# Patient Record
Sex: Female | Born: 1950 | Race: White | Hispanic: No | Smoking: Former smoker
Health system: Southern US, Community
[De-identification: ages and names within clinical notes are randomized; demographics above are authoritative.]

## PROBLEM LIST (undated history)

## (undated) DIAGNOSIS — R0600 Dyspnea, unspecified: Secondary | ICD-10-CM

## (undated) DIAGNOSIS — I341 Nonrheumatic mitral (valve) prolapse: Secondary | ICD-10-CM

## (undated) DIAGNOSIS — I499 Cardiac arrhythmia, unspecified: Secondary | ICD-10-CM

## (undated) DIAGNOSIS — E785 Hyperlipidemia, unspecified: Secondary | ICD-10-CM

## (undated) DIAGNOSIS — I10 Essential (primary) hypertension: Secondary | ICD-10-CM

## (undated) DIAGNOSIS — G4730 Sleep apnea, unspecified: Secondary | ICD-10-CM

## (undated) DIAGNOSIS — E079 Disorder of thyroid, unspecified: Secondary | ICD-10-CM

## (undated) DIAGNOSIS — M1990 Unspecified osteoarthritis, unspecified site: Secondary | ICD-10-CM

## (undated) DIAGNOSIS — S96919A Strain of unspecified muscle and tendon at ankle and foot level, unspecified foot, initial encounter: Secondary | ICD-10-CM

## (undated) DIAGNOSIS — K759 Inflammatory liver disease, unspecified: Secondary | ICD-10-CM

## (undated) DIAGNOSIS — F329 Major depressive disorder, single episode, unspecified: Secondary | ICD-10-CM

## (undated) DIAGNOSIS — I509 Heart failure, unspecified: Secondary | ICD-10-CM

## (undated) DIAGNOSIS — K219 Gastro-esophageal reflux disease without esophagitis: Secondary | ICD-10-CM

## (undated) DIAGNOSIS — F32A Depression, unspecified: Secondary | ICD-10-CM

## (undated) DIAGNOSIS — M797 Fibromyalgia: Secondary | ICD-10-CM

## (undated) HISTORY — DX: Cardiac arrhythmia, unspecified: I49.9

## (undated) HISTORY — DX: Hyperlipidemia, unspecified: E78.5

## (undated) HISTORY — PX: EYE SURGERY: SHX253

## (undated) HISTORY — DX: Heart failure, unspecified: I50.9

## (undated) HISTORY — PX: CHOLECYSTECTOMY: SHX55

## (undated) HISTORY — DX: Essential (primary) hypertension: I10

## (undated) HISTORY — PX: TUBAL LIGATION: SHX77

## (undated) HISTORY — PX: TONSILLECTOMY: SUR1361

## (undated) HISTORY — PX: SHOULDER ARTHROSCOPY: SHX128

## (undated) HISTORY — PX: JOINT REPLACEMENT: SHX530

---

## 1990-08-29 DIAGNOSIS — M797 Fibromyalgia: Secondary | ICD-10-CM

## 1990-08-29 HISTORY — DX: Fibromyalgia: M79.7

## 1998-08-29 HISTORY — PX: FOOT SURGERY: SHX648

## 2011-11-23 DIAGNOSIS — M1710 Unilateral primary osteoarthritis, unspecified knee: Secondary | ICD-10-CM | POA: Insufficient documentation

## 2012-09-10 DIAGNOSIS — H18519 Endothelial corneal dystrophy, unspecified eye: Secondary | ICD-10-CM | POA: Insufficient documentation

## 2013-11-18 DIAGNOSIS — F32A Depression, unspecified: Secondary | ICD-10-CM | POA: Insufficient documentation

## 2013-11-18 DIAGNOSIS — K219 Gastro-esophageal reflux disease without esophagitis: Secondary | ICD-10-CM | POA: Insufficient documentation

## 2014-05-20 ENCOUNTER — Ambulatory Visit: Payer: Self-pay | Admitting: Family Medicine

## 2014-10-04 ENCOUNTER — Ambulatory Visit: Payer: Self-pay | Admitting: Registered Nurse

## 2014-11-10 DIAGNOSIS — I2510 Atherosclerotic heart disease of native coronary artery without angina pectoris: Secondary | ICD-10-CM | POA: Insufficient documentation

## 2015-04-13 ENCOUNTER — Other Ambulatory Visit (HOSPITAL_COMMUNITY): Payer: Self-pay | Admitting: Nurse Practitioner

## 2015-04-13 DIAGNOSIS — I425 Other restrictive cardiomyopathy: Secondary | ICD-10-CM

## 2015-04-16 ENCOUNTER — Inpatient Hospital Stay (HOSPITAL_COMMUNITY): Admission: RE | Admit: 2015-04-16 | Payer: Self-pay | Source: Ambulatory Visit

## 2015-04-21 ENCOUNTER — Ambulatory Visit (HOSPITAL_COMMUNITY)
Admission: RE | Admit: 2015-04-21 | Discharge: 2015-04-21 | Disposition: A | Discharge status: Home / Self Care | Payer: Self-pay | Source: Ambulatory Visit | Attending: Nurse Practitioner | Admitting: Nurse Practitioner

## 2015-04-27 ENCOUNTER — Ambulatory Visit (HOSPITAL_COMMUNITY)
Admission: RE | Admit: 2015-04-27 | Discharge: 2015-04-27 | Disposition: A | Discharge status: Home / Self Care | Payer: PPO | Source: Ambulatory Visit | Attending: Nurse Practitioner | Admitting: Nurse Practitioner

## 2015-04-27 DIAGNOSIS — R002 Palpitations: Secondary | ICD-10-CM | POA: Insufficient documentation

## 2015-04-27 DIAGNOSIS — R0602 Shortness of breath: Secondary | ICD-10-CM | POA: Insufficient documentation

## 2015-04-27 DIAGNOSIS — I425 Other restrictive cardiomyopathy: Secondary | ICD-10-CM | POA: Diagnosis not present

## 2015-04-27 DIAGNOSIS — I081 Rheumatic disorders of both mitral and tricuspid valves: Secondary | ICD-10-CM | POA: Diagnosis not present

## 2015-04-27 LAB — CREATININE, SERUM
CREATININE: 0.55 mg/dL (ref 0.44–1.00)
GFR calc non Af Amer: >60 mL/min (ref >60)

## 2015-04-27 MED ORDER — GADOBENATE DIMEGLUMINE 529 MG/ML IV SOLN
35.0000 mL | Freq: Once | INTRAVENOUS | Status: AC | PRN
  Administered 2015-04-27: 35 mL via INTRAVENOUS

## 2015-06-09 ENCOUNTER — Ambulatory Visit: Payer: PPO | Attending: Specialist

## 2015-06-09 DIAGNOSIS — G4733 Obstructive sleep apnea (adult) (pediatric): Secondary | ICD-10-CM | POA: Diagnosis present

## 2015-06-09 DIAGNOSIS — G4761 Periodic limb movement disorder: Secondary | ICD-10-CM | POA: Insufficient documentation

## 2015-11-04 DIAGNOSIS — I2510 Atherosclerotic heart disease of native coronary artery without angina pectoris: Secondary | ICD-10-CM | POA: Diagnosis not present

## 2015-11-04 DIAGNOSIS — I6523 Occlusion and stenosis of bilateral carotid arteries: Secondary | ICD-10-CM | POA: Diagnosis not present

## 2015-11-04 DIAGNOSIS — G4733 Obstructive sleep apnea (adult) (pediatric): Secondary | ICD-10-CM | POA: Diagnosis not present

## 2015-11-04 DIAGNOSIS — E782 Mixed hyperlipidemia: Secondary | ICD-10-CM | POA: Diagnosis not present

## 2015-11-16 DIAGNOSIS — I6523 Occlusion and stenosis of bilateral carotid arteries: Secondary | ICD-10-CM | POA: Diagnosis not present

## 2015-12-02 DIAGNOSIS — I341 Nonrheumatic mitral (valve) prolapse: Secondary | ICD-10-CM | POA: Diagnosis not present

## 2015-12-02 DIAGNOSIS — G4733 Obstructive sleep apnea (adult) (pediatric): Secondary | ICD-10-CM | POA: Diagnosis not present

## 2015-12-02 DIAGNOSIS — I6523 Occlusion and stenosis of bilateral carotid arteries: Secondary | ICD-10-CM | POA: Diagnosis not present

## 2015-12-02 DIAGNOSIS — R0602 Shortness of breath: Secondary | ICD-10-CM | POA: Diagnosis not present

## 2015-12-02 DIAGNOSIS — R002 Palpitations: Secondary | ICD-10-CM | POA: Diagnosis not present

## 2015-12-02 DIAGNOSIS — I10 Essential (primary) hypertension: Secondary | ICD-10-CM | POA: Diagnosis not present

## 2015-12-02 DIAGNOSIS — I2510 Atherosclerotic heart disease of native coronary artery without angina pectoris: Secondary | ICD-10-CM | POA: Diagnosis not present

## 2015-12-02 DIAGNOSIS — E782 Mixed hyperlipidemia: Secondary | ICD-10-CM | POA: Diagnosis not present

## 2015-12-02 DIAGNOSIS — K219 Gastro-esophageal reflux disease without esophagitis: Secondary | ICD-10-CM | POA: Diagnosis not present

## 2015-12-08 DIAGNOSIS — I272 Other secondary pulmonary hypertension: Secondary | ICD-10-CM | POA: Diagnosis not present

## 2015-12-08 DIAGNOSIS — J449 Chronic obstructive pulmonary disease, unspecified: Secondary | ICD-10-CM | POA: Diagnosis not present

## 2015-12-08 DIAGNOSIS — R0609 Other forms of dyspnea: Secondary | ICD-10-CM | POA: Diagnosis not present

## 2015-12-08 DIAGNOSIS — G4733 Obstructive sleep apnea (adult) (pediatric): Secondary | ICD-10-CM | POA: Diagnosis not present

## 2016-02-16 DIAGNOSIS — E785 Hyperlipidemia, unspecified: Secondary | ICD-10-CM | POA: Diagnosis not present

## 2016-02-16 DIAGNOSIS — I10 Essential (primary) hypertension: Secondary | ICD-10-CM | POA: Diagnosis not present

## 2016-02-16 DIAGNOSIS — E039 Hypothyroidism, unspecified: Secondary | ICD-10-CM | POA: Diagnosis not present

## 2016-02-16 DIAGNOSIS — E559 Vitamin D deficiency, unspecified: Secondary | ICD-10-CM | POA: Diagnosis not present

## 2016-02-17 DIAGNOSIS — E559 Vitamin D deficiency, unspecified: Secondary | ICD-10-CM | POA: Diagnosis not present

## 2016-02-17 DIAGNOSIS — E039 Hypothyroidism, unspecified: Secondary | ICD-10-CM | POA: Diagnosis not present

## 2016-02-17 DIAGNOSIS — E785 Hyperlipidemia, unspecified: Secondary | ICD-10-CM | POA: Diagnosis not present

## 2016-02-17 DIAGNOSIS — I10 Essential (primary) hypertension: Secondary | ICD-10-CM | POA: Diagnosis not present

## 2016-03-24 DIAGNOSIS — M1711 Unilateral primary osteoarthritis, right knee: Secondary | ICD-10-CM | POA: Diagnosis not present

## 2016-03-24 DIAGNOSIS — Z09 Encounter for follow-up examination after completed treatment for conditions other than malignant neoplasm: Secondary | ICD-10-CM | POA: Diagnosis not present

## 2016-03-24 DIAGNOSIS — Z96652 Presence of left artificial knee joint: Secondary | ICD-10-CM | POA: Diagnosis not present

## 2016-03-24 DIAGNOSIS — Z96659 Presence of unspecified artificial knee joint: Secondary | ICD-10-CM | POA: Diagnosis not present

## 2016-05-04 DIAGNOSIS — G4733 Obstructive sleep apnea (adult) (pediatric): Secondary | ICD-10-CM | POA: Diagnosis not present

## 2016-05-04 DIAGNOSIS — R001 Bradycardia, unspecified: Secondary | ICD-10-CM | POA: Diagnosis not present

## 2016-05-04 DIAGNOSIS — I6523 Occlusion and stenosis of bilateral carotid arteries: Secondary | ICD-10-CM | POA: Diagnosis not present

## 2016-05-04 DIAGNOSIS — R079 Chest pain, unspecified: Secondary | ICD-10-CM | POA: Diagnosis not present

## 2016-05-04 DIAGNOSIS — I341 Nonrheumatic mitral (valve) prolapse: Secondary | ICD-10-CM | POA: Diagnosis not present

## 2016-05-04 DIAGNOSIS — I10 Essential (primary) hypertension: Secondary | ICD-10-CM | POA: Diagnosis not present

## 2016-05-04 DIAGNOSIS — K219 Gastro-esophageal reflux disease without esophagitis: Secondary | ICD-10-CM | POA: Diagnosis not present

## 2016-05-04 DIAGNOSIS — I2510 Atherosclerotic heart disease of native coronary artery without angina pectoris: Secondary | ICD-10-CM | POA: Diagnosis not present

## 2016-05-04 DIAGNOSIS — E782 Mixed hyperlipidemia: Secondary | ICD-10-CM | POA: Diagnosis not present

## 2016-06-01 DIAGNOSIS — I6523 Occlusion and stenosis of bilateral carotid arteries: Secondary | ICD-10-CM | POA: Diagnosis not present

## 2016-06-01 DIAGNOSIS — E782 Mixed hyperlipidemia: Secondary | ICD-10-CM | POA: Diagnosis not present

## 2016-06-01 DIAGNOSIS — G4733 Obstructive sleep apnea (adult) (pediatric): Secondary | ICD-10-CM | POA: Diagnosis not present

## 2016-06-01 DIAGNOSIS — R5382 Chronic fatigue, unspecified: Secondary | ICD-10-CM | POA: Diagnosis not present

## 2016-06-01 DIAGNOSIS — I10 Essential (primary) hypertension: Secondary | ICD-10-CM | POA: Diagnosis not present

## 2016-06-01 DIAGNOSIS — I2510 Atherosclerotic heart disease of native coronary artery without angina pectoris: Secondary | ICD-10-CM | POA: Diagnosis not present

## 2016-06-01 DIAGNOSIS — K219 Gastro-esophageal reflux disease without esophagitis: Secondary | ICD-10-CM | POA: Diagnosis not present

## 2016-06-01 DIAGNOSIS — R002 Palpitations: Secondary | ICD-10-CM | POA: Diagnosis not present

## 2016-06-01 DIAGNOSIS — I341 Nonrheumatic mitral (valve) prolapse: Secondary | ICD-10-CM | POA: Diagnosis not present

## 2016-08-01 DIAGNOSIS — M19071 Primary osteoarthritis, right ankle and foot: Secondary | ICD-10-CM | POA: Diagnosis not present

## 2016-08-01 DIAGNOSIS — M7661 Achilles tendinitis, right leg: Secondary | ICD-10-CM | POA: Diagnosis not present

## 2016-08-16 DIAGNOSIS — R0602 Shortness of breath: Secondary | ICD-10-CM | POA: Diagnosis not present

## 2016-08-16 DIAGNOSIS — J309 Allergic rhinitis, unspecified: Secondary | ICD-10-CM | POA: Diagnosis not present

## 2016-08-16 DIAGNOSIS — Z91018 Allergy to other foods: Secondary | ICD-10-CM | POA: Diagnosis not present

## 2016-08-16 DIAGNOSIS — L299 Pruritus, unspecified: Secondary | ICD-10-CM | POA: Diagnosis not present

## 2016-09-07 DIAGNOSIS — I10 Essential (primary) hypertension: Secondary | ICD-10-CM | POA: Diagnosis not present

## 2016-09-07 DIAGNOSIS — I341 Nonrheumatic mitral (valve) prolapse: Secondary | ICD-10-CM | POA: Diagnosis not present

## 2016-09-07 DIAGNOSIS — R0602 Shortness of breath: Secondary | ICD-10-CM | POA: Diagnosis not present

## 2016-09-07 DIAGNOSIS — K219 Gastro-esophageal reflux disease without esophagitis: Secondary | ICD-10-CM | POA: Diagnosis not present

## 2016-09-07 DIAGNOSIS — R5382 Chronic fatigue, unspecified: Secondary | ICD-10-CM | POA: Diagnosis not present

## 2016-09-07 DIAGNOSIS — I2510 Atherosclerotic heart disease of native coronary artery without angina pectoris: Secondary | ICD-10-CM | POA: Diagnosis not present

## 2016-09-07 DIAGNOSIS — E782 Mixed hyperlipidemia: Secondary | ICD-10-CM | POA: Diagnosis not present

## 2016-09-07 DIAGNOSIS — I6523 Occlusion and stenosis of bilateral carotid arteries: Secondary | ICD-10-CM | POA: Diagnosis not present

## 2016-09-07 DIAGNOSIS — R002 Palpitations: Secondary | ICD-10-CM | POA: Diagnosis not present

## 2016-10-10 DIAGNOSIS — I6523 Occlusion and stenosis of bilateral carotid arteries: Secondary | ICD-10-CM | POA: Diagnosis not present

## 2016-10-10 DIAGNOSIS — E782 Mixed hyperlipidemia: Secondary | ICD-10-CM | POA: Diagnosis not present

## 2016-10-10 DIAGNOSIS — K219 Gastro-esophageal reflux disease without esophagitis: Secondary | ICD-10-CM | POA: Diagnosis not present

## 2016-10-10 DIAGNOSIS — G4733 Obstructive sleep apnea (adult) (pediatric): Secondary | ICD-10-CM | POA: Diagnosis not present

## 2016-10-10 DIAGNOSIS — R002 Palpitations: Secondary | ICD-10-CM | POA: Diagnosis not present

## 2016-10-10 DIAGNOSIS — I2510 Atherosclerotic heart disease of native coronary artery without angina pectoris: Secondary | ICD-10-CM | POA: Diagnosis not present

## 2016-10-10 DIAGNOSIS — I341 Nonrheumatic mitral (valve) prolapse: Secondary | ICD-10-CM | POA: Diagnosis not present

## 2016-10-10 DIAGNOSIS — I10 Essential (primary) hypertension: Secondary | ICD-10-CM | POA: Diagnosis not present

## 2016-10-10 DIAGNOSIS — R0602 Shortness of breath: Secondary | ICD-10-CM | POA: Diagnosis not present

## 2016-10-13 DIAGNOSIS — G4733 Obstructive sleep apnea (adult) (pediatric): Secondary | ICD-10-CM | POA: Diagnosis not present

## 2016-10-21 DIAGNOSIS — M79671 Pain in right foot: Secondary | ICD-10-CM | POA: Diagnosis not present

## 2016-10-31 DIAGNOSIS — M79605 Pain in left leg: Secondary | ICD-10-CM | POA: Diagnosis not present

## 2016-11-08 DIAGNOSIS — M9904 Segmental and somatic dysfunction of sacral region: Secondary | ICD-10-CM | POA: Diagnosis not present

## 2016-11-08 DIAGNOSIS — M4126 Other idiopathic scoliosis, lumbar region: Secondary | ICD-10-CM | POA: Diagnosis not present

## 2016-11-08 DIAGNOSIS — M5432 Sciatica, left side: Secondary | ICD-10-CM | POA: Diagnosis not present

## 2016-11-08 DIAGNOSIS — M62830 Muscle spasm of back: Secondary | ICD-10-CM | POA: Diagnosis not present

## 2016-11-08 DIAGNOSIS — M9903 Segmental and somatic dysfunction of lumbar region: Secondary | ICD-10-CM | POA: Diagnosis not present

## 2016-11-10 DIAGNOSIS — M9903 Segmental and somatic dysfunction of lumbar region: Secondary | ICD-10-CM | POA: Diagnosis not present

## 2016-11-10 DIAGNOSIS — M5432 Sciatica, left side: Secondary | ICD-10-CM | POA: Diagnosis not present

## 2016-11-10 DIAGNOSIS — M9904 Segmental and somatic dysfunction of sacral region: Secondary | ICD-10-CM | POA: Diagnosis not present

## 2016-11-10 DIAGNOSIS — M62830 Muscle spasm of back: Secondary | ICD-10-CM | POA: Diagnosis not present

## 2016-11-10 DIAGNOSIS — M4126 Other idiopathic scoliosis, lumbar region: Secondary | ICD-10-CM | POA: Diagnosis not present

## 2016-11-14 DIAGNOSIS — M9904 Segmental and somatic dysfunction of sacral region: Secondary | ICD-10-CM | POA: Diagnosis not present

## 2016-11-14 DIAGNOSIS — M62830 Muscle spasm of back: Secondary | ICD-10-CM | POA: Diagnosis not present

## 2016-11-14 DIAGNOSIS — M5432 Sciatica, left side: Secondary | ICD-10-CM | POA: Diagnosis not present

## 2016-11-14 DIAGNOSIS — M4126 Other idiopathic scoliosis, lumbar region: Secondary | ICD-10-CM | POA: Diagnosis not present

## 2016-11-14 DIAGNOSIS — M9903 Segmental and somatic dysfunction of lumbar region: Secondary | ICD-10-CM | POA: Diagnosis not present

## 2016-11-16 DIAGNOSIS — M9904 Segmental and somatic dysfunction of sacral region: Secondary | ICD-10-CM | POA: Diagnosis not present

## 2016-11-16 DIAGNOSIS — M5432 Sciatica, left side: Secondary | ICD-10-CM | POA: Diagnosis not present

## 2016-11-16 DIAGNOSIS — M9903 Segmental and somatic dysfunction of lumbar region: Secondary | ICD-10-CM | POA: Diagnosis not present

## 2016-11-16 DIAGNOSIS — M4126 Other idiopathic scoliosis, lumbar region: Secondary | ICD-10-CM | POA: Diagnosis not present

## 2016-11-16 DIAGNOSIS — M62830 Muscle spasm of back: Secondary | ICD-10-CM | POA: Diagnosis not present

## 2016-11-17 DIAGNOSIS — M9903 Segmental and somatic dysfunction of lumbar region: Secondary | ICD-10-CM | POA: Diagnosis not present

## 2016-11-17 DIAGNOSIS — M9904 Segmental and somatic dysfunction of sacral region: Secondary | ICD-10-CM | POA: Diagnosis not present

## 2016-11-17 DIAGNOSIS — M4126 Other idiopathic scoliosis, lumbar region: Secondary | ICD-10-CM | POA: Diagnosis not present

## 2016-11-17 DIAGNOSIS — M62830 Muscle spasm of back: Secondary | ICD-10-CM | POA: Diagnosis not present

## 2016-11-17 DIAGNOSIS — M5432 Sciatica, left side: Secondary | ICD-10-CM | POA: Diagnosis not present

## 2016-11-21 DIAGNOSIS — M5432 Sciatica, left side: Secondary | ICD-10-CM | POA: Diagnosis not present

## 2016-11-21 DIAGNOSIS — M9904 Segmental and somatic dysfunction of sacral region: Secondary | ICD-10-CM | POA: Diagnosis not present

## 2016-11-21 DIAGNOSIS — M62830 Muscle spasm of back: Secondary | ICD-10-CM | POA: Diagnosis not present

## 2016-11-21 DIAGNOSIS — M4126 Other idiopathic scoliosis, lumbar region: Secondary | ICD-10-CM | POA: Diagnosis not present

## 2016-11-21 DIAGNOSIS — M9903 Segmental and somatic dysfunction of lumbar region: Secondary | ICD-10-CM | POA: Diagnosis not present

## 2016-11-23 DIAGNOSIS — M62830 Muscle spasm of back: Secondary | ICD-10-CM | POA: Diagnosis not present

## 2016-11-23 DIAGNOSIS — M4126 Other idiopathic scoliosis, lumbar region: Secondary | ICD-10-CM | POA: Diagnosis not present

## 2016-11-23 DIAGNOSIS — M9903 Segmental and somatic dysfunction of lumbar region: Secondary | ICD-10-CM | POA: Diagnosis not present

## 2016-11-23 DIAGNOSIS — M9902 Segmental and somatic dysfunction of thoracic region: Secondary | ICD-10-CM | POA: Diagnosis not present

## 2016-11-23 DIAGNOSIS — M9904 Segmental and somatic dysfunction of sacral region: Secondary | ICD-10-CM | POA: Diagnosis not present

## 2016-11-23 DIAGNOSIS — M5432 Sciatica, left side: Secondary | ICD-10-CM | POA: Diagnosis not present

## 2016-11-25 DIAGNOSIS — M4126 Other idiopathic scoliosis, lumbar region: Secondary | ICD-10-CM | POA: Diagnosis not present

## 2016-11-25 DIAGNOSIS — M9903 Segmental and somatic dysfunction of lumbar region: Secondary | ICD-10-CM | POA: Diagnosis not present

## 2016-11-25 DIAGNOSIS — M9904 Segmental and somatic dysfunction of sacral region: Secondary | ICD-10-CM | POA: Diagnosis not present

## 2016-11-25 DIAGNOSIS — M5432 Sciatica, left side: Secondary | ICD-10-CM | POA: Diagnosis not present

## 2016-11-25 DIAGNOSIS — M62830 Muscle spasm of back: Secondary | ICD-10-CM | POA: Diagnosis not present

## 2016-11-29 DIAGNOSIS — M4126 Other idiopathic scoliosis, lumbar region: Secondary | ICD-10-CM | POA: Diagnosis not present

## 2016-11-29 DIAGNOSIS — M5432 Sciatica, left side: Secondary | ICD-10-CM | POA: Diagnosis not present

## 2016-11-29 DIAGNOSIS — M62830 Muscle spasm of back: Secondary | ICD-10-CM | POA: Diagnosis not present

## 2016-11-29 DIAGNOSIS — M9904 Segmental and somatic dysfunction of sacral region: Secondary | ICD-10-CM | POA: Diagnosis not present

## 2016-11-29 DIAGNOSIS — M9903 Segmental and somatic dysfunction of lumbar region: Secondary | ICD-10-CM | POA: Diagnosis not present

## 2016-12-15 DIAGNOSIS — M9904 Segmental and somatic dysfunction of sacral region: Secondary | ICD-10-CM | POA: Diagnosis not present

## 2016-12-15 DIAGNOSIS — M9903 Segmental and somatic dysfunction of lumbar region: Secondary | ICD-10-CM | POA: Diagnosis not present

## 2016-12-15 DIAGNOSIS — M5432 Sciatica, left side: Secondary | ICD-10-CM | POA: Diagnosis not present

## 2016-12-15 DIAGNOSIS — M62830 Muscle spasm of back: Secondary | ICD-10-CM | POA: Diagnosis not present

## 2016-12-15 DIAGNOSIS — M4126 Other idiopathic scoliosis, lumbar region: Secondary | ICD-10-CM | POA: Diagnosis not present

## 2016-12-22 DIAGNOSIS — M9903 Segmental and somatic dysfunction of lumbar region: Secondary | ICD-10-CM | POA: Diagnosis not present

## 2016-12-22 DIAGNOSIS — M9904 Segmental and somatic dysfunction of sacral region: Secondary | ICD-10-CM | POA: Diagnosis not present

## 2016-12-22 DIAGNOSIS — M5432 Sciatica, left side: Secondary | ICD-10-CM | POA: Diagnosis not present

## 2016-12-22 DIAGNOSIS — M62830 Muscle spasm of back: Secondary | ICD-10-CM | POA: Diagnosis not present

## 2016-12-22 DIAGNOSIS — M4126 Other idiopathic scoliosis, lumbar region: Secondary | ICD-10-CM | POA: Diagnosis not present

## 2017-02-02 DIAGNOSIS — M5432 Sciatica, left side: Secondary | ICD-10-CM | POA: Diagnosis not present

## 2017-02-02 DIAGNOSIS — M4126 Other idiopathic scoliosis, lumbar region: Secondary | ICD-10-CM | POA: Diagnosis not present

## 2017-02-02 DIAGNOSIS — M62830 Muscle spasm of back: Secondary | ICD-10-CM | POA: Diagnosis not present

## 2017-02-02 DIAGNOSIS — M9903 Segmental and somatic dysfunction of lumbar region: Secondary | ICD-10-CM | POA: Diagnosis not present

## 2017-02-02 DIAGNOSIS — M9904 Segmental and somatic dysfunction of sacral region: Secondary | ICD-10-CM | POA: Diagnosis not present

## 2017-02-15 DIAGNOSIS — M9903 Segmental and somatic dysfunction of lumbar region: Secondary | ICD-10-CM | POA: Diagnosis not present

## 2017-02-15 DIAGNOSIS — M4126 Other idiopathic scoliosis, lumbar region: Secondary | ICD-10-CM | POA: Diagnosis not present

## 2017-02-15 DIAGNOSIS — M62830 Muscle spasm of back: Secondary | ICD-10-CM | POA: Diagnosis not present

## 2017-02-15 DIAGNOSIS — M5432 Sciatica, left side: Secondary | ICD-10-CM | POA: Diagnosis not present

## 2017-02-15 DIAGNOSIS — M9904 Segmental and somatic dysfunction of sacral region: Secondary | ICD-10-CM | POA: Diagnosis not present

## 2017-02-17 DIAGNOSIS — R197 Diarrhea, unspecified: Secondary | ICD-10-CM | POA: Diagnosis not present

## 2017-02-17 DIAGNOSIS — K602 Anal fissure, unspecified: Secondary | ICD-10-CM | POA: Diagnosis not present

## 2017-02-20 DIAGNOSIS — R197 Diarrhea, unspecified: Secondary | ICD-10-CM | POA: Diagnosis not present

## 2017-04-07 ENCOUNTER — Other Ambulatory Visit: Payer: Self-pay | Admitting: Otolaryngology

## 2017-04-07 DIAGNOSIS — E049 Nontoxic goiter, unspecified: Secondary | ICD-10-CM

## 2017-04-07 DIAGNOSIS — H6121 Impacted cerumen, right ear: Secondary | ICD-10-CM | POA: Diagnosis not present

## 2017-04-07 DIAGNOSIS — E041 Nontoxic single thyroid nodule: Secondary | ICD-10-CM | POA: Diagnosis not present

## 2017-04-13 ENCOUNTER — Ambulatory Visit
Admission: RE | Admit: 2017-04-13 | Discharge: 2017-04-13 | Disposition: A | Discharge status: Home / Self Care | Payer: PPO | Source: Ambulatory Visit | Attending: Otolaryngology | Admitting: Otolaryngology

## 2017-04-13 DIAGNOSIS — E049 Nontoxic goiter, unspecified: Secondary | ICD-10-CM | POA: Diagnosis present

## 2017-04-13 DIAGNOSIS — E042 Nontoxic multinodular goiter: Secondary | ICD-10-CM | POA: Diagnosis not present

## 2017-04-25 DIAGNOSIS — E041 Nontoxic single thyroid nodule: Secondary | ICD-10-CM | POA: Diagnosis not present

## 2017-04-26 ENCOUNTER — Other Ambulatory Visit: Payer: Self-pay | Admitting: Otolaryngology

## 2017-04-26 DIAGNOSIS — E041 Nontoxic single thyroid nodule: Secondary | ICD-10-CM

## 2017-04-27 ENCOUNTER — Ambulatory Visit: Admission: RE | Admit: 2017-04-27 | Payer: PPO | Source: Ambulatory Visit

## 2017-05-04 ENCOUNTER — Ambulatory Visit
Admission: RE | Admit: 2017-05-04 | Discharge: 2017-05-04 | Disposition: A | Discharge status: Home / Self Care | Payer: PPO | Source: Ambulatory Visit | Attending: Otolaryngology | Admitting: Otolaryngology

## 2017-05-04 ENCOUNTER — Other Ambulatory Visit: Payer: Self-pay | Admitting: Otolaryngology

## 2017-05-04 DIAGNOSIS — E041 Nontoxic single thyroid nodule: Secondary | ICD-10-CM

## 2017-05-04 HISTORY — DX: Gastro-esophageal reflux disease without esophagitis: K21.9

## 2017-05-04 HISTORY — DX: Disorder of thyroid, unspecified: E07.9

## 2017-05-04 NOTE — Procedures (Signed)
4cm LEFT MID THYROID TR3 NODULE  S/P [place] FNA  NO COMP STABLE PATH PENDING FULL REPORT IN PACS

## 2017-05-05 LAB — CYTOLOGY - NON PAP

## 2017-05-08 DIAGNOSIS — E041 Nontoxic single thyroid nodule: Secondary | ICD-10-CM | POA: Diagnosis not present

## 2017-05-08 DIAGNOSIS — K219 Gastro-esophageal reflux disease without esophagitis: Secondary | ICD-10-CM | POA: Diagnosis not present

## 2017-05-23 DIAGNOSIS — G4733 Obstructive sleep apnea (adult) (pediatric): Secondary | ICD-10-CM | POA: Diagnosis not present

## 2017-05-23 DIAGNOSIS — R0602 Shortness of breath: Secondary | ICD-10-CM | POA: Diagnosis not present

## 2017-05-23 DIAGNOSIS — I2510 Atherosclerotic heart disease of native coronary artery without angina pectoris: Secondary | ICD-10-CM | POA: Diagnosis not present

## 2017-05-23 DIAGNOSIS — I6523 Occlusion and stenosis of bilateral carotid arteries: Secondary | ICD-10-CM | POA: Diagnosis not present

## 2017-05-23 DIAGNOSIS — I10 Essential (primary) hypertension: Secondary | ICD-10-CM | POA: Diagnosis not present

## 2017-05-23 DIAGNOSIS — E782 Mixed hyperlipidemia: Secondary | ICD-10-CM | POA: Diagnosis not present

## 2017-05-31 DIAGNOSIS — H52223 Regular astigmatism, bilateral: Secondary | ICD-10-CM | POA: Diagnosis not present

## 2017-05-31 DIAGNOSIS — H524 Presbyopia: Secondary | ICD-10-CM | POA: Diagnosis not present

## 2017-05-31 DIAGNOSIS — H26493 Other secondary cataract, bilateral: Secondary | ICD-10-CM | POA: Diagnosis not present

## 2017-05-31 DIAGNOSIS — H35363 Drusen (degenerative) of macula, bilateral: Secondary | ICD-10-CM | POA: Diagnosis not present

## 2017-05-31 DIAGNOSIS — H3530 Unspecified macular degeneration: Secondary | ICD-10-CM | POA: Diagnosis not present

## 2017-05-31 DIAGNOSIS — H5213 Myopia, bilateral: Secondary | ICD-10-CM | POA: Diagnosis not present

## 2017-06-01 DIAGNOSIS — G4733 Obstructive sleep apnea (adult) (pediatric): Secondary | ICD-10-CM | POA: Diagnosis not present

## 2017-06-01 DIAGNOSIS — I272 Pulmonary hypertension, unspecified: Secondary | ICD-10-CM | POA: Diagnosis not present

## 2017-06-01 DIAGNOSIS — R0609 Other forms of dyspnea: Secondary | ICD-10-CM | POA: Diagnosis not present

## 2017-06-01 DIAGNOSIS — R0602 Shortness of breath: Secondary | ICD-10-CM | POA: Diagnosis not present

## 2017-06-01 DIAGNOSIS — Z01818 Encounter for other preprocedural examination: Secondary | ICD-10-CM | POA: Diagnosis not present

## 2017-06-05 DIAGNOSIS — I272 Pulmonary hypertension, unspecified: Secondary | ICD-10-CM | POA: Diagnosis not present

## 2017-06-05 DIAGNOSIS — R0609 Other forms of dyspnea: Secondary | ICD-10-CM | POA: Diagnosis not present

## 2017-06-05 DIAGNOSIS — Z01818 Encounter for other preprocedural examination: Secondary | ICD-10-CM | POA: Diagnosis not present

## 2017-06-08 ENCOUNTER — Other Ambulatory Visit: Payer: PPO

## 2017-06-08 ENCOUNTER — Encounter
Admission: RE | Admit: 2017-06-08 | Discharge: 2017-06-08 | Disposition: A | Discharge status: Home / Self Care | Payer: PPO | Source: Ambulatory Visit | Attending: Otolaryngology | Admitting: Otolaryngology

## 2017-06-08 DIAGNOSIS — K219 Gastro-esophageal reflux disease without esophagitis: Secondary | ICD-10-CM | POA: Insufficient documentation

## 2017-06-08 DIAGNOSIS — I341 Nonrheumatic mitral (valve) prolapse: Secondary | ICD-10-CM | POA: Diagnosis not present

## 2017-06-08 DIAGNOSIS — Z01812 Encounter for preprocedural laboratory examination: Secondary | ICD-10-CM | POA: Diagnosis not present

## 2017-06-08 DIAGNOSIS — E079 Disorder of thyroid, unspecified: Secondary | ICD-10-CM | POA: Insufficient documentation

## 2017-06-08 HISTORY — DX: Nonrheumatic mitral (valve) prolapse: I34.1

## 2017-06-08 HISTORY — DX: Unspecified osteoarthritis, unspecified site: M19.90

## 2017-06-08 HISTORY — DX: Cardiac arrhythmia, unspecified: I49.9

## 2017-06-08 HISTORY — DX: Depression, unspecified: F32.A

## 2017-06-08 HISTORY — DX: Inflammatory liver disease, unspecified: K75.9

## 2017-06-08 HISTORY — DX: Dyspnea, unspecified: R06.00

## 2017-06-08 HISTORY — DX: Sleep apnea, unspecified: G47.30

## 2017-06-08 HISTORY — DX: Major depressive disorder, single episode, unspecified: F32.9

## 2017-06-08 LAB — BASIC METABOLIC PANEL
ANION GAP: 6 (ref 5–15)
BUN: 14 mg/dL (ref 6–20)
CALCIUM: 10 mg/dL (ref 8.9–10.3)
CO2: 30 mmol/L (ref 22–32)
CREATININE: 0.57 mg/dL (ref 0.44–1.00)
Chloride: 106 mmol/L (ref 101–111)
GFR calc Af Amer: >60 mL/min (ref >60)
GFR calc non Af Amer: >60 mL/min (ref >60)
GLUCOSE: 100 mg/dL — AB (ref 65–99)
Potassium: 4.5 mmol/L (ref 3.5–5.1)
Sodium: 142 mmol/L (ref 135–145)

## 2017-06-08 NOTE — Patient Instructions (Signed)
Your procedure is scheduled on: Thurs. 06/15/17 Report to Day Surgery. To find out your arrival time please call [phone] between 1PM - 3PM on Wed. 06/14/17.  Remember: Instructions that are not followed completely may result in serious medical risk, up to and including death, or upon the discretion of your surgeon and anesthesiologist your surgery may need to be rescheduled.     _X__ 1. Do not eat food after midnight the night before your procedure.                 No gum chewing or hard candies. You may drink clear liquids up to 2 hours                 before you are scheduled to arrive for your surgery- DO not drink clear                 liquids within 2 hours of the start of your surgery.                 Clear Liquids include:  water, apple juice without pulp, clear carbohydrate                 drink such as Clearfast of Gartorade, Black Coffee or Tea (Do not add                 anything to coffee or tea).     _X__ 2.  No Alcohol for 24 hours before or after surgery.   __ 3.  Do Not Smoke or use e-cigarettes For 24 Hours Prior to Your Surgery.                 Do not use any chewable tobacco products for at least 6 hours prior to                 surgery.  ____  4.  Bring all medications with you on the day of surgery if instructed.   ____  5.  Notify your doctor if there is any change in your medical condition      (cold, fever, infections).     Do not wear jewelry, make-up, hairpins, clips or nail polish. Do not wear lotions, powders, or perfumes. You may wear deodorant. Do not shave 48 hours prior to surgery. Men may shave face and neck. Do not bring valuables to the hospital.    [facility] is not responsible for any belongings or valuables.  Contacts, dentures or bridgework may not be worn into surgery. Leave your suitcase in the car. After surgery it may be brought to your room. For patients admitted to the hospital, discharge time is determined  by your treatment team.   Patients discharged the day of surgery will not be allowed to drive home.   Please read over the following fact sheets that you were given:    __x__ Take these medicines the morning of surgery with A SIP OF WATER:    1.atorvastatin (LIPITOR) 10 MG tablet  2.buPROPion (WELLBUTRIN XL) 150 MG 24 hr tablet   3.levothyroxine (SYNTHROID, LEVOTHROID) 75 MCG tablet  4.ranitidine (ZANTAC) 150 MG tablet  5.  6.  ____ Fleet Enema (as directed)   __x__ Use CHG Soap as directed  ____ Use inhalers on the day of surgery  ____ Stop metformin 2 days prior to surgery    ____ Take 1/2 of usual insulin dose the night before surgery. No insulin the morning  of surgery.   __x__ Stop aspirin on today  __x__ Stop Anti-inflammatories on today naproxen sodium (ANAPROX) 220 MG tablet   __x__ Stop supplements until after surgery.  vitamin E (VITAMIN E) 400 UNIT capsule,KRILL OIL PO  __x__ Bring C-Pap to the hospital.

## 2017-08-01 DIAGNOSIS — E041 Nontoxic single thyroid nodule: Secondary | ICD-10-CM | POA: Diagnosis not present

## 2017-08-03 ENCOUNTER — Observation Stay
Admission: RE | Admit: 2017-08-03 | Discharge: 2017-08-04 | Disposition: A | Discharge status: Home / Self Care | Payer: PPO | Source: Ambulatory Visit | Attending: Otolaryngology | Admitting: Otolaryngology

## 2017-08-03 ENCOUNTER — Encounter
Admission: RE | Disposition: A | Discharge status: Home / Self Care | Payer: Self-pay | Source: Ambulatory Visit | Attending: Otolaryngology

## 2017-08-03 ENCOUNTER — Encounter: Payer: Self-pay | Admitting: *Deleted

## 2017-08-03 ENCOUNTER — Ambulatory Visit: Payer: PPO | Admitting: Anesthesiology

## 2017-08-03 ENCOUNTER — Other Ambulatory Visit: Payer: Self-pay

## 2017-08-03 DIAGNOSIS — E063 Autoimmune thyroiditis: Secondary | ICD-10-CM | POA: Diagnosis not present

## 2017-08-03 DIAGNOSIS — M1990 Unspecified osteoarthritis, unspecified site: Secondary | ICD-10-CM | POA: Diagnosis not present

## 2017-08-03 DIAGNOSIS — I739 Peripheral vascular disease, unspecified: Secondary | ICD-10-CM | POA: Diagnosis not present

## 2017-08-03 DIAGNOSIS — E041 Nontoxic single thyroid nodule: Secondary | ICD-10-CM | POA: Diagnosis not present

## 2017-08-03 DIAGNOSIS — E890 Postprocedural hypothyroidism: Secondary | ICD-10-CM

## 2017-08-03 DIAGNOSIS — D351 Benign neoplasm of parathyroid gland: Secondary | ICD-10-CM | POA: Diagnosis not present

## 2017-08-03 DIAGNOSIS — E048 Other specified nontoxic goiter: Secondary | ICD-10-CM | POA: Diagnosis not present

## 2017-08-03 DIAGNOSIS — Z9889 Other specified postprocedural states: Secondary | ICD-10-CM

## 2017-08-03 DIAGNOSIS — E042 Nontoxic multinodular goiter: Principal | ICD-10-CM | POA: Insufficient documentation

## 2017-08-03 DIAGNOSIS — I2510 Atherosclerotic heart disease of native coronary artery without angina pectoris: Secondary | ICD-10-CM | POA: Diagnosis not present

## 2017-08-03 DIAGNOSIS — G4730 Sleep apnea, unspecified: Secondary | ICD-10-CM | POA: Diagnosis not present

## 2017-08-03 DIAGNOSIS — F329 Major depressive disorder, single episode, unspecified: Secondary | ICD-10-CM | POA: Diagnosis not present

## 2017-08-03 DIAGNOSIS — E065 Other chronic thyroiditis: Secondary | ICD-10-CM | POA: Diagnosis not present

## 2017-08-03 HISTORY — PX: CONTINUOUS NERVE MONITORING: SHX6650

## 2017-08-03 HISTORY — PX: THYROIDECTOMY: SHX17

## 2017-08-03 LAB — CALCIUM
CALCIUM: 9.5 mg/dL (ref 8.9–10.3)
CALCIUM: 8.5 mg/dL — AB (ref 8.9–10.3)

## 2017-08-03 LAB — MAGNESIUM: MAGNESIUM: 1.8 mg/dL (ref 1.7–2.4)

## 2017-08-03 LAB — ALBUMIN: Albumin: 3.9 g/dL (ref 3.5–5.0)

## 2017-08-03 SURGERY — THYROIDECTOMY
Anesthesia: General | Wound class: Clean

## 2017-08-03 MED ORDER — FUROSEMIDE 20 MG PO TABS
20.0000 mg | ORAL_TABLET | Freq: Every day | ORAL | Status: DC
  Administered 2017-08-04: 20 mg via ORAL
  Filled 2017-08-03 (×2): qty 1

## 2017-08-03 MED ORDER — ONDANSETRON HCL 4 MG/2ML IJ SOLN
INTRAMUSCULAR | Status: DC | PRN
  Administered 2017-08-03: 4 mg via INTRAVENOUS

## 2017-08-03 MED ORDER — MIDAZOLAM HCL 2 MG/2ML IJ SOLN
INTRAMUSCULAR | Status: DC | PRN
  Administered 2017-08-03: 2 mg via INTRAVENOUS

## 2017-08-03 MED ORDER — ONDANSETRON HCL 4 MG/2ML IJ SOLN: 4.0000 mg | INTRAMUSCULAR | Status: DC | PRN

## 2017-08-03 MED ORDER — LACTATED RINGERS IV SOLN
INTRAVENOUS | Status: DC
  Administered 2017-08-03: 07:00:00 via INTRAVENOUS

## 2017-08-03 MED ORDER — CALCIUM CARBONATE ANTACID 500 MG PO CHEW
400.0000 mg | CHEWABLE_TABLET | Freq: Three times a day (TID) | ORAL | Status: DC
  Administered 2017-08-03 (×2): 400 mg via ORAL
  Filled 2017-08-03 (×3): qty 2

## 2017-08-03 MED ORDER — SUCCINYLCHOLINE CHLORIDE 20 MG/ML IJ SOLN
INTRAMUSCULAR | Status: DC | PRN
  Administered 2017-08-03: 100 mg via INTRAVENOUS

## 2017-08-03 MED ORDER — ATORVASTATIN CALCIUM 10 MG PO TABS
10.0000 mg | ORAL_TABLET | Freq: Every day | ORAL | Status: DC
  Administered 2017-08-04: 10 mg via ORAL
  Filled 2017-08-03: qty 1

## 2017-08-03 MED ORDER — DEXAMETHASONE SODIUM PHOSPHATE 10 MG/ML IJ SOLN
INTRAMUSCULAR | Status: AC
  Filled 2017-08-03: qty 1

## 2017-08-03 MED ORDER — ACETAMINOPHEN 10 MG/ML IV SOLN
INTRAVENOUS | Status: AC
  Filled 2017-08-03: qty 100

## 2017-08-03 MED ORDER — PROPOFOL 10 MG/ML IV BOLUS
INTRAVENOUS | Status: DC | PRN
  Administered 2017-08-03: 160 mg via INTRAVENOUS

## 2017-08-03 MED ORDER — ZOLPIDEM TARTRATE 5 MG PO TABS
5.0000 mg | ORAL_TABLET | Freq: Every evening | ORAL | Status: DC | PRN
Start: 2017-08-03 — End: 2017-08-04

## 2017-08-03 MED ORDER — FENTANYL CITRATE (PF) 100 MCG/2ML IJ SOLN
INTRAMUSCULAR | Status: DC | PRN
  Administered 2017-08-03 (×2): 50 ug via INTRAVENOUS
  Administered 2017-08-03: 150 ug via INTRAVENOUS

## 2017-08-03 MED ORDER — SUCCINYLCHOLINE CHLORIDE 20 MG/ML IJ SOLN
INTRAMUSCULAR | Status: AC
  Filled 2017-08-03: qty 1

## 2017-08-03 MED ORDER — ACETAMINOPHEN 650 MG RE SUPP: 650.0000 mg | RECTAL | Status: DC | PRN

## 2017-08-03 MED ORDER — BACITRACIN ZINC 500 UNIT/GM EX OINT
TOPICAL_OINTMENT | CUTANEOUS | Status: AC
  Filled 2017-08-03: qty 28.35

## 2017-08-03 MED ORDER — ACETAMINOPHEN 10 MG/ML IV SOLN
INTRAVENOUS | Status: DC | PRN
  Administered 2017-08-03: 1000 mg via INTRAVENOUS

## 2017-08-03 MED ORDER — ONDANSETRON HCL 4 MG/2ML IJ SOLN: 4.0000 mg | Freq: Once | INTRAMUSCULAR | Status: DC | PRN

## 2017-08-03 MED ORDER — FAMOTIDINE 20 MG PO TABS
20.0000 mg | ORAL_TABLET | Freq: Two times a day (BID) | ORAL | Status: DC
  Administered 2017-08-03 – 2017-08-04 (×2): 20 mg via ORAL
  Filled 2017-08-03 (×2): qty 1

## 2017-08-03 MED ORDER — MIDAZOLAM HCL 2 MG/2ML IJ SOLN
INTRAMUSCULAR | Status: AC
  Filled 2017-08-03: qty 2

## 2017-08-03 MED ORDER — PROPOFOL 10 MG/ML IV BOLUS
INTRAVENOUS | Status: AC
  Filled 2017-08-03: qty 20

## 2017-08-03 MED ORDER — CYCLOSPORINE 0.05 % OP EMUL
2.0000 [drp] | Freq: Every day | OPHTHALMIC | Status: DC | PRN
  Filled 2017-08-03: qty 1

## 2017-08-03 MED ORDER — DEXTROSE-NACL 5-0.45 % IV SOLN
INTRAVENOUS | Status: DC
  Administered 2017-08-03: 12:00:00 via INTRAVENOUS

## 2017-08-03 MED ORDER — LIDOCAINE HCL (CARDIAC) 20 MG/ML IV SOLN
INTRAVENOUS | Status: DC | PRN
  Administered 2017-08-03: 100 mg via INTRAVENOUS

## 2017-08-03 MED ORDER — HYDROMORPHONE HCL 1 MG/ML IJ SOLN
INTRAMUSCULAR | Status: AC
  Filled 2017-08-03: qty 1

## 2017-08-03 MED ORDER — ONDANSETRON HCL 4 MG PO TABS: 4.0000 mg | ORAL_TABLET | ORAL | Status: DC | PRN

## 2017-08-03 MED ORDER — BUPROPION HCL ER (XL) 150 MG PO TB24
150.0000 mg | ORAL_TABLET | Freq: Every day | ORAL | Status: DC
  Administered 2017-08-04: 150 mg via ORAL
  Filled 2017-08-03: qty 1

## 2017-08-03 MED ORDER — BUPIVACAINE-EPINEPHRINE (PF) 0.25% -1:200000 IJ SOLN
INTRAMUSCULAR | Status: DC | PRN
  Administered 2017-08-03: 7 mL via PERINEURAL

## 2017-08-03 MED ORDER — MAGNESIUM OXIDE 400 (241.3 MG) MG PO TABS
400.0000 mg | ORAL_TABLET | Freq: Two times a day (BID) | ORAL | Status: DC
  Administered 2017-08-03 – 2017-08-04 (×3): 400 mg via ORAL
  Filled 2017-08-03 (×3): qty 1

## 2017-08-03 MED ORDER — FENTANYL CITRATE (PF) 100 MCG/2ML IJ SOLN: 25.0000 ug | INTRAMUSCULAR | Status: DC | PRN

## 2017-08-03 MED ORDER — HYDROMORPHONE HCL 1 MG/ML IJ SOLN
INTRAMUSCULAR | Status: DC | PRN
  Administered 2017-08-03: .25 mg via INTRAVENOUS

## 2017-08-03 MED ORDER — LIDOCAINE HCL (PF) 2 % IJ SOLN
INTRAMUSCULAR | Status: AC
  Filled 2017-08-03: qty 10

## 2017-08-03 MED ORDER — DEXAMETHASONE SODIUM PHOSPHATE 10 MG/ML IJ SOLN
INTRAMUSCULAR | Status: DC | PRN
  Administered 2017-08-03: 10 mg via INTRAVENOUS

## 2017-08-03 MED ORDER — BUPIVACAINE-EPINEPHRINE (PF) 0.25% -1:200000 IJ SOLN
INTRAMUSCULAR | Status: AC
  Filled 2017-08-03: qty 30

## 2017-08-03 MED ORDER — ONDANSETRON HCL 4 MG/2ML IJ SOLN
INTRAMUSCULAR | Status: AC
  Filled 2017-08-03: qty 2

## 2017-08-03 MED ORDER — MORPHINE SULFATE (PF) 2 MG/ML IV SOLN: 2.0000 mg | INTRAVENOUS | Status: DC | PRN

## 2017-08-03 MED ORDER — LEVOTHYROXINE SODIUM 100 MCG PO TABS
100.0000 ug | ORAL_TABLET | Freq: Every day | ORAL | Status: DC
  Administered 2017-08-04: 100 ug via ORAL
  Filled 2017-08-03: qty 1

## 2017-08-03 MED ORDER — FENTANYL CITRATE (PF) 250 MCG/5ML IJ SOLN
INTRAMUSCULAR | Status: AC
  Filled 2017-08-03: qty 5

## 2017-08-03 MED ORDER — HYDROCODONE-ACETAMINOPHEN 7.5-325 MG/15ML PO SOLN
10.0000 mL | ORAL | Status: DC | PRN
  Administered 2017-08-03 (×2): 10 mL via ORAL
  Administered 2017-08-04: 15 mL via ORAL
  Administered 2017-08-04: 10 mL via ORAL
  Filled 2017-08-03 (×4): qty 15

## 2017-08-03 MED ORDER — ACETAMINOPHEN 160 MG/5ML PO SOLN
650.0000 mg | ORAL | Status: DC | PRN
  Filled 2017-08-03: qty 20.3

## 2017-08-03 MED ORDER — SENNOSIDES-DOCUSATE SODIUM 8.6-50 MG PO TABS: 1.0000 | ORAL_TABLET | Freq: Every evening | ORAL | Status: DC | PRN

## 2017-08-03 SURGICAL SUPPLY — 37 items
BLADE SURG 15 STRL LF DISP TIS (BLADE) ×1 IMPLANT
BLADE SURG 15 STRL SS (BLADE) ×1
CANISTER SUCT 1200ML W/VALVE (MISCELLANEOUS) ×2 IMPLANT
CORD BIP STRL DISP 12FT (MISCELLANEOUS) ×2 IMPLANT
DERMABOND ADVANCED (GAUZE/BANDAGES/DRESSINGS)
DERMABOND ADVANCED .7 DNX12 (GAUZE/BANDAGES/DRESSINGS) IMPLANT
DRAIN TLS ROUND 10FR (DRAIN) IMPLANT
DRAPE MAG INST 16X20 L/F (DRAPES) ×2 IMPLANT
DRSG TEGADERM 2-3/8X2-3/4 SM (GAUZE/BANDAGES/DRESSINGS) IMPLANT
ELECT LARYNGEAL 6/7 (MISCELLANEOUS) ×2
ELECT LARYNGEAL 8/9 (MISCELLANEOUS) ×2
ELECT REM PT RETURN 9FT ADLT (ELECTROSURGICAL) ×2
ELECTRODE LARYNGEAL 6/7 (MISCELLANEOUS) ×1 IMPLANT
ELECTRODE LARYNGEAL 8/9 (MISCELLANEOUS) ×1 IMPLANT
ELECTRODE REM PT RTRN 9FT ADLT (ELECTROSURGICAL) ×1 IMPLANT
FORCEPS JEWEL BIP 4-3/4 STR (INSTRUMENTS) ×2 IMPLANT
GLOVE BIO SURGEON STRL SZ7.5 (GLOVE) ×4 IMPLANT
GOWN STRL REUS W/ TWL LRG LVL3 (GOWN DISPOSABLE) ×3 IMPLANT
GOWN STRL REUS W/TWL LRG LVL3 (GOWN DISPOSABLE) ×3
HEMOSTAT SURGICEL 2X3 (HEMOSTASIS) ×2 IMPLANT
HOOK STAY 5M SHARP BLUNT 3316- (MISCELLANEOUS) ×2 IMPLANT
KIT RM TURNOVER STRD PROC AR (KITS) ×2 IMPLANT
LABEL OR SOLS (LABEL) ×2 IMPLANT
NS IRRIG 500ML POUR BTL (IV SOLUTION) ×2 IMPLANT
PACK HEAD/NECK (MISCELLANEOUS) ×2 IMPLANT
PROBE NEUROSIGN BIPOL (MISCELLANEOUS) ×1 IMPLANT
PROBE NEUROSIGN BIPOLAR (MISCELLANEOUS) ×1
SHEARS HARMONIC 9CM CVD (BLADE) ×2 IMPLANT
SPONGE KITTNER 5P (MISCELLANEOUS) ×6 IMPLANT
SPONGE XRAY 4X4 16PLY STRL (MISCELLANEOUS) ×2 IMPLANT
STRIP CLOSURE SKIN 1/4X4 (GAUZE/BANDAGES/DRESSINGS) IMPLANT
SUT PROLENE 6 0 P 1 18 (SUTURE) ×2 IMPLANT
SUT SILK 2 0 (SUTURE) ×1
SUT SILK 2 0 SH (SUTURE) ×2 IMPLANT
SUT SILK 2-0 18XBRD TIE 12 (SUTURE) ×1 IMPLANT
SUT VIC AB 4-0 RB1 18 (SUTURE) ×2 IMPLANT
SYSTEM CHEST DRAIN TLS 7FR (DRAIN) IMPLANT

## 2017-08-03 NOTE — Anesthesia Post-op Follow-up Note (Signed)
Anesthesia QCDR form completed.        

## 2017-08-03 NOTE — Anesthesia Preprocedure Evaluation (Signed)
Anesthesia Evaluation  Patient identified by MRN, date of birth, ID band Patient awake    Reviewed: Allergy & Precautions, H&P , NPO status , Patient's Chart, lab work & pertinent test results, reviewed documented beta blocker date and time   Airway Mallampati: III  TM Distance: >3 FB Neck ROM: full    Dental  (+) Teeth Intact, Poor Dentition   Pulmonary neg pulmonary ROS, shortness of breath and with exertion, sleep apnea and Continuous Positive Airway Pressure Ventilation , former smoker,    Pulmonary exam normal        Cardiovascular Exercise Tolerance: Poor + CAD and + Peripheral Vascular Disease  (-) Orthopnea and (-) PND negative cardio ROS Normal cardiovascular exam+ dysrhythmias  Rhythm:regular Rate:Normal     Neuro/Psych PSYCHIATRIC DISORDERS Bilateral carotid artery stenosis negative neurological ROS  negative psych ROS   GI/Hepatic negative GI ROS, Neg liver ROS, GERD  Medicated,(+) Hepatitis -, B  Endo/Other  negative endocrine ROS  Renal/GU negative Renal ROS  negative genitourinary   Musculoskeletal   Abdominal   Peds  Hematology negative hematology ROS (+)   Anesthesia Other Findings Past Medical History: No date: Arthritis No date: Depression No date: Dyspnea No date: Dysrhythmia No date: GERD (gastroesophageal reflux disease) No date: Hepatitis     Comment:  Hepatitis B 1985 No date: Mitral valve prolapse No date: Sleep apnea     Comment:  CPAP No date: Thyroid dysfunction Past Surgical History: No date: CHOLECYSTECTOMY No date: EYE SURGERY; Bilateral 2000: FOOT SURGERY; Left No date: JOINT REPLACEMENT     Comment:  right knee No date: SHOULDER ARTHROSCOPY; Bilateral No date: SHOULDER ARTHROSCOPY; Left No date: TONSILLECTOMY No date: TUBAL LIGATION BMI    Body Mass Index:  40.97 kg/m     Reproductive/Obstetrics negative OB ROS                              Anesthesia Physical Anesthesia Plan  ASA: III  Anesthesia Plan: General ETT   Post-op Pain Management:    Induction:   PONV Risk Score and Plan: 4 or greater  Airway Management Planned: Video Laryngoscope Planned  Additional Equipment:   Intra-op Plan:   Post-operative Plan:   Informed Consent: I have reviewed the patients History and Physical, chart, labs and discussed the procedure including the risks, benefits and alternatives for the proposed anesthesia with the patient or authorized representative who has indicated his/her understanding and acceptance.   Dental Advisory Given  Plan Discussed with: CRNA  Anesthesia Plan Comments:         Anesthesia Quick Evaluation

## 2017-08-03 NOTE — H&P (Signed)
..  History and Physical paper copy reviewed and updated date of procedure and will be scanned into system.  Patient seen and examined and marked.  

## 2017-08-03 NOTE — Transfer of Care (Signed)
Immediate Anesthesia Transfer of Care Note  Patient: Mandy Jones  Procedure(s) Performed: THYROIDECTOMY (N/A ) LARYNGEAL NERVE MONITORING (N/A )  Patient Location: PACU  Anesthesia Type:General  Level of Consciousness: awake and responds to stimulation  Airway & Oxygen Therapy: Patient Spontanous Breathing and Patient connected to face mask oxygen  Post-op Assessment: Report given to RN and Post -op Vital signs reviewed and stable  Post vital signs: Reviewed and stable  Last Vitals:  Vitals:   08/03/17 0615 08/03/17 0939  BP: (!) 150/87 135/79  Pulse: 92 83  Resp: 18 15  Temp: 36.6 C   SpO2: 96% 95%    Last Pain:  Vitals:   08/03/17 0615  TempSrc: Tympanic  PainSc: 7          Complications: No apparent anesthesia complications

## 2017-08-03 NOTE — Anesthesia Procedure Notes (Signed)
Procedure Name: Intubation Performed by: Lance Muss, CRNA Pre-anesthesia Checklist: Patient identified, Patient being monitored, Timeout performed, Emergency Drugs available and Suction available Patient Re-evaluated:Patient Re-evaluated prior to induction Oxygen Delivery Method: Circle system utilized Preoxygenation: Pre-oxygenation with 100% oxygen Induction Type: IV induction Ventilation: Mask ventilation without difficulty Laryngoscope Size: McGraph and 4 Grade View: Grade I Tube type: Oral Tube size: 7.0 mm Number of attempts: 1 Airway Equipment and Method: Stylet Placement Confirmation: ETT inserted through vocal cords under direct vision,  positive ETCO2 and breath sounds checked- equal and bilateral Secured at: 21 cm Tube secured with: Tape Dental Injury: Teeth and Oropharynx as per pre-operative assessment

## 2017-08-03 NOTE — Op Note (Signed)
Marland Kitchen...08/03/2017  9:27 AM    Willner, Hinton Dyer  [id number]   Pre-Op Dx: THYROID NODULES  Post-op Dx: SAME  Proc: Total Thyroidectomy with Laryngeal Nerve Monitoring  Surg: Carloyn Manner  Assistant: Beverly Gust  Anes: GOT  EBL: <10ccs  Comp: None   Indications:Multinodular goiter with Left sided dominant thyroid nodule with compressive symptoms  Findings:Bilateral recurrent laryngeal nerves identified and preserved, left superior parathyroid gland identified.  Right superior and inferior parathyroid glands identified and preserved.  Significant amount of capsular inflammation and thickened noted bilaterally left greater than right.  Nodularity exophytic in nature on left sent for frozen section and noted to be significantly inflammed thyroid tissue.  Description of Procedure: After the patient was identified in hold and the history and physical and consent was reviewed and updated. The patient was marked in an upright position on the anterior neck along a natural occuring skin crease. The patient was next taken to the operating room and placed in a supine position. General endotracheal anesthesia was induced with laryngeal monitor endotracheal tube.  Direct visualization by the surgeon of the tube electrodes in contact with the vocal cords was made.. The patient's anterior marked neck crease was neck injected with 7cc's of 0.25% marcaine with 1:200,000 Epinephrine. The patient was next prepped and draped in a sterile normal fashion.  At this time, a 15 blade scalpel was used to make a skin incision along a previously marked anterior neck crease. Dissection was carefully performed through the subcutaneous tissues with combination of Bovie electrocautery and blunt dissection.  The platysma was incised and anterior neck veins ligated with harmonic scalpel.  The median raphe of the strap muscles was divided in a linear fashion with Bovie electrocautery until the anterior  border of the thyroid gland was identified.     Attention at this time was directed to the patient's left side. The sternohyoid muscle was bluntly dissected away from the large left thyroid gland.  The lateral border of the thyroid and the carotid artery was identified.  Dissection bluntly and with Bovie electrocautery was continued superiorly and inferiorly along the lateral edge of the thyroid.  The superior vessels were identified laterally and then medially with blunt dissection in Joel's space between the larynx and the superior thyroid pole.  Further dissection was continued inferiorly as well.  Once the superior pole was pedicled, the superior thyroid vessels were ligated with Harmonic Scalpel.  The large left thyroid was delivered from the wound.  The large left thyroid nodule once delivered from the wound revealed Berry's ligament and the nodule of Zuckerkandl.  Just beneath this nodule, the recurrent laryngeal nerve was identified and stimulated robustly with movement of the arytenoid joint.  An inferior mass exophytic from the thyroid was noted.  This was either exophytic thyroid tissue, lymph nodes, or a parathyroid adenoma.  This was sent for frozen section and noted to be inflammed thyroid tissue.  Next, the remaining attachments of Berry's ligament was divided and the left thyroid gland was completed  Attention at this time was directed to the patient's right side. The sternohyoid muscle was bluntly dissected away from the right hemithyroid.  The lateral border of the thyroid and the carotid artery was identified.  Dissection bluntly and with Bovie electrocautery was continued superiorly and inferiorly along the lateral edge of the thyroid.  The superior vessels were identified laterally and then medially with blunt dissection in Joel's space between the larynx and the superior thyroid pole.  Further dissection was continued  inferiorly as well.  Once the superior pole was pedicled, the superior  thyroid vessels were ligated with Harmonic Scalpel.  The right hemithyroid was delivered from the wound after further dissection inferior and superiorly.   Once delivered from the wound revealed Berry's ligament and the nodule of Zuckerkandl.  Just beneath this nodule, the recurrent laryngeal nerve was identified and stimulated robustly with movement of the arytenoid joint.  The inferior parathyroid was identified adjacent to the nerve along with the superior parathyroid as well.  These were identified with good vascularization.  The nerve was tracked until its insertion into the larynx.  Next, the remaining attachments of Berry's ligament was divided and the right hemithyroid gland was completed.  There remaining attachments of the total thyroid were separated from the larynx using bipolar and harmonic scalpel.  The gland was marked on the superior pole of the patient's right thyroid gland with a marking stitch and passed off the table for permanent evaluation.  The wound was copiously irrigated with sterile saline. Meticulous hemostasis with bipolar was obtained.  Visualization of an intact left and right recurrent laryngeal nerves was made and these stimulated robustly.  The parathyroid glands were intact and well perfused. Surgicel was placed in the wound bed bilaterally. The wound was then closed in a multilayered fashion with vicryl for subcutaneous tissues and dermabond for skin closure and topped with a steri-strip.  At this time the patient was extubated and taken to PACU in good condition.  Plan: Admit for observation.  Follow pathology.  Limit activity for 2 weeks. Follow up next week for post-operative evaluation.  Follow calcium levels.  Begin increased synthroid.  Calcium taper.  Bonnye Halle  08/03/2017 9:27 AM

## 2017-08-04 DIAGNOSIS — E042 Nontoxic multinodular goiter: Secondary | ICD-10-CM | POA: Diagnosis not present

## 2017-08-04 LAB — CALCIUM
CALCIUM: 8.2 mg/dL — AB (ref 8.9–10.3)
CALCIUM: 8.4 mg/dL — AB (ref 8.9–10.3)

## 2017-08-04 MED ORDER — HYDROCODONE-ACETAMINOPHEN 7.5-325 MG/15ML PO SOLN: 10.0000 mL | ORAL | 0 refills | Status: DC | PRN

## 2017-08-04 MED ORDER — CALCIUM CARBONATE ANTACID 500 MG PO CHEW
1000.0000 mg | CHEWABLE_TABLET | Freq: Three times a day (TID) | ORAL | Status: DC
  Administered 2017-08-04 (×2): 1000 mg via ORAL
  Filled 2017-08-04 (×4): qty 5

## 2017-08-04 MED ORDER — CALCIUM CARBONATE ANTACID 500 MG PO CHEW: 1000.0000 mg | CHEWABLE_TABLET | Freq: Three times a day (TID) | ORAL | 0 refills | Status: DC

## 2017-08-04 MED ORDER — ONDANSETRON HCL 4 MG PO TABS: 4.0000 mg | ORAL_TABLET | ORAL | 0 refills | Status: DC | PRN

## 2017-08-04 MED ORDER — MAGNESIUM OXIDE 400 (241.3 MG) MG PO TABS: 400.0000 mg | ORAL_TABLET | Freq: Two times a day (BID) | ORAL | 0 refills | Status: AC

## 2017-08-04 MED ORDER — LEVOTHYROXINE SODIUM 100 MCG PO TABS: 100.0000 ug | ORAL_TABLET | Freq: Every day | ORAL | 11 refills | Status: DC

## 2017-08-04 NOTE — Progress Notes (Signed)
Discharge instruction given to patient, patient verbalized understanding and did not have any questions regarding discharge care. All patient belongings gathered from room and she was transported home by family.

## 2017-08-04 NOTE — Progress Notes (Signed)
..   08/04/2017 7:45 AM  Mcclaren, Hinton Dyer [id number]  Post-Op Day 0    Temp:  [97.5 F (36.4 C)-98.8 F (37.1 C)] 97.5 F (36.4 C) (12/07 0450) Pulse Rate:  [82-93] 82 (12/07 0450) Resp:  [15-21] 18 (12/07 0450) BP: (117-149)/(52-79) 149/72 (12/07 0450) SpO2:  [90 %-99 %] 99 % (12/07 0450),     Intake/Output Summary (Last 24 hours) at 08/04/2017 0745 Last data filed at 08/04/2017 0500 Gross per 24 hour  Intake 1914 ml  Output 1420 ml  Net 494 ml    Results for orders placed or performed during the hospital encounter of 08/03/17 (from the past 24 hour(s))  Magnesium     Status: None   Collection Time: 08/03/17  9:53 AM  Result Value Ref Range   Magnesium 1.8 1.7 - 2.4 mg/dL  Albumin     Status: None   Collection Time: 08/03/17  9:53 AM  Result Value Ref Range   Albumin 3.9 3.5 - 5.0 g/dL  Calcium     Status: None   Collection Time: 08/03/17  9:53 AM  Result Value Ref Range   Calcium 9.5 8.9 - 10.3 mg/dL  Calcium     Status: Abnormal   Collection Time: 08/03/17  6:41 PM  Result Value Ref Range   Calcium 8.5 (L) 8.9 - 10.3 mg/dL  Calcium     Status: Abnormal   Collection Time: 08/04/17  4:49 AM  Result Value Ref Range   Calcium 8.2 (L) 8.9 - 10.3 mg/dL    SUBJECTIVE:  No acute events.  Feels better this a.m.  Tolerating diet.  Pain controlled with medications.  OBJECTIVE:  GEN-  NAD NECK-  Incision c/d/i  IMPRESSION:  S/p total thyroidectomy POD#1  PLAN:  1)  Wean Oxygen  2)  Incentive spirometry  3)  Recheck calcium around noon  4)  If above 8 ok to discharge home  Ann Groeneveld 08/04/2017, 7:45 AM

## 2017-08-04 NOTE — Anesthesia Postprocedure Evaluation (Signed)
Anesthesia Post Note  Patient: Mandy Jones  Procedure(s) Performed: THYROIDECTOMY (N/A ) LARYNGEAL NERVE MONITORING (N/A )  Patient location during evaluation: PACU Anesthesia Type: General Level of consciousness: awake and alert Pain management: pain level controlled Vital Signs Assessment: post-procedure vital signs reviewed and stable Respiratory status: spontaneous breathing, nonlabored ventilation, respiratory function stable and patient connected to nasal cannula oxygen Cardiovascular status: blood pressure returned to baseline and stable Postop Assessment: no apparent nausea or vomiting Anesthetic complications: no     Last Vitals:  Vitals:   08/03/17 2001 08/04/17 0450  BP: 133/66 (!) 149/72  Pulse: 89 82  Resp: 20 18  Temp: 36.8 C (!) 36.4 C  SpO2: 96% 99%    Last Pain:  Vitals:   08/04/17 1211  TempSrc:   PainSc: [place] Emon Miggins

## 2017-08-07 LAB — SURGICAL PATHOLOGY

## 2017-09-29 ENCOUNTER — Other Ambulatory Visit
Admission: RE | Admit: 2017-09-29 | Discharge: 2017-09-29 | Disposition: A | Discharge status: Home / Self Care | Payer: Medicare HMO | Source: Ambulatory Visit | Attending: Otolaryngology | Admitting: Otolaryngology

## 2017-09-29 DIAGNOSIS — E890 Postprocedural hypothyroidism: Secondary | ICD-10-CM | POA: Diagnosis present

## 2017-09-29 LAB — CALCIUM: Calcium: 9.8 mg/dL (ref 8.9–10.3)

## 2017-09-29 LAB — TSH: TSH: 50 u[IU]/mL — ABNORMAL HIGH (ref 0.350–4.500)

## 2017-09-30 LAB — VITAMIN D 25 HYDROXY (VIT D DEFICIENCY, FRACTURES): Vit D, 25-Hydroxy: 41.8 ng/mL (ref 30.0–100.0)

## 2017-12-14 ENCOUNTER — Encounter: Payer: Self-pay | Admitting: Emergency Medicine

## 2017-12-14 ENCOUNTER — Observation Stay
Admission: EM | Admit: 2017-12-14 | Discharge: 2017-12-15 | Disposition: A | Discharge status: Home / Self Care | Payer: Medicare HMO | Attending: Internal Medicine | Admitting: Internal Medicine

## 2017-12-14 ENCOUNTER — Emergency Department: Payer: Medicare HMO

## 2017-12-14 ENCOUNTER — Other Ambulatory Visit: Payer: Self-pay

## 2017-12-14 DIAGNOSIS — Z79899 Other long term (current) drug therapy: Secondary | ICD-10-CM | POA: Diagnosis not present

## 2017-12-14 DIAGNOSIS — R0600 Dyspnea, unspecified: Secondary | ICD-10-CM | POA: Insufficient documentation

## 2017-12-14 DIAGNOSIS — E669 Obesity, unspecified: Secondary | ICD-10-CM | POA: Diagnosis not present

## 2017-12-14 DIAGNOSIS — I10 Essential (primary) hypertension: Secondary | ICD-10-CM | POA: Insufficient documentation

## 2017-12-14 DIAGNOSIS — I341 Nonrheumatic mitral (valve) prolapse: Secondary | ICD-10-CM | POA: Diagnosis not present

## 2017-12-14 DIAGNOSIS — G4730 Sleep apnea, unspecified: Secondary | ICD-10-CM | POA: Insufficient documentation

## 2017-12-14 DIAGNOSIS — I4891 Unspecified atrial fibrillation: Secondary | ICD-10-CM | POA: Diagnosis present

## 2017-12-14 DIAGNOSIS — F329 Major depressive disorder, single episode, unspecified: Secondary | ICD-10-CM | POA: Diagnosis not present

## 2017-12-14 DIAGNOSIS — Z7982 Long term (current) use of aspirin: Secondary | ICD-10-CM | POA: Insufficient documentation

## 2017-12-14 DIAGNOSIS — E782 Mixed hyperlipidemia: Secondary | ICD-10-CM | POA: Diagnosis not present

## 2017-12-14 DIAGNOSIS — Z6839 Body mass index (BMI) 39.0-39.9, adult: Secondary | ICD-10-CM | POA: Diagnosis not present

## 2017-12-14 DIAGNOSIS — E039 Hypothyroidism, unspecified: Secondary | ICD-10-CM

## 2017-12-14 DIAGNOSIS — I38 Endocarditis, valve unspecified: Secondary | ICD-10-CM

## 2017-12-14 DIAGNOSIS — E890 Postprocedural hypothyroidism: Secondary | ICD-10-CM | POA: Diagnosis not present

## 2017-12-14 DIAGNOSIS — R252 Cramp and spasm: Secondary | ICD-10-CM

## 2017-12-14 DIAGNOSIS — Z7901 Long term (current) use of anticoagulants: Secondary | ICD-10-CM | POA: Insufficient documentation

## 2017-12-14 DIAGNOSIS — Z87891 Personal history of nicotine dependence: Secondary | ICD-10-CM | POA: Diagnosis not present

## 2017-12-14 DIAGNOSIS — K219 Gastro-esophageal reflux disease without esophagitis: Secondary | ICD-10-CM | POA: Insufficient documentation

## 2017-12-14 LAB — CBC WITH DIFFERENTIAL/PLATELET
BASOS ABS: 0 10*3/uL (ref 0–0.1)
Basophils Relative: 0 %
Eosinophils Absolute: 0.1 10*3/uL (ref 0–0.7)
Eosinophils Relative: 1 %
HEMATOCRIT: 40.2 % (ref 35.0–47.0)
Hemoglobin: 13.3 g/dL (ref 12.0–16.0)
LYMPHS PCT: 19 %
Lymphs Abs: 1.5 10*3/uL (ref 1.0–3.6)
MCH: 28.8 pg (ref 26.0–34.0)
MCHC: 33.2 g/dL (ref 32.0–36.0)
MCV: 87 fL (ref 80.0–100.0)
Monocytes Absolute: 0.6 10*3/uL (ref 0.2–0.9)
Monocytes Relative: 8 %
NEUTROS ABS: 5.6 10*3/uL (ref 1.4–6.5)
Neutrophils Relative %: 72 %
Platelets: 114 10*3/uL — ABNORMAL LOW (ref 150–440)
RBC: 4.62 MIL/uL (ref 3.80–5.20)
RDW: 15.2 % — ABNORMAL HIGH (ref 11.5–14.5)
WBC: 7.9 10*3/uL (ref 3.6–11.0)

## 2017-12-14 LAB — COMPREHENSIVE METABOLIC PANEL
ALT: 13 U/L — ABNORMAL LOW (ref 14–54)
AST: 23 U/L (ref 15–41)
Albumin: 4.3 g/dL (ref 3.5–5.0)
Alkaline Phosphatase: 91 U/L (ref 38–126)
Anion gap: 10 (ref 5–15)
BUN: 14 mg/dL (ref 6–20)
CHLORIDE: 103 mmol/L (ref 101–111)
CO2: 26 mmol/L (ref 22–32)
Calcium: 9.6 mg/dL (ref 8.9–10.3)
Creatinine, Ser: 0.52 mg/dL (ref 0.44–1.00)
Glucose, Bld: 111 mg/dL — ABNORMAL HIGH (ref 65–99)
POTASSIUM: 4 mmol/L (ref 3.5–5.1)
Sodium: 139 mmol/L (ref 135–145)
Total Bilirubin: 0.9 mg/dL (ref 0.3–1.2)
Total Protein: 7.2 g/dL (ref 6.5–8.1)

## 2017-12-14 LAB — CBC
HCT: 38.5 % (ref 35.0–47.0)
HEMOGLOBIN: 12.7 g/dL (ref 12.0–16.0)
MCH: 28.9 pg (ref 26.0–34.0)
MCHC: 33 g/dL (ref 32.0–36.0)
MCV: 87.6 fL (ref 80.0–100.0)
PLATELETS: 166 10*3/uL (ref 150–440)
RBC: 4.39 MIL/uL (ref 3.80–5.20)
RDW: 14.9 % — ABNORMAL HIGH (ref 11.5–14.5)
WBC: 7.2 10*3/uL (ref 3.6–11.0)

## 2017-12-14 LAB — TSH: TSH: 26.222 u[IU]/mL — ABNORMAL HIGH (ref 0.350–4.500)

## 2017-12-14 LAB — BRAIN NATRIURETIC PEPTIDE: B Natriuretic Peptide: 196 pg/mL — ABNORMAL HIGH (ref 0.0–100.0)

## 2017-12-14 LAB — TROPONIN I

## 2017-12-14 LAB — FIBRIN DERIVATIVES D-DIMER (ARMC ONLY): FIBRIN DERIVATIVES D-DIMER (ARMC): 836.18 ng{FEU}/mL — AB (ref 0.00–499.00)

## 2017-12-14 MED ORDER — SODIUM CHLORIDE 0.9 % IV SOLN
INTRAVENOUS | Status: DC
  Administered 2017-12-15: via INTRAVENOUS

## 2017-12-14 MED ORDER — VITAMIN D 1000 UNITS PO TABS
1000.0000 [IU] | ORAL_TABLET | Freq: Every day | ORAL | Status: DC
  Administered 2017-12-15: 1000 [IU] via ORAL
  Filled 2017-12-14: qty 1

## 2017-12-14 MED ORDER — CYCLOSPORINE 0.05 % OP EMUL: 2.0000 [drp] | Freq: Every day | OPHTHALMIC | Status: DC | PRN

## 2017-12-14 MED ORDER — DOCUSATE SODIUM 100 MG PO CAPS
100.0000 mg | ORAL_CAPSULE | Freq: Two times a day (BID) | ORAL | Status: DC
  Administered 2017-12-14 – 2017-12-15 (×2): 100 mg via ORAL
  Filled 2017-12-14 (×2): qty 1

## 2017-12-14 MED ORDER — FAMOTIDINE IN NACL 20-0.9 MG/50ML-% IV SOLN
20.0000 mg | Freq: Two times a day (BID) | INTRAVENOUS | Status: DC
  Administered 2017-12-14 – 2017-12-15 (×2): 20 mg via INTRAVENOUS
  Filled 2017-12-14 (×2): qty 50

## 2017-12-14 MED ORDER — ONDANSETRON HCL 4 MG/2ML IJ SOLN: 4.0000 mg | Freq: Four times a day (QID) | INTRAMUSCULAR | Status: DC | PRN

## 2017-12-14 MED ORDER — MELOXICAM 7.5 MG PO TABS
15.0000 mg | ORAL_TABLET | Freq: Every day | ORAL | Status: DC
  Administered 2017-12-15: 15 mg via ORAL
  Filled 2017-12-14: qty 2

## 2017-12-14 MED ORDER — DILTIAZEM LOAD VIA INFUSION
10.0000 mg | Freq: Once | INTRAVENOUS | Status: AC
Start: 2017-12-14 — End: 2017-12-14
  Administered 2017-12-14: 10 mg via INTRAVENOUS
  Filled 2017-12-14: qty 10

## 2017-12-14 MED ORDER — ADENOSINE 12 MG/4ML IV SOLN
INTRAVENOUS | Status: AC
  Administered 2017-12-14: 12 mg
  Filled 2017-12-14: qty 4

## 2017-12-14 MED ORDER — BUPROPION HCL ER (XL) 150 MG PO TB24
300.0000 mg | ORAL_TABLET | Freq: Every day | ORAL | Status: DC
  Administered 2017-12-15: 300 mg via ORAL
  Filled 2017-12-14: qty 2

## 2017-12-14 MED ORDER — ONDANSETRON HCL 4 MG PO TABS: 4.0000 mg | ORAL_TABLET | Freq: Four times a day (QID) | ORAL | Status: DC | PRN

## 2017-12-14 MED ORDER — ASPIRIN EC 81 MG PO TBEC
81.0000 mg | DELAYED_RELEASE_TABLET | Freq: Every day | ORAL | Status: DC
  Administered 2017-12-15: 81 mg via ORAL
  Filled 2017-12-14: qty 1

## 2017-12-14 MED ORDER — BISACODYL 10 MG RE SUPP: 10.0000 mg | Freq: Every day | RECTAL | Status: DC | PRN

## 2017-12-14 MED ORDER — LEVOTHYROXINE SODIUM 50 MCG PO TABS
200.0000 ug | ORAL_TABLET | Freq: Every day | ORAL | Status: DC
  Administered 2017-12-15: 200 ug via ORAL
  Filled 2017-12-14: qty 4

## 2017-12-14 MED ORDER — FUROSEMIDE 20 MG PO TABS
20.0000 mg | ORAL_TABLET | Freq: Every day | ORAL | Status: DC
  Administered 2017-12-15: 20 mg via ORAL
  Filled 2017-12-14: qty 1

## 2017-12-14 MED ORDER — GUAIFENESIN-DM 100-10 MG/5ML PO SYRP
5.0000 mL | ORAL_SOLUTION | ORAL | Status: DC | PRN
  Administered 2017-12-14: 5 mL via ORAL
  Filled 2017-12-14: qty 5

## 2017-12-14 MED ORDER — TRAZODONE HCL 50 MG PO TABS: 25.0000 mg | ORAL_TABLET | Freq: Every evening | ORAL | Status: DC | PRN

## 2017-12-14 MED ORDER — ACETAMINOPHEN 650 MG RE SUPP: 650.0000 mg | Freq: Four times a day (QID) | RECTAL | Status: DC | PRN

## 2017-12-14 MED ORDER — LORAZEPAM 2 MG/ML IJ SOLN: 0.5000 mg | INTRAMUSCULAR | Status: DC | PRN

## 2017-12-14 MED ORDER — MELATONIN 5 MG PO TABS
1.0000 | ORAL_TABLET | Freq: Every day | ORAL | Status: DC
  Administered 2017-12-14: 5 mg via ORAL
  Filled 2017-12-14 (×2): qty 1

## 2017-12-14 MED ORDER — DILTIAZEM HCL 100 MG IV SOLR
5.0000 mg/h | INTRAVENOUS | Status: DC
  Administered 2017-12-14 – 2017-12-15 (×2): 5 mg/h via INTRAVENOUS
  Filled 2017-12-14 (×2): qty 100

## 2017-12-14 MED ORDER — ADENOSINE 12 MG/4ML IV SOLN: 12.0000 mg | Freq: Once | INTRAVENOUS | Status: DC

## 2017-12-14 MED ORDER — ATORVASTATIN CALCIUM 10 MG PO TABS
10.0000 mg | ORAL_TABLET | Freq: Every day | ORAL | Status: DC
  Administered 2017-12-15: 10 mg via ORAL
  Filled 2017-12-14: qty 1

## 2017-12-14 MED ORDER — METOPROLOL TARTRATE 25 MG PO TABS
25.0000 mg | ORAL_TABLET | Freq: Two times a day (BID) | ORAL | Status: DC
  Administered 2017-12-14 – 2017-12-15 (×2): 25 mg via ORAL
  Filled 2017-12-14 (×2): qty 1

## 2017-12-14 MED ORDER — DILTIAZEM HCL 25 MG/5ML IV SOLN
INTRAVENOUS | Status: AC
  Filled 2017-12-14: qty 5

## 2017-12-14 MED ORDER — ACETAMINOPHEN 325 MG PO TABS: 650.0000 mg | ORAL_TABLET | Freq: Four times a day (QID) | ORAL | Status: DC | PRN

## 2017-12-14 MED ORDER — OCUVITE-LUTEIN PO CAPS
1.0000 | ORAL_CAPSULE | Freq: Every day | ORAL | Status: DC
  Administered 2017-12-15: 1 via ORAL
  Filled 2017-12-14: qty 1

## 2017-12-14 MED ORDER — ENOXAPARIN SODIUM 40 MG/0.4ML ~~LOC~~ SOLN: 40.0000 mg | SUBCUTANEOUS | Status: DC

## 2017-12-14 MED ORDER — ADENOSINE 6 MG/2ML IV SOLN
6.0000 mg | Freq: Once | INTRAVENOUS | Status: AC
  Administered 2017-12-14: 6 mg via INTRAVENOUS
  Filled 2017-12-14: qty 2

## 2017-12-14 NOTE — H&P (Signed)
History and Physical    Mandy Jones LFY:[id number] DOB: 1950-10-29 DOA: 12/14/2017  Referring physician: Dr. Cinda Quest PCP: Elaina Pattee, MD  Specialists: Dr. Nehemiah Massed  Chief Complaint: cramping  HPI: Mandy Jones is a 67 y.o. female has a past medical history significant for valvular heart disease and s/p thyroidectomy on Synthroid who presents to ER with diffuse cramping. In ER, pt noted to be in A-fib with RVR. Also noted to have TSH=26. In ER, pt started on Diltiazem drip with little to no improvement of rate. She is now admitted. Denies CP or SOB. No palpitations. Afebrile. No N/V/D.   Review of Systems: The patient denies anorexia, fever, weight loss,, vision loss, decreased hearing, hoarseness, chest pain, syncope, dyspnea on exertion, peripheral edema, balance deficits, hemoptysis, abdominal pain, melena, hematochezia, severe indigestion/heartburn, hematuria, incontinence, genital sores, suspicious skin lesions, transient blindness, difficulty walking, depression, unusual weight change, abnormal bleeding, enlarged lymph nodes, angioedema, and breast masses.   Past Medical History:  Diagnosis Date  . Arthritis   . Depression   . Dyspnea   . Dysrhythmia   . GERD (gastroesophageal reflux disease)   . Hepatitis    Hepatitis B 1985  . Mitral valve prolapse   . Sleep apnea    CPAP  . Thyroid dysfunction    Past Surgical History:  Procedure Laterality Date  . CHOLECYSTECTOMY    . CONTINUOUS NERVE MONITORING N/A 08/03/2017   Procedure: LARYNGEAL NERVE MONITORING;  Surgeon: Carloyn Manner, MD;  Location: [facility] ORS;  Service: ENT;  Laterality: N/A;  . EYE SURGERY Bilateral   . FOOT SURGERY Left 2000  . JOINT REPLACEMENT     right knee  . SHOULDER ARTHROSCOPY Bilateral   . SHOULDER ARTHROSCOPY Left   . THYROIDECTOMY N/A 08/03/2017   Procedure: THYROIDECTOMY;  Surgeon: Carloyn Manner, MD;  Location: [facility] ORS;  Service: ENT;  Laterality: N/A;  .  TONSILLECTOMY    . TUBAL LIGATION     Social History:  reports that she has quit smoking. She has quit using smokeless tobacco. She reports that she drinks alcohol. She reports that she does not use drugs.  No Known Allergies  History reviewed. No pertinent family history.  Prior to Admission medications   Medication Sig Start Date End Date Taking? Authorizing Provider  aspirin EC 81 MG tablet Take 81 mg by mouth daily.   Yes [provider]  atorvastatin (LIPITOR) 10 MG tablet Take 10 mg by mouth daily. 05/05/17  Yes [provider]  b complex vitamins tablet Take 1 tablet by mouth daily.   Yes [provider]  buPROPion (WELLBUTRIN XL) 300 MG 24 hr tablet Take 300 mg by mouth daily.  05/10/17  Yes [provider]  calcium carbonate (TUMS - DOSED IN MG ELEMENTAL CALCIUM) 500 MG chewable tablet Chew 5 tablets (1,000 mg of elemental calcium total) by mouth 3 (three) times daily with meals. 08/04/17  Yes Vaught, Jeannie Fend, MD  cholecalciferol (VITAMIN D) 1000 units tablet Take 1,000 Units by mouth daily.   Yes [provider]  cycloSPORINE (RESTASIS) 0.05 % ophthalmic emulsion Place 2 drops into both eyes daily as needed (for dry eyes).   Yes [provider]  furosemide (LASIX) 20 MG tablet Take 20 mg by mouth daily. 05/19/17  Yes [provider]  KRILL OIL PO Take 1 capsule by mouth 2 (two) times daily.   Yes [provider]  levothyroxine (SYNTHROID, LEVOTHROID) 100 MCG tablet Take 1 tablet (100 mcg total)  by mouth daily before breakfast. Patient taking differently: Take 150 mcg by mouth daily before breakfast.  08/05/17  Yes Vaught, Creighton, MD  Melatonin 5 MG TABS Take 1 tablet by mouth at bedtime.   Yes [provider]  meloxicam (MOBIC) 15 MG tablet Take 15 mg by mouth daily. 11/30/17  Yes [provider]  multivitamin-lutein (OCUVITE-LUTEIN) CAPS capsule Take 1 capsule by mouth daily.   Yes [provider]  ranitidine (ZANTAC) 150 MG tablet Take 300 mg by mouth every evening. 10/05/16  Yes [provider]  traZODone (DESYREL) 50 MG tablet Take 25 mg by mouth at bedtime as needed. 12/04/17  Yes [provider]  HYDROcodone-acetaminophen (HYCET) 7.5-325 mg/15 ml solution Take 10-15 mLs by mouth every 4 (four) hours as needed for moderate pain. Patient not taking: Reported on 12/14/2017 08/04/17   Carloyn Manner, MD  ondansetron (ZOFRAN) 4 MG tablet Take 1 tablet (4 mg total) by mouth every 4 (four) hours as needed for nausea. Patient not taking: Reported on 12/14/2017 08/04/17   Carloyn Manner, MD   Physical Exam: Vitals:   12/14/17 2015 12/14/17 2016 12/14/17 2017 12/14/17 2018  BP:      Pulse: 83 (!) 58 74 (!) 38  Resp: 15 13 15 17   Temp:      TempSrc:      SpO2: 94% 94% 94% 91%  Weight:      Height:         General:  No apparent distress, WDWN, [place]/AT  Eyes: PERRL, EOMI, no scleral icterus, conjunctiva clear  ENT: moist oropharynx without exudate, TM's benign, dentition good  Neck: supple, no lymphadenopathy. No bruits or thyromegaly  Cardiovascular: irregularly irregular with rapid rate without MRG; 2+ peripheral pulses, no JVD, trace peripheral edema  Respiratory: CTA biL, good air movement without wheezing, rhonchi or crackled. Respiratory effort normal  Abdomen: soft, non tender to palpation, positive bowel sounds, no guarding, no rebound  Skin: no rashes or lesions  Musculoskeletal: normal bulk and tone, no joint swelling  Psychiatric: normal mood and affect, A&OX3  Neurologic: CN 2-12 grossly intact, Motor strength 5/5 in all 4 groups with symmetric DTR's and non-focal sensory exam  Labs on Admission:  Basic Metabolic Panel: Recent Labs  Lab 12/14/17 1722  NA 139  K 4.0  CL 103  CO2 26  GLUCOSE 111*  BUN 14  CREATININE 0.52  CALCIUM 9.6   Liver Function Tests: Recent Labs  Lab 12/14/17 1722  AST 23  ALT 13*  ALKPHOS 91   BILITOT 0.9  PROT 7.2  ALBUMIN 4.3   No results for input(s): LIPASE, AMYLASE in the last 168 hours. No results for input(s): AMMONIA in the last 168 hours. CBC: Recent Labs  Lab 12/14/17 1722  WBC 7.9  NEUTROABS 5.6  HGB 13.3  HCT 40.2  MCV 87.0  PLT 114*   Cardiac Enzymes: Recent Labs  Lab 12/14/17 1722  TROPONINI <0.03    BNP (last 3 results) Recent Labs    12/14/17 1847  BNP 196.0*    ProBNP (last 3 results) No results for input(s): PROBNP in the last 8760 hours.  CBG: No results for input(s): GLUCAP in the last 168 hours.  Radiological Exams on Admission: Dg Chest Portable 1 View  Result Date: 12/14/2017 CLINICAL DATA:  67 y/o  F; new onset atrial fibrillation. EXAM: PORTABLE CHEST 1 VIEW COMPARISON:  None. FINDINGS: Cardiomegaly. No consolidation, pneumothorax, or effusion. Left shoulder rotator cuff repair postsurgical changes. Bones are  otherwise unremarkable. IMPRESSION: Cardiomegaly.  No acute pulmonary process identified. Electronically Signed   By: Kristine Garbe M.D.   On: 12/14/2017 17:51   Dg Foot Complete Left  Result Date: 12/14/2017 CLINICAL DATA:  LEFT foot pain and swelling.  Initial encounter. EXAM: LEFT FOOT - COMPLETE 3+ VIEW COMPARISON:  None. FINDINGS: There may be mild dorsal soft tissue swelling present. No acute fracture, subluxation or dislocation identified. No radiographic evidence of osteomyelitis noted. No radiopaque foreign bodies noted. IMPRESSION: Question dorsal soft tissue swelling.  No acute bony abnormality. Electronically Signed   By: Margarette Canada M.D.   On: 12/14/2017 18:10    EKG: Independently reviewed.  Assessment/Plan Principal Problem:   Atrial fibrillation with RVR (HCC) Active Problems:   Hypothyroidism   Valvular heart disease   Muscle cramps   Will admit to telemetry with IV Diltiazem and follow enzymes. Increase Synthroid to 279mcg. Echo ordered. Consult Cardiology. Repeat labs in AM  Diet: low  salt Fluids: NS@75  DVT Prophylaxis: Lovenox  Code Status: FULL  Family Communication: none  Disposition Plan: home  Time spent: 50 min

## 2017-12-14 NOTE — ED Provider Notes (Addendum)
[facility] Emergency Department Provider Note   ____________________________________________   First MD Initiated Contact with Patient 12/14/17 1720     (approximate)  I have reviewed the triage vital signs and the nursing notes.   HISTORY  Chief Complaint Palpitations    HPI Mandy Jones is a 67 y.o. female patient was seeing physical therapy for her right foot where she has a torn tendon and notes that her heart rate was high and sent her here to be evaluated.  She says she has not noticed any rapid heartbeat she is not short of breath has no chest pain or any other complaints.  She has not known that she has any heart rhythm irregularity ever.  Patient is not taking any extra thyroid replacement.  Patient does not drink more than 2 cups of coffee a day which she had today in many days previous to this.  Patient does not drink alcohol or use drugs.   Past Medical History:  Diagnosis Date  . Arthritis   . Depression   . Dyspnea   . Dysrhythmia   . GERD (gastroesophageal reflux disease)   . Hepatitis    Hepatitis B 1985  . Mitral valve prolapse   . Sleep apnea    CPAP  . Thyroid dysfunction     Patient Active Problem List   Diagnosis Date Noted  . Atrial fibrillation with RVR ([place]) 12/14/2017  . Hypothyroidism 12/14/2017  . Valvular heart disease 12/14/2017  . Muscle cramps 12/14/2017  . S/P complete thyroidectomy 08/03/2017    Past Surgical History:  Procedure Laterality Date  . CHOLECYSTECTOMY    . CONTINUOUS NERVE MONITORING N/A 08/03/2017   Procedure: LARYNGEAL NERVE MONITORING;  Surgeon: Carloyn Manner, MD;  Location: [facility] ORS;  Service: ENT;  Laterality: N/A;  . EYE SURGERY Bilateral   . FOOT SURGERY Left 2000  . JOINT REPLACEMENT     right knee  . SHOULDER ARTHROSCOPY Bilateral   . SHOULDER ARTHROSCOPY Left   . THYROIDECTOMY N/A 08/03/2017   Procedure: THYROIDECTOMY;  Surgeon: Carloyn Manner, MD;  Location: [facility]  ORS;  Service: ENT;  Laterality: N/A;  . TONSILLECTOMY    . TUBAL LIGATION      Prior to Admission medications   Medication Sig Start Date End Date Taking? Authorizing Provider  aspirin EC 81 MG tablet Take 81 mg by mouth daily.   Yes [provider]  atorvastatin (LIPITOR) 10 MG tablet Take 10 mg by mouth daily. 05/05/17  Yes [provider]  b complex vitamins tablet Take 1 tablet by mouth daily.   Yes [provider]  buPROPion (WELLBUTRIN XL) 300 MG 24 hr tablet Take 300 mg by mouth daily.  05/10/17  Yes [provider]  calcium carbonate (TUMS - DOSED IN MG ELEMENTAL CALCIUM) 500 MG chewable tablet Chew 5 tablets (1,000 mg of elemental calcium total) by mouth 3 (three) times daily with meals. 08/04/17  Yes Vaught, Jeannie Fend, MD  cholecalciferol (VITAMIN D) 1000 units tablet Take 1,000 Units by mouth daily.   Yes [provider]  cycloSPORINE (RESTASIS) 0.05 % ophthalmic emulsion Place 2 drops into both eyes daily as needed (for dry eyes).   Yes [provider]  furosemide (LASIX) 20 MG tablet Take 20 mg by mouth daily. 05/19/17  Yes [provider]  KRILL OIL PO Take 1 capsule by mouth 2 (two) times daily.   Yes [provider]  levothyroxine (SYNTHROID, LEVOTHROID) 100 MCG tablet Take 1 tablet (  100 mcg total) by mouth daily before breakfast. Patient taking differently: Take 150 mcg by mouth daily before breakfast.  08/05/17  Yes Vaught, Creighton, MD  Melatonin 5 MG TABS Take 1 tablet by mouth at bedtime.   Yes [provider]  meloxicam (MOBIC) 15 MG tablet Take 15 mg by mouth daily. 11/30/17  Yes [provider]  multivitamin-lutein (OCUVITE-LUTEIN) CAPS capsule Take 1 capsule by mouth daily.   Yes [provider]  ranitidine (ZANTAC) 150 MG tablet Take 300 mg by mouth every evening. 10/05/16  Yes [provider]  traZODone (DESYREL) 50 MG tablet Take 25 mg by mouth at bedtime as needed.  12/04/17  Yes [provider]  HYDROcodone-acetaminophen (HYCET) 7.5-325 mg/15 ml solution Take 10-15 mLs by mouth every 4 (four) hours as needed for moderate pain. Patient not taking: Reported on 12/14/2017 08/04/17   Carloyn Manner, MD  ondansetron (ZOFRAN) 4 MG tablet Take 1 tablet (4 mg total) by mouth every 4 (four) hours as needed for nausea. Patient not taking: Reported on 12/14/2017 08/04/17   Carloyn Manner, MD    Allergies Patient has no known allergies.  History reviewed. No pertinent family history.  Social History Social History   Tobacco Use  . Smoking status: Former Research scientist (life sciences)  . Smokeless tobacco: Former Systems developer  . Tobacco comment: quit 30 years ago  Substance Use Topics  . Alcohol use: Yes    Comment: occassional  . Drug use: No    Review of Systems  Constitutional: No fever/chills Eyes: No visual changes. ENT: No sore throat. Cardiovascular: Denies chest pain. Respiratory: Denies shortness of breath. Gastrointestinal: No abdominal pain.  No nausea, no vomiting.  No diarrhea.  No constipation. Genitourinary: Negative for dysuria. Musculoskeletal: Negative for back pain. Skin: Negative for rash. Neurological: Negative for headaches, focal weakness   ____________________________________________   PHYSICAL EXAM:  VITAL SIGNS: ED Triage Vitals  Enc Vitals Group     BP 12/14/17 1715 (!) 145/91     Pulse Rate 12/14/17 1715 (!) 139     Resp 12/14/17 1715 18     Temp 12/14/17 1715 99.1 F (37.3 C)     Temp Source 12/14/17 1715 Oral     SpO2 12/14/17 1715 94 %     Weight 12/14/17 1718 222 lb (100.7 kg)     Height 12/14/17 1718 5' 2.5" (1.588 m)     Head Circumference --      Peak Flow --      Pain Score 12/14/17 1718 0     Pain Loc --      Pain Edu? --      Excl. in [place]? --     Constitutional: Alert and oriented. Well appearing and in no acute distress. Eyes: Conjunctivae are normal.  Head: Atraumatic. Nose: No  congestion/rhinnorhea. Mouth/Throat: Mucous membranes are moist.  Oropharynx non-erythematous. Neck: No stridor. Cardiovascular: Rapid rate, irregular rhythm. Grossly normal heart sounds.  Good peripheral circulation. Respiratory: Normal respiratory effort.  No retractions. Lungs CTAB. Gastrointestinal: Soft and nontender. No distention. No abdominal bruits. No CVA tenderness. Musculoskeletal: No lower extremity tenderness  edema.  No joint effusions.  Patient does have some tenderness and swelling on top of the left foot which she says feels like her foot is broken. Neurologic:  Normal speech and language. No gross focal neurologic deficits are appreciated Skin:  Skin is warm, dry and intact. No rash noted. Psychiatric: Mood and affect are normal. Speech and behavior are normal.  ____________________________________________  LABS (all labs ordered are listed, but only abnormal results are displayed)  Labs Reviewed  COMPREHENSIVE METABOLIC PANEL - Abnormal; Notable for the following components:      Result Value   Glucose, Bld 111 (*)    ALT 13 (*)    All other components within normal limits  CBC WITH DIFFERENTIAL/PLATELET - Abnormal; Notable for the following components:   RDW 15.2 (*)    Platelets 114 (*)    All other components within normal limits  TSH - Abnormal; Notable for the following components:   TSH 26.222 (*)    All other components within normal limits  BRAIN NATRIURETIC PEPTIDE - Abnormal; Notable for the following components:   B Natriuretic Peptide 196.0 (*)    All other components within normal limits  TROPONIN I  FIBRIN DERIVATIVES D-DIMER ([facility] ONLY)   ____________________________________________  EKG  KG read interpreted by me shows what appears to be atrial fibrillation with rapid ventricular response at a rate of 133 slight left axis no acute ST-T wave changes heart rate on monitor varies from 120s to  150s ____________________________________________  RADIOLOGY  ED MD interpretation: Chest x-ray reviewed by me shows cardiomegaly possibly some increased vascular markings.  Left foot x-ray also reviewed by me does show a swelling radiology noticed I do not see anything else either.  Official radiology report(s): Dg Chest Portable 1 View  Result Date: 12/14/2017 CLINICAL DATA:  67 y/o  F; new onset atrial fibrillation. EXAM: PORTABLE CHEST 1 VIEW COMPARISON:  None. FINDINGS: Cardiomegaly. No consolidation, pneumothorax, or effusion. Left shoulder rotator cuff repair postsurgical changes. Bones are otherwise unremarkable. IMPRESSION: Cardiomegaly.  No acute pulmonary process identified. Electronically Signed   By: Kristine Garbe M.D.   On: 12/14/2017 17:51   Dg Foot Complete Left  Result Date: 12/14/2017 CLINICAL DATA:  LEFT foot pain and swelling.  Initial encounter. EXAM: LEFT FOOT - COMPLETE 3+ VIEW COMPARISON:  None. FINDINGS: There may be mild dorsal soft tissue swelling present. No acute fracture, subluxation or dislocation identified. No radiographic evidence of osteomyelitis noted. No radiopaque foreign bodies noted. IMPRESSION: Question dorsal soft tissue swelling.  No acute bony abnormality. Electronically Signed   By: Margarette Canada M.D.   On: 12/14/2017 18:10    ____________________________________________   PROCEDURES  Procedure(s) performed:   Procedures  Critical Care performed: critical care time 20 minutes this is spent with the adenosine and diltiazem trying to get her heart rate down.  ____________________________________________   INITIAL IMPRESSION / ASSESSMENT AND PLAN / ED COURSE  Patient given bolus of diltiazem and placed on a drip which is being titrated upwards.  Adenosine was tried twice caused brief episode of asystole but the heart rate picked right back up again.  The rate is irregularly irregular   Will admit her for A. fib with rapid  ventricular response.  On a diltiazem the heart rate occasionally will come down as low as 108   ____________________________________________   FINAL CLINICAL IMPRESSION(S) / ED DIAGNOSES  Final diagnoses:  Atrial fibrillation with rapid ventricular response [facility])     ED Discharge Orders    None       Note:  This document was prepared using Dragon voice recognition software and may include unintentional dictation errors.    Nena Polio, MD 12/14/17 2042    Nena Polio, MD 12/28/17 1330

## 2017-12-14 NOTE — ED Triage Notes (Signed)
Pt reports that she was in PT for her foot and they noticed that her heart rate got high and sent her up her to be evaluated. She denies Chest pain, or SOB. She reports that she has never had Afib before.

## 2017-12-15 ENCOUNTER — Observation Stay
Admit: 2017-12-15 | Discharge: 2017-12-15 | Disposition: A | Discharge status: Home / Self Care | Payer: Medicare HMO | Attending: Internal Medicine | Admitting: Internal Medicine

## 2017-12-15 LAB — CBC
HCT: 35 % (ref 35.0–47.0)
Hemoglobin: 11.9 g/dL — ABNORMAL LOW (ref 12.0–16.0)
MCH: 29.6 pg (ref 26.0–34.0)
MCHC: 34 g/dL (ref 32.0–36.0)
MCV: 87.1 fL (ref 80.0–100.0)
Platelets: 245 10*3/uL (ref 150–440)
RBC: 4.02 MIL/uL (ref 3.80–5.20)
RDW: 15.3 % — AB (ref 11.5–14.5)
WBC: 6.7 10*3/uL (ref 3.6–11.0)

## 2017-12-15 LAB — COMPREHENSIVE METABOLIC PANEL
ALBUMIN: 3.6 g/dL (ref 3.5–5.0)
ALT: 13 U/L — ABNORMAL LOW (ref 14–54)
AST: 15 U/L (ref 15–41)
Alkaline Phosphatase: 77 U/L (ref 38–126)
Anion gap: 7 (ref 5–15)
BUN: 13 mg/dL (ref 6–20)
CO2: 28 mmol/L (ref 22–32)
Calcium: 8.5 mg/dL — ABNORMAL LOW (ref 8.9–10.3)
Chloride: 103 mmol/L (ref 101–111)
Creatinine, Ser: 0.59 mg/dL (ref 0.44–1.00)
GFR calc Af Amer: >60 mL/min (ref >60)
Glucose, Bld: 119 mg/dL — ABNORMAL HIGH (ref 65–99)
Potassium: 3.5 mmol/L (ref 3.5–5.1)
SODIUM: 138 mmol/L (ref 135–145)
Total Bilirubin: 0.6 mg/dL (ref 0.3–1.2)
Total Protein: 6.2 g/dL — ABNORMAL LOW (ref 6.5–8.1)

## 2017-12-15 LAB — TROPONIN I: Troponin I: <0.03 ng/mL (ref <0.03)

## 2017-12-15 LAB — CREATININE, SERUM
CREATININE: 0.6 mg/dL (ref 0.44–1.00)
GFR calc Af Amer: >60 mL/min (ref >60)

## 2017-12-15 MED ORDER — DOCUSATE SODIUM 100 MG PO CAPS: 100.0000 mg | ORAL_CAPSULE | Freq: Every day | ORAL | 0 refills | Status: DC | PRN

## 2017-12-15 MED ORDER — APIXABAN 5 MG PO TABS: 5.0000 mg | ORAL_TABLET | Freq: Two times a day (BID) | ORAL | 0 refills | Status: DC

## 2017-12-15 MED ORDER — DILTIAZEM HCL 30 MG PO TABS
60.0000 mg | ORAL_TABLET | Freq: Three times a day (TID) | ORAL | Status: DC
  Administered 2017-12-15: 60 mg via ORAL
  Filled 2017-12-15: qty 2

## 2017-12-15 MED ORDER — METOPROLOL TARTRATE 25 MG PO TABS: 25.0000 mg | ORAL_TABLET | Freq: Two times a day (BID) | ORAL | 0 refills | Status: DC

## 2017-12-15 MED ORDER — DILTIAZEM HCL ER COATED BEADS 240 MG PO CP24: 240.0000 mg | ORAL_CAPSULE | Freq: Every day | ORAL | 0 refills | Status: DC

## 2017-12-15 MED ORDER — APIXABAN 5 MG PO TABS
5.0000 mg | ORAL_TABLET | Freq: Two times a day (BID) | ORAL | Status: DC
  Administered 2017-12-15: 5 mg via ORAL
  Filled 2017-12-15: qty 1

## 2017-12-15 MED ORDER — LEVOTHYROXINE SODIUM 200 MCG PO TABS: 200.0000 ug | ORAL_TABLET | Freq: Every day | ORAL | 0 refills | Status: DC

## 2017-12-15 MED ORDER — DILTIAZEM HCL ER COATED BEADS 240 MG PO CP24
240.0000 mg | ORAL_CAPSULE | Freq: Every day | ORAL | Status: DC
  Administered 2017-12-15: 240 mg via ORAL
  Filled 2017-12-15: qty 1

## 2017-12-15 NOTE — Consult Note (Signed)
[facility] Cardiology Consultation Note  Patient ID: Mandy Jones [facility], MRN: [id number], DOB/AGE: 05/04/1951 67 y.o. Admit date: 12/14/2017   Date of Consult: 12/15/2017 Primary Physician: Elaina Pattee, MD Primary Cardiologist: Nehemiah Massed  Chief Complaint:  Chief Complaint  Patient presents with  . Palpitations   Reason for Consult: Atrial fibrillation  HPI: 67 y.o. female with known essential hypertension mixed hyperlipidemia and previous thyroid abnormalities with significant hypothyroidism now having some significant issues with her lower extremities Achilles tendon injury and having to have rehabilitation.  During the rehabilitation the patient has had some irregular heartbeat and was seen in the emergency room.  At that time it appears that she has had atrial fibrillation with rapid ventricular rate for which she was given diltiazem drip and other multiple medications.  Patient now feels comfortable with no evidence of congestive heart failure anginal symptoms and has good heart rate control.  The patient will need no multiple medication management treatment for new onset atrial fibrillation  Past Medical History:  Diagnosis Date  . Arthritis   . Depression   . Dyspnea   . Dysrhythmia   . GERD (gastroesophageal reflux disease)   . Hepatitis    Hepatitis B 1985  . Mitral valve prolapse   . Sleep apnea    CPAP  . Thyroid dysfunction       Surgical History:  Past Surgical History:  Procedure Laterality Date  . CHOLECYSTECTOMY    . CONTINUOUS NERVE MONITORING N/A 08/03/2017   Procedure: LARYNGEAL NERVE MONITORING;  Surgeon: Carloyn Manner, MD;  Location: [facility] ORS;  Service: ENT;  Laterality: N/A;  . EYE SURGERY Bilateral   . FOOT SURGERY Left 2000  . JOINT REPLACEMENT     right knee  . SHOULDER ARTHROSCOPY Bilateral   . SHOULDER ARTHROSCOPY Left   . THYROIDECTOMY N/A 08/03/2017   Procedure: THYROIDECTOMY;  Surgeon: Carloyn Manner, MD;  Location: [facility] ORS;   Service: ENT;  Laterality: N/A;  . TONSILLECTOMY    . TUBAL LIGATION       Home Meds: Prior to Admission medications   Medication Sig Start Date End Date Taking? Authorizing Provider  aspirin EC 81 MG tablet Take 81 mg by mouth daily.   Yes [provider]  atorvastatin (LIPITOR) 10 MG tablet Take 10 mg by mouth daily. 05/05/17  Yes [provider]  b complex vitamins tablet Take 1 tablet by mouth daily.   Yes [provider]  buPROPion (WELLBUTRIN XL) 300 MG 24 hr tablet Take 300 mg by mouth daily.  05/10/17  Yes [provider]  calcium carbonate (TUMS - DOSED IN MG ELEMENTAL CALCIUM) 500 MG chewable tablet Chew 5 tablets (1,000 mg of elemental calcium total) by mouth 3 (three) times daily with meals. 08/04/17  Yes Vaught, Jeannie Fend, MD  cholecalciferol (VITAMIN D) 1000 units tablet Take 1,000 Units by mouth daily.   Yes [provider]  cycloSPORINE (RESTASIS) 0.05 % ophthalmic emulsion Place 2 drops into both eyes daily as needed (for dry eyes).   Yes [provider]  furosemide (LASIX) 20 MG tablet Take 20 mg by mouth daily. 05/19/17  Yes [provider]  KRILL OIL PO Take 1 capsule by mouth 2 (two) times daily.   Yes [provider]  levothyroxine (SYNTHROID, LEVOTHROID) 100 MCG tablet Take 1 tablet (100 mcg total) by mouth daily before breakfast. Patient taking differently: Take 150 mcg by mouth daily before breakfast.  08/05/17  Yes Vaught, Jeannie Fend, MD  Melatonin 5  MG TABS Take 1 tablet by mouth at bedtime.   Yes [provider]  meloxicam (MOBIC) 15 MG tablet Take 15 mg by mouth daily. 11/30/17  Yes [provider]  multivitamin-lutein (OCUVITE-LUTEIN) CAPS capsule Take 1 capsule by mouth daily.   Yes [provider]  ranitidine (ZANTAC) 150 MG tablet Take 300 mg by mouth every evening. 10/05/16  Yes [provider]  traZODone (DESYREL) 50 MG tablet Take 25 mg by mouth at bedtime as  needed. 12/04/17  Yes [provider]  HYDROcodone-acetaminophen (HYCET) 7.5-325 mg/15 ml solution Take 10-15 mLs by mouth every 4 (four) hours as needed for moderate pain. Patient not taking: Reported on 12/14/2017 08/04/17   Carloyn Manner, MD  ondansetron (ZOFRAN) 4 MG tablet Take 1 tablet (4 mg total) by mouth every 4 (four) hours as needed for nausea. Patient not taking: Reported on 12/14/2017 08/04/17   Carloyn Manner, MD    Inpatient Medications:  . aspirin EC  81 mg Oral Daily  . atorvastatin  10 mg Oral Daily  . buPROPion  300 mg Oral Daily  . cholecalciferol  1,000 Units Oral Daily  . diltiazem  60 mg Oral Q8H  . docusate sodium  100 mg Oral BID  . enoxaparin (LOVENOX) injection  40 mg Subcutaneous Q24H  . furosemide  20 mg Oral Daily  . levothyroxine  200 mcg Oral QAC breakfast  . Melatonin  1 tablet Oral QHS  . meloxicam  15 mg Oral Daily  . metoprolol tartrate  25 mg Oral BID  . multivitamin-lutein  1 capsule Oral Daily   . sodium chloride 75 mL/hr at 12/15/17 0010  . famotidine (PEPCID) IV 20 mg (12/15/17 9983)    Allergies: No Known Allergies  Social History   Socioeconomic History  . Marital status: Divorced    Spouse name: Not on file  . Number of children: Not on file  . Years of education: Not on file  . Highest education level: Not on file  Occupational History  . Not on file  Social Needs  . Financial resource strain: Not on file  . Food insecurity:    Worry: Not on file    Inability: Not on file  . Transportation needs:    Medical: Not on file    Non-medical: Not on file  Tobacco Use  . Smoking status: Former Research scientist (life sciences)  . Smokeless tobacco: Former Systems developer  . Tobacco comment: quit 30 years ago  Substance and Sexual Activity  . Alcohol use: Yes    Comment: occassional  . Drug use: No  . Sexual activity: Not on file  Lifestyle  . Physical activity:    Days per week: Not on file    Minutes per session: Not on file  . Stress: Not on file   Relationships  . Social connections:    Talks on phone: Not on file    Gets together: Not on file    Attends religious service: Not on file    Active member of club or organization: Not on file    Attends meetings of clubs or organizations: Not on file    Relationship status: Not on file  . Intimate partner violence:    Fear of current or ex partner: Not on file    Emotionally abused: Not on file    Physically abused: Not on file    Forced sexual activity: Not on file  Other Topics Concern  . Not on file  Social History Narrative  .  Not on file     History reviewed. No pertinent family history.   Review of Systems Positive for rotations Negative for: General:  chills, fever, night sweats or weight changes.  Cardiovascular: PND orthopnea syncope dizziness  Dermatological skin lesions rashes Respiratory: Cough congestion Urologic: Frequent urination urination at night and hematuria Abdominal: negative for nausea, vomiting, diarrhea, bright red blood per rectum, melena, or hematemesis Neurologic: negative for visual changes, and/or hearing changes  All other systems reviewed and are otherwise negative except as noted above.  Labs: Recent Labs    12/14/17 1722 12/14/17 2302 12/15/17 0453  TROPONINI <0.03 <0.03 <0.03   Lab Results  Component Value Date   WBC 6.7 12/15/2017   HGB 11.9 (L) 12/15/2017   HCT 35.0 12/15/2017   MCV 87.1 12/15/2017   PLT 245 12/15/2017    Recent Labs  Lab 12/15/17 0453  NA 138  K 3.5  CL 103  CO2 28  BUN 13  CREATININE 0.59  CALCIUM 8.5*  PROT 6.2*  BILITOT 0.6  ALKPHOS 77  ALT 13*  AST 15  GLUCOSE 119*   No results found for: CHOL, HDL, LDLCALC, TRIG No results found for: DDIMER  Radiology/Studies:  Dg Chest Portable 1 View  Result Date: 12/14/2017 CLINICAL DATA:  67 y/o  F; new onset atrial fibrillation. EXAM: PORTABLE CHEST 1 VIEW COMPARISON:  None. FINDINGS: Cardiomegaly. No consolidation, pneumothorax, or effusion.  Left shoulder rotator cuff repair postsurgical changes. Bones are otherwise unremarkable. IMPRESSION: Cardiomegaly.  No acute pulmonary process identified. Electronically Signed   By: Kristine Garbe M.D.   On: 12/14/2017 17:51   Dg Foot Complete Left  Result Date: 12/14/2017 CLINICAL DATA:  LEFT foot pain and swelling.  Initial encounter. EXAM: LEFT FOOT - COMPLETE 3+ VIEW COMPARISON:  None. FINDINGS: There may be mild dorsal soft tissue swelling present. No acute fracture, subluxation or dislocation identified. No radiographic evidence of osteomyelitis noted. No radiopaque foreign bodies noted. IMPRESSION: Question dorsal soft tissue swelling.  No acute bony abnormality. Electronically Signed   By: Margarette Canada M.D.   On: 12/14/2017 18:10    EKG: Atrial fibrillation with rapid ventricular rate  Weights: Filed Weights   12/14/17 1718 12/14/17 2207 12/15/17 0200  Weight: 222 lb (100.7 kg) 220 lb (99.8 kg) 221 lb 6.4 oz (100.4 kg)     Physical Exam: Blood pressure 118/66, pulse 71, temperature 98.3 F (36.8 C), temperature source Oral, resp. rate 18, height 5' 2.5" (1.588 m), weight 221 lb 6.4 oz (100.4 kg), SpO2 91 %. Body mass index is 39.85 kg/m. General: Well developed, well nourished, in no acute distress. Head eyes ears nose throat: Normocephalic, atraumatic, sclera non-icteric, no xanthomas, nares are without discharge. No apparent thyromegaly and/or mass  Lungs: Normal respiratory effort.  no wheezes, no rales, no rhonchi.  Heart: Irregular with normal S1 S2. no murmur gallop, no rub, PMI is normal size and placement, carotid upstroke normal without bruit, jugular venous pressure is normal Abdomen: Soft, non-tender, non-distended with normoactive bowel sounds. No hepatomegaly. No rebound/guarding. No obvious abdominal masses. Abdominal aorta is normal size without bruit Extremities: No edema. no cyanosis, no clubbing, no ulcers  Peripheral : 2+ bilateral upper extremity  pulses, 2+ bilateral femoral pulses, 2+ bilateral dorsal pedal pulse Neuro: Alert and oriented. No facial asymmetry. No focal deficit. Moves all extremities spontaneously. Musculoskeletal: Normal muscle tone without kyphosis Psych:  Responds to questions appropriately with a normal affect.    Assessment: Next a 39-year-old female with atrial  fibrillation with rapid ventricular rate and no current evidence of heart failure or myocardial infarction  Plan: 1.  Continue diltiazem long-acting at 240 mg plus metoprolol low-dose for heart rate control and possible spontaneous conversion to normal sinus rhythm 2.  Coagulation for further risk reduction and stroke with atrial fibrillation 3.  And follow for adjustments of medication management 4.  Okay for discharge home his heart rate controlled with outpatient adjustments and further evaluation and treatment options at that  Signed, Corey Skains M.D. [facility] 12/15/2017, 10:18 AM

## 2017-12-15 NOTE — Progress Notes (Signed)
Aasiyah Auerbach Strojny to be D/C'd Home per MD order.  Discussed prescriptions and follow up appointments with the patient. Prescriptions given to patient, medication list explained in detail. Pt verbalized understanding.  Allergies as of 12/15/2017   No Known Allergies     Medication List    STOP taking these medications   aspirin EC 81 MG tablet   HYDROcodone-acetaminophen 7.5-325 mg/15 ml solution Commonly known as:  HYCET   meloxicam 15 MG tablet Commonly known as:  MOBIC   ondansetron 4 MG tablet Commonly known as:  ZOFRAN     TAKE these medications   apixaban 5 MG Tabs tablet Commonly known as:  ELIQUIS Take 1 tablet (5 mg total) by mouth 2 (two) times daily.   atorvastatin 10 MG tablet Commonly known as:  LIPITOR Take 10 mg by mouth daily.   b complex vitamins tablet Take 1 tablet by mouth daily.   buPROPion 300 MG 24 hr tablet Commonly known as:  WELLBUTRIN XL Take 300 mg by mouth daily.   calcium carbonate 500 MG chewable tablet Commonly known as:  TUMS - dosed in mg elemental calcium Chew 5 tablets (1,000 mg of elemental calcium total) by mouth 3 (three) times daily with meals.   cholecalciferol 1000 units tablet Commonly known as:  VITAMIN D Take 1,000 Units by mouth daily.   cycloSPORINE 0.05 % ophthalmic emulsion Commonly known as:  RESTASIS Place 2 drops into both eyes daily as needed (for dry eyes).   diltiazem 240 MG 24 hr capsule Commonly known as:  CARDIZEM CD Take 1 capsule (240 mg total) by mouth daily. Start taking on:  12/16/2017   docusate sodium 100 MG capsule Commonly known as:  COLACE Take 1 capsule (100 mg total) by mouth daily as needed for mild constipation.   furosemide 20 MG tablet Commonly known as:  LASIX Take 20 mg by mouth daily.   KRILL OIL PO Take 1 capsule by mouth 2 (two) times daily.   levothyroxine 200 MCG tablet Commonly known as:  SYNTHROID, LEVOTHROID Take 1 tablet (200 mcg total) by mouth daily before  breakfast. Start taking on:  12/16/2017 What changed:    medication strength  how much to take   Melatonin 5 MG Tabs Take 1 tablet by mouth at bedtime.   metoprolol tartrate 25 MG tablet Commonly known as:  LOPRESSOR Take 1 tablet (25 mg total) by mouth 2 (two) times daily.   multivitamin-lutein Caps capsule Take 1 capsule by mouth daily.   ranitidine 150 MG tablet Commonly known as:  ZANTAC Take 300 mg by mouth every evening.   traZODone 50 MG tablet Commonly known as:  DESYREL Take 25 mg by mouth at bedtime as needed.       Vitals:   12/15/17 0839 12/15/17 1153  BP: 118/66 125/73  Pulse: 71 70  Resp: 18 18  Temp: 98.3 F (36.8 C)   SpO2: 91% 94%    Tele box removed and returned. Skin clean, dry and intact without evidence of skin break down, no evidence of skin tears noted. IV catheter discontinued intact. Site without signs and symptoms of complications. Dressing and pressure applied. Pt denies pain at this time. No complaints noted.  An After Visit Summary was printed and given to the patient. Patient escorted via [place], and D/C home via private auto.  Rolley Sims

## 2017-12-15 NOTE — Discharge Instructions (Signed)
Follow-up with primary care physician in 1 week Follow-up with cardiology Dr. Nehemiah Massed in 1 week

## 2017-12-15 NOTE — Plan of Care (Signed)
  Problem: Education: Goal: Knowledge of General Education information will improve Outcome: Progressing   Problem: Health Behavior/Discharge Planning: Goal: Ability to manage health-related needs will improve Outcome: Progressing   Problem: Clinical Measurements: Goal: Diagnostic test results will improve Outcome: Progressing Goal: Cardiovascular complication will be avoided Outcome: Progressing   Problem: Education: Goal: Knowledge of disease or condition will improve Outcome: Progressing Goal: Understanding of medication regimen will improve Outcome: Progressing   Problem: Cardiac: Goal: Ability to achieve and maintain adequate cardiopulmonary perfusion will improve Outcome: Progressing

## 2017-12-15 NOTE — Care Management Obs Status (Signed)
[facility] NOTIFICATION   Patient Details  Name: Mandy Jones MRN: [id number] Date of Birth: Dec 19, 1950   Medicare Observation Status Notification Given:  Yes    Shelbie Ammons, RN 12/15/2017, 1:23 PM
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
## 2017-12-15 NOTE — Progress Notes (Signed)
*  PRELIMINARY RESULTS* Echocardiogram 2D Echocardiogram has been performed.  Mandy Jones 12/15/2017, 10:08 AM

## 2017-12-15 NOTE — Care Management (Signed)
Discharge to home today per Dr Margaretmary Eddy. Eliquis coupon given. MOON letter explained and signed Shelbie Ammons RN MSN [place] Management [phone]

## 2017-12-15 NOTE — Discharge Summary (Signed)
Mandy Jones at [place] [facility] NAME: Mandy Jones    MR#:  [id number]  DATE OF BIRTH:  1951-08-05  DATE OF ADMISSION:  12/14/2017 ADMITTING PHYSICIAN: Idelle Crouch, MD  DATE OF DISCHARGE:  12/15/17  PRIMARY CARE PHYSICIAN: Elaina Pattee, MD    ADMISSION DIAGNOSIS:  Atrial fibrillation with rapid ventricular response ([place]) [I48.91]  DISCHARGE DIAGNOSIS:  Principal Problem:   Atrial fibrillation with RVR ([place]) Active Problems:   Hypothyroidism   Valvular heart disease   Muscle cramps   SECONDARY DIAGNOSIS:   Past Medical History:  Diagnosis Date  . Arthritis   . Depression   . Dyspnea   . Dysrhythmia   . GERD (gastroesophageal reflux disease)   . Hepatitis    Hepatitis B 1985  . Mitral valve prolapse   . Sleep apnea    CPAP  . Thyroid dysfunction     HOSPITAL COURSE:   HPI: Mandy Jones is a 67 y.o. female has a past medical history significant for valvular heart disease and s/p thyroidectomy on Synthroid who presents to ER with diffuse cramping. In ER, pt noted to be in A-fib with RVR. Also noted to have TSH=26. In ER, pt started on Diltiazem drip with little to no improvement of rate. She is now admitted. Denies CP or SOB. No palpitations. Afebrile. No N/V/D.   Atrial fibrillation with RVR ([place]) could be from underlying uncontrolled hypothyroidism Patient was given Cardizem drip and heart rate is well controlled Started on p.o. Cardizem will discharge with Cardizem CD 240 mg once daily Aspirin discontinued and patient is started on Eliquis, discharged home with Eliquis Discontinued baby aspirin Discussed with cardiology Dr. Nehemiah Massed okay to discharge patient from cardiology standpoint and outpatient follow-up in 1 week is recommended Echocardiogram-study done results need to be followed up by cardiology Ambulatory heart rate 75-85  Hypothyroidism TSH is elevated at 26 Synthroid dose increased from 150  mcg to 200 mcg once daily PCP to follow-up     Muscle cramps-resolved  Obesity lifestyle changes once clinically stable     DISCHARGE CONDITIONS:   Stable  CONSULTS OBTAINED:  Treatment Team:  Corey Skains, MD   PROCEDURES -none  DRUG ALLERGIES:  No Known Allergies  DISCHARGE MEDICATIONS:   Allergies as of 12/15/2017   No Known Allergies     Medication List    STOP taking these medications   aspirin EC 81 MG tablet   HYDROcodone-acetaminophen 7.5-325 mg/15 ml solution Commonly known as:  HYCET   meloxicam 15 MG tablet Commonly known as:  MOBIC   ondansetron 4 MG tablet Commonly known as:  ZOFRAN     TAKE these medications   apixaban 5 MG Tabs tablet Commonly known as:  ELIQUIS Take 1 tablet (5 mg total) by mouth 2 (two) times daily.   atorvastatin 10 MG tablet Commonly known as:  LIPITOR Take 10 mg by mouth daily.   b complex vitamins tablet Take 1 tablet by mouth daily.   buPROPion 300 MG 24 hr tablet Commonly known as:  WELLBUTRIN XL Take 300 mg by mouth daily.   calcium carbonate 500 MG chewable tablet Commonly known as:  TUMS - dosed in mg elemental calcium Chew 5 tablets (1,000 mg of elemental calcium total) by mouth 3 (three) times daily with meals.   cholecalciferol 1000 units tablet Commonly known as:  VITAMIN D Take 1,000 Units by mouth daily.   cycloSPORINE 0.05 % ophthalmic emulsion Commonly known  as:  RESTASIS Place 2 drops into both eyes daily as needed (for dry eyes).   diltiazem 240 MG 24 hr capsule Commonly known as:  CARDIZEM CD Take 1 capsule (240 mg total) by mouth daily. Start taking on:  12/16/2017   docusate sodium 100 MG capsule Commonly known as:  COLACE Take 1 capsule (100 mg total) by mouth daily as needed for mild constipation.   furosemide 20 MG tablet Commonly known as:  LASIX Take 20 mg by mouth daily.   KRILL OIL PO Take 1 capsule by mouth 2 (two) times daily.   levothyroxine 200 MCG  tablet Commonly known as:  SYNTHROID, LEVOTHROID Take 1 tablet (200 mcg total) by mouth daily before breakfast. Start taking on:  12/16/2017 What changed:    medication strength  how much to take   Melatonin 5 MG Tabs Take 1 tablet by mouth at bedtime.   metoprolol tartrate 25 MG tablet Commonly known as:  LOPRESSOR Take 1 tablet (25 mg total) by mouth 2 (two) times daily.   multivitamin-lutein Caps capsule Take 1 capsule by mouth daily.   ranitidine 150 MG tablet Commonly known as:  ZANTAC Take 300 mg by mouth every evening.   traZODone 50 MG tablet Commonly known as:  DESYREL Take 25 mg by mouth at bedtime as needed.        DISCHARGE INSTRUCTIONS:   Follow-up with primary care physician in 1 week Follow-up with cardiology Dr. Nehemiah Massed in 1 week  DIET:  Cardiac diet   DISCHARGE CONDITION:  Stable  ACTIVITY:  Activity as tolerated  OXYGEN:  Home Oxygen: No.   Oxygen Delivery: room air  DISCHARGE LOCATION:  home   If you experience worsening of your admission symptoms, develop shortness of breath, life threatening emergency, suicidal or homicidal thoughts you must seek medical attention immediately by calling 911 or calling your MD immediately  if symptoms less severe.  You Must read complete instructions/literature along with all the possible adverse reactions/side effects for all the Medicines you take and that have been prescribed to you. Take any new Medicines after you have completely understood and accpet all the possible adverse reactions/side effects.   Please note  You were cared for by a hospitalist during your hospital stay. If you have any questions about your discharge medications or the care you received while you were in the hospital after you are discharged, you can call the unit and asked to speak with the hospitalist on call if the hospitalist that took care of you is not available. Once you are discharged, your primary care physician will  handle any further medical issues. Please note that NO REFILLS for any discharge medications will be authorized once you are discharged, as it is imperative that you return to your primary care physician (or establish a relationship with a primary care physician if you do not have one) for your aftercare needs so that they can reassess your need for medications and monitor your lab values.     Today  Chief Complaint  Patient presents with  . Palpitations   Patient is resting comfortably.  Denies any chest pain shortness of breath or palpitations.  Okay to discharge patient from cardiology standpoint  ROS:  CONSTITUTIONAL: Denies fevers, chills. Denies any fatigue, weakness.  EYES: Denies blurry vision, double vision, eye pain. EARS, NOSE, THROAT: Denies tinnitus, ear pain, hearing loss. RESPIRATORY: Denies cough, wheeze, shortness of breath.  CARDIOVASCULAR: Denies chest pain, palpitations, edema.  GASTROINTESTINAL: Denies nausea, vomiting,  diarrhea, abdominal pain. Denies bright red blood per rectum. GENITOURINARY: Denies dysuria, hematuria. ENDOCRINE: Denies nocturia or thyroid problems. HEMATOLOGIC AND LYMPHATIC: Denies easy bruising or bleeding. SKIN: Denies rash or lesion. MUSCULOSKELETAL: Denies pain in neck, back, shoulder, knees, hips or arthritic symptoms.  NEUROLOGIC: Denies paralysis, paresthesias.  PSYCHIATRIC: Denies anxiety or depressive symptoms.   VITAL SIGNS:  Blood pressure 125/73, pulse 70, temperature 98.3 F (36.8 C), temperature source Oral, resp. rate 18, height 5' 2.5" (1.588 m), weight 100.4 kg (221 lb 6.4 oz), SpO2 94 %.  I/O:    Intake/Output Summary (Last 24 hours) at 12/15/2017 1330 Last data filed at 12/15/2017 0953 Gross per 24 hour  Intake 452.5 ml  Output 1201 ml  Net -748.5 ml    PHYSICAL EXAMINATION:  GENERAL:  67 y.o.-year-old patient lying in the bed with no acute distress.  EYES: Pupils equal, round, reactive to light and accommodation.  No scleral icterus. Extraocular muscles intact.  HEENT: Head atraumatic, normocephalic. Oropharynx and nasopharynx clear.  NECK:  Supple, no jugular venous distention. No thyroid enlargement, no tenderness.  LUNGS: Normal breath sounds bilaterally, no wheezing, rales,rhonchi or crepitation. No use of accessory muscles of respiration.  CARDIOVASCULAR: Irregularly regular no murmurs, rubs, or gallops.  ABDOMEN: Soft, non-tender, non-distended. Bowel sounds present. No organomegaly or mass.  EXTREMITIES: No pedal edema, cyanosis, or clubbing.  NEUROLOGIC: Cranial nerves II through XII are intact. Muscle strength 5/5 in all extremities. Sensation intact. Gait not checked.  PSYCHIATRIC: The patient is alert and oriented x 3.  SKIN: No obvious rash, lesion, or ulcer.   DATA REVIEW:   CBC Recent Labs  Lab 12/15/17 0453  WBC 6.7  HGB 11.9*  HCT 35.0  PLT 245    Chemistries  Recent Labs  Lab 12/15/17 0453  NA 138  K 3.5  CL 103  CO2 28  GLUCOSE 119*  BUN 13  CREATININE 0.59  CALCIUM 8.5*  AST 15  ALT 13*  ALKPHOS 77  BILITOT 0.6    Cardiac Enzymes Recent Labs  Lab 12/15/17 1020  TROPONINI <0.03    Microbiology Results  No results found for this or any previous visit.  RADIOLOGY:  Dg Chest Portable 1 View  Result Date: 12/14/2017 CLINICAL DATA:  67 y/o  F; new onset atrial fibrillation. EXAM: PORTABLE CHEST 1 VIEW COMPARISON:  None. FINDINGS: Cardiomegaly. No consolidation, pneumothorax, or effusion. Left shoulder rotator cuff repair postsurgical changes. Bones are otherwise unremarkable. IMPRESSION: Cardiomegaly.  No acute pulmonary process identified. Electronically Signed   By: Kristine Garbe M.D.   On: 12/14/2017 17:51   Dg Foot Complete Left  Result Date: 12/14/2017 CLINICAL DATA:  LEFT foot pain and swelling.  Initial encounter. EXAM: LEFT FOOT - COMPLETE 3+ VIEW COMPARISON:  None. FINDINGS: There may be mild dorsal soft tissue swelling present. No  acute fracture, subluxation or dislocation identified. No radiographic evidence of osteomyelitis noted. No radiopaque foreign bodies noted. IMPRESSION: Question dorsal soft tissue swelling.  No acute bony abnormality. Electronically Signed   By: Margarette Canada M.D.   On: 12/14/2017 18:10    EKG:   Orders placed or performed during the hospital encounter of 12/14/17  . EKG 12-Lead  . EKG 12-Lead      Management plans discussed with the patient, family and they are in agreement.  CODE STATUS:     Code Status Orders  (From admission, onward)        Start     Ordered   12/14/17 2234  Full code  Continuous     12/14/17 2233    Code Status History    Date Active Date Inactive Code Status Order ID Comments User Context   08/03/2017 1106 08/04/2017 1843 Full Code [id number]  Carloyn Manner, MD Inpatient      TOTAL TIME TAKING CARE OF THIS PATIENT: 43  minutes.   Note: This dictation was prepared with Dragon dictation along with smaller phrase technology. Any transcriptional errors that result from this process are unintentional.   @[facility] @  on 12/15/2017 at 1:30 PM  Between 7am to 6pm - Pager - [phone]  After 6pm go to [email or web address] - password [email or web address] [facility]  [facility]  Office  [phone]  CC: Primary care physician; Elaina Pattee, MD

## 2017-12-15 NOTE — Progress Notes (Signed)
Notified MD of pt's heartrate as low as 59, but not sustained, still a-fib. Cardizem IV d/c'd, transitioned to PO cardizem. Will continue to monitor and assess.

## 2017-12-15 NOTE — Consult Note (Signed)
ANTICOAGULATION CONSULT NOTE - Initial Consult  Pharmacy Consult for Apixaban  Indication: atrial fibrillation  No Known Allergies  Patient Measurements: Height: 5' 2.5" (158.8 cm) Weight: 221 lb 6.4 oz (100.4 kg) IBW/kg (Calculated) : 51.25  Vital Signs: Temp: 98.3 F (36.8 C) (04/19 0839) Temp Source: Oral (04/19 0839) BP: 118/66 (04/19 0839) Pulse Rate: 71 (04/19 0839)  Labs: Estimated Creatinine Clearance: 74.3 mL/min (by C-G formula based on SCr of 0.59 mg/dL).  Medical History: Past Medical History:  Diagnosis Date  . Arthritis   . Depression   . Dyspnea   . Dysrhythmia   . GERD (gastroesophageal reflux disease)   . Hepatitis    Hepatitis B 1985  . Mitral valve prolapse   . Sleep apnea    CPAP  . Thyroid dysfunction     Assessment: Pharmacy consulted for Apixaban dosing for 67 yo female for Atrial fibrillation w/RVR  Plan:  Start Apixaban 5mg  BID Pharmacy to counsel patient on apixaban prior to discharge.   Pernell Dupre, PharmD, BCPS Clinical Pharmacist 12/15/2017 10:46 AM

## 2017-12-15 NOTE — Progress Notes (Signed)
Patient ambulated around nurses station and tolerated well, HR remained in the 70-80's no complains of pain, sob. Will continue to monitor.

## 2017-12-15 NOTE — Progress Notes (Signed)
  Family Meeting Note  Advance Directive:yes  Today a meeting took place with the Patient.   The following clinical team members were present during this meeting:MD  The following were discussed:Patient's diagnosis: Atrial fibrillation with RVR, elevated TSH, hypothyroidism, treatment plan of care discussed in detail with the patient.  She verbalized understanding of the plan.  Patient's progosis: > 12 months and Goals for treatment: Full Code healthcare power of attorney friend [name]  Additional follow-up to be provided: Hospitalist and cardiology  Time spent during discussion:18 min  Nicholes Mango, MD

## 2017-12-16 LAB — ECHOCARDIOGRAM COMPLETE
Height: 62.5 in
WEIGHTICAEL: 3542.4 [oz_av]

## 2017-12-19 DIAGNOSIS — R739 Hyperglycemia, unspecified: Secondary | ICD-10-CM | POA: Insufficient documentation

## 2018-01-10 ENCOUNTER — Other Ambulatory Visit: Payer: Self-pay | Admitting: Podiatry

## 2018-01-10 DIAGNOSIS — M6788 Other specified disorders of synovium and tendon, other site: Secondary | ICD-10-CM

## 2018-01-24 ENCOUNTER — Ambulatory Visit: Payer: PPO

## 2018-01-30 ENCOUNTER — Ambulatory Visit
Admission: RE | Admit: 2018-01-30 | Discharge: 2018-01-30 | Disposition: A | Discharge status: Home / Self Care | Payer: Medicare HMO | Source: Ambulatory Visit | Attending: Podiatry | Admitting: Podiatry

## 2018-01-30 ENCOUNTER — Other Ambulatory Visit
Admission: RE | Admit: 2018-01-30 | Discharge: 2018-01-30 | Disposition: A | Discharge status: Home / Self Care | Payer: Medicare HMO | Source: Ambulatory Visit | Attending: Otolaryngology | Admitting: Otolaryngology

## 2018-01-30 DIAGNOSIS — X58XXXA Exposure to other specified factors, initial encounter: Secondary | ICD-10-CM | POA: Diagnosis not present

## 2018-01-30 DIAGNOSIS — M6788 Other specified disorders of synovium and tendon, other site: Secondary | ICD-10-CM

## 2018-01-30 DIAGNOSIS — M7661 Achilles tendinitis, right leg: Secondary | ICD-10-CM | POA: Insufficient documentation

## 2018-01-30 DIAGNOSIS — R600 Localized edema: Secondary | ICD-10-CM | POA: Insufficient documentation

## 2018-01-30 DIAGNOSIS — M19071 Primary osteoarthritis, right ankle and foot: Secondary | ICD-10-CM | POA: Diagnosis not present

## 2018-01-30 DIAGNOSIS — S86021A Laceration of right Achilles tendon, initial encounter: Secondary | ICD-10-CM | POA: Diagnosis not present

## 2018-01-30 LAB — TSH: TSH: 4.115 u[IU]/mL (ref 0.350–4.500)

## 2018-02-06 ENCOUNTER — Encounter
Admission: RE | Disposition: A | Discharge status: Home / Self Care | Payer: Self-pay | Source: Ambulatory Visit | Attending: Internal Medicine

## 2018-02-06 ENCOUNTER — Encounter: Payer: Self-pay | Admitting: *Deleted

## 2018-02-06 ENCOUNTER — Ambulatory Visit: Payer: Medicare HMO | Admitting: Certified Registered"

## 2018-02-06 ENCOUNTER — Ambulatory Visit
Admission: RE | Admit: 2018-02-06 | Discharge: 2018-02-06 | Disposition: A | Discharge status: Home / Self Care | Payer: Medicare HMO | Source: Ambulatory Visit | Attending: Internal Medicine | Admitting: Internal Medicine

## 2018-02-06 DIAGNOSIS — Z9989 Dependence on other enabling machines and devices: Secondary | ICD-10-CM | POA: Diagnosis not present

## 2018-02-06 DIAGNOSIS — Z8249 Family history of ischemic heart disease and other diseases of the circulatory system: Secondary | ICD-10-CM | POA: Diagnosis not present

## 2018-02-06 DIAGNOSIS — G4730 Sleep apnea, unspecified: Secondary | ICD-10-CM | POA: Insufficient documentation

## 2018-02-06 DIAGNOSIS — Z79890 Hormone replacement therapy (postmenopausal): Secondary | ICD-10-CM | POA: Insufficient documentation

## 2018-02-06 DIAGNOSIS — Z7901 Long term (current) use of anticoagulants: Secondary | ICD-10-CM | POA: Insufficient documentation

## 2018-02-06 DIAGNOSIS — I480 Paroxysmal atrial fibrillation: Secondary | ICD-10-CM | POA: Diagnosis not present

## 2018-02-06 DIAGNOSIS — Z79899 Other long term (current) drug therapy: Secondary | ICD-10-CM | POA: Diagnosis not present

## 2018-02-06 DIAGNOSIS — Z7951 Long term (current) use of inhaled steroids: Secondary | ICD-10-CM | POA: Insufficient documentation

## 2018-02-06 DIAGNOSIS — I4891 Unspecified atrial fibrillation: Secondary | ICD-10-CM | POA: Diagnosis present

## 2018-02-06 DIAGNOSIS — Z87891 Personal history of nicotine dependence: Secondary | ICD-10-CM | POA: Insufficient documentation

## 2018-02-06 HISTORY — PX: CARDIOVERSION: EP1203

## 2018-02-06 HISTORY — DX: Strain of unspecified muscle and tendon at ankle and foot level, unspecified foot, initial encounter: S96.919A

## 2018-02-06 SURGERY — CARDIOVERSION (CATH LAB)
Anesthesia: General

## 2018-02-06 MED ORDER — ONDANSETRON HCL 4 MG/2ML IJ SOLN: 4.0000 mg | Freq: Once | INTRAMUSCULAR | Status: DC | PRN

## 2018-02-06 MED ORDER — SODIUM CHLORIDE 0.9 % IV SOLN: INTRAVENOUS | Status: DC

## 2018-02-06 MED ORDER — FENTANYL CITRATE (PF) 100 MCG/2ML IJ SOLN: 25.0000 ug | INTRAMUSCULAR | Status: DC | PRN

## 2018-02-06 MED ORDER — PHENYLEPHRINE HCL 10 MG/ML IJ SOLN
INTRAMUSCULAR | Status: AC
  Filled 2018-02-06: qty 1

## 2018-02-06 MED ORDER — PROPOFOL 10 MG/ML IV BOLUS
INTRAVENOUS | Status: AC
  Filled 2018-02-06: qty 20

## 2018-02-06 MED ORDER — PROPOFOL 10 MG/ML IV BOLUS
INTRAVENOUS | Status: DC | PRN
  Administered 2018-02-06: 60 mg via INTRAVENOUS
  Administered 2018-02-06: 40 mg via INTRAVENOUS

## 2018-02-06 NOTE — Transfer of Care (Signed)
Immediate Anesthesia Transfer of Care Note  Patient: Okema [facility]  Procedure(s) Performed: CARDIOVERSION (N/A )  Patient Location: PACU  Anesthesia Type:General  Level of Consciousness: sedated  Airway & Oxygen Therapy: Patient Spontanous Breathing and Patient connected to nasal cannula oxygen  Post-op Assessment: Report given to RN and Post -op Vital signs reviewed and stable  Post vital signs: Reviewed and stable  Last Vitals:  Vitals Value Taken Time  BP 115/71 02/06/2018  7:40 AM  Temp    Pulse 63 02/06/2018  7:40 AM  Resp 21 02/06/2018  7:40 AM  SpO2 92 % 02/06/2018  7:40 AM  Vitals shown include unvalidated device data.  Last Pain:  Vitals:   02/06/18 0634  PainSc: 8          Complications: No apparent anesthesia complications

## 2018-02-06 NOTE — Anesthesia Post-op Follow-up Note (Signed)
Anesthesia QCDR form completed.        

## 2018-02-06 NOTE — Anesthesia Preprocedure Evaluation (Signed)
Anesthesia Evaluation  Patient identified by MRN, date of birth, ID band Patient awake    Reviewed: Allergy & Precautions, H&P , NPO status , Patient's Chart, lab work & pertinent test results, reviewed documented beta blocker date and time   Airway Mallampati: II   Neck ROM: full    Dental  (+) Poor Dentition, Teeth Intact   Pulmonary neg pulmonary ROS, shortness of breath and with exertion, sleep apnea , former smoker,    Pulmonary exam normal        Cardiovascular Exercise Tolerance: Poor negative cardio ROS Normal cardiovascular exam+ dysrhythmias Atrial Fibrillation  Rhythm:regular Rate:Normal     Neuro/Psych PSYCHIATRIC DISORDERS Depression negative neurological ROS  negative psych ROS   GI/Hepatic negative GI ROS, Neg liver ROS, GERD  ,(+) Hepatitis -  Endo/Other  negative endocrine ROSHypothyroidism   Renal/GU negative Renal ROS  negative genitourinary   Musculoskeletal   Abdominal   Peds  Hematology negative hematology ROS (+)   Anesthesia Other Findings Past Medical History: No date: Arthritis No date: Depression No date: Dyspnea No date: Dysrhythmia No date: GERD (gastroesophageal reflux disease) No date: Hepatitis     Comment:  Hepatitis B 1985 No date: Mitral valve prolapse No date: Sleep apnea     Comment:  CPAP No date: Tendon tear, ankle     Comment:  rt foot No date: Thyroid dysfunction Past Surgical History: No date: CHOLECYSTECTOMY 08/03/2017: CONTINUOUS NERVE MONITORING; N/A     Comment:  Procedure: LARYNGEAL NERVE MONITORING;  Surgeon: Carloyn Manner, MD;  Location: [facility] ORS;  Service: ENT;                Laterality: N/A; No date: EYE SURGERY; Bilateral 2000: FOOT SURGERY; Left No date: JOINT REPLACEMENT     Comment:  right knee No date: SHOULDER ARTHROSCOPY; Bilateral No date: SHOULDER ARTHROSCOPY; Left 08/03/2017: THYROIDECTOMY; N/A     Comment:  Procedure:  THYROIDECTOMY;  Surgeon: Carloyn Manner,               MD;  Location: [facility] ORS;  Service: ENT;  Laterality: N/A; No date: TONSILLECTOMY No date: TUBAL LIGATION BMI    Body Mass Index:  39.96 kg/m     Reproductive/Obstetrics negative OB ROS                             Anesthesia Physical Anesthesia Plan  ASA: IV  Anesthesia Plan: General   Post-op Pain Management:    Induction:   PONV Risk Score and Plan:   Airway Management Planned:   Additional Equipment:   Intra-op Plan:   Post-operative Plan:   Informed Consent: I have reviewed the patients History and Physical, chart, labs and discussed the procedure including the risks, benefits and alternatives for the proposed anesthesia with the patient or authorized representative who has indicated his/her understanding and acceptance.   Dental Advisory Given  Plan Discussed with: CRNA  Anesthesia Plan Comments:         Anesthesia Quick Evaluation

## 2018-02-06 NOTE — CV Procedure (Signed)
Electrical Cardioversion Procedure Note Mandy Jones [facility] [id number] [birth year]  Procedure: Electrical Cardioversion Indications:  Atrial Fibrillation  Procedure Details Consent: Risks of procedure as well as the alternatives and risks of each were explained to the (patient/caregiver).  Consent for procedure obtained. Time Out: Verified patient identification, verified procedure, site/side was marked, verified correct patient position, special equipment/implants available, medications/allergies/relevent history reviewed, required imaging and test results available.  Performed  Patient placed on cardiac monitor, pulse oximetry, supplemental oxygen as necessary.  Sedation given: Short-acting barbiturates Pacer pads placed anterior and posterior chest.  Cardioverted 1 time(s).  Cardioverted at 120J.  Evaluation Findings: Post procedure EKG shows: NSR Complications: None Patient did tolerate procedure well.   Corey Skains 02/06/2018, 8:23 AM

## 2018-02-06 NOTE — Anesthesia Procedure Notes (Signed)
Performed by: Tahnee Cifuentes, CRNA Pre-anesthesia Checklist: Patient identified, Emergency Drugs available, Suction available, Patient being monitored and Timeout performed Patient Re-evaluated:Patient Re-evaluated prior to induction Oxygen Delivery Method: Nasal cannula Induction Type: IV induction       

## 2018-02-11 NOTE — Anesthesia Postprocedure Evaluation (Signed)
Anesthesia Post Note  Patient: Tiegan Jambor [facility]  Procedure(s) Performed: CARDIOVERSION (N/A )  Patient location during evaluation: PACU Anesthesia Type: General Level of consciousness: awake and alert Pain management: pain level controlled Vital Signs Assessment: post-procedure vital signs reviewed and stable Respiratory status: spontaneous breathing, nonlabored ventilation, respiratory function stable and patient connected to nasal cannula oxygen Cardiovascular status: blood pressure returned to baseline and stable Postop Assessment: no apparent nausea or vomiting Anesthetic complications: no     Last Vitals:  Vitals:   02/06/18 0800 02/06/18 0815  BP: 105/67 (!) 104/51  Pulse: 69 63  Resp: (!) 22 20  Temp:    SpO2: 92% 93%    Last Pain:  Vitals:   02/06/18 0815  PainSc: 0-No pain                 Molli Barrows

## 2018-03-20 DIAGNOSIS — E7800 Pure hypercholesterolemia, unspecified: Secondary | ICD-10-CM | POA: Insufficient documentation

## 2018-03-20 DIAGNOSIS — G4733 Obstructive sleep apnea (adult) (pediatric): Secondary | ICD-10-CM | POA: Insufficient documentation

## 2018-03-27 ENCOUNTER — Inpatient Hospital Stay: Admission: RE | Admit: 2018-03-27 | Payer: Medicare HMO | Source: Ambulatory Visit

## 2018-03-28 ENCOUNTER — Ambulatory Visit: Payer: Medicare HMO | Admitting: Certified Registered Nurse Anesthetist

## 2018-03-28 ENCOUNTER — Ambulatory Visit
Admission: RE | Admit: 2018-03-28 | Discharge: 2018-03-28 | Disposition: A | Discharge status: Home / Self Care | Payer: Medicare HMO | Source: Ambulatory Visit | Attending: Internal Medicine | Admitting: Internal Medicine

## 2018-03-28 ENCOUNTER — Encounter
Admission: RE | Disposition: A | Discharge status: Home / Self Care | Payer: Self-pay | Source: Ambulatory Visit | Attending: Internal Medicine

## 2018-03-28 DIAGNOSIS — I341 Nonrheumatic mitral (valve) prolapse: Secondary | ICD-10-CM | POA: Diagnosis not present

## 2018-03-28 DIAGNOSIS — Z9989 Dependence on other enabling machines and devices: Secondary | ICD-10-CM | POA: Diagnosis not present

## 2018-03-28 DIAGNOSIS — G4730 Sleep apnea, unspecified: Secondary | ICD-10-CM | POA: Diagnosis not present

## 2018-03-28 DIAGNOSIS — Z7901 Long term (current) use of anticoagulants: Secondary | ICD-10-CM | POA: Insufficient documentation

## 2018-03-28 DIAGNOSIS — M797 Fibromyalgia: Secondary | ICD-10-CM | POA: Insufficient documentation

## 2018-03-28 DIAGNOSIS — K759 Inflammatory liver disease, unspecified: Secondary | ICD-10-CM | POA: Diagnosis not present

## 2018-03-28 DIAGNOSIS — F329 Major depressive disorder, single episode, unspecified: Secondary | ICD-10-CM | POA: Diagnosis not present

## 2018-03-28 DIAGNOSIS — Z87891 Personal history of nicotine dependence: Secondary | ICD-10-CM | POA: Insufficient documentation

## 2018-03-28 DIAGNOSIS — K219 Gastro-esophageal reflux disease without esophagitis: Secondary | ICD-10-CM | POA: Diagnosis not present

## 2018-03-28 DIAGNOSIS — E039 Hypothyroidism, unspecified: Secondary | ICD-10-CM | POA: Insufficient documentation

## 2018-03-28 DIAGNOSIS — E785 Hyperlipidemia, unspecified: Secondary | ICD-10-CM | POA: Diagnosis not present

## 2018-03-28 DIAGNOSIS — Z79899 Other long term (current) drug therapy: Secondary | ICD-10-CM | POA: Insufficient documentation

## 2018-03-28 DIAGNOSIS — R011 Cardiac murmur, unspecified: Secondary | ICD-10-CM | POA: Diagnosis not present

## 2018-03-28 DIAGNOSIS — I4891 Unspecified atrial fibrillation: Secondary | ICD-10-CM | POA: Diagnosis present

## 2018-03-28 DIAGNOSIS — M19071 Primary osteoarthritis, right ankle and foot: Secondary | ICD-10-CM | POA: Diagnosis not present

## 2018-03-28 HISTORY — PX: CARDIOVERSION: EP1203

## 2018-03-28 SURGERY — CARDIOVERSION (CATH LAB)
Anesthesia: Monitor Anesthesia Care

## 2018-03-28 MED ORDER — MIDAZOLAM HCL 2 MG/2ML IJ SOLN
INTRAMUSCULAR | Status: DC | PRN
  Administered 2018-03-28: 2 mg via INTRAVENOUS

## 2018-03-28 MED ORDER — PROPOFOL 10 MG/ML IV BOLUS
INTRAVENOUS | Status: DC | PRN
  Administered 2018-03-28: 60 mg via INTRAVENOUS

## 2018-03-28 MED ORDER — PROPOFOL 10 MG/ML IV BOLUS
INTRAVENOUS | Status: AC
Start: 2018-03-28 — End: ?
  Filled 2018-03-28: qty 20

## 2018-03-28 MED ORDER — FENTANYL CITRATE (PF) 100 MCG/2ML IJ SOLN
INTRAMUSCULAR | Status: DC | PRN
  Administered 2018-03-28: 50 ug via INTRAVENOUS

## 2018-03-28 MED ORDER — LIDOCAINE HCL (CARDIAC) PF 100 MG/5ML IV SOSY
PREFILLED_SYRINGE | INTRAVENOUS | Status: DC | PRN
  Administered 2018-03-28: 50 mg via INTRATRACHEAL

## 2018-03-28 MED ORDER — LIDOCAINE HCL (PF) 2 % IJ SOLN
INTRAMUSCULAR | Status: AC
  Filled 2018-03-28: qty 10

## 2018-03-28 MED ORDER — FENTANYL CITRATE (PF) 100 MCG/2ML IJ SOLN
INTRAMUSCULAR | Status: AC
  Filled 2018-03-28: qty 2

## 2018-03-28 MED ORDER — SODIUM CHLORIDE 0.9 % IV SOLN
INTRAVENOUS | Status: DC
  Administered 2018-03-28: 08:00:00 via INTRAVENOUS

## 2018-03-28 MED ORDER — MIDAZOLAM HCL 2 MG/2ML IJ SOLN
INTRAMUSCULAR | Status: AC
Start: 2018-03-28 — End: ?
  Filled 2018-03-28: qty 2

## 2018-03-28 NOTE — Transfer of Care (Signed)
Immediate Anesthesia Transfer of Care Note  Patient: Mandy Jones [facility]  Procedure(s) Performed: CARDIOVERSION (N/A )  Patient Location: PACU and Cath Lab  Anesthesia Type:MAC  Level of Consciousness: drowsy  Airway & Oxygen Therapy: Patient Spontanous Breathing and Patient connected to nasal cannula oxygen  Post-op Assessment: Report given to RN and Post -op Vital signs reviewed and stable  Post vital signs: Reviewed and stable  Last Vitals:  Vitals Value Taken Time  BP 99/58 03/28/2018  7:42 AM  Temp    Pulse 42 03/28/2018  7:45 AM  Resp 20 03/28/2018  7:45 AM  SpO2 90 % 03/28/2018  7:45 AM    Last Pain:  Vitals:   03/28/18 0651  TempSrc: Oral  PainSc: 8          Complications: No apparent anesthesia complications

## 2018-03-28 NOTE — Anesthesia Postprocedure Evaluation (Signed)
Anesthesia Post Note  Patient: Mandy Jones [facility]  Procedure(s) Performed: CARDIOVERSION (N/A )  Patient location during evaluation: Cath Lab Anesthesia Type: MAC Level of consciousness: awake and alert Pain management: pain level controlled Vital Signs Assessment: post-procedure vital signs reviewed and stable Respiratory status: spontaneous breathing, nonlabored ventilation, respiratory function stable and patient connected to nasal cannula oxygen Cardiovascular status: blood pressure returned to baseline and stable Postop Assessment: no apparent nausea or vomiting Anesthetic complications: no     Last Vitals:  Vitals:   03/28/18 0815 03/28/18 0830  BP: (!) 96/51 (!) 92/48  Pulse: (!) 43 (!) 41  Resp: 12 (!) 22  Temp:    SpO2: 94% 90%    Last Pain:  Vitals:   03/28/18 0830  TempSrc:   PainSc: 0-No pain                 Mandy Jones

## 2018-03-28 NOTE — Anesthesia Preprocedure Evaluation (Signed)
Anesthesia Evaluation  Patient identified by MRN, date of birth, ID band Patient awake    Reviewed: Allergy & Precautions, H&P , NPO status , Patient's Chart, lab work & pertinent test results, reviewed documented beta blocker date and time   History of Anesthesia Complications Negative for: history of anesthetic complications  Airway Mallampati: II  TM Distance: <3 FB Neck ROM: full    Dental  (+) Poor Dentition, Teeth Intact, Chipped   Pulmonary shortness of breath and with exertion, sleep apnea , former smoker,    Pulmonary exam normal        Cardiovascular Exercise Tolerance: Poor Normal cardiovascular exam+ dysrhythmias Atrial Fibrillation  Rhythm:regular Rate:Normal     Neuro/Psych PSYCHIATRIC DISORDERS Depression negative neurological ROS     GI/Hepatic negative GI ROS, GERD  ,(+) Hepatitis -  Endo/Other  Hypothyroidism   Renal/GU negative Renal ROS  negative genitourinary   Musculoskeletal  (+) Arthritis ,   Abdominal   Peds  Hematology negative hematology ROS (+)   Anesthesia Other Findings Past Medical History: No date: Arthritis No date: Depression No date: Dyspnea No date: Dysrhythmia No date: GERD (gastroesophageal reflux disease) No date: Hepatitis     Comment:  Hepatitis B 1985 No date: Mitral valve prolapse No date: Sleep apnea     Comment:  CPAP No date: Tendon tear, ankle     Comment:  rt foot No date: Thyroid dysfunction Past Surgical History: No date: CHOLECYSTECTOMY 08/03/2017: CONTINUOUS NERVE MONITORING; N/A     Comment:  Procedure: LARYNGEAL NERVE MONITORING;  Surgeon: Carloyn Manner, MD;  Location: [facility] ORS;  Service: ENT;                Laterality: N/A; No date: EYE SURGERY; Bilateral 2000: FOOT SURGERY; Left No date: JOINT REPLACEMENT     Comment:  right knee No date: SHOULDER ARTHROSCOPY; Bilateral No date: SHOULDER ARTHROSCOPY; Left 08/03/2017:  THYROIDECTOMY; N/A     Comment:  Procedure: THYROIDECTOMY;  Surgeon: Carloyn Manner,               MD;  Location: [facility] ORS;  Service: ENT;  Laterality: N/A; No date: TONSILLECTOMY No date: TUBAL LIGATION BMI    Body Mass Index:  39.96 kg/m     Reproductive/Obstetrics negative OB ROS                             Anesthesia Physical  Anesthesia Plan  ASA: IV  Anesthesia Plan: General   Post-op Pain Management:    Induction: Intravenous  PONV Risk Score and Plan: Propofol infusion and TIVA  Airway Management Planned: Natural Airway and Nasal Cannula  Additional Equipment:   Intra-op Plan:   Post-operative Plan:   Informed Consent: I have reviewed the patients History and Physical, chart, labs and discussed the procedure including the risks, benefits and alternatives for the proposed anesthesia with the patient or authorized representative who has indicated his/her understanding and acceptance.   Dental Advisory Given  Plan Discussed with: CRNA  Anesthesia Plan Comments: (Patient consented for risks of anesthesia including but not limited to:  - adverse reactions to medications - risk of intubation if required - damage to teeth, lips or other oral mucosa - sore throat or hoarseness - Damage to heart, brain, lungs or loss of life  Patient voiced understanding.)        Anesthesia Quick Evaluation

## 2018-03-28 NOTE — Anesthesia Post-op Follow-up Note (Signed)
Anesthesia QCDR form completed.        

## 2018-03-28 NOTE — CV Procedure (Signed)
Electrical Cardioversion Procedure Note Nely Dedmon [facility] [id number] 10-May-1951  Procedure: Electrical Cardioversion Indications:  Atrial Fibrillation  Procedure Details Consent: Risks of procedure as well as the alternatives and risks of each were explained to the (patient/caregiver).  Consent for procedure obtained. Time Out: Verified patient identification, verified procedure, site/side was marked, verified correct patient position, special equipment/implants available, medications/allergies/relevent history reviewed, required imaging and test results available.  Performed  Patient placed on cardiac monitor, pulse oximetry, supplemental oxygen as necessary.  Sedation given: Benzodiazepines and Short-acting barbiturates Pacer pads placed anterior and posterior chest.  Cardioverted 1 time(s).  Cardioverted at 120J.  Evaluation Findings: Post procedure EKG shows: NSR Complications: None Patient did tolerate procedure well.   Corey Skains 03/28/2018, 7:43 AM

## 2018-03-29 ENCOUNTER — Encounter
Admission: RE | Admit: 2018-03-29 | Discharge: 2018-03-29 | Disposition: A | Discharge status: Home / Self Care | Payer: Medicare HMO | Source: Ambulatory Visit | Attending: Podiatry | Admitting: Podiatry

## 2018-03-29 ENCOUNTER — Other Ambulatory Visit: Payer: Self-pay | Admitting: Podiatry

## 2018-03-29 ENCOUNTER — Other Ambulatory Visit: Payer: Self-pay

## 2018-03-29 DIAGNOSIS — Z01812 Encounter for preprocedural laboratory examination: Secondary | ICD-10-CM | POA: Diagnosis present

## 2018-03-29 DIAGNOSIS — I4891 Unspecified atrial fibrillation: Secondary | ICD-10-CM | POA: Insufficient documentation

## 2018-03-29 DIAGNOSIS — G4733 Obstructive sleep apnea (adult) (pediatric): Secondary | ICD-10-CM | POA: Insufficient documentation

## 2018-03-29 HISTORY — DX: Fibromyalgia: M79.7

## 2018-03-29 LAB — BASIC METABOLIC PANEL
ANION GAP: 5 (ref 5–15)
BUN: 18 mg/dL (ref 8–23)
CALCIUM: 9.4 mg/dL (ref 8.9–10.3)
CHLORIDE: 107 mmol/L (ref 98–111)
CO2: 31 mmol/L (ref 22–32)
Creatinine, Ser: 0.78 mg/dL (ref 0.44–1.00)
GFR calc non Af Amer: >60 mL/min (ref >60)
Glucose, Bld: 86 mg/dL (ref 70–99)
Potassium: 4.5 mmol/L (ref 3.5–5.1)
SODIUM: 143 mmol/L (ref 135–145)

## 2018-03-29 LAB — CBC
HCT: 39.4 % (ref 35.0–47.0)
Hemoglobin: 13.1 g/dL (ref 12.0–16.0)
MCH: 29.1 pg (ref 26.0–34.0)
MCHC: 33.3 g/dL (ref 32.0–36.0)
MCV: 87.4 fL (ref 80.0–100.0)
Platelets: 126 10*3/uL — ABNORMAL LOW (ref 150–440)
RBC: 4.51 MIL/uL (ref 3.80–5.20)
RDW: 14.8 % — ABNORMAL HIGH (ref 11.5–14.5)
WBC: 5.5 10*3/uL (ref 3.6–11.0)

## 2018-03-29 NOTE — Pre-Procedure Instructions (Signed)
Request for pre-op cardiac clearance including need for recommended stop date of apixaban and request to contact patient directly with stop date of apixaban faxed to Dr. Alveria Apley office, [place] faxed to Dr. Alvera Singh office. Pt verbalized understanding to follow up with Dr. Alveria Apley office as needed.

## 2018-03-29 NOTE — Patient Instructions (Signed)
  Your procedure is scheduled on: Friday April 06, 2018 Report to Same Day Surgery 2nd floor medical mall ([place] Entrance-take elevator on left to 2nd floor.  Check in with surgery information desk.) To find out your arrival time please call [phone] between 1PM - 3PM on Thursday April 05, 2018  Remember: Instructions that are not followed completely may result in serious medical risk, up to and including death, or upon the discretion of your surgeon and anesthesiologist your surgery may need to be rescheduled.    _x___ 1. Do not eat food (including mints, candies, chewing gum) after midnight the night before your procedure. You may drink clear liquids up to 2 hours before you are scheduled to arrive at the hospital for your procedure.  Do not drink clear liquids within 2 hours of your scheduled arrival to the hospital.  Clear liquids include  --Water or Apple juice without pulp  --Clear carbohydrate beverage such as Gatorade  --Black Coffee or Clear Tea (No milk, no creamers, do not add anything to the coffee or tea)    __x__ 2. No Alcohol for 24 hours before or after surgery.   __x__ 3. No Smoking or e-cigarettes for 24 prior to surgery.  Do not use any chewable tobacco products for at least 6 hour prior to surgery   __x__ 4. Notify your doctor if there is any change in your medical condition (cold, fever, infections).   __x__ 5. On the morning of surgery brush your teeth with toothpaste and water.  You may rinse your mouth with mouth wash if you wish.  Do not swallow any toothpaste or mouthwash.  Please read over the following fact sheets that you were given:   [facility] Preparing for Surgery and or MRSA Information    __x__ Use CHG Soap or sage wipes as directed on instruction sheet    Do not wear jewelry, make-up, hairpins, clips or nail polish.  Do not wear lotions, powders, deodorant, or perfumes.   Do not shave below the face/neck 48 hours prior to surgery.   Do  not bring valuables to the hospital.    [facility] is not responsible for any belongings or valuables.               Contacts, dentures or bridgework may not be worn into surgery.   For patients discharged on the day of surgery, you will NOT be permitted to drive yourself home.   _x___ Take anti-hypertensive listed below, cardiac, seizure, asthma, anti-reflux and psychiatric medicines. These include:  1. Amiodarone/Pacerone  2. Atorvastatin/Lipitor  3. Bupropion/Wellbutrin  4. Levothyroxine/Synthroid  5. Metoprolol/Lopressor  6. Ranitidine/Zantac  No Furosemide/Lasix or Calcium/Vitamin D on morning of surgery.  _x___ Bring C-Pap to the hospital.   _x___ Follow recommendations from Cardiologist, Pulmonologist or PCP regarding stopping Aspirin, Coumadin, Plavix ,Eliquis, Effient, or Pradaxa, and Pletal.  _x___ Stop Anti-inflammatories such as Advil, Aleve, Ibuprofen, Motrin, Naproxen, Naprosyn, Goodies powders or aspirin products. OK to take Tylenol and Celebrex.   _x___ NOW: Stop supplements (Krill Oil) until after surgery. But may continue Vitamin D, Vitamin B, and multivitamin.

## 2018-04-05 MED ORDER — CEFAZOLIN SODIUM-DEXTROSE 2-4 GM/100ML-% IV SOLN
2.0000 g | INTRAVENOUS | Status: AC
  Administered 2018-04-06: 2 g via INTRAVENOUS

## 2018-04-06 ENCOUNTER — Encounter
Admission: AD | Disposition: A | Discharge status: Skilled Nursing Facility | Payer: Self-pay | Source: Ambulatory Visit | Attending: Internal Medicine

## 2018-04-06 ENCOUNTER — Ambulatory Visit: Payer: Medicare HMO | Admitting: Certified Registered"

## 2018-04-06 ENCOUNTER — Encounter: Payer: Self-pay | Admitting: *Deleted

## 2018-04-06 ENCOUNTER — Inpatient Hospital Stay
Admission: AD | Admit: 2018-04-06 | Discharge: 2018-04-10 | DRG: 502 | Disposition: A | Discharge status: Skilled Nursing Facility | Payer: Medicare HMO | Source: Ambulatory Visit | Attending: Internal Medicine | Admitting: Internal Medicine

## 2018-04-06 ENCOUNTER — Other Ambulatory Visit: Payer: Self-pay

## 2018-04-06 DIAGNOSIS — G4733 Obstructive sleep apnea (adult) (pediatric): Secondary | ICD-10-CM

## 2018-04-06 DIAGNOSIS — M899 Disorder of bone, unspecified: Secondary | ICD-10-CM | POA: Diagnosis present

## 2018-04-06 DIAGNOSIS — M1990 Unspecified osteoarthritis, unspecified site: Secondary | ICD-10-CM | POA: Diagnosis present

## 2018-04-06 DIAGNOSIS — I480 Paroxysmal atrial fibrillation: Secondary | ICD-10-CM | POA: Diagnosis present

## 2018-04-06 DIAGNOSIS — M7731 Calcaneal spur, right foot: Secondary | ICD-10-CM | POA: Diagnosis present

## 2018-04-06 DIAGNOSIS — G4730 Sleep apnea, unspecified: Secondary | ICD-10-CM | POA: Diagnosis present

## 2018-04-06 DIAGNOSIS — I739 Peripheral vascular disease, unspecified: Secondary | ICD-10-CM | POA: Diagnosis present

## 2018-04-06 DIAGNOSIS — I341 Nonrheumatic mitral (valve) prolapse: Secondary | ICD-10-CM | POA: Diagnosis present

## 2018-04-06 DIAGNOSIS — F329 Major depressive disorder, single episode, unspecified: Secondary | ICD-10-CM | POA: Diagnosis present

## 2018-04-06 DIAGNOSIS — Z7901 Long term (current) use of anticoagulants: Secondary | ICD-10-CM

## 2018-04-06 DIAGNOSIS — I6523 Occlusion and stenosis of bilateral carotid arteries: Secondary | ICD-10-CM | POA: Diagnosis present

## 2018-04-06 DIAGNOSIS — E039 Hypothyroidism, unspecified: Secondary | ICD-10-CM | POA: Diagnosis present

## 2018-04-06 DIAGNOSIS — E785 Hyperlipidemia, unspecified: Secondary | ICD-10-CM | POA: Diagnosis present

## 2018-04-06 DIAGNOSIS — Z9989 Dependence on other enabling machines and devices: Secondary | ICD-10-CM

## 2018-04-06 DIAGNOSIS — I2510 Atherosclerotic heart disease of native coronary artery without angina pectoris: Secondary | ICD-10-CM | POA: Diagnosis present

## 2018-04-06 DIAGNOSIS — Z87891 Personal history of nicotine dependence: Secondary | ICD-10-CM

## 2018-04-06 DIAGNOSIS — M797 Fibromyalgia: Secondary | ICD-10-CM | POA: Diagnosis present

## 2018-04-06 DIAGNOSIS — Z79899 Other long term (current) drug therapy: Secondary | ICD-10-CM

## 2018-04-06 DIAGNOSIS — K219 Gastro-esophageal reflux disease without esophagitis: Secondary | ICD-10-CM | POA: Diagnosis present

## 2018-04-06 DIAGNOSIS — M7661 Achilles tendinitis, right leg: Principal | ICD-10-CM | POA: Diagnosis present

## 2018-04-06 DIAGNOSIS — Z8619 Personal history of other infectious and parasitic diseases: Secondary | ICD-10-CM

## 2018-04-06 DIAGNOSIS — M898X7 Other specified disorders of bone, ankle and foot: Secondary | ICD-10-CM | POA: Diagnosis present

## 2018-04-06 DIAGNOSIS — J449 Chronic obstructive pulmonary disease, unspecified: Secondary | ICD-10-CM | POA: Diagnosis present

## 2018-04-06 DIAGNOSIS — G8918 Other acute postprocedural pain: Secondary | ICD-10-CM | POA: Diagnosis not present

## 2018-04-06 DIAGNOSIS — Z96652 Presence of left artificial knee joint: Secondary | ICD-10-CM | POA: Diagnosis present

## 2018-04-06 HISTORY — PX: ACHILLES TENDON SURGERY: SHX542

## 2018-04-06 HISTORY — PX: OSTECTOMY: SHX6439

## 2018-04-06 LAB — URINE DRUG SCREEN, QUALITATIVE (ARMC ONLY)
Amphetamines, Ur Screen: NOT DETECTED
BARBITURATES, UR SCREEN: NOT DETECTED
CANNABINOID 50 NG, UR ~~LOC~~: NOT DETECTED
Cocaine Metabolite,Ur ~~LOC~~: NOT DETECTED
MDMA (Ecstasy)Ur Screen: NOT DETECTED
Methadone Scn, Ur: NOT DETECTED
OPIATE, UR SCREEN: NOT DETECTED
PHENCYCLIDINE (PCP) UR S: NOT DETECTED
TRICYCLIC, UR SCREEN: NOT DETECTED

## 2018-04-06 SURGERY — REPAIR, TENDON, ACHILLES
Anesthesia: General | Site: Foot | Laterality: Right | Wound class: Clean

## 2018-04-06 MED ORDER — MELATONIN 5 MG PO TABS
5.0000 mg | ORAL_TABLET | Freq: Every evening | ORAL | Status: DC | PRN
  Filled 2018-04-06: qty 1

## 2018-04-06 MED ORDER — APIXABAN 5 MG PO TABS
5.0000 mg | ORAL_TABLET | Freq: Two times a day (BID) | ORAL | Status: DC
  Administered 2018-04-06 – 2018-04-10 (×8): 5 mg via ORAL
  Filled 2018-04-06 (×8): qty 1

## 2018-04-06 MED ORDER — DEXAMETHASONE SODIUM PHOSPHATE 10 MG/ML IJ SOLN
INTRAMUSCULAR | Status: DC | PRN
  Administered 2018-04-06: 8 mg via INTRAVENOUS

## 2018-04-06 MED ORDER — LIDOCAINE HCL (CARDIAC) PF 100 MG/5ML IV SOSY
PREFILLED_SYRINGE | INTRAVENOUS | Status: DC | PRN
  Administered 2018-04-06: 100 mg via INTRAVENOUS

## 2018-04-06 MED ORDER — LIDOCAINE HCL (PF) 2 % IJ SOLN
INTRAMUSCULAR | Status: AC
  Filled 2018-04-06: qty 10

## 2018-04-06 MED ORDER — PROPOFOL 10 MG/ML IV BOLUS
INTRAVENOUS | Status: DC | PRN
  Administered 2018-04-06: 150 mg via INTRAVENOUS

## 2018-04-06 MED ORDER — ONDANSETRON HCL 4 MG/2ML IJ SOLN
INTRAMUSCULAR | Status: AC
  Filled 2018-04-06: qty 2

## 2018-04-06 MED ORDER — ATORVASTATIN CALCIUM 10 MG PO TABS
10.0000 mg | ORAL_TABLET | Freq: Every day | ORAL | Status: DC
  Administered 2018-04-07 – 2018-04-10 (×4): 10 mg via ORAL
  Filled 2018-04-06 (×4): qty 1

## 2018-04-06 MED ORDER — PHENYLEPHRINE HCL 10 MG/ML IJ SOLN
INTRAMUSCULAR | Status: DC | PRN
  Administered 2018-04-06 (×3): 50 ug via INTRAVENOUS

## 2018-04-06 MED ORDER — PROPOFOL 10 MG/ML IV BOLUS
INTRAVENOUS | Status: AC
  Filled 2018-04-06: qty 20

## 2018-04-06 MED ORDER — KRILL OIL 500 MG PO CAPS: 500.0000 mg | ORAL_CAPSULE | Freq: Every day | ORAL | Status: DC

## 2018-04-06 MED ORDER — TRAZODONE HCL 50 MG PO TABS
50.0000 mg | ORAL_TABLET | Freq: Every day | ORAL | Status: DC
  Administered 2018-04-06 – 2018-04-09 (×4): 50 mg via ORAL
  Filled 2018-04-06 (×4): qty 1

## 2018-04-06 MED ORDER — BUPIVACAINE LIPOSOME 1.3 % IJ SUSP
INTRAMUSCULAR | Status: AC
  Filled 2018-04-06: qty 20

## 2018-04-06 MED ORDER — CEFAZOLIN SODIUM-DEXTROSE 2-4 GM/100ML-% IV SOLN
INTRAVENOUS | Status: AC
  Filled 2018-04-06: qty 100

## 2018-04-06 MED ORDER — FENTANYL CITRATE (PF) 250 MCG/5ML IJ SOLN
INTRAMUSCULAR | Status: AC
  Filled 2018-04-06: qty 5

## 2018-04-06 MED ORDER — BUPROPION HCL ER (XL) 150 MG PO TB24
300.0000 mg | ORAL_TABLET | Freq: Every day | ORAL | Status: DC
  Administered 2018-04-07 – 2018-04-10 (×4): 300 mg via ORAL
  Filled 2018-04-06 (×4): qty 2

## 2018-04-06 MED ORDER — ONDANSETRON HCL 4 MG PO TABS: 4.0000 mg | ORAL_TABLET | Freq: Four times a day (QID) | ORAL | Status: DC | PRN

## 2018-04-06 MED ORDER — DEXAMETHASONE SODIUM PHOSPHATE 10 MG/ML IJ SOLN
INTRAMUSCULAR | Status: AC
  Filled 2018-04-06: qty 1

## 2018-04-06 MED ORDER — ONDANSETRON HCL 4 MG/2ML IJ SOLN
INTRAMUSCULAR | Status: DC | PRN
  Administered 2018-04-06: 4 mg via INTRAVENOUS

## 2018-04-06 MED ORDER — AZELASTINE HCL 0.1 % NA SOLN
1.0000 | Freq: Every day | NASAL | Status: DC | PRN
  Filled 2018-04-06: qty 30

## 2018-04-06 MED ORDER — ROCURONIUM BROMIDE 50 MG/5ML IV SOLN
INTRAVENOUS | Status: AC
  Filled 2018-04-06: qty 1

## 2018-04-06 MED ORDER — CYCLOSPORINE 0.05 % OP EMUL
1.0000 [drp] | Freq: Every day | OPHTHALMIC | Status: DC
  Filled 2018-04-06 (×5): qty 1

## 2018-04-06 MED ORDER — FAMOTIDINE 20 MG PO TABS
20.0000 mg | ORAL_TABLET | Freq: Every day | ORAL | Status: DC
  Administered 2018-04-06 – 2018-04-09 (×4): 20 mg via ORAL
  Filled 2018-04-06 (×4): qty 1

## 2018-04-06 MED ORDER — ONDANSETRON HCL 4 MG/2ML IJ SOLN: 4.0000 mg | Freq: Four times a day (QID) | INTRAMUSCULAR | Status: DC | PRN

## 2018-04-06 MED ORDER — FENTANYL CITRATE (PF) 100 MCG/2ML IJ SOLN
INTRAMUSCULAR | Status: DC | PRN
  Administered 2018-04-06: 150 ug via INTRAVENOUS

## 2018-04-06 MED ORDER — BUPIVACAINE HCL (PF) 0.25 % IJ SOLN
INTRAMUSCULAR | Status: AC
  Filled 2018-04-06: qty 30

## 2018-04-06 MED ORDER — POVIDONE-IODINE 7.5 % EX SOLN: Freq: Once | CUTANEOUS | Status: DC

## 2018-04-06 MED ORDER — SUCCINYLCHOLINE CHLORIDE 20 MG/ML IJ SOLN
INTRAMUSCULAR | Status: AC
  Filled 2018-04-06: qty 1

## 2018-04-06 MED ORDER — BUPIVACAINE-EPINEPHRINE (PF) 0.25% -1:200000 IJ SOLN
INTRAMUSCULAR | Status: AC
  Filled 2018-04-06: qty 30

## 2018-04-06 MED ORDER — ROCURONIUM BROMIDE 100 MG/10ML IV SOLN
INTRAVENOUS | Status: DC | PRN
  Administered 2018-04-06: 10 mg via INTRAVENOUS
  Administered 2018-04-06: 40 mg via INTRAVENOUS
  Administered 2018-04-06: 10 mg via INTRAVENOUS

## 2018-04-06 MED ORDER — BUPIVACAINE HCL (PF) 0.5 % IJ SOLN
INTRAMUSCULAR | Status: AC
  Filled 2018-04-06: qty 30

## 2018-04-06 MED ORDER — MIDAZOLAM HCL 2 MG/2ML IJ SOLN
INTRAMUSCULAR | Status: AC
  Filled 2018-04-06: qty 2

## 2018-04-06 MED ORDER — LIDOCAINE HCL (PF) 1 % IJ SOLN
INTRAMUSCULAR | Status: AC
  Filled 2018-04-06: qty 30

## 2018-04-06 MED ORDER — MORPHINE SULFATE (PF) 2 MG/ML IV SOLN
2.0000 mg | INTRAVENOUS | Status: DC | PRN
  Administered 2018-04-07 (×2): 2 mg via INTRAVENOUS
  Filled 2018-04-06 (×2): qty 1

## 2018-04-06 MED ORDER — METOPROLOL TARTRATE 25 MG PO TABS
25.0000 mg | ORAL_TABLET | Freq: Two times a day (BID) | ORAL | Status: DC
  Administered 2018-04-06 – 2018-04-09 (×7): 25 mg via ORAL
  Filled 2018-04-06 (×8): qty 1

## 2018-04-06 MED ORDER — LACTATED RINGERS IV SOLN
INTRAVENOUS | Status: DC
  Administered 2018-04-06: 07:00:00 via INTRAVENOUS

## 2018-04-06 MED ORDER — OXYCODONE-ACETAMINOPHEN 5-325 MG PO TABS
1.0000 | ORAL_TABLET | Freq: Four times a day (QID) | ORAL | Status: DC | PRN
  Administered 2018-04-07: 2 via ORAL
  Administered 2018-04-07 – 2018-04-08 (×4): 1 via ORAL
  Administered 2018-04-08: 2 via ORAL
  Administered 2018-04-09 – 2018-04-10 (×4): 1 via ORAL
  Filled 2018-04-06 (×3): qty 1
  Filled 2018-04-06 (×2): qty 2
  Filled 2018-04-06: qty 1
  Filled 2018-04-06: qty 2
  Filled 2018-04-06 (×3): qty 1
  Filled 2018-04-06: qty 2
  Filled 2018-04-06: qty 1

## 2018-04-06 MED ORDER — CALCIUM CARBONATE-VITAMIN D 500-200 MG-UNIT PO TABS
ORAL_TABLET | Freq: Every day | ORAL | Status: DC
  Filled 2018-04-06 (×2): qty 1

## 2018-04-06 MED ORDER — AMIODARONE HCL 200 MG PO TABS
400.0000 mg | ORAL_TABLET | Freq: Every day | ORAL | Status: DC
  Administered 2018-04-07 – 2018-04-09 (×3): 400 mg via ORAL
  Filled 2018-04-06 (×4): qty 2

## 2018-04-06 MED ORDER — OXYCODONE-ACETAMINOPHEN 5-325 MG PO TABS: 1.0000 | ORAL_TABLET | Freq: Four times a day (QID) | ORAL | 0 refills | Status: DC | PRN

## 2018-04-06 MED ORDER — SUGAMMADEX SODIUM 200 MG/2ML IV SOLN
INTRAVENOUS | Status: DC | PRN
  Administered 2018-04-06: 200 mg via INTRAVENOUS

## 2018-04-06 MED ORDER — MIDAZOLAM HCL 2 MG/2ML IJ SOLN
INTRAMUSCULAR | Status: DC | PRN
  Administered 2018-04-06: 2 mg via INTRAVENOUS

## 2018-04-06 MED ORDER — ONDANSETRON HCL 4 MG/2ML IJ SOLN: 4.0000 mg | Freq: Once | INTRAMUSCULAR | Status: DC | PRN

## 2018-04-06 MED ORDER — BUPIVACAINE-EPINEPHRINE 0.25% -1:200000 IJ SOLN
INTRAMUSCULAR | Status: DC | PRN
  Administered 2018-04-06: 10 mL

## 2018-04-06 MED ORDER — OCUVITE-LUTEIN PO CAPS
2.0000 | ORAL_CAPSULE | Freq: Every day | ORAL | Status: DC
  Administered 2018-04-10: 2 via ORAL
  Filled 2018-04-06 (×4): qty 2

## 2018-04-06 MED ORDER — FENTANYL CITRATE (PF) 100 MCG/2ML IJ SOLN: 25.0000 ug | INTRAMUSCULAR | Status: DC | PRN

## 2018-04-06 MED ORDER — FUROSEMIDE 20 MG PO TABS
20.0000 mg | ORAL_TABLET | Freq: Every day | ORAL | Status: DC
  Administered 2018-04-09: 20 mg via ORAL
  Filled 2018-04-06 (×4): qty 1

## 2018-04-06 MED ORDER — SUGAMMADEX SODIUM 200 MG/2ML IV SOLN
INTRAVENOUS | Status: AC
  Filled 2018-04-06: qty 2

## 2018-04-06 MED ORDER — LEVOTHYROXINE SODIUM 100 MCG PO TABS
200.0000 ug | ORAL_TABLET | Freq: Every day | ORAL | Status: DC
  Administered 2018-04-07 – 2018-04-10 (×4): 200 ug via ORAL
  Filled 2018-04-06 (×4): qty 2

## 2018-04-06 SURGICAL SUPPLY — 54 items
ANCHOR 4.5 FOOTPRINT ULTRA (Anchor) ×2 IMPLANT
BANDAGE ELASTIC 4 LF NS (GAUZE/BANDAGES/DRESSINGS) ×4 IMPLANT
BLADE SURG 15 STRL LF DISP TIS (BLADE) ×2 IMPLANT
BLADE SURG 15 STRL SS (BLADE) ×2
BLADE SURG MINI STRL (BLADE) ×2 IMPLANT
BNDG CONFORM 3 STRL LF (GAUZE/BANDAGES/DRESSINGS) ×2 IMPLANT
BNDG ESMARK 4X12 TAN STRL LF (GAUZE/BANDAGES/DRESSINGS) IMPLANT
BNDG ESMARK 6X12 TAN STRL LF (GAUZE/BANDAGES/DRESSINGS) ×2 IMPLANT
CANISTER SUCT 1200ML W/VALVE (MISCELLANEOUS) ×2 IMPLANT
DRAPE FLUOR MINI C-ARM 54X84 (DRAPES) ×2 IMPLANT
DURAPREP 26ML APPLICATOR (WOUND CARE) ×2 IMPLANT
ELECT REM PT RETURN 9FT ADLT (ELECTROSURGICAL) ×2
ELECTRODE REM PT RTRN 9FT ADLT (ELECTROSURGICAL) ×1 IMPLANT
GAUZE PETRO XEROFOAM 1X8 (MISCELLANEOUS) ×2 IMPLANT
GAUZE SPONGE 4X4 12PLY STRL (GAUZE/BANDAGES/DRESSINGS) ×2 IMPLANT
GAUZE STRETCH 2X75IN STRL (MISCELLANEOUS) ×2 IMPLANT
GLOVE BIO SURGEON STRL SZ7.5 (GLOVE) ×2 IMPLANT
GLOVE INDICATOR 8.0 STRL GRN (GLOVE) ×2 IMPLANT
GOWN STRL REUS W/ TWL LRG LVL3 (GOWN DISPOSABLE) ×2 IMPLANT
GOWN STRL REUS W/TWL LRG LVL3 (GOWN DISPOSABLE) ×2
HANDLE YANKAUER SUCT BULB TIP (MISCELLANEOUS) ×2 IMPLANT
KIT TURNOVER KIT A (KITS) ×2 IMPLANT
LABEL OR SOLS (LABEL) ×2 IMPLANT
NDL MAYO CATGUT SZ5 (NEEDLE) ×1
NDL SUT 5 .5 CRC TPR PNT MAYO (NEEDLE) ×1 IMPLANT
NEEDLE FILTER BLUNT 18X 1/2SAF (NEEDLE) ×1
NEEDLE FILTER BLUNT 18X1 1/2 (NEEDLE) ×1 IMPLANT
NEEDLE HYPO 25X1 1.5 SAFETY (NEEDLE) ×6 IMPLANT
NS IRRIG 500ML POUR BTL (IV SOLUTION) ×2 IMPLANT
PACK EXTREMITY ARMC (MISCELLANEOUS) ×2 IMPLANT
PAD CAST CTTN 4X4 STRL (SOFTGOODS) ×1 IMPLANT
PADDING CAST COTTON 4X4 STRL (SOFTGOODS) ×1
RASP SM TEAR CROSS CUT (RASP) ×2 IMPLANT
SPLINT CAST 1 STEP 5X30 WHT (MISCELLANEOUS) ×2 IMPLANT
SPLINT FAST PLASTER 5X30 (CAST SUPPLIES) ×1
SPLINT PLASTER CAST FAST 5X30 (CAST SUPPLIES) ×1 IMPLANT
SPONGE LAP 18X18 RF (DISPOSABLE) ×2 IMPLANT
STOCKINETTE M/LG 89821 (MISCELLANEOUS) ×2 IMPLANT
STRIP CLOSURE SKIN 1/2X4 (GAUZE/BANDAGES/DRESSINGS) ×2 IMPLANT
SUT MNCRL+ 5-0 VIOLET P-3 (SUTURE) ×1 IMPLANT
SUT MON AB 5-0 P3 18 (SUTURE) ×2 IMPLANT
SUT MONOCRYL 5-0 (SUTURE) ×1
SUT PDS AB 0 CT1 27 (SUTURE) IMPLANT
SUT VIC AB 0 SH 27 (SUTURE) ×2 IMPLANT
SUT VIC AB 2-0 SH 27 (SUTURE) ×3
SUT VIC AB 2-0 SH 27XBRD (SUTURE) ×3 IMPLANT
SUT VIC AB 3-0 SH 27 (SUTURE) ×2
SUT VIC AB 3-0 SH 27X BRD (SUTURE) ×2 IMPLANT
SUT VIC AB 4-0 FS2 27 (SUTURE) ×2 IMPLANT
SUT VICRYL AB 3-0 FS1 BRD 27IN (SUTURE) ×2 IMPLANT
SWABSTK COMLB BENZOIN TINCTURE (MISCELLANEOUS) ×2 IMPLANT
SYR 10ML LL (SYRINGE) ×4 IMPLANT
SYR 3ML LL SCALE MARK (SYRINGE) ×2 IMPLANT
WIRE MAGNUM (SUTURE) ×4 IMPLANT

## 2018-04-06 NOTE — H&P (Signed)
Mandy Jones is an 67 y.o. female.   Chief Complaint:  <principal problem not specified>   HPI:  Pt underwent right achilles tendon repair and calcaneal exostectomy.  PT asked to see and assist with crutch training for NWB status.  Upon evaluation found not to be able to maintain NWB status and felt pt is not safe to attempt to return home.  Will plan to admit pt for IV pain management and further PT evaluation and treatment.  May need SNF/Rehab placment if not able to safely return to home.    Past Medical History:  Diagnosis Date  . Arthritis   . Depression   . Dyspnea   . Dysrhythmia   . Fibromyalgia 1992  . GERD (gastroesophageal reflux disease)   . Hepatitis    Hepatitis B 1985  . Mitral valve prolapse   . Sleep apnea    CPAP  . Tendon tear, ankle    rt foot  . Thyroid dysfunction     Past Surgical History:  Procedure Laterality Date  . CARDIOVERSION N/A 02/06/2018   Procedure: CARDIOVERSION;  Surgeon: Corey Skains, MD;  Location: [facility] ORS;  Service: Cardiovascular;  Laterality: N/A;  . CARDIOVERSION N/A 03/28/2018   Procedure: CARDIOVERSION;  Surgeon: Corey Skains, MD;  Location: [facility] ORS;  Service: Cardiovascular;  Laterality: N/A;  . CHOLECYSTECTOMY    . CONTINUOUS NERVE MONITORING N/A 08/03/2017   Procedure: LARYNGEAL NERVE MONITORING;  Surgeon: Carloyn Manner, MD;  Location: [facility] ORS;  Service: ENT;  Laterality: N/A;  . EYE SURGERY Bilateral   . FOOT SURGERY Left 2000  . JOINT REPLACEMENT     left knee  . SHOULDER ARTHROSCOPY Bilateral   . SHOULDER ARTHROSCOPY Left   . THYROIDECTOMY N/A 08/03/2017   Procedure: THYROIDECTOMY;  Surgeon: Carloyn Manner, MD;  Location: [facility] ORS;  Service: ENT;  Laterality: N/A;  . TONSILLECTOMY    . TUBAL LIGATION      History reviewed. No pertinent family history. Social History:  reports that she has quit smoking. She has never used smokeless tobacco. She reports that she drinks alcohol. She reports that she  has current or past drug history. Drug: Marijuana.  Allergies: No Known Allergies  ROS  Medications Prior to Admission  Medication Sig Dispense Refill  . amiodarone (PACERONE) 400 MG tablet Take 400 mg by mouth daily.    Marland Kitchen apixaban (ELIQUIS) 5 MG TABS tablet Take 1 tablet (5 mg total) by mouth 2 (two) times daily. 60 tablet 0  . atorvastatin (LIPITOR) 10 MG tablet Take 10 mg by mouth daily.  0  . azelastine (ASTELIN) 0.1 % nasal spray Place 1 spray into both nostrils daily as needed for rhinitis. Use in each nostril as directed    . buPROPion (WELLBUTRIN XL) 300 MG 24 hr tablet Take 300 mg by mouth daily.   3  . Calcium Carbonate-Vitamin D (CALCIUM 600+D PO) Take 1 tablet by mouth daily.    . cycloSPORINE (RESTASIS) 0.05 % ophthalmic emulsion Place 1 drop into both eyes daily.     . furosemide (LASIX) 20 MG tablet Take 20 mg by mouth daily.  5  . Krill Oil 500 MG CAPS Take 500 mg by mouth daily.    Marland Kitchen levothyroxine (SYNTHROID, LEVOTHROID) 200 MCG tablet Take 1 tablet (200 mcg total) by mouth daily before breakfast. 30 tablet 0  . Melatonin 5 MG TABS Take 5 mg by mouth at bedtime as needed (sleep).     . metoprolol tartrate (LOPRESSOR)  25 MG tablet Take 1 tablet (25 mg total) by mouth 2 (two) times daily. 60 tablet 0  . multivitamin-lutein (OCUVITE-LUTEIN) CAPS capsule Take 2 capsules by mouth daily.     . ranitidine (ZANTAC) 150 MG tablet Take 300 mg by mouth at bedtime.     . traZODone (DESYREL) 50 MG tablet Take 50-150 mg by mouth at bedtime.   1    Physical Exam: General: Alert and oriented.  No apparent distress. Vascular:  Left foot:Dorsalis Pedis:  present Posterior Tibial:  present  Right foot: Dorsalis Pedis:  present Posterior Tibial:  present Neuro:intact sensation Derm:Skin with incision on posterior aspect of right heel. Ortho/MS: s/p right achilles tendon repair   Results for orders placed or performed during the hospital encounter of 04/06/18 (from the past 23  hour(s))  Urine Drug Screen, Qualitative ([place] only)     Status: Abnormal   Collection Time: 04/06/18  6:13 AM  Result Value Ref Range   Tricyclic, Ur Screen NONE DETECTED NONE DETECTED   Amphetamines, Ur Screen NONE DETECTED NONE DETECTED   MDMA (Ecstasy)Ur Screen NONE DETECTED NONE DETECTED   Cocaine Metabolite,Ur [place] NONE DETECTED NONE DETECTED   Opiate, Ur Screen NONE DETECTED NONE DETECTED   Phencyclidine (PCP) Ur S NONE DETECTED NONE DETECTED   Cannabinoid 50 Ng, Ur [place] NONE DETECTED NONE DETECTED   Barbiturates, Ur Screen NONE DETECTED NONE DETECTED   Benzodiazepine, Ur Scrn TEST NOT PERFORMED, REAGENT NOT AVAILABLE (A) NONE DETECTED   Methadone Scn, Ur NONE DETECTED NONE DETECTED    Comment: (NOTE) Tricyclics + metabolites, urine    Cutoff 1000 ng/mL Amphetamines + metabolites, urine  Cutoff 1000 ng/mL MDMA (Ecstasy), urine              Cutoff 500 ng/mL Cocaine Metabolite, urine          Cutoff 300 ng/mL Opiate + metabolites, urine        Cutoff 300 ng/mL Phencyclidine (PCP), urine         Cutoff 25 ng/mL Cannabinoid, urine                 Cutoff 50 ng/mL Barbiturates + metabolites, urine  Cutoff 200 ng/mL Benzodiazepine, urine              Cutoff 200 ng/mL Methadone, urine                   Cutoff 300 ng/mL The urine drug screen provides only a preliminary, unconfirmed analytical test result and should not be used for non-medical purposes. Clinical consideration and professional judgment should be applied to any positive drug screen result due to possible interfering substances. A more specific alternate chemical method must be used in order to obtain a confirmed analytical result. Gas chromatography / mass spectrometry (GC/MS) is the preferred confirmat ory method. Performed at [facility], [address] [place], [place] [address]    No results found.  Blood pressure 99/69, pulse 76, temperature (!) 96.9 F (36.1 C), temperature source Temporal, resp.  rate 16, height 5\' 2"  (1.575 m), weight 100.3 kg, SpO2 96 %.  Assessment/Plan Post op pain sp achilles tendon repair and calcaneal exostectomy.  Pt will need further PT to assist with safely able to return to home.  May need SNF placement.  Will have care management and social worker assist with needs.  Will have pt start IV morphine once local block wears off.  Have discussed with care mangement and PT already.  Will  continue with home meds including eliquis at this time.  Pt recently underwent cardioversion for afib and cleared for surgery by cardiology.  [name] A 04/06/2018, 12:47 PM

## 2018-04-06 NOTE — Anesthesia Procedure Notes (Signed)
Procedure Name: Intubation Performed by: Guinevere Stephenson, CRNA Pre-anesthesia Checklist: Patient identified, Patient being monitored, Timeout performed, Emergency Drugs available and Suction available Patient Re-evaluated:Patient Re-evaluated prior to induction Oxygen Delivery Method: Circle system utilized Preoxygenation: Pre-oxygenation with 100% oxygen Induction Type: IV induction Ventilation: Mask ventilation without difficulty Laryngoscope Size: 3 and McGraph Grade View: Grade I Tube type: Oral Tube size: 7.0 mm Number of attempts: 1 Airway Equipment and Method: Stylet Placement Confirmation: ETT inserted through vocal cords under direct vision,  positive ETCO2 and breath sounds checked- equal and bilateral Secured at: 21 cm Tube secured with: Tape Dental Injury: Teeth and Oropharynx as per pre-operative assessment        

## 2018-04-06 NOTE — Plan of Care (Signed)

## 2018-04-06 NOTE — Discharge Instructions (Addendum)
[facility] [address] [place]   1. Take your medication as prescribed.  Pain medication should be taken only as needed.  2. Keep the dressing clean, dry and intact.  3. Keep your foot elevated above the heart level for the first 48 hours.  4. We have instructed you to be non-weight bearing.  5. Always use your crutches if you are to be non-weight bearing.  6. Do not take a shower. Baths are permissible as long as the foot is kept out of the water.   7. Every hour you are awake:  - Bend your knee 15 times.  8. Call [facility] [phone]) if any of the following problems occur: - You develop a temperature or fever. - The bandage becomes saturated with blood. - Medication does not stop your pain. - Injury of the foot occurs. - Any symptoms of infection including redness, odor, or red streaks running from wound.    AMBULATORY SURGERY  DISCHARGE INSTRUCTIONS   1) The drugs that you were given will stay in your system until tomorrow so for the next 24 hours you should not:  A) Drive an automobile B) Make any legal decisions C) Drink any alcoholic beverage   2) You may resume regular meals tomorrow.  Today it is better to start with liquids and gradually work up to solid foods.  You may eat anything you prefer, but it is better to start with liquids, then soup and crackers, and gradually work up to solid foods.   3) Please notify your doctor immediately if you have any unusual bleeding, trouble breathing, redness and pain at the surgery site, drainage, fever, or pain not relieved by medication.    4) Additional Instructions:        Please contact your physician with any problems or Same Day Surgery at [phone], Monday through Friday 6 am to 4 pm, or [place] at [facility] number at [phone].

## 2018-04-06 NOTE — Progress Notes (Signed)
Pt placed on Auto CPAP machine. Machine plugged into red outlet pt tolerated nasal mask well. Pt oriented to our machine, understands how to remove mask if necessary. Will continue to monitor

## 2018-04-06 NOTE — Anesthesia Preprocedure Evaluation (Signed)
Anesthesia Evaluation  Patient identified by MRN, date of birth, ID band Patient awake    Reviewed: Allergy & Precautions, H&P , NPO status , Patient's Chart, lab work & pertinent test results, reviewed documented beta blocker date and time   Airway Mallampati: III  TM Distance: >3 FB Neck ROM: full    Dental  (+) Teeth Intact, Poor Dentition   Pulmonary neg pulmonary ROS, shortness of breath and with exertion, sleep apnea and Continuous Positive Airway Pressure Ventilation , former smoker,    Pulmonary exam normal        Cardiovascular Exercise Tolerance: Poor + CAD and + Peripheral Vascular Disease  (-) Orthopnea and (-) PND negative cardio ROS Normal cardiovascular exam+ dysrhythmias  Rhythm:regular Rate:Normal     Neuro/Psych PSYCHIATRIC DISORDERS Bilateral carotid artery stenosis negative neurological ROS  negative psych ROS   GI/Hepatic negative GI ROS, Neg liver ROS, GERD  Medicated,(+) Hepatitis -, B  Endo/Other  negative endocrine ROS  Renal/GU negative Renal ROS  negative genitourinary   Musculoskeletal   Abdominal   Peds  Hematology negative hematology ROS (+)   Anesthesia Other Findings Past Medical History: No date: Arthritis No date: Depression No date: Dyspnea No date: Dysrhythmia No date: GERD (gastroesophageal reflux disease) No date: Hepatitis     Comment:  Hepatitis B 1985 No date: Mitral valve prolapse No date: Sleep apnea     Comment:  CPAP No date: Thyroid dysfunction Past Surgical History: No date: CHOLECYSTECTOMY No date: EYE SURGERY; Bilateral 2000: FOOT SURGERY; Left No date: JOINT REPLACEMENT     Comment:  right knee No date: SHOULDER ARTHROSCOPY; Bilateral No date: SHOULDER ARTHROSCOPY; Left No date: TONSILLECTOMY No date: TUBAL LIGATION BMI    Body Mass Index:  40.97 kg/m     Reproductive/Obstetrics negative OB ROS                              Anesthesia Physical  Anesthesia Plan  ASA: III  Anesthesia Plan: General ETT   Post-op Pain Management:    Induction: Intravenous  PONV Risk Score and Plan: 4 or greater  Airway Management Planned: Video Laryngoscope Planned  Additional Equipment:   Intra-op Plan:   Post-operative Plan:   Informed Consent: I have reviewed the patients History and Physical, chart, labs and discussed the procedure including the risks, benefits and alternatives for the proposed anesthesia with the patient or authorized representative who has indicated his/her understanding and acceptance.   Dental Advisory Given  Plan Discussed with: CRNA  Anesthesia Plan Comments:         Anesthesia Quick Evaluation

## 2018-04-06 NOTE — Clinical Social Work Note (Signed)
Clinical Social Work Assessment  Patient Details  Name: Mandy Jones MRN: [id number] Date of Birth: [birth year]  Date of referral:  04/06/18               Reason for consult:  Facility Placement                Permission sought to share information with:  Chartered certified accountant granted to share information::  Yes, Verbal Permission Granted  Name::      [place]::   [place]   Relationship::     Contact Information:     Housing/Transportation Living arrangements for the past 2 months:  [place] of Information:  Patient, Friend/Neighbor Patient Interpreter Needed:  None Criminal Activity/Legal Involvement Pertinent to Current Situation/Hospitalization:  No - Comment as needed Significant Relationships:  Neighbor Lives with:  Self Do you feel safe going back to the place where you live?  Yes Need for family participation in patient care:  Yes (Comment)  Care giving concerns:  Patient lives alone in [place].    Social Worker assessment / plan:  Holiday representative (CSW) received a call from [name] stating that he is going to admit the patient under observation to the hospital for post op pain control. Per [name] PT is recommending SNF. CSW explained that [facility] requires authorization and can take several days to make a decision. CSW also explained that patient can D/C home with home health and home health social worker to work on rehab placement from home. Per lead RN case manager she met with patient and patient prefers to go to [facility] for short term rehab. FL2 complete and faxed out. Per Tammy admissions coordinator at [facility] she can make a bed offer for patient and will start [facility] authorization today.   CSW met with patient and her neighbor [name] was at bedside. Patient was alert and oriented X4 and was sitting up in the chair at bedside. Per patient she lives alone in [place] and her neighbor  provides support and transportation. Per patient she has pets at home that her neighbor is caring for. CSW explained that [facility] authorization takes several days. CSW also made her aware that [facility] has started [facility] authorization. CSW also explained home health option. Per patient she prefers to go to [facility] but is okay with going home if needed. Patient reported that her biggest concern is getting from her driveway to her garage because it is gravel. CSW explained that if needed EMS can provide transportation home however she would have a co-pay. Patient verbalized her understanding and thanked CSW for assistance. CSW will continue to follow and assist as needed.   Employment status:  Disabled (Comment on whether or not currently receiving Disability) Insurance information:  Managed Medicare PT Recommendations:  [place] / Referral to community resources:  [place]  Patient/Family's Response to care:  Patient accepted bed offer from [facility].   Patient/Family's Understanding of and Emotional Response to Diagnosis, Current Treatment, and Prognosis:  Patient was very pleasant and thanked CSW for assistance.   Emotional Assessment Appearance:  Appears stated age Attitude/Demeanor/Rapport:    Affect (typically observed):  Accepting, Adaptable, Pleasant Orientation:  Oriented to Self, Oriented to Place, Oriented to  Time, Oriented to Situation Alcohol / Substance use:  Not Applicable Psych involvement (Current and /or in the community):  No (Comment)  Discharge Needs  Concerns to be  addressed:  Discharge Planning Concerns Readmission within the last 30 days:  No Current discharge risk:  Dependent with Mobility Barriers to Discharge:  Continued Medical Work up   [employer], Veronia Beets, LCSW 04/06/2018, 2:51 PM

## 2018-04-06 NOTE — Care Management Note (Signed)
Case Management Note  Patient Details  Name: Mandy Jones MRN: [id number] Date of Birth: Jul 07, 1951  Subjective/Objective:                  RNCM called by Dr. Vickki Jones with concerns for patient's safety post op today for Achilles Tendinitis-Right. She is to be non-weight bearing however she was not able to maintain that with PT evaluation today and a standard walker. She has a bedside commode at home from previous orthopedic procedure with Dr. Claiborne Jones of [facility]. She lives alone and has 3-4 stairs to get into home. She shares concern with gravel driveway and walk way.  Her friend Mandy Jones brought her to the hospital and patient shares that she feels Mandy Jones can provide transportation to appointments.  Mandy Jones will take care of patient's dogs tonight and then take dogs to be boarded. Her PCP Dr. Andree Jones of [facility].  She lives in [place] and if she has to go to SNF she prefers [facility]. List of SNF agencies shared with patient ([facility], [facility], [facility], [facility], [place] [facility]). Patient prefers [facility] due to [employer]. She uses [facility] for medications without concerns.  Action/Plan: CSW notified of [facility] preference. Patient may need front-wheeled walker for home (standard walker is borrowed). Home health list provided to patient for review.MOON explained and answered any questions.   Expected Discharge Date:  03/28/18               Expected Discharge Plan:     In-House Referral:  Clinical Social Work  Discharge planning Services  CM Consult  Post Acute Care Choice:  Home Health, Durable Medical Equipment Choice offered to:  Patient  DME Arranged:  Walker rolling DME Agency:     HH Arranged:  PT, OT, Nurse's Aide, Social Work [employer] Agency:     Status of Service:  In process, will continue to follow  If discussed at Long Length of Stay Meetings, dates discussed:    Additional Comments:  Mandy Garfinkel, RN 04/06/2018, 12:48 PM

## 2018-04-06 NOTE — Anesthesia Post-op Follow-up Note (Signed)
Anesthesia QCDR form completed.        

## 2018-04-06 NOTE — NC FL2 (Signed)
  [place] LEVEL OF CARE SCREENING TOOL     IDENTIFICATION  Patient Name: Mandy Jones [facility] Birthdate: 1950-12-17 Sex: female Admission Date (Current Location): 04/06/2018  [place] and [place] Number:  Engineering geologist and Address:  [facility], [address] [place], [place] [address]      Provider Number: [id number]  Attending Physician Name and Address:  [name], DPM  Relative Name and Phone Number:       Current Level of Care: Hospital Recommended Level of Care: [place] Prior Approval Number:    Date Approved/Denied:   PASRR Number: [id number])  Discharge Plan: SNF    Current Diagnoses: Patient Active Problem List   Diagnosis Date Noted  . Post-operative pain 04/06/2018  . Atrial fibrillation with RVR ([place]) 12/14/2017  . Hypothyroidism 12/14/2017  . Valvular heart disease 12/14/2017  . Muscle cramps 12/14/2017  . S/P complete thyroidectomy 08/03/2017    Orientation RESPIRATION BLADDER Height & Weight     Self, Time, Situation, Place  Normal Continent Weight: 100.3 kg Height:  5\' 2"  (157.5 cm)  BEHAVIORAL SYMPTOMS/MOOD NEUROLOGICAL BOWEL NUTRITION STATUS      Continent Diet(Diet: Regular )  AMBULATORY STATUS COMMUNICATION OF NEEDS Skin   Extensive Assist Verbally Surgical wounds                       Personal Care Assistance Level of Assistance  Bathing, Feeding, Dressing Bathing Assistance: Limited assistance Feeding assistance: Independent Dressing Assistance: Limited assistance     Functional Limitations Info  Sight, Hearing, Speech Sight Info: Adequate Hearing Info: Adequate Speech Info: Adequate    SPECIAL CARE FACTORS FREQUENCY  PT (By licensed PT), OT (By licensed OT)     PT Frequency: (5) OT Frequency: (5)            Contractures      Additional Factors Info  Code Status, Allergies Code Status Info: (Full Code. ) Allergies Info: (No Known Allergies.  )           Current Medications (04/06/2018):  This is the current hospital active medication list Current Facility-Administered Medications  Medication Dose Route Frequency Provider Last Rate Last Dose  . fentaNYL (SUBLIMAZE) injection 25 mcg  25 mcg Intravenous Q5 min PRN Alvin Critchley, MD      . lactated ringers infusion   Intravenous Continuous Alvin Critchley, MD 75 mL/hr at 04/06/18 [phone]    . ondansetron (ZOFRAN) injection 4 mg  4 mg Intravenous Once PRN Alvin Critchley, MD      . ondansetron [facility]) tablet 4 mg  4 mg Oral Q6H PRN [name], DPM       Or  . ondansetron [facility]) injection 4 mg  4 mg Intravenous Q6H PRN [name], DPM      . povidone-iodine (BETADINE) 7.5 % scrub   Topical Once [name], DPM         Discharge Medications: Please see discharge summary for a list of discharge medications.  Relevant Imaging Results:  Relevant Lab Results:   Additional Information (SSN: [id number])  Guerin Lashomb, Veronia Beets, LCSW

## 2018-04-06 NOTE — Transfer of Care (Signed)
Immediate Anesthesia Transfer of Care Note  Patient: Jordynne Mccown [facility]  Procedure(s) Performed: ACHILLES TENDON REPAIR (Right Foot) OSTECTOMY-HAGLUNDS/RECTROCALCANEAL (Right Foot)  Patient Location: PACU  Anesthesia Type:General  Level of Consciousness: drowsy and responds to stimulation  Airway & Oxygen Therapy: Patient Spontanous Breathing and Patient connected to face mask oxygen  Post-op Assessment: Report given to RN and Post -op Vital signs reviewed and stable  Post vital signs: Reviewed and stable  Last Vitals:  Vitals Value Taken Time  BP 126/74 04/06/2018  9:28 AM  Temp    Pulse 93 04/06/2018  9:28 AM  Resp 17 04/06/2018  9:28 AM  SpO2 98 % 04/06/2018  9:28 AM    Last Pain:  Vitals:   04/06/18 8325  TempSrc: Tympanic  PainSc: 6          Complications: No apparent anesthesia complications

## 2018-04-06 NOTE — H&P (Signed)
HISTORY AND PHYSICAL INTERVAL NOTE:  04/06/2018  7:15 AM  Mandy Jones  has presented today for surgery, with the diagnosis of Achilles Tendinitis-Right Exostosis-Right.  The various methods of treatment have been discussed with the patient.  No guarantees were given.  After consideration of risks, benefits and other options for treatment, the patient has consented to surgery.  I have reviewed the patients' chart and labs.     A history and physical examination was performed in my office.  The patient was reexamined.  There have been no changes to this history and physical examination.  Mandy Jones A

## 2018-04-06 NOTE — Progress Notes (Signed)
PHARMACIST - PHYSICIAN ORDER COMMUNICATION  CONCERNING: P&T Medication Policy on Herbal Medications  DESCRIPTION:  This patient's order for:  Mandy Jones  has been noted.  This product(s) is classified as an "herbal" or natural product. Due to a lack of definitive safety studies or FDA approval, nonstandard manufacturing practices, plus the potential risk of unknown drug-drug interactions while on inpatient medications, the Pharmacy and Therapeutics Committee does not permit the use of "herbal" or natural products of this type within [facility].   ACTION TAKEN: The pharmacy department is unable to verify this order at this time and your patient has been informed of this safety policy. Please reevaluate patient's clinical condition at discharge and address if the herbal or natural product(s) should be resumed at that time.

## 2018-04-06 NOTE — OR Nursing (Signed)
Physical Therapy in earlier for assessment and discussed findings with MD.  Orders in by [name] for observation.  Levada Dy from case management in to see pt @ 1245.

## 2018-04-06 NOTE — OR Nursing (Signed)
Room 142 assigned, 15 min call given to floor at 1310

## 2018-04-06 NOTE — Clinical Social Work Placement (Signed)
   CLINICAL SOCIAL WORK PLACEMENT  NOTE  Date:  04/06/2018  Patient Details  Name: Mandy Jones MRN: [id number] Date of Birth: 1950-11-10  Clinical Social Work is seeking post-discharge placement for this patient at the [place] level of care (*CSW will initial, date and re-position this form in  chart as items are completed):  Yes   Patient/family provided with [facility] Work Department's list of facilities offering this level of care within the geographic area requested by the patient (or if unable, by the patient's family).  Yes   Patient/family informed of their freedom to choose among providers that offer the needed level of care, that participate in Medicare, Medicaid or managed care program needed by the patient, have an available bed and are willing to accept the patient.  Yes   Patient/family informed of [facility]'s ownership interest in [facility] and [facility], as well as of the fact that they are under no obligation to receive care at these facilities.  PASRR submitted to EDS on       PASRR number received on       Existing PASRR number confirmed on 04/06/18     FL2 transmitted to all facilities in geographic area requested by pt/family on 04/06/18     FL2 transmitted to all facilities within larger geographic area on       Patient informed that his/her managed care company has contracts with or will negotiate with certain facilities, including the following:        Yes   Patient/family informed of bed offers received.  [facility]     Physician recommends and patient chooses bed at      Patient to be transferred to   on  .  Patient to be transferred to facility by       Patient family notified on   of transfer.  Name of family member notified:        PHYSICIAN       Additional Comment:    _______________________________________________ Gunter Conde, Veronia Beets, LCSW 04/06/2018, 2:49 PM

## 2018-04-06 NOTE — Care Management Obs Status (Signed)
[place] NOTIFICATION   Patient Details  Name: Mandy Jones MRN: [id number] Date of Birth: 10/13/1950   Medicare Observation Status Notification Given:  Yes    [name], RN 04/06/2018, 12:48 PM

## 2018-04-06 NOTE — Progress Notes (Signed)
Physical Therapy Treatment Patient Details Name: Mandy Jones MRN: [id number] DOB: Jan 14, 1951 Today's Date: 04/06/2018    History of Present Illness Pt presents to hospital on 04/06/18 for an elective R achilles reconstruction after conservitive treatment of achillies tendinopathy has failed. Pt has a past medical history that includes GERD, PVD, hep B and sleep apnea    PT Comments    Pt seen this afternoon and states that she is doing well and is agreeable to work with PT. Pt states that her pain is 0/10 at onset of session and remains controlled throughout. Pt demonstrates better ability to maintain NWB on R side this session however periodically breaks them mostly during transfers. Pt CGA sit to stand pivot to transfer into WC. After much demonstration and verbal cuing pt able to self propel WC 60' with L LE and B UEs. Pt able to manage WC parts and maneuver it safely. Much pt education provided about when to use WC, positioning of WC, and safety during WC use, pt verbalized understanding. Pt could benefit from continued skilled therapy at this time to improve deficits toward PLOF. PT will continue to work with pt BID while admitted. D/c recommendations continue to be SNF.     Follow Up Recommendations  SNF     Equipment Recommendations  Wheelchair (measurements PT)    Recommendations for Other Services       Precautions / Restrictions Precautions Precautions: Fall Restrictions Weight Bearing Restrictions: Yes RLE Weight Bearing: Non weight bearing    Mobility  Bed Mobility               General bed mobility comments: Not assessed this visit, pt up in chair upon arrival remains upon completion  Transfers Overall transfer level: Needs assistance Equipment used: Standard walker Transfers: Sit to/from Stand;Stand Pivot Transfers Sit to Stand: Min guard Stand pivot transfers: Min guard       General transfer comment: Pt CGA with transferring sit<>stand. Pt  demonstrates improving ability to maintain weight bearing status however pt still places foot down intermittently throughout transfer  Ambulation/Gait             General Gait Details: Not assessed this visit due to pts inability to hop on L LE while maintaining R NWB status   Theme park manager mobility: Yes Wheelchair propulsion: Both upper extremities;Left lower extremity Wheelchair parts: Supervision/cueing Distance: 34 Wheelchair Assistance Details (indicate cue type and reason): Pt demonstrates ability to propell self with L LE, and B UEs going 48' with verbal cuing on how to propel and perform turns. pt educated on safety during both propelling, transfers to and from [facility] as well as WC parts.  Modified Rankin (Stroke Patients Only)       Balance Overall balance assessment: Needs assistance   Sitting balance-Leahy Scale: Good Sitting balance - Comments: able to sit without UE support and no LOB     Standing balance-Leahy Scale: Poor Standing balance comment: Pt requires B UE and is still unable to maintain weight bearing status                            Cognition Arousal/Alertness: Awake/alert Behavior During Therapy: WFL for tasks assessed/performed Overall Cognitive Status: Within Functional Limits for tasks assessed  Exercises Other Exercises Other Exercises: Pt instructed in R LE ther-ex including LAQ and seated marching x10-15 reps each    General Comments        Pertinent Vitals/Pain Pain Assessment: 0-10 Pain Score: 0-No pain Pain Location: R ankle    Home Living Family/patient expects to be discharged to:: Private residence Living Arrangements: Alone Available Help at Discharge: Friend(s)(friend present, makes it clear she is not able to help much) Type of Home: House Home Access: Stairs to enter Entrance Stairs-Rails:  Right;Left(can not reach both)   Home Equipment: Walker - standard;Bedside commode(knee scooter) Additional Comments: Pt has long gravel pathway leading to stairs to enter    Prior Function Level of Independence: Independent          PT Goals (current goals can now be found in the care plan section) Acute Rehab PT Goals Patient Stated Goal: to go home PT Goal Formulation: With patient Time For Goal Achievement: 04/20/18 Potential to Achieve Goals: Good Progress towards PT goals: Progressing toward goals    Frequency    BID      PT Plan Current plan remains appropriate    Co-evaluation              AM-PAC PT "6 Clicks" Daily Activity  Outcome Measure  Difficulty turning over in bed (including adjusting bedclothes, sheets and blankets)?: A Little Difficulty moving from lying on back to sitting on the side of the bed? : A Little Difficulty sitting down on and standing up from a chair with arms (e.g., wheelchair, bedside commode, etc,.)?: Unable Help needed moving to and from a bed to chair (including a wheelchair)?: A Little Help needed walking in hospital room?: Total Help needed climbing 3-5 steps with a railing? : Total 6 Click Score: 12    End of Session Equipment Utilized During Treatment: Gait belt Activity Tolerance: Patient tolerated treatment well Patient left: in chair;with call bell/phone within reach;with chair alarm set Nurse Communication: Mobility status PT Visit Diagnosis: Unsteadiness on feet (R26.81);Other abnormalities of gait and mobility (R26.89);Muscle weakness (generalized) (M62.81);Difficulty in walking, not elsewhere classified (R26.2);Pain Pain - Right/Left: Right Pain - part of body: Ankle and joints of foot     Time: 1447-1520 PT Time Calculation (min) (ACUTE ONLY): 33 min  Charges:  $Therapeutic Activity: 8-22 mins $Wheel Chair Management: 8-22 mins                     [employer], SPT    Jericca Russett 04/06/2018, 3:59  PM

## 2018-04-06 NOTE — Evaluation (Signed)
Physical Therapy Evaluation Patient Details Name: Mandy Jones MRN: [id number] DOB: [birth year] Today's Date: 04/06/2018   History of Present Illness  Pt presents to hospital on 04/06/18 for an elective R achilles reconstruction after conservitive treatment of achillies tendinopathy has failed. Pt has a past medical history that includes GERD, PVD, hep B and sleep apnea    Clinical Impression  Pt is a pleasant 67 year old female seen this afternoon in the PACU status post elective R achilles tendon repair after conservative management of achilles tendonitis failed. Pt lives alone and has very minimal support at home. Pt has 3 stairs to get inside and long gravel pathway to get to the house. Pt is non weight bearing for R LE. Pt seen this morning and is CGA to min assist to transfer sit<>stand and even still is unable to maintain NWB status. Pt unable to assist with donning clothing without breaking weight bearing status. Attempted amb with pt however pt is unable to clear L foot to hop with RW making amb unsafe at this time. Pt states that she does not have any family or friends that could assist her at home or that she could live with. Due to pts current presentation pt is unsafe to return home at this time and would be a high risk for fall. Pt could benefit from skilled therapy at this time due to return toward PLOF. PT spoke with [name] and [name] is in agreement with recommendations. PT will continue to work with pt BID if she is admitted. PT recommends d/c to a SNF. This entire session was guided and directed by Collie Siad, PT DPT.    Patient with above diagnosis which impairs his/her ability to perform daily activities like toileting, dressing, grooming, and bathing in the home. A walker will not resolve the patient's issue with performing activities of daily living. A wheelchair with R elevating leg rest is required/recommended and will allow patient to safely perform daily  activities.   PT will use future sessions to provide training in self propelling WC and/or education provided to helper/caregiver.      Follow Up Recommendations SNF    Equipment Recommendations  Wheelchair (measurements PT)    Recommendations for Other Services       Precautions / Restrictions Precautions Precautions: Fall Restrictions Weight Bearing Restrictions: Yes RLE Weight Bearing: Non weight bearing      Mobility  Bed Mobility               General bed mobility comments: Not assessed this visit, pt up in chair upon arrival remains upon completion  Transfers Overall transfer level: Needs assistance Equipment used: Standard walker Transfers: Sit to/from Stand Sit to Stand: Min guard;Min assist         General transfer comment: Pt CGA to min assist with transferring sit<>stand at standard walker yet still unable to maintian weight bearing status  Ambulation/Gait             General Gait Details: Pt unable to clear foot when hopping on L with standard walker which limits pts ability to amb. Pt able to advance foot a few inches when attempting hop.  Stairs            Wheelchair Mobility    Modified Rankin (Stroke Patients Only)       Balance Overall balance assessment: Needs assistance   Sitting balance-Leahy Scale: Good Sitting balance - Comments: able to sit without UE support and no LOB  Standing balance-Leahy Scale: Fair Standing balance comment: Pt requires B UE and is still unable to maintain weight bearing status                             Pertinent Vitals/Pain Pain Assessment: 0-10 Pain Score: 0-No pain Pain Location: R ankle    Home Living Family/patient expects to be discharged to:: Private residence Living Arrangements: Alone Available Help at Discharge: Friend(s)(friend present, makes it clear she is not able to help much) Type of Home: House Home Access: Stairs to enter Entrance Stairs-Rails:  Right;Left(can not reach both) Entrance Stairs-Number of Steps: 3   Home Equipment: Walker - standard;Bedside commode(knee scooter) Additional Comments: Pt has long gravel pathway leading to stairs to enter    Prior Function Level of Independence: Independent               Hand Dominance        Extremity/Trunk Assessment   Upper Extremity Assessment Upper Extremity Assessment: Overall WFL for tasks assessed(assessed through use of RW during gait)    Lower Extremity Assessment Lower Extremity Assessment: RLE deficits/detail;LLE deficits/detail RLE Deficits / Details: Not fully assessed due to restrictions, able to perform 10 LAQ, and 10 SLR with walking boot indicating functional strength RLE Sensation: decreased light touch(Still lacking sensation from anesthesia) LLE Deficits / Details: WFL strength, pt reports previous history of knee replacement with decreased ROM on L        Communication   Communication: No difficulties  Cognition Arousal/Alertness: Awake/alert Behavior During Therapy: WFL for tasks assessed/performed Overall Cognitive Status: Within Functional Limits for tasks assessed                                        General Comments      Exercises Other Exercises Other Exercises: Pt instructed in R LE ther-ex including SLR, LAQ, and hip abd x10 reps each.   Assessment/Plan    PT Assessment Patient needs continued PT services  PT Problem List Decreased strength;Decreased range of motion;Decreased activity tolerance;Decreased balance;Decreased mobility;Decreased coordination;Decreased knowledge of use of DME       PT Treatment Interventions DME instruction;Gait training;Stair training;Functional mobility training;Therapeutic activities;Therapeutic exercise;Balance training;Neuromuscular re-education;Patient/family education    PT Goals (Current goals can be found in the Care Plan section)  Acute Rehab PT Goals Patient Stated Goal:  to go home PT Goal Formulation: With patient Time For Goal Achievement: 04/20/18 Potential to Achieve Goals: Good    Frequency BID(If pt admitted pt will be BID)   Barriers to discharge        Co-evaluation               AM-PAC PT "6 Clicks" Daily Activity  Outcome Measure Difficulty turning over in bed (including adjusting bedclothes, sheets and blankets)?: A Little Difficulty moving from lying on back to sitting on the side of the bed? : A Little Difficulty sitting down on and standing up from a chair with arms (e.g., wheelchair, bedside commode, etc,.)?: Unable Help needed moving to and from a bed to chair (including a wheelchair)?: A Lot Help needed walking in hospital room?: Total Help needed climbing 3-5 steps with a railing? : Total 6 Click Score: 11    End of Session Equipment Utilized During Treatment: Gait belt Activity Tolerance: Patient tolerated treatment well Patient left: in chair;with family/visitor present Nurse  Communication: Mobility status PT Visit Diagnosis: Unsteadiness on feet (R26.81);Other abnormalities of gait and mobility (R26.89);Muscle weakness (generalized) (M62.81);Difficulty in walking, not elsewhere classified (R26.2);Pain Pain - Right/Left: Right Pain - part of body: Ankle and joints of foot    Time: 8366-2947 PT Time Calculation (min) (ACUTE ONLY): 30 min   Charges:         Logan Chaffin SPT    This entire session was performed under direct supervision and direction of a licensed Chiropractor . I have personally read, edited and approve of the note as written.  Collie Siad PT, DPT 04/06/2018, 1:52 PM

## 2018-04-06 NOTE — H&P (Signed)
Patient Demographics  Mandy Jones, is a 67 y.o. female   MRN: [id number]   DOB - Jan 02, 1951  Admit Date - 04/06/2018    Outpatient Primary MD for the patient is [name], Melba Coon, MD  Consult requested in the [facility] by [name], [place], On 04/06/2018    Reason for consult ; medical management and follow-up of atrial fibrillation, hypothyroidism. HPI  Mandy Jones  is a 67 y.o. female history of atrial fibrillation status post cardioversion 2 times, on anticoagulation with apixiban, beta-blockers admitted to podiatry service for elective right Achilles tendon reconstruction after failed conservative therapy for 1 year.  She will follows up with Dr. Nehemiah Massed for her atrial fibrillation, had cardioversion 2 times, started on amiodarone a month ago, according to her cardiology is planning for her for ablation of atrial fibrillation.  And had history of thyroid surgery in December after  that patient is having atrial fibrillation with issues with heart rate control.  CPAP at night for sleep apnea.  Patient had surgery this morning for right Achilles tendon reconstruction    Past Medical History:  Diagnosis Date  . Arthritis   . Depression   . Dyspnea   . Dysrhythmia   . Fibromyalgia 1992  . GERD (gastroesophageal reflux disease)   . Hepatitis    Hepatitis B 1985  . Mitral valve prolapse   . Sleep apnea    CPAP  . Tendon tear, ankle    rt foot  . Thyroid dysfunction       Past Surgical History:  Procedure Laterality Date  . CARDIOVERSION N/A 02/06/2018   Procedure: CARDIOVERSION;  Surgeon: Corey Skains, MD;  Location: [facility] ORS;  Service: Cardiovascular;  Laterality: N/A;  . CARDIOVERSION N/A 03/28/2018   Procedure: CARDIOVERSION;  Surgeon: Corey Skains, MD;  Location: [facility] OR[facility];  Service:  Cardiovascular;  Laterality: N/A;  . CHOLECYSTECTOMY    . CONTINUOUS NERVE MONITORING N/A 08/03/2017   Procedure: LARYNGEAL NERVE MONITORING;  Surgeon: Carloyn Manner, MD;  Location: [facility] ORS;  Service: ENT;  Laterality: N/A;  . EYE SURGERY Bilateral   . FOOT SURGERY Left 2000  . JOINT REPLACEMENT     left knee  . SHOULDER ARTHROSCOPY Bilateral   . SHOULDER ARTHROSCOPY Left   . THYROIDECTOMY N/A 08/03/2017   Procedure: THYROIDECTOMY;  Surgeon: Carloyn Manner, MD;  Location: [facility] ORS;  Service: ENT;  Laterality: N/A;  . TONSILLECTOMY    . TUBAL LIGATION             Review of System In addition to the HPI above,  No Fever-chills, No Headache, No changes with Vision or hearing, No problems swallowing food or Liquids, No Chest pain, Cough or Shortness of Breath, No Abdominal pain, No Nausea or Vommitting, Bowel movements are regular, No Blood in stool or Urine, No dysuria, No new skin rashes or bruises, Patient has right ankle dressing present, No new weakness, tingling, numbness in any extremity, No recent weight gain or loss, No polyuria, polydypsia or polyphagia,  No significant Mental Stressors.  A full 10 point Review of Systems was done, except as stated above, all other Review of Systems were negative.   Social History Social History   Tobacco Use  . Smoking status: Former Research scientist (life sciences)  . Smokeless tobacco: Never Used  . Tobacco comment: quit 30 years ago  Substance Use Topics  . Alcohol use: Yes    Comment: occassional     Family History History reviewed. No pertinent family history.   Prior to Admission medications   Medication Sig Start Date End Date Taking? Authorizing Provider  amiodarone (PACERONE) 400 MG tablet Take 400 mg by mouth daily.   Yes [provider]  apixaban (ELIQUIS) 5 MG TABS tablet Take 1 tablet (5 mg total) by mouth 2 (two) times daily. 12/15/17  Yes Gouru, Illene Silver, MD  atorvastatin (LIPITOR) 10 MG tablet Take 10 mg by mouth  daily. 05/05/17  Yes [provider]  azelastine (ASTELIN) 0.1 % nasal spray Place 1 spray into both nostrils daily as needed for rhinitis. Use in each nostril as directed   Yes [provider]  buPROPion (WELLBUTRIN XL) 300 MG 24 hr tablet Take 300 mg by mouth daily.  05/10/17  Yes [provider]  Calcium Carbonate-Vitamin D (CALCIUM 600+D PO) Take 1 tablet by mouth daily.   Yes [provider]  cycloSPORINE (RESTASIS) 0.05 % ophthalmic emulsion Place 1 drop into both eyes daily.    Yes [provider]  furosemide (LASIX) 20 MG tablet Take 20 mg by mouth daily. 05/19/17  Yes [provider]  Javier Docker Oil 500 MG CAPS Take 500 mg by mouth daily.   Yes [provider]  levothyroxine (SYNTHROID, LEVOTHROID) 200 MCG tablet Take 1 tablet (200 mcg total) by mouth daily before breakfast. 12/16/17  Yes Gouru, Aruna, MD  Melatonin 5 MG TABS Take 5 mg by mouth at bedtime as needed (sleep).    Yes [provider]  metoprolol tartrate (LOPRESSOR) 25 MG tablet Take 1 tablet (25 mg total) by mouth 2 (two) times daily. 12/15/17  Yes Gouru, Illene Silver, MD  multivitamin-lutein (OCUVITE-LUTEIN) CAPS capsule Take 2 capsules by mouth daily.    Yes [provider]  ranitidine (ZANTAC) 150 MG tablet Take 300 mg by mouth at bedtime.  10/05/16  Yes [provider]  traZODone (DESYREL) 50 MG tablet Take 50-150 mg by mouth at bedtime.  12/04/17  Yes [provider]  oxyCODONE-acetaminophen (PERCOCET) 5-325 MG tablet Take 1-2 tablets by mouth every 6 (six) hours as needed for severe pain. 04/06/18 04/06/19  [name], DPM    Anti-infectives (From admission, onward)   Start     Dose/Rate Route Frequency Ordered Stop   04/06/18 0605  ceFAZolin (ANCEF) 2-4 GM/100ML-% IVPB    Note to Pharmacy:  Norton Blizzard  : cabinet override      04/06/18 0605 04/06/18 0742   04/06/18 0600  ceFAZolin (ANCEF) IVPB 2g/100 mL premix     2 g 200 mL/hr  over 30 Minutes Intravenous On call to O.R. 04/05/18 2139 04/06/18 4128      Scheduled Meds: . amiodarone  400 mg Oral Daily  . apixaban  5 mg Oral BID  . [START ON 04/07/2018] atorvastatin  10 mg Oral Daily  . [START ON 04/07/2018] buPROPion  300 mg Oral Daily  . calcium-vitamin D   Oral Daily  . cycloSPORINE  1 drop Both Eyes Daily  . famotidine  20 mg Oral QHS  . furosemide  20  mg Oral Daily  . [START ON 04/07/2018] levothyroxine  200 mcg Oral QAC breakfast  . metoprolol tartrate  25 mg Oral BID  . [START ON 04/07/2018] multivitamin-lutein  2 capsule Oral Daily  . traZODone  50-150 mg Oral QHS   Continuous Infusions: PRN Meds:.azelastine, Melatonin, morphine injection, oxyCODONE-acetaminophen  No Known Allergies  Physical Exam  Vitals  Blood pressure 126/70, pulse 63, temperature (!) 97.2 F (36.2 C), temperature source Temporal, resp. rate 16, height 5\' 2"  (1.575 m), weight 100.3 kg, SpO2 96 %.   1. General alert, awake, oriented, sitting in the chair. 2. Normal affect and insight, Not Suicidal or Homicidal, Awake Alert, Oriented X 3.  3. No F.N deficits, ALL C.Nerves Intact, Strength 5/5 all 4 extremities, Sensation intact all 4 extremities, Plantars down going.  4. Ears and Eyes appear Normal, Conjunctivae clear, PERRLA. Moist Oral Mucosa.  5. Supple Neck, No JVD, No cervical lymphadenopathy appriciated, No Carotid Bruits.  6. Symmetrical Chest wall movement, Good air movement bilaterally, CTAB.  7. RRR, No Gallops, Rubs or Murmurs, No Parasternal Heave.  8. Positive Bowel Sounds, Abdomen Soft, No tenderness, No organomegaly appriciated,No rebound -guarding or rigidity.  9.  No Cyanosis, Normal Skin Turgor, No Skin Rash or Bruise.  10. Good muscle tone,  joints appear normal , no effusions, Normal ROM.  11. No Palpable Lymph Nodes in Neck or Axillae Patient has a history of present to right foot.  Surgery Data Review  CBC No results for input(s): WBC, HGB,  HCT, PLT, MCV, MCH, MCHC, RDW, LYMPHSABS, MONOABS, EOSABS, BASOSABS, BANDABS in the last 168 hours.  Invalid input(s): NEUTRABS, BANDSABD ------------------------------------------------------------------------------------------------------------------  Chemistries  No results for input(s): NA, K, CL, CO2, GLUCOSE, BUN, CREATININE, CALCIUM, MG, AST, ALT, ALKPHOS, BILITOT in the last 168 hours.  Invalid input(s): GFRCGP ------------------------------------------------------------------------------------------------------------------ estimated creatinine clearance is 73.6 mL/min (by C-G formula based on SCr of 0.78 mg/dL). ------------------------------------------------------------------------------------------------------------------ No results for input(s): TSH, T4TOTAL, T3FREE, THYROIDAB in the last 72 hours.  Invalid input(s): FREET3   Coagulation profile No results for input(s): INR, PROTIME in the last 168 hours. ------------------------------------------------------------------------------------------------------------------- No results for input(s): DDIMER in the last 72 hours. -------------------------------------------------------------------------------------------------------------------  Cardiac Enzymes No results for input(s): CKMB, TROPONINI, MYOGLOBIN in the last 168 hours.  Invalid input(s): CK ------------------------------------------------------------------------------------------------------------------ Invalid input(s): POCBNP   ---------------------------------------------------------------------------------------------------------------  Urinalysis No results found for: COLORURINE, APPEARANCEUR, LABSPEC, PHURINE, GLUCOSEU, HGBUR, BILIRUBINUR, KETONESUR, PROTEINUR, UROBILINOGEN, NITRITE, LEUKOCYTESUR   Imaging results:   No results found.     Assessment & Plan  Active Problems:   Post-operative pain    1.  #1 proximal atrial fibrillation:  Status post cardioversion 2 times, patient is back on apixaban, beta-blockers, so far hemodynamically stable with stable heart rate, stable blood pressure, continue monitor on telemetry.  If patient needs further titration of medication please consult cardiology with Dr. Nehemiah Massed.  Because of her history of A. fib with RVR and issues with that recommend Dr. Nehemiah Massed evaluation and follow-up. #2 status post postop right Achilles tendon repair consulted physical therapy, further management as per podiatry including pain management. 3.  Hypothyroidism status post thyroid surgery recently, on high-dose thyroid supplements.  Continue Synthyroid 200 mcg p.o. daily. 4.  Depression: Continue Wellbutrin 300 mg p.o. daily 5.  Hyperlipidemia: Continue simvastatin 10 mg p.o. daily.  DVT Prophylaxis with apixaban. AM Labs Ordered, also please review Full Orders  Family Communication: Plan discussed with patient .   Thank you for the consult, we will follow the patient with you  in the Hospital. Total time spent on this patient 55 minutes.  Epifanio Lesches M.D on 04/06/2018 at 3:43 PM  Note: This dictation was prepared with Dragon dictation along with smaller phrase technology. Any transcriptional errors that result from this process are unintentional.

## 2018-04-06 NOTE — Care Management Obs Status (Signed)
[place] NOTIFICATION   Patient Details  Name: Mandy Jones MRN: [id number] Date of Birth: 08-24-1951   Medicare Observation Status Notification Given:       [name], RN 04/06/2018, 12:47 PM

## 2018-04-06 NOTE — OR Nursing (Signed)
Physical Therapy in for evaluation 1130 am

## 2018-04-06 NOTE — Op Note (Signed)
Operative note   Surgeon:Willodean Leven Lawyer: None    Preop diagnosis: 1.  Calcaneal exostosis 2.  Achilles insertional tendinitis, all right lower extremity    Postop diagnosis: Same    Procedure: 1.  Right posterior calcaneal exostectomy 2.  Achilles tendon repair right heel    EBL: Minimal    Anesthesia:local and general.  Local consisted of 10 cc of 0.25% bupivacaine preoperatively.  A one-to-one mixture of 0.25% bupivacaine with epinephrine and Exparel long-acting anesthetic was infiltrated postoperatively.  A total of 30 cc was used    Hemostasis: Epinephrine infiltrated along the incision site    Specimen: Achilles tendinitis and calcaneal exostosis    Complications: None    Operative indications:Mandy Jones is an 67 y.o. that presents today for surgical intervention.  The risks/benefits/alternatives/complications have been discussed and consent has been given.    Procedure:  Patient was brought into the OR and placed on the operating table in theprone position. After anesthesia was obtained theright lower extremity was prepped and draped in usual sterile fashion.  A longitudinal incision made along the Achilles at its insertion to the calcaneus.  Sharp and blunt dissection carried down to the peritenon.  Peritenon was then opened.  A longitudinal splitting incision was made just superior to its insertion taken down on the posterior aspect of the calcaneus.  The Achilles tendon was then freed medial and lateral.  This exposed a large bone spur on the back of the calcaneus.  This was then excised with a combination of rongeur, osteotome, and power rasp.  The bone was smoothed down to healthy bleeding bone.  The wound was flushed with copious amounts of irrigation.  The Achilles tendon was then noted and had a large amount of scar tissue at its insertional site and was bulky.  The Achilles tendon was debulked and all of the abnormal tendon was removed.  This was taken  down to good healthy normal tendon.  The longitudinal split was then repaired with a 2-0 Vicryl.  Next a 4.5 mm bone anchor was placed into the posterior calcaneus.  A #2 Magnum wire was placed in the distal Achilles with a Brunelle stitch.  This was then stabilized and good stability was noted into the bone anchor as it was drilled into the heel.  It was hand tensioned and the bone ankle was released from the implantation device.  At this time good excursion of the foot with flexion of the calf was noted.  The wound was flushed with copious amounts of irrigation.  Peritenon and deeper layers were closed with a 3-0 Vicryl.  Subcutaneous tissue was closed with a 3-0 Vicryl and the skin closed with a 5-0 Monocryl undyed.  Patient was placed in an equalizer walker boot.    Patient tolerated the procedure and anesthesia well.  Was transported from the OR to the PACU with all vital signs stable and vascular status intact. To be discharged per routine protocol.  Will follow up in approximately 1 week in the outpatient clinic.

## 2018-04-07 MED ORDER — CALCIUM CARBONATE-VITAMIN D 500-200 MG-UNIT PO TABS
1.0000 | ORAL_TABLET | Freq: Every day | ORAL | Status: DC
  Administered 2018-04-10: 1 via ORAL
  Filled 2018-04-07 (×3): qty 1

## 2018-04-07 NOTE — Care Management Obs Status (Signed)
[place] NOTIFICATION   Patient Details  Name: Mandy Jones MRN: [id number] Date of Birth: 02-02-1951   Medicare Observation Status Notification Given:  Yes    Yaretzy Olazabal A Arion Shankles, RN 04/07/2018, 8:49 AM

## 2018-04-07 NOTE — Progress Notes (Signed)
Given on 8/9 but will not flow over for documentation.

## 2018-04-07 NOTE — Progress Notes (Signed)
Physical Therapy Treatment Patient Details Name: Mandy Jones MRN: [id number] DOB: [birth year] Today's Date: 04/07/2018    History of Present Illness Pt presents to hospital on 04/06/18 for an elective R achilles reconstruction after conservitive treatment of achillies tendinopathy has failed. Pt has a past medical history that includes GERD, PVD, hep B and sleep apnea    PT Comments    Pt in chair,  Reports throbbing R Le that is not easing up and requesting pain medication.  Primary nurse  Called and meds given.  Participated in exercises as described below.  Reviewed HEP and encouraged pt do participate on her own 2-4 times per day as tolerated.  Reviewed NWB status.  Voiced understanding.   Follow Up Recommendations  SNF     Equipment Recommendations  Wheelchair (measurements PT)    Recommendations for Other Services       Precautions / Restrictions Precautions Precautions: Fall Restrictions Weight Bearing Restrictions: Yes RLE Weight Bearing: Non weight bearing    Mobility  Bed Mobility Overal bed mobility: Needs Assistance Bed Mobility: Supine to Sit     Supine to sit: Supervision     General bed mobility comments: with use of rail and HOB up.  Transfers Overall transfer level: Needs assistance Equipment used: Standard walker Transfers: Sit to/from [employer] Transfers Sit to Stand: Min guard Stand pivot transfers: Min guard;Mod assist       General transfer comment: Mod assist needed due to LOB when transferring.  Ambulation/Gait             General Gait Details: Not assessed this visit due to pts inability to hop on L LE while maintaining R NWB status   Stairs             Wheelchair Mobility    Modified Rankin (Stroke Patients Only)       Balance Overall balance assessment: Needs assistance   Sitting balance-Leahy Scale: Good Sitting balance - Comments: able to sit without UE support and no LOB     Standing  balance-Leahy Scale: Poor Standing balance comment: Pt requires B UE and is still unable to maintain weight bearing status                            Cognition Arousal/Alertness: Awake/alert Behavior During Therapy: WFL for tasks assessed/performed Overall Cognitive Status: Within Functional Limits for tasks assessed                                        Exercises Other Exercises Other Exercises: HEP education and review. Ankle pumps L , toe wiggles R, SLR, Ab/add, heel slides 2 x 10.      General Comments        Pertinent Vitals/Pain Pain Assessment: 0-10 Pain Score: 7  Faces Pain Scale: Hurts even more Pain Location: R ankle Pain Descriptors / Indicators: Throbbing;Constant Pain Intervention(s): Limited activity within patient's tolerance;RN gave pain meds during session    Home Living                      Prior Function            PT Goals (current goals can now be found in the care plan section) Progress towards PT goals: Progressing toward goals    Frequency    BID  PT Plan Current plan remains appropriate    Co-evaluation              AM-PAC PT "6 Clicks" Daily Activity  Outcome Measure  Difficulty turning over in bed (including adjusting bedclothes, sheets and blankets)?: A Little Difficulty moving from lying on back to sitting on the side of the bed? : A Little Difficulty sitting down on and standing up from a chair with arms (e.g., wheelchair, bedside commode, etc,.)?: Unable Help needed moving to and from a bed to chair (including a wheelchair)?: A Lot Help needed walking in hospital room?: Total Help needed climbing 3-5 steps with a railing? : Total 6 Click Score: 11    End of Session Equipment Utilized During Treatment: Gait belt Activity Tolerance: Patient tolerated treatment well Patient left: in chair;with chair alarm set;with call bell/phone within reach;with nursing/sitter in room Nurse  Communication: Patient requests pain meds Pain - Right/Left: Right Pain - part of body: Ankle and joints of foot     Time: 1610-9604 PT Time Calculation (min) (ACUTE ONLY): 11 min  Charges:  $Therapeutic Exercise: 8-22 mins $Therapeutic Activity: 23-37 mins                     Chesley Noon, PTA 04/07/18, 12:04 PM

## 2018-04-07 NOTE — Plan of Care (Signed)
  Problem: Education: Goal: Knowledge of General Education information will improve Description: Including pain rating scale, medication(s)/side effects and non-pharmacologic comfort measures Outcome: Progressing   Problem: Health Behavior/Discharge Planning: Goal: Ability to manage health-related needs will improve Outcome: Progressing   Problem: Clinical Measurements: Goal: Ability to maintain clinical measurements within normal limits will improve Outcome: Progressing Goal: Will remain free from infection Outcome: Progressing Goal: Respiratory complications will improve Outcome: Progressing Goal: Cardiovascular complication will be avoided Outcome: Progressing   Problem: Activity: Goal: Risk for activity intolerance will decrease Outcome: Progressing   Problem: Nutrition: Goal: Adequate nutrition will be maintained Outcome: Progressing   Problem: Coping: Goal: Level of anxiety will decrease Outcome: Progressing   Problem: Elimination: Goal: Will not experience complications related to bowel motility Outcome: Progressing   Problem: Pain Managment: Goal: General experience of comfort will improve Outcome: Progressing   Problem: Safety: Goal: Ability to remain free from injury will improve Outcome: Progressing   Problem: Skin Integrity: Goal: Risk for impaired skin integrity will decrease Outcome: Progressing   

## 2018-04-07 NOTE — Progress Notes (Signed)
[facility]                                                      Patient Demographics  Mandy Jones, is a 67 y.o. female   MRN: [id number]   DOB - 1951/01/24  Admit Date - 04/06/2018    Outpatient Primary MD for the patient is [name], Melba Coon, MD  Consult requested in the Hospital by Saundra Shelling, MD, On 04/07/2018   With History of -  Past Medical History:  Diagnosis Date  . Arthritis   . Depression   . Dyspnea   . Dysrhythmia   . Fibromyalgia 1992  . GERD (gastroesophageal reflux disease)   . Hepatitis    Hepatitis B 1985  . Mitral valve prolapse   . Sleep apnea    CPAP  . Tendon tear, ankle    rt foot  . Thyroid dysfunction       Past Surgical History:  Procedure Laterality Date  . CARDIOVERSION N/A 02/06/2018   Procedure: CARDIOVERSION;  Surgeon: Corey Skains, MD;  Location: [facility];  Service: Cardiovascular;  Laterality: N/A;  . CARDIOVERSION N/A 03/28/2018   Procedure: CARDIOVERSION;  Surgeon: Corey Skains, MD;  Location: [facility] OR[facility];  Service: Cardiovascular;  Laterality: N/A;  . CHOLECYSTECTOMY    . CONTINUOUS NERVE MONITORING N/A 08/03/2017   Procedure: LARYNGEAL NERVE MONITORING;  Surgeon: Carloyn Manner, MD;  Location: [facility] OR[facility];  Service: ENT;  Laterality: N/A;  . EYE SURGERY Bilateral   . FOOT SURGERY Left 2000  . JOINT REPLACEMENT     left knee  . SHOULDER ARTHROSCOPY Bilateral   . SHOULDER ARTHROSCOPY Left   . THYROIDECTOMY N/A 08/03/2017   Procedure: THYROIDECTOMY;  Surgeon: Carloyn Manner, MD;  Location: [facility] OR[facility];  Service: ENT;  Laterality: N/A;  . TONSILLECTOMY    . TUBAL LIGATION      in for   No chief complaint on file.    HPI  Mandy Jones  is a 67 y.o. 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  She is resting  comfortably in her chair at this point.  She was scheduled originally to go home but physical therapy felt she was not capable of daily personal care due to her inability to stay nonweightbearing on this right foot which she has to do at this stage postop.  Subsequently we have kept her for observation and will see if we can place her in rehab for duration of time protection be able to bear weight.   Review of Systems:  She is alert and well-oriented and pleasant this morning.  In addition to the HPI above,  No Fever-chills, No Headache, No changes with Vision or hearing, No problems swallowing food or Liquids, No Chest pain, Cough or Shortness of Breath, No Abdominal pain, No Nausea or Vommitting, Bowel movements are regular, No Blood in stool or Urine, No dysuria, No new skin rashes or bruises, No new joints pains-aches,  No new weakness, tingling, numbness in any extremity, No recent weight gain or loss, No polyuria, polydypsia or polyphagia, No significant Mental Stressors.  A full 10 point Review of Systems was done, except as stated above, all other Review of Systems were negative.   Social History Social History   Tobacco  Use  . Smoking status: Former Smoker  . Smokeless tobacco: Never Used  . Tobacco comment: quit 30 years ago  Substance Use Topics  . Alcohol use: Yes    Comment: occassional    Family History History reviewed. No pertinent family history.  Prior to Admission medications   Medication Sig Start Date End Date Taking? Authorizing Provider  amiodarone (PACERONE) 400 MG tablet Take 400 mg by mouth daily.   Yes [provider]  apixaban (ELIQUIS) 5 MG TABS tablet Take 1 tablet (5 mg total) by mouth 2 (two) times daily. 12/15/17  Yes Gouru, Illene Silver, MD  atorvastatin (LIPITOR) 10 MG tablet Take 10 mg by mouth daily. 05/05/17  Yes [provider]  azelastine (ASTELIN) 0.1 % nasal spray Place 1 spray into both nostrils daily as needed for  rhinitis. Use in each nostril as directed   Yes [provider]  buPROPion (WELLBUTRIN XL) 300 MG 24 hr tablet Take 300 mg by mouth daily.  05/10/17  Yes [provider]  Calcium Carbonate-Vitamin D (CALCIUM 600+D PO) Take 1 tablet by mouth daily.   Yes [provider]  cycloSPORINE (RESTASIS) 0.05 % ophthalmic emulsion Place 1 drop into both eyes daily.    Yes [provider]  furosemide (LASIX) 20 MG tablet Take 20 mg by mouth daily. 05/19/17  Yes [provider]  Javier Docker Oil 500 MG CAPS Take 500 mg by mouth daily.   Yes [provider]  levothyroxine (SYNTHROID, LEVOTHROID) 200 MCG tablet Take 1 tablet (200 mcg total) by mouth daily before breakfast. 12/16/17  Yes Gouru, Aruna, MD  Melatonin 5 MG TABS Take 5 mg by mouth at bedtime as needed (sleep).    Yes [provider]  metoprolol tartrate (LOPRESSOR) 25 MG tablet Take 1 tablet (25 mg total) by mouth 2 (two) times daily. 12/15/17  Yes Gouru, Illene Silver, MD  multivitamin-lutein (OCUVITE-LUTEIN) CAPS capsule Take 2 capsules by mouth daily.    Yes [provider]  ranitidine (ZANTAC) 150 MG tablet Take 300 mg by mouth at bedtime.  10/05/16  Yes [provider]  traZODone (DESYREL) 50 MG tablet Take 50-150 mg by mouth at bedtime.  12/04/17  Yes [provider]  oxyCODONE-acetaminophen (PERCOCET) 5-325 MG tablet Take 1-2 tablets by mouth every 6 (six) hours as needed for severe pain. 04/06/18 04/06/19  [name], DPM    Anti-infectives (From admission, onward)   Start     Dose/Rate Route Frequency Ordered Stop   04/06/18 0605  ceFAZolin (ANCEF) 2-4 GM/100ML-% IVPB    Note to Pharmacy:  Norton Blizzard  : cabinet override      04/06/18 0605 04/06/18 0742   04/06/18 0600  ceFAZolin (ANCEF) IVPB 2g/100 mL premix     2 g 200 mL/hr over 30 Minutes Intravenous On call to O.R. 04/05/18 2139 04/06/18 4098      Scheduled Meds: . amiodarone  400 mg Oral Daily  .  apixaban  5 mg Oral BID  . atorvastatin  10 mg Oral Daily  . buPROPion  300 mg Oral Daily  . calcium-vitamin D  1 tablet Oral Daily  . cycloSPORINE  1 drop Both Eyes Daily  . famotidine  20 mg Oral QHS  . furosemide  20 mg Oral Daily  . levothyroxine  200 mcg Oral QAC breakfast  . metoprolol tartrate  25 mg Oral BID  . multivitamin-lutein  2 capsule Oral Daily  . traZODone  50-150 mg Oral QHS   Continuous Infusions:  PRN Meds:.azelastine, Melatonin, morphine injection, oxyCODONE-acetaminophen  No Known Allergies  Physical Exam  Vitals  Blood pressure 99/61, pulse 61, temperature 97.8 F (36.6 C), temperature source Oral, resp. rate 19, height 5\' 2"  (1.575 m), weight 100.3 kg, SpO2 94 %.  Lower Extremity exam: Dressing is dry clean and intact.  Equalizer walker is intact. Currently she complains of only minimal pain.  Did start taking pain medicine about an hour ago.  Popliteal blocks likely starting to wear off. Assessment & Plan: Leave dressing is clean dry and intact with the boot intact.  I spoke with her about the remaining nonweightbearing whenever transferring.  Emphasized the strong need to put no weight on the right foot.  Spoke with care management and she should be able to be transferred to a skilled facility for rehab either Monday or Tuesday.  Also explained to her that she does have severe pain there is morphine ordered for intravenously if she needs it.  I will follow her again tomorrow.  Active Problems:   Post-operative pain   Family Communication: Plan discussed with patient   Albertine Patricia M.D on 04/07/2018 at 10:40 AM  Thank you for the consult, we will follow the patient with you in the Hospital.

## 2018-04-07 NOTE — Progress Notes (Signed)
Physical Therapy Treatment Patient Details Name: Mandy Jones MRN: [id number] DOB: [birth year] Today's Date: 04/07/2018    History of Present Illness Pt presents to hospital on 04/06/18 for an elective R achilles reconstruction after conservitive treatment of achillies tendinopathy has failed. Pt has a past medical history that includes GERD, PVD, hep B and sleep apnea    PT Comments    Pt in bed, ready for session.  To edge of bed with use of rail and HOB up.  Transferred to commode with min assist and standard walker.  She needed assist to manage clothing.  Stood for care where she needed to lean on walker handles unsafely to perform self-care.  Educated for safety and assist provided.  During transfer to wheelchair, she had a LOB requiring mod assist to guide her to the chair and prevent injury.  She did put weight on her foot during LOB and reported increased pain initially.  Discussed home access.  Plan was to practice up/down stairs on her bottom.  Discussed technique and she expressed concern due to bilateral rotator cuff surgeries leaving her with decreased strength along with a TKR on LLE.  Pt decided seated method on stairs would not be doable since she would be unable to find 2 strong people to help her down onto steps and up from the floor once she was in her home.  She elected to go with ambulance transfer home if necessary.  Attempted squat pivot training if she was to return home as transfer with walker is not safe at this time and concern for an untrained friend or neighbor helping her would not be ideal given her significant LOB today requiring assist to prevent fall.  She was fearful with squat pivot transfer so sliding board was obtained.  While she was more comfortable with sliding board she still required min assist for safety.    At home pt has a high bed where her toes only reach the floor when seated on it.  If she was to return home she would need a hospital bed, drop arm  or bench commode to allow for sliding board transfers, wheelchair with drop arm and elevating leg rests, sliding board and gait belt for safety.  Further training with family/friends would also be recommended but she is doubtful that she could find enough assistance. Ambulance transfer home would also be necessary to enter home.  HHPT , HHOT and assist for mobility would be recommended.  Pt labile during session and emotional support given.  She is aware of her physical deficits and voiced concern for her safety and ability to manage at home.  I share her concerns.  SNF remains appropriate for a safe and successful discharge.   Follow Up Recommendations  SNF     Equipment Recommendations  Wheelchair (measurements PT)  - see above   Recommendations for Other Services       Precautions / Restrictions Precautions Precautions: Fall Restrictions Weight Bearing Restrictions: Yes RLE Weight Bearing: Non weight bearing    Mobility  Bed Mobility Overal bed mobility: Needs Assistance Bed Mobility: Supine to Sit     Supine to sit: Supervision     General bed mobility comments: with use of rail and HOB up.  Transfers Overall transfer level: Needs assistance Equipment used: Standard walker Transfers: Sit to/from [employer] Transfers Sit to Stand: Min guard Stand pivot transfers: Min guard;Mod assist       General transfer comment: Mod assist needed due to LOB when  transferring.  Ambulation/Gait             General Gait Details: Not assessed this visit due to pts inability to hop on L LE while maintaining R NWB status   Stairs             Wheelchair Mobility    Modified Rankin (Stroke Patients Only)       Balance Overall balance assessment: Needs assistance   Sitting balance-Leahy Scale: Good Sitting balance - Comments: able to sit without UE support and no LOB     Standing balance-Leahy Scale: Poor Standing balance comment: Pt requires B UE and is  still unable to maintain weight bearing status                            Cognition Arousal/Alertness: Awake/alert Behavior During Therapy: WFL for tasks assessed/performed Overall Cognitive Status: Within Functional Limits for tasks assessed                                        Exercises Other Exercises Other Exercises: sliding board transfers.    General Comments        Pertinent Vitals/Pain Pain Assessment: Faces Faces Pain Scale: Hurts even more Pain Location: R ankle - when she put increased weight on her foot during LOB with transfer. Pain Intervention(s): Limited activity within patient's tolerance;Monitored during session    Home Living                      Prior Function            PT Goals (current goals can now be found in the care plan section) Progress towards PT goals: Progressing toward goals    Frequency    BID      PT Plan Current plan remains appropriate    Co-evaluation              AM-PAC PT "6 Clicks" Daily Activity  Outcome Measure  Difficulty turning over in bed (including adjusting bedclothes, sheets and blankets)?: A Little Difficulty moving from lying on back to sitting on the side of the bed? : A Little Difficulty sitting down on and standing up from a chair with arms (e.g., wheelchair, bedside commode, etc,.)?: Unable Help needed moving to and from a bed to chair (including a wheelchair)?: A Lot Help needed walking in hospital room?: Total Help needed climbing 3-5 steps with a railing? : Total 6 Click Score: 11    End of Session Equipment Utilized During Treatment: Gait belt Activity Tolerance: Patient tolerated treatment well;Patient limited by fatigue;Other (comment) Patient left: in chair;with chair alarm set;with call bell/phone within reach Nurse Communication: Mobility status Pain - Right/Left: Right Pain - part of body: Ankle and joints of foot     Time: 0931-1003 PT Time  Calculation (min) (ACUTE ONLY): 32 min  Charges:  $Therapeutic Activity: 23-37 mins                     Chesley Noon, PTA 04/07/18, 10:43 AM

## 2018-04-07 NOTE — Progress Notes (Signed)
[place] at [place] NAME: Mandy Jones    MR#:  [id number]  DATE OF BIRTH:  [birth year]  SUBJECTIVE:  Patient seen and evaluated today Has decreased pain in the right heel Has right lower extremity brace No chest pain, shortness of breath  REVIEW OF SYSTEMS:    ROS  CONSTITUTIONAL: No documented fever. No fatigue, weakness. No weight gain, no weight loss.  EYES: No blurry or double vision.  ENT: No tinnitus. No postnasal drip. No redness of the oropharynx.  RESPIRATORY: No cough, no wheeze, no hemoptysis. No dyspnea.  CARDIOVASCULAR: No chest pain. No orthopnea. No palpitations. No syncope.  GASTROINTESTINAL: No nausea, no vomiting or diarrhea. No abdominal pain. No melena or hematochezia.  GENITOURINARY: No dysuria or hematuria.  ENDOCRINE: No polyuria or nocturia. No heat or cold intolerance.  HEMATOLOGY: No anemia. No bruising. No bleeding.  INTEGUMENTARY: No rashes. No lesions.  MUSCULOSKELETAL: No arthritis. No swelling. No gout.  Decreased right heel pain NEUROLOGIC: No numbness, tingling, or ataxia. No seizure-type activity.  PSYCHIATRIC: No anxiety. No insomnia. No ADD.   DRUG ALLERGIES:  No Known Allergies  VITALS:  Blood pressure 99/61, pulse 61, temperature 97.8 F (36.6 C), temperature source Oral, resp. rate 19, height 5\' 2"  (1.575 m), weight 100.3 kg, SpO2 94 %.  PHYSICAL EXAMINATION:   Physical Exam  GENERAL:  67 y.o.-year-old patient lying in the bed with no acute distress.  EYES: Pupils equal, round, reactive to light and accommodation. No scleral icterus. Extraocular muscles intact.  HEENT: Head atraumatic, normocephalic. Oropharynx and nasopharynx clear.  NECK:  Supple, no jugular venous distention. No thyroid enlargement, no tenderness.  LUNGS: Normal breath sounds bilaterally, no wheezing, rales, rhonchi. No use of accessory muscles of respiration.  CARDIOVASCULAR: S1, S2 normal. No murmurs, rubs, or  gallops.  ABDOMEN: Soft, nontender, nondistended. Bowel sounds present. No organomegaly or mass.  EXTREMITIES: No cyanosis, clubbing or edema b/l.    Right lower extremity brace NEUROLOGIC: Cranial nerves II through XII are intact. No focal Motor or sensory deficits b/l.   PSYCHIATRIC: The patient is alert and oriented x 3.  SKIN: No obvious rash, lesion, or ulcer.   LABORATORY PANEL:   CBC No results for input(s): WBC, HGB, HCT, PLT in the last 168 hours. ------------------------------------------------------------------------------------------------------------------ Chemistries  No results for input(s): NA, K, CL, CO2, GLUCOSE, BUN, CREATININE, CALCIUM, MG, AST, ALT, ALKPHOS, BILITOT in the last 168 hours.  Invalid input(s): GFRCGP ------------------------------------------------------------------------------------------------------------------  Cardiac Enzymes No results for input(s): TROPONINI in the last 168 hours. ------------------------------------------------------------------------------------------------------------------  RADIOLOGY:  No results found.   ASSESSMENT AND PLAN:   67 year old female patient under podiatric service with history of sleep apnea, fibromyalgia, GERD, arthritis status post Achilles tendon repair  -Paroxysmal atrial fibrillation Status post cardioversion x2 in the past Currently on anticoagulation with apixaban Continue beta-blocker for rate control  -Status post right Achilles tendon repair Right lower extremity nonweightbearing Appreciate physical therapy evaluation and podiatry evaluation SNF placement Case management and social worker follow-up  -Hypothyroidism Continue Synthroid  -Hyperlipidemia Continue simvastatin  -DVT prophylaxis On anticoagulation with apixaban  All the records are reviewed and case discussed with Care Management/Social Worker. Management plans discussed with the patient, family and they are in  agreement.  CODE STATUS: Full code  DVT Prophylaxis: SCDs  TOTAL TIME TAKING CARE OF THIS PATIENT: 35 minutes.   POSSIBLE D/C IN 1 to 2 DAYS, DEPENDING ON CLINICAL CONDITION.  Saundra Shelling M.D on 04/07/2018 at 11:48  AM  Between 7am to 6pm - Pager - [phone]  After 6pm go to [email or web address] - password [email or web address] [facility]  Office  [phone]  CC: Primary care physician; Elaina Pattee, MD  Note: This dictation was prepared with Dragon dictation along with smaller phrase technology. Any transcriptional errors that result from this process are unintentional.

## 2018-04-08 DIAGNOSIS — I739 Peripheral vascular disease, unspecified: Secondary | ICD-10-CM | POA: Diagnosis present

## 2018-04-08 DIAGNOSIS — Z79899 Other long term (current) drug therapy: Secondary | ICD-10-CM | POA: Diagnosis not present

## 2018-04-08 DIAGNOSIS — I480 Paroxysmal atrial fibrillation: Secondary | ICD-10-CM | POA: Diagnosis present

## 2018-04-08 DIAGNOSIS — I341 Nonrheumatic mitral (valve) prolapse: Secondary | ICD-10-CM | POA: Diagnosis present

## 2018-04-08 DIAGNOSIS — I6523 Occlusion and stenosis of bilateral carotid arteries: Secondary | ICD-10-CM | POA: Diagnosis present

## 2018-04-08 DIAGNOSIS — M7661 Achilles tendinitis, right leg: Secondary | ICD-10-CM | POA: Diagnosis present

## 2018-04-08 DIAGNOSIS — J449 Chronic obstructive pulmonary disease, unspecified: Secondary | ICD-10-CM | POA: Diagnosis present

## 2018-04-08 DIAGNOSIS — F329 Major depressive disorder, single episode, unspecified: Secondary | ICD-10-CM | POA: Diagnosis present

## 2018-04-08 DIAGNOSIS — M797 Fibromyalgia: Secondary | ICD-10-CM | POA: Diagnosis present

## 2018-04-08 DIAGNOSIS — E039 Hypothyroidism, unspecified: Secondary | ICD-10-CM | POA: Diagnosis present

## 2018-04-08 DIAGNOSIS — M898X7 Other specified disorders of bone, ankle and foot: Secondary | ICD-10-CM | POA: Diagnosis present

## 2018-04-08 DIAGNOSIS — M1990 Unspecified osteoarthritis, unspecified site: Secondary | ICD-10-CM | POA: Diagnosis present

## 2018-04-08 DIAGNOSIS — G8918 Other acute postprocedural pain: Secondary | ICD-10-CM | POA: Diagnosis present

## 2018-04-08 DIAGNOSIS — M899 Disorder of bone, unspecified: Secondary | ICD-10-CM | POA: Diagnosis present

## 2018-04-08 DIAGNOSIS — Z96652 Presence of left artificial knee joint: Secondary | ICD-10-CM | POA: Diagnosis present

## 2018-04-08 DIAGNOSIS — E785 Hyperlipidemia, unspecified: Secondary | ICD-10-CM | POA: Diagnosis present

## 2018-04-08 DIAGNOSIS — I2510 Atherosclerotic heart disease of native coronary artery without angina pectoris: Secondary | ICD-10-CM | POA: Diagnosis present

## 2018-04-08 DIAGNOSIS — Z9989 Dependence on other enabling machines and devices: Secondary | ICD-10-CM | POA: Diagnosis not present

## 2018-04-08 DIAGNOSIS — K219 Gastro-esophageal reflux disease without esophagitis: Secondary | ICD-10-CM | POA: Diagnosis present

## 2018-04-08 DIAGNOSIS — M7731 Calcaneal spur, right foot: Secondary | ICD-10-CM | POA: Diagnosis present

## 2018-04-08 DIAGNOSIS — Z8619 Personal history of other infectious and parasitic diseases: Secondary | ICD-10-CM | POA: Diagnosis not present

## 2018-04-08 DIAGNOSIS — Z87891 Personal history of nicotine dependence: Secondary | ICD-10-CM | POA: Diagnosis not present

## 2018-04-08 DIAGNOSIS — Z7901 Long term (current) use of anticoagulants: Secondary | ICD-10-CM | POA: Diagnosis not present

## 2018-04-08 DIAGNOSIS — G4730 Sleep apnea, unspecified: Secondary | ICD-10-CM | POA: Diagnosis present

## 2018-04-08 LAB — HIV ANTIBODY (ROUTINE TESTING W REFLEX): HIV Screen 4th Generation wRfx: NONREACTIVE

## 2018-04-08 LAB — CREATININE, SERUM
Creatinine, Ser: 0.63 mg/dL (ref 0.44–1.00)
GFR calc Af Amer: >60 mL/min (ref >60)
GFR calc non Af Amer: >60 mL/min (ref >60)

## 2018-04-08 LAB — CBC
HEMATOCRIT: 37.7 % (ref 35.0–47.0)
HEMOGLOBIN: 12.8 g/dL (ref 12.0–16.0)
MCH: 29.7 pg (ref 26.0–34.0)
MCHC: 33.9 g/dL (ref 32.0–36.0)
MCV: 87.5 fL (ref 80.0–100.0)
Platelets: 182 10*3/uL (ref 150–440)
RBC: 4.31 MIL/uL (ref 3.80–5.20)
RDW: 15.2 % — ABNORMAL HIGH (ref 11.5–14.5)
WBC: 6.2 10*3/uL (ref 3.6–11.0)

## 2018-04-08 NOTE — Progress Notes (Signed)
[place] at [place] NAME: Mandy Jones    MR#:  [id number]  DATE OF BIRTH:  06-Feb-1951  SUBJECTIVE:  Patient seen and evaluated today Has right lower extremity brace No chest pain, shortness of breath  REVIEW OF SYSTEMS:    ROS  CONSTITUTIONAL: No documented fever. No fatigue, weakness. No weight gain, no weight loss.  EYES: No blurry or double vision.  ENT: No tinnitus. No postnasal drip. No redness of the oropharynx.  RESPIRATORY: No cough, no wheeze, no hemoptysis. No dyspnea.  CARDIOVASCULAR: No chest pain. No orthopnea. No palpitations. No syncope.  GASTROINTESTINAL: No nausea, no vomiting or diarrhea. No abdominal pain. No melena or hematochezia.  GENITOURINARY: No dysuria or hematuria.  ENDOCRINE: No polyuria or nocturia. No heat or cold intolerance.  HEMATOLOGY: No anemia. No bruising. No bleeding.  INTEGUMENTARY: No rashes. No lesions.  MUSCULOSKELETAL: No arthritis. No swelling. No gout.  Decreased right heel pain NEUROLOGIC: No numbness, tingling, or ataxia. No seizure-type activity.  PSYCHIATRIC: No anxiety. No insomnia. No ADD.   DRUG ALLERGIES:  No Known Allergies  VITALS:  Blood pressure 119/78, pulse 65, temperature 97.7 F (36.5 C), temperature source Oral, resp. rate 17, height 5\' 2"  (1.575 m), weight 100.3 kg, SpO2 97 %.  PHYSICAL EXAMINATION:   Physical Exam  GENERAL:  67 y.o.-year-old patient lying in the bed with no acute distress.  EYES: Pupils equal, round, reactive to light and accommodation. No scleral icterus. Extraocular muscles intact.  HEENT: Head atraumatic, normocephalic. Oropharynx and nasopharynx clear.  NECK:  Supple, no jugular venous distention. No thyroid enlargement, no tenderness.  LUNGS: Normal breath sounds bilaterally, no wheezing, rales, rhonchi. No use of accessory muscles of respiration.  CARDIOVASCULAR: S1, S2 normal. No murmurs, rubs, or gallops.  ABDOMEN: Soft, nontender,  nondistended. Bowel sounds present. No organomegaly or mass.  EXTREMITIES: No cyanosis, clubbing or edema b/l.    Right lower extremity brace NEUROLOGIC: Cranial nerves II through XII are intact. No focal Motor or sensory deficits b/l.   PSYCHIATRIC: The patient is alert and oriented x 3.  SKIN: No obvious rash, lesion, or ulcer.   LABORATORY PANEL:   CBC Recent Labs  Lab 04/08/18 0658  WBC 6.2  HGB 12.8  HCT 37.7  PLT 182   ------------------------------------------------------------------------------------------------------------------ Chemistries  Recent Labs  Lab 04/08/18 0658  CREATININE 0.63   ------------------------------------------------------------------------------------------------------------------  Cardiac Enzymes No results for input(s): TROPONINI in the last 168 hours. ------------------------------------------------------------------------------------------------------------------  RADIOLOGY:  No results found.   ASSESSMENT AND PLAN:   67 year old female patient under podiatric service with history of sleep apnea, fibromyalgia, GERD, arthritis status post Achilles tendon repair  -Paroxysmal atrial fibrillation Status post cardioversion x2 in the past Currently on anticoagulation with apixaban Continue beta-blocker for rate control  -Status post right Achilles tendon repair Right lower extremity nonweightbearing Appreciate physical therapy evaluation and podiatry evaluation SNF placement Case management and social worker follow-up  -Hypothyroidism Continue Synthroid  -Hyperlipidemia Continue simvastatin  -DVT prophylaxis On anticoagulation with apixaban  -s/p PT evaluation SNF placement in AM  All the records are reviewed and case discussed with Care Management/Social Worker. Management plans discussed with the patient, family and they are in agreement.  CODE STATUS: Full code  DVT Prophylaxis: SCDs  TOTAL TIME TAKING CARE OF THIS  PATIENT: 23 minutes.   POSSIBLE D/C IN 1 to 2 DAYS, DEPENDING ON CLINICAL CONDITION.  Saundra Shelling M.D on 04/08/2018 at 10:35 AM  Between 7am to 6pm -  Pager - [phone]  After 6pm go to [email or web address] - password [email or web address] [facility]  Office  [phone]  CC: Primary care physician; Elaina Pattee, MD  Note: This dictation was prepared with Dragon dictation along with smaller phrase technology. Any transcriptional errors that result from this process are unintentional.

## 2018-04-08 NOTE — Plan of Care (Signed)
  Problem: Health Behavior/Discharge Planning: Goal: Ability to manage health-related needs will improve Outcome: Progressing   Problem: Clinical Measurements: Goal: Ability to maintain clinical measurements within normal limits will improve Outcome: Progressing Goal: Will remain free from infection Outcome: Progressing Goal: Cardiovascular complication will be avoided Outcome: Progressing   Problem: Activity: Goal: Risk for activity intolerance will decrease Outcome: Progressing   Problem: Elimination: Goal: Will not experience complications related to bowel motility Outcome: Progressing   Problem: Pain Managment: Goal: General experience of comfort will improve Outcome: Progressing   Problem: Safety: Goal: Ability to remain free from injury will improve Outcome: Progressing   Problem: Skin Integrity: Goal: Risk for impaired skin integrity will decrease Outcome: Progressing

## 2018-04-08 NOTE — Progress Notes (Signed)
[facility] Podiatry                                                      Patient Demographics  Mandy Jones, is a 67 y.o. female   MRN: [id number]   DOB - March 24, 1951  Admit Date - 04/06/2018    Outpatient Primary MD for the patient is [name], Melba Coon, MD  Consult requested in the Hospital by Saundra Shelling, MD, On 04/08/2018    Past Medical History:  Diagnosis Date  . Arthritis   . Depression   . Dyspnea   . Dysrhythmia   . Fibromyalgia 1992  . GERD (gastroesophageal reflux disease)   . Hepatitis    Hepatitis B 1985  . Mitral valve prolapse   . Sleep apnea    CPAP  . Tendon tear, ankle    rt foot  . Thyroid dysfunction       Past Surgical History:  Procedure Laterality Date  . CARDIOVERSION N/A 02/06/2018   Procedure: CARDIOVERSION;  Surgeon: Corey Skains, MD;  Location: [facility] ORS;  Service: Cardiovascular;  Laterality: N/A;  . CARDIOVERSION N/A 03/28/2018   Procedure: CARDIOVERSION;  Surgeon: Corey Skains, MD;  Location: [facility] ORS;  Service: Cardiovascular;  Laterality: N/A;  . CHOLECYSTECTOMY    . CONTINUOUS NERVE MONITORING N/A 08/03/2017   Procedure: LARYNGEAL NERVE MONITORING;  Surgeon: Carloyn Manner, MD;  Location: [facility] ORS;  Service: ENT;  Laterality: N/A;  . EYE SURGERY Bilateral   . FOOT SURGERY Left 2000  . JOINT REPLACEMENT     left knee  . SHOULDER ARTHROSCOPY Bilateral   . SHOULDER ARTHROSCOPY Left   . THYROIDECTOMY N/A 08/03/2017   Procedure: THYROIDECTOMY;  Surgeon: Carloyn Manner, MD;  Location: [facility] ORS;  Service: ENT;  Laterality: N/A;  . TONSILLECTOMY    . TUBAL LIGATION      in for   No chief complaint on file.    HPI  Mandy Jones  is a 67 y.o. female, 2 days status post secondary Achilles repair with removal of tendon calcinosis and spurring to the back of the right heel    Review of Systems:  Patient is alert and well-oriented  In addition to the HPI above,  No Fever-chills, No Headache, No changes with Vision or hearing, No problems swallowing food or Liquids, No Chest pain, Cough or Shortness of Breath, No Abdominal pain, No Nausea or Vommitting, Bowel movements are regular, No Blood in stool or Urine, No dysuria, No new skin rashes or bruises, No new joints pains-aches,  No new weakness, tingling, numbness in any extremity, No recent weight gain or loss, No polyuria, polydypsia or polyphagia, No significant Mental Stressors.  A full 10 point Review of Systems was done, except as stated above, all other Review of Systems were negative.   Social History Social History   Tobacco Use  . Smoking status: Former Research scientist (life sciences)  . Smokeless tobacco: Never Used  . Tobacco comment: quit 30 years ago  Substance Use Topics  . Alcohol use: Yes    Comment: occassional    Family History History reviewed. No pertinent family history.  Prior to Admission medications   Medication Sig Start Date End Date Taking? Authorizing Provider  amiodarone (PACERONE) 400 MG tablet Take 400 mg by mouth daily.   Yes [provider]  apixaban Arne Cleveland)  5 MG TABS tablet Take 1 tablet (5 mg total) by mouth 2 (two) times daily. 12/15/17  Yes Gouru, Illene Silver, MD  atorvastatin (LIPITOR) 10 MG tablet Take 10 mg by mouth daily. 05/05/17  Yes [provider]  azelastine (ASTELIN) 0.1 % nasal spray Place 1 spray into both nostrils daily as needed for rhinitis. Use in each nostril as directed   Yes [provider]  buPROPion (WELLBUTRIN XL) 300 MG 24 hr tablet Take 300 mg by mouth daily.  05/10/17  Yes [provider]  Calcium Carbonate-Vitamin D (CALCIUM 600+D PO) Take 1 tablet by mouth daily.   Yes [provider]  cycloSPORINE (RESTASIS) 0.05 % ophthalmic emulsion Place 1 drop into both eyes daily.    Yes [provider]  furosemide (LASIX) 20 MG tablet Take  20 mg by mouth daily. 05/19/17  Yes [provider]  Javier Docker Oil 500 MG CAPS Take 500 mg by mouth daily.   Yes [provider]  levothyroxine (SYNTHROID, LEVOTHROID) 200 MCG tablet Take 1 tablet (200 mcg total) by mouth daily before breakfast. 12/16/17  Yes Gouru, Aruna, MD  Melatonin 5 MG TABS Take 5 mg by mouth at bedtime as needed (sleep).    Yes [provider]  metoprolol tartrate (LOPRESSOR) 25 MG tablet Take 1 tablet (25 mg total) by mouth 2 (two) times daily. 12/15/17  Yes Gouru, Illene Silver, MD  multivitamin-lutein (OCUVITE-LUTEIN) CAPS capsule Take 2 capsules by mouth daily.    Yes [provider]  ranitidine (ZANTAC) 150 MG tablet Take 300 mg by mouth at bedtime.  10/05/16  Yes [provider]  traZODone (DESYREL) 50 MG tablet Take 50-150 mg by mouth at bedtime.  12/04/17  Yes [provider]  oxyCODONE-acetaminophen (PERCOCET) 5-325 MG tablet Take 1-2 tablets by mouth every 6 (six) hours as needed for severe pain. 04/06/18 04/06/19  [name], DPM    Anti-infectives (From admission, onward)   Start     Dose/Rate Route Frequency Ordered Stop   04/06/18 0605  ceFAZolin (ANCEF) 2-4 GM/100ML-% IVPB    Note to Pharmacy:  Norton Blizzard  : cabinet override      04/06/18 0605 04/06/18 0742   04/06/18 0600  ceFAZolin (ANCEF) IVPB 2g/100 mL premix     2 g 200 mL/hr over 30 Minutes Intravenous On call to O.R. 04/05/18 2139 04/06/18 2774      Scheduled Meds: . amiodarone  400 mg Oral Daily  . apixaban  5 mg Oral BID  . atorvastatin  10 mg Oral Daily  . buPROPion  300 mg Oral Daily  . calcium-vitamin D  1 tablet Oral Daily  . cycloSPORINE  1 drop Both Eyes Daily  . famotidine  20 mg Oral QHS  . furosemide  20 mg Oral Daily  . levothyroxine  200 mcg Oral QAC breakfast  . metoprolol tartrate  25 mg Oral BID  . multivitamin-lutein  2 capsule Oral Daily  . traZODone  50-150 mg Oral QHS   Continuous Infusions: PRN Meds:.azelastine,  Melatonin, morphine injection, oxyCODONE-acetaminophen  No Known Allergies  Physical Exam: Patient states her pain is pretty well controlled but she did have take some IV morphine yesterday and is currently just taking an oral pain medicine but she may need the morphine later today.  Vitals  Blood pressure 119/78, pulse 65, temperature 97.7 F (36.5 C), temperature source Oral, resp. rate 17, height 5\' 2"  (1.575 m), weight 100.3 kg, SpO2 97 %.  Lower Extremity exam: Dressings intact  boots intact.  Pain level is stable Data Review  CBC Recent Labs  Lab 04/08/18 0658  WBC 6.2  HGB 12.8  HCT 37.7  PLT 182  MCV 87.5  MCH 29.7  MCHC 33.9  RDW 15.2*   ------------------------------------------------------------------------------------------------------------------  Chemistries  Recent Labs  Lab 04/08/18 0658  CREATININE 0.63   ------------------------------------------------------------------------------------------------------------------ estimated creatinine clearance is 73.6 mL/min (by C-G formula based on SCr of 0.63 mg/dL). ------------------------------------------------------------------------------------------------------------------ No results for input(s): TSH, T4TOTAL, T3FREE, THYROIDAB in the last 72 hours.  Invalid input(s): FREET3 Urinalysis No results found for: COLORURINE, APPEARANCEUR, LABSPEC, PHURINE, GLUCOSEU, HGBUR, BILIRUBINUR, KETONESUR, PROTEINUR, UROBILINOGEN, NITRITE, LEUKOCYTESUR   Imaging results:   No results found.  Assessment & Plan: Continue current pain regimen wait for rehab placement tomorrow.  Patient is remain nonweightbearing on the right side.  Active Problems:   Post-operative pain   Family Communication: Plan discussed with patient   Albertine Patricia M.D on 04/08/2018 at 10:25 AM  Thank you for the consult, we will follow the patient with you in the Hospital.

## 2018-04-08 NOTE — Progress Notes (Signed)
Physical Therapy Treatment Patient Details Name: Mandy Jones MRN: [id number] DOB: July 26, 1951 Today's Date: 04/08/2018    History of Present Illness Pt presents to hospital on 04/06/18 for an elective R achilles reconstruction after conservitive treatment of achillies tendinopathy has failed. Pt has a past medical history that includes GERD, PVD, hep B and sleep apnea    PT Comments    Patient demonstrated progress today with STS transfer and ambulation with RW while maintaining NWB status.  She was able to perform bed mobility with use of bed rail and sit at EOB without assistance for balance.  Pt attempted STS with RW and was able to stand but appeared unstable.  PT has pt to sit back down and PT adjusted RW to a lower level.  After practicing shoulder depression on EOB, pt was able to stand and better use RW to support herself.  Pt required very close CGA but was able to manage 4-5 very small hops forward before turning to back to chair.  PT provided frequent VC's and encouragement.  Pt able to slowly lower to chair with R LE clearing floor, appearing very fatigued following.  PT reviewed seated there ex with pt and issued an HEP, providing education regarding importance of upper body and core strength in use of RW.  Pt was able to complete all exercises with manual cues and VC's for body mechanics and modifications to maintain NWB of R heel.  Pt will continue to benefit from skilled PT with focus on transfers, pain management, safe management of AD and strength.  Discharge recommendation remains to be SNF due to pt's very limited mobility and difficulty in maintaining WB status.  Follow Up Recommendations  SNF     Equipment Recommendations  None recommended by PT    Recommendations for Other Services       Precautions / Restrictions Precautions Precautions: Fall Restrictions Weight Bearing Restrictions: Yes RLE Weight Bearing: Non weight bearing    Mobility  Bed  Mobility Overal bed mobility: Modified Independent Bed Mobility: Supine to Sit     Supine to sit: Modified independent (Device/Increase time)     General bed mobility comments: Able to get to EOB slowly with use of bed rail.  Transfers Overall transfer level: Needs assistance Equipment used: Rolling walker (2 wheeled) Transfers: Sit to/from Stand Sit to Stand: Min assist Stand pivot transfers: Min assist       General transfer comment: PT lowered RW one notch after first STS attempt and directed pt in practicing shoulder depression before performing transfer.  Pt was able to demonstrate ability while sitting at EOB and standing with RW, R foot clearing floor.    Ambulation/Gait Ambulation/Gait assistance: Min guard Gait Distance (Feet): 3 Feet Assistive device: Rolling walker (2 wheeled)     Gait velocity interpretation: <1.31 ft/sec, indicative of household ambulator General Gait Details: PT provided very close CGA as pt was able to advance forward 4-5 steps with L LE and proper use of RW before turning to back to chair.  Pt able to maintain NWB status and clear florr minimally with L LE.   Stairs             Wheelchair Mobility    Modified Rankin (Stroke Patients Only)       Balance Overall balance assessment: Needs assistance   Sitting balance-Leahy Scale: Good Sitting balance - Comments: able to sit without UE support and no LOB     Standing balance-Leahy Scale: Poor Standing balance comment:  Requires RW for balance on L LE.                            Cognition                                              Exercises General Exercises - Lower Extremity Quad Sets: 10 reps;Both;Seated(Instructed pt to include bilateral LE's in ther ex for improved mobility with RW.) Hip ABduction/ADduction: Strengthening;Right;10 reps;Seated(Pillow squeeze for adduction, abduction also performed.) Hip Flexion/Marching: Seated;Both;20  reps;Strengthening(VC's to sit upright and to add leanback for core engagement.) Other Exercises Other Exercises: Seated shoulder depression B UE at edge of chair x10 Other Exercises: Seated leanbacks with trunk rotation x10 Other Exercises: Issued HEp with seated exercises and shoulder depression.  Education provided concerning importance of UE and core strength with RW use.    General Comments        Pertinent Vitals/Pain      Home Living                      Prior Function            PT Goals (current goals can now be found in the care plan section) Acute Rehab PT Goals Patient Stated Goal: to go home PT Goal Formulation: With patient Time For Goal Achievement: 04/20/18 Potential to Achieve Goals: Good Progress towards PT goals: Progressing toward goals    Frequency    BID      PT Plan Current plan remains appropriate    Co-evaluation              AM-PAC PT "6 Clicks" Daily Activity  Outcome Measure  Difficulty turning over in bed (including adjusting bedclothes, sheets and blankets)?: A Little Difficulty moving from lying on back to sitting on the side of the bed? : A Little Difficulty sitting down on and standing up from a chair with arms (e.g., wheelchair, bedside commode, etc,.)?: A Lot Help needed moving to and from a bed to chair (including a wheelchair)?: A Lot Help needed walking in hospital room?: A Lot Help needed climbing 3-5 steps with a railing? : Total 6 Click Score: 13    End of Session Equipment Utilized During Treatment: Gait belt Activity Tolerance: Patient tolerated treatment well Patient left: in chair;with chair alarm set;with call bell/phone within reach   PT Visit Diagnosis: Unsteadiness on feet (R26.81);Muscle weakness (generalized) (M62.81);Pain Pain - Right/Left: Right Pain - part of body: Ankle and joints of foot     Time: 0277-4128 PT Time Calculation (min) (ACUTE ONLY): 26 min  Charges:  $Therapeutic  Exercise: 8-22 mins $Therapeutic Activity: 8-22 mins                    Roxanne Gates, PT, DPT    Roxanne Gates 04/08/2018, 3:14 PM

## 2018-04-09 ENCOUNTER — Inpatient Hospital Stay: Payer: Medicare HMO

## 2018-04-09 LAB — SURGICAL PATHOLOGY

## 2018-04-09 NOTE — Plan of Care (Signed)
  Problem: Health Behavior/Discharge Planning: Goal: Ability to manage health-related needs will improve Outcome: Progressing   Problem: Clinical Measurements: Goal: Ability to maintain clinical measurements within normal limits will improve Outcome: Progressing Goal: Will remain free from infection Outcome: Progressing Goal: Cardiovascular complication will be avoided Outcome: Progressing   Problem: Activity: Goal: Risk for activity intolerance will decrease Outcome: Progressing   Problem: Elimination: Goal: Will not experience complications related to bowel motility Outcome: Progressing   Problem: Pain Managment: Goal: General experience of comfort will improve Outcome: Progressing   Problem: Safety: Goal: Ability to remain free from injury will improve Outcome: Progressing   Problem: Skin Integrity: Goal: Risk for impaired skin integrity will decrease Outcome: Progressing

## 2018-04-09 NOTE — Progress Notes (Signed)
[facility] Podiatry                                                      Patient Demographics  Mandy Jones, is a 67 y.o. female   MRN: [id number]   DOB - [birth year]  Admit Date - 04/06/2018    Outpatient Primary MD for the patient is [name], Melba Coon, MD  Consult requested in the Hospital by Saundra Shelling, MD, On 04/09/2018  With History of -  Past Medical History:  Diagnosis Date  . Arthritis   . Depression   . Dyspnea   . Dysrhythmia   . Fibromyalgia 1992  . GERD (gastroesophageal reflux disease)   . Hepatitis    Hepatitis B 1985  . Mitral valve prolapse   . Sleep apnea    CPAP  . Tendon tear, ankle    rt foot  . Thyroid dysfunction       Past Surgical History:  Procedure Laterality Date  . CARDIOVERSION N/A 02/06/2018   Procedure: CARDIOVERSION;  Surgeon: Corey Skains, MD;  Location: [facility] ORS;  Service: Cardiovascular;  Laterality: N/A;  . CARDIOVERSION N/A 03/28/2018   Procedure: CARDIOVERSION;  Surgeon: Corey Skains, MD;  Location: [facility] ORS;  Service: Cardiovascular;  Laterality: N/A;  . CHOLECYSTECTOMY    . CONTINUOUS NERVE MONITORING N/A 08/03/2017   Procedure: LARYNGEAL NERVE MONITORING;  Surgeon: Carloyn Manner, MD;  Location: [facility] ORS;  Service: ENT;  Laterality: N/A;  . EYE SURGERY Bilateral   . FOOT SURGERY Left 2000  . JOINT REPLACEMENT     left knee  . SHOULDER ARTHROSCOPY Bilateral   . SHOULDER ARTHROSCOPY Left   . THYROIDECTOMY N/A 08/03/2017   Procedure: THYROIDECTOMY;  Surgeon: Carloyn Manner, MD;  Location: [facility] ORS;  Service: ENT;  Laterality: N/A;  . TONSILLECTOMY    . TUBAL LIGATION      in for   No chief complaint on file.    HPI  Mandy Jones  is a 67 y.o. female, 3 days status post Achilles repair right with stable progression.  Well controlled with pain meds.    Review of Systems    In  addition to the HPI above,  No Fever-chills, No Headache, No changes with Vision or hearing, No problems swallowing food or Liquids, No Chest pain, Cough or Shortness of Breath, No Abdominal pain, No Nausea or Vommitting, Bowel movements are regular, No Blood in stool or Urine, No dysuria, No new skin rashes or bruises, Pain in both calves and squeezing today.,  No new weakness, tingling, numbness in any extremity, No recent weight gain or loss, No polyuria, polydypsia or polyphagia, No significant Mental Stressors.  A full 10 point Review of Systems was done, except as stated above, all other Review of Systems were negative.   Social History Social History   Tobacco Use  . Smoking status: Former Research scientist (life sciences)  . Smokeless tobacco: Never Used  . Tobacco comment: quit 30 years ago  Substance Use Topics  . Alcohol use: Yes    Comment: occassional    Family History History reviewed. No pertinent family history.  Prior to Admission medications   Medication Sig Start Date End Date Taking? Authorizing Provider  amiodarone (PACERONE) 400 MG tablet Take 400 mg by mouth daily.   Yes [provider]  apixaban (ELIQUIS) 5 MG  TABS tablet Take 1 tablet (5 mg total) by mouth 2 (two) times daily. 12/15/17  Yes Gouru, Illene Silver, MD  atorvastatin (LIPITOR) 10 MG tablet Take 10 mg by mouth daily. 05/05/17  Yes [provider]  azelastine (ASTELIN) 0.1 % nasal spray Place 1 spray into both nostrils daily as needed for rhinitis. Use in each nostril as directed   Yes [provider]  buPROPion (WELLBUTRIN XL) 300 MG 24 hr tablet Take 300 mg by mouth daily.  05/10/17  Yes [provider]  Calcium Carbonate-Vitamin D (CALCIUM 600+D PO) Take 1 tablet by mouth daily.   Yes [provider]  cycloSPORINE (RESTASIS) 0.05 % ophthalmic emulsion Place 1 drop into both eyes daily.    Yes [provider]  furosemide (LASIX) 20 MG tablet Take 20 mg by mouth daily.  05/19/17  Yes [provider]  Javier Docker Oil 500 MG CAPS Take 500 mg by mouth daily.   Yes [provider]  levothyroxine (SYNTHROID, LEVOTHROID) 200 MCG tablet Take 1 tablet (200 mcg total) by mouth daily before breakfast. 12/16/17  Yes Gouru, Aruna, MD  Melatonin 5 MG TABS Take 5 mg by mouth at bedtime as needed (sleep).    Yes [provider]  metoprolol tartrate (LOPRESSOR) 25 MG tablet Take 1 tablet (25 mg total) by mouth 2 (two) times daily. 12/15/17  Yes Gouru, Illene Silver, MD  multivitamin-lutein (OCUVITE-LUTEIN) CAPS capsule Take 2 capsules by mouth daily.    Yes [provider]  ranitidine (ZANTAC) 150 MG tablet Take 300 mg by mouth at bedtime.  10/05/16  Yes [provider]  traZODone (DESYREL) 50 MG tablet Take 50-150 mg by mouth at bedtime.  12/04/17  Yes [provider]  oxyCODONE-acetaminophen (PERCOCET) 5-325 MG tablet Take 1-2 tablets by mouth every 6 (six) hours as needed for severe pain. 04/06/18 04/06/19  [name], DPM    Anti-infectives (From admission, onward)   Start     Dose/Rate Route Frequency Ordered Stop   04/06/18 0605  ceFAZolin (ANCEF) 2-4 GM/100ML-% IVPB    Note to Pharmacy:  Norton Blizzard  : cabinet override      04/06/18 0605 04/06/18 0742   04/06/18 0600  ceFAZolin (ANCEF) IVPB 2g/100 mL premix     2 g 200 mL/hr over 30 Minutes Intravenous On call to O.R. 04/05/18 2139 04/06/18 6226      Scheduled Meds: . amiodarone  400 mg Oral Daily  . apixaban  5 mg Oral BID  . atorvastatin  10 mg Oral Daily  . buPROPion  300 mg Oral Daily  . calcium-vitamin D  1 tablet Oral Daily  . cycloSPORINE  1 drop Both Eyes Daily  . famotidine  20 mg Oral QHS  . furosemide  20 mg Oral Daily  . levothyroxine  200 mcg Oral QAC breakfast  . metoprolol tartrate  25 mg Oral BID  . multivitamin-lutein  2 capsule Oral Daily  . traZODone  50-150 mg Oral QHS   Continuous Infusions: PRN Meds:.azelastine, Melatonin, morphine injection,  oxyCODONE-acetaminophen  No Known Allergies  Physical Exam  Vitals  Blood pressure (!) 137/93, pulse 65, temperature 98.2 F (36.8 C), temperature source Oral, resp. rate 18, height 5\' 2"  (1.575 m), weight 100.3 kg, SpO2 97 %.  Lower Extremity exam: Bandages removed today the incision margins and stable.  I redressed the area today we will keep her in a boot for the time being.  Does have pain with compression of both Going to Order a  Venous Ultrasound to Make Sure She Has Everything Going on.  Data Review  CBC Recent Labs  Lab 04/08/18 0658  WBC 6.2  HGB 12.8  HCT 37.7  PLT 182  MCV 87.5  MCH 29.7  MCHC 33.9  RDW 15.2*   ------------------------------------------------------------------------------------------------------------------  Chemistries  Recent Labs  Lab 04/08/18 0658  CREATININE 0.63   ------------------------------------------------------------------------------------------------------------------ estimated creatinine clearance is 73.6 mL/min (by C-G formula based on SCr of 0.63 mg/dL). ------------------------------------------------------------------------------------------------------------------ No results for input(s): TSH, T4TOTAL, T3FREE, THYROIDAB in the last 72 hours.  Invalid input(s): FREET3 Urinalysis No results found for: COLORURINE, APPEARANCEUR, LABSPEC, PHURINE, GLUCOSEU, HGBUR, BILIRUBINUR, KETONESUR, PROTEINUR, UROBILINOGEN, NITRITE, LEUKOCYTESUR   Imaging results:   No results found.  Assessment & Plan: Go to get ultrasounds today but I think that will probably be negative.  If they are negative we can go ahead and send her to rehab.  Should be scheduled see Dr. Sharlotte Alamo later this week but since I have seen her and change her dressing I would recommend she change that to see [name] next week.  Still needs to remain completely nonweightbearing on the foot.  Is also on Eliquis which I think will prevent any type of peripheral  clotting formation but need to rule that out.  Active Problems:   Post-operative pain   Post-op pain   Family Communication: Plan discussed with patient   Albertine Patricia M.D on 04/09/2018 at 8:13 AM  Thank you for the consult, we will follow the patient with you in the Hospital.

## 2018-04-09 NOTE — Progress Notes (Signed)
PT Cancellation Note  Patient Details Name: Mandy Jones MRN: [id number] DOB: 1951-05-25   Cancelled Treatment:    Reason Eval/Treat Not Completed: Patient at procedure or test/unavailable;Other (comment)(Patient out of room for b/l venous US. PT will follow up as able. )  Lieutenant Diego PT, DPT [phone] AM,04/09/18  [phone]

## 2018-04-09 NOTE — Anesthesia Postprocedure Evaluation (Signed)
Anesthesia Post Note  Patient: Mandy Jones  Procedure(s) Performed: ACHILLES TENDON REPAIR (Right Foot) OSTECTOMY-HAGLUNDS/RECTROCALCANEAL (Right Foot)  Patient location during evaluation: PACU Anesthesia Type: General Level of consciousness: awake and alert and oriented Pain management: pain level controlled Vital Signs Assessment: post-procedure vital signs reviewed and stable Respiratory status: spontaneous breathing Cardiovascular status: blood pressure returned to baseline Anesthetic complications: no     Last Vitals:  Vitals:   04/08/18 2253 04/09/18 0748  BP: 129/65 (!) 137/93  Pulse: 73 65  Resp:  18  Temp: 36.9 C 36.8 C  SpO2: 96% 97%    Last Pain:  Vitals:   04/09/18 0934  TempSrc:   PainSc: 2                  Makenli Derstine

## 2018-04-09 NOTE — Progress Notes (Signed)
[facility] [place] NAME: Mandy Jones    MR#:  [id number]  DATE OF BIRTH:  02-13-1951  SUBJECTIVE:  Patient seen and evaluated today Has right lower extremity brace No chest pain, shortness of breath Patient has pain in the calves in both legs  REVIEW OF SYSTEMS:    ROS  CONSTITUTIONAL: No documented fever. No fatigue, weakness. No weight gain, no weight loss.  EYES: No blurry or double vision.  ENT: No tinnitus. No postnasal drip. No redness of the oropharynx.  RESPIRATORY: No cough, no wheeze, no hemoptysis. No dyspnea.  CARDIOVASCULAR: No chest pain. No orthopnea. No palpitations. No syncope.  GASTROINTESTINAL: No nausea, no vomiting or diarrhea. No abdominal pain. No melena or hematochezia.  GENITOURINARY: No dysuria or hematuria.  ENDOCRINE: No polyuria or nocturia. No heat or cold intolerance.  HEMATOLOGY: No anemia. No bruising. No bleeding.  INTEGUMENTARY: No rashes. No lesions.  MUSCULOSKELETAL: No arthritis. No swelling. No gout.  Decreased right heel pain Pain in the calves in both lower extremities NEUROLOGIC: No numbness, tingling, or ataxia. No seizure-type activity.  PSYCHIATRIC: No anxiety. No insomnia. No ADD.   DRUG ALLERGIES:  No Known Allergies  VITALS:  Blood pressure (!) 137/93, pulse 65, temperature 98.2 F (36.8 C), temperature source Oral, resp. rate 18, height 5\' 2"  (1.575 m), weight 100.3 kg, SpO2 97 %.  PHYSICAL EXAMINATION:   Physical Exam  GENERAL:  67 y.o.-year-old patient lying in the bed with no acute distress.  EYES: Pupils equal, round, reactive to light and accommodation. No scleral icterus. Extraocular muscles intact.  HEENT: Head atraumatic, normocephalic. Oropharynx and nasopharynx clear.  NECK:  Supple, no jugular venous distention. No thyroid enlargement, no tenderness.  LUNGS: Normal breath sounds bilaterally, no wheezing, rales, rhonchi. No use of accessory muscles of respiration.   CARDIOVASCULAR: S1, S2 normal. No murmurs, rubs, or gallops.  ABDOMEN: Soft, nontender, nondistended. Bowel sounds present. No organomegaly or mass.  EXTREMITIES: No cyanosis, clubbing or edema b/l.    Right lower extremity brace No swelling or redness in the calves NEUROLOGIC: Cranial nerves II through XII are intact. No focal Motor or sensory deficits b/l.   PSYCHIATRIC: The patient is alert and oriented x 3.  SKIN: No obvious rash, lesion, or ulcer.   LABORATORY PANEL:   CBC Recent Labs  Lab 04/08/18 0658  WBC 6.2  HGB 12.8  HCT 37.7  PLT 182   ------------------------------------------------------------------------------------------------------------------ Chemistries  Recent Labs  Lab 04/08/18 0658  CREATININE 0.63   ------------------------------------------------------------------------------------------------------------------  Cardiac Enzymes No results for input(s): TROPONINI in the last 168 hours. ------------------------------------------------------------------------------------------------------------------  RADIOLOGY:  No results found.   ASSESSMENT AND PLAN:   67 year old female patient under podiatric service with history of sleep apnea, fibromyalgia, GERD, arthritis status post Achilles tendon repair  -Paroxysmal atrial fibrillation Status post cardioversion x2 in the past Currently on anticoagulation with apixaban Continue beta-blocker for rate control  -Status post right Achilles tendon repair Right lower extremity nonweightbearing Appreciate physical therapy evaluation and podiatry evaluation SNF placement once insurance authorization has been approved  social worker follow-up for placement  -Lower extremity calf tenderness Venous Doppler ultrasound today to rule out DVT  -Hypothyroidism Continue Synthroid  -Hyperlipidemia Continue simvastatin  -DVT prophylaxis On anticoagulation with apixaban  -s/p PT evaluation SNF placement  once insurance authorization is approved  All the records are reviewed and case discussed with Care Management/Social Worker. Management plans discussed with the patient, family and they are in agreement.  CODE STATUS: Full code  DVT Prophylaxis: SCDs  TOTAL TIME TAKING CARE OF THIS PATIENT: 23 minutes.   POSSIBLE D/C IN 1 to 2 DAYS, DEPENDING ON CLINICAL CONDITION.  Saundra Shelling M.D on 04/09/2018 at 10:52 AM  Between 7am to 6pm - Pager - [phone]  After 6pm go to [email or web address] - password [email or web address] [facility]  Office  [phone]  CC: Primary care physician; Elaina Pattee, MD  Note: This dictation was prepared with Dragon dictation along with smaller phrase technology. Any transcriptional errors that result from this process are unintentional.

## 2018-04-09 NOTE — Progress Notes (Signed)
Physical Therapy Treatment Patient Details Name: Mandy Jones MRN: [id number] DOB: 08/22/1951 Today's Date: 04/09/2018    History of Present Illness Pt presents to hospital on 04/06/18 for an elective R achilles reconstruction after conservitive treatment of achillies tendinopathy has failed. Pt has a past medical history that includes GERD, PVD, hep B and sleep apnea. [place] negative for DVT.     PT Comments    Patient alert and agreeable to PT at start of session. Patient demonstrated good knowledge of exercise program, most difficulty noted with chair pushups to increase UE strength to aid in NWB on RLE. Patient demonstrated bed mobility mod I, sit<>stand transfers with CGA from elevated surface. Patient able to ambulate ~40ft to chair within NWB precautions utilizing hop/pivot on LLE. Minimal to no full foot clearance during hop. Patient fatigued and mildly SOB at end of mobility. The patient would benefit from further skilled PT to continue to progress towards goals.     Follow Up Recommendations  SNF     Equipment Recommendations  None recommended by PT    Recommendations for Other Services       Precautions / Restrictions Precautions Precautions: Fall Required Braces or Orthoses: Other Brace/Splint Other Brace/Splint: boot on R ankle/foot Restrictions Weight Bearing Restrictions: Yes RLE Weight Bearing: Non weight bearing    Mobility  Bed Mobility Overal bed mobility: Modified Independent Bed Mobility: Supine to Sit           General bed mobility comments: use of bed rail  Transfers Overall transfer level: Needs assistance Equipment used: Standard walker Transfers: Sit to/from Stand Sit to Stand: Min guard            Ambulation/Gait Ambulation/Gait assistance: Min guard Gait Distance (Feet): 5 Feet Assistive device: Standard walker       General Gait Details: Minimal clearance of L foot for hop, pivot/shuffle of foot utilized as well. NWB on RLE  properly   Stairs             Wheelchair Mobility    Modified Rankin (Stroke Patients Only)       Balance   Sitting-balance support: Feet supported Sitting balance-Leahy Scale: Good Sitting balance - Comments: NWB on RLE adherence     Standing balance-Leahy Scale: Poor                              Cognition Arousal/Alertness: Awake/alert Behavior During Therapy: WFL for tasks assessed/performed Overall Cognitive Status: Within Functional Limits for tasks assessed                                        Exercises General Exercises - Lower Extremity Quad Sets: AROM;Both;20 reps;Supine Long Arc Quad: AROM;Strengthening;Both;20 reps Hip ABduction/ADduction: AROM;Seated;Strengthening;Right;20 reps Straight Leg Raises: AROM;Strengthening;20 reps;Both Hip Flexion/Marching: AROM;Strengthening;Right;20 reps Other Exercises Other Exercises: seated chair pushups x15, rest break at 10 Other Exercises: mini crunches in chair slightly reclined x 20    General Comments        Pertinent Vitals/Pain Pain Assessment: 0-10 Pain Score: 6  Pain Location: R ankle Pain Descriptors / Indicators: Aching Pain Intervention(s): Limited activity within patient's tolerance;Monitored during session;Repositioned    Home Living                      Prior Function  PT Goals (current goals can now be found in the care plan section) Progress towards PT goals: Progressing toward goals    Frequency    BID      PT Plan Current plan remains appropriate    Co-evaluation              AM-PAC PT "6 Clicks" Daily Activity  Outcome Measure  Difficulty turning over in bed (including adjusting bedclothes, sheets and blankets)?: A Little Difficulty moving from lying on back to sitting on the side of the bed? : A Little Difficulty sitting down on and standing up from a chair with arms (e.g., wheelchair, bedside commode, etc,.)?: A  Lot Help needed moving to and from a bed to chair (including a wheelchair)?: A Lot Help needed walking in hospital room?: A Lot Help needed climbing 3-5 steps with a railing? : Total 6 Click Score: 13    End of Session Equipment Utilized During Treatment: Gait belt Activity Tolerance: Patient tolerated treatment well Patient left: in chair;with chair alarm set;with call bell/phone within reach(RLE elevated) Nurse Communication: Mobility status PT Visit Diagnosis: Unsteadiness on feet (R26.81);Muscle weakness (generalized) (M62.81);Pain Pain - Right/Left: Right Pain - part of body: Ankle and joints of foot     Time: 1400-1419 PT Time Calculation (min) (ACUTE ONLY): 19 min  Charges:  $Therapeutic Exercise: 8-22 mins                    Lieutenant Diego PT, DPT 2:33 PM,04/09/18 [phone]

## 2018-04-09 NOTE — Progress Notes (Signed)
Per Tammy admissions coordinator at [facility] authorization is pending. CSW sent PT notes from over the weekend to [facility].   [employer], LCSW [phone]

## 2018-04-10 ENCOUNTER — Encounter: Payer: Self-pay | Admitting: Podiatry

## 2018-04-10 MED ORDER — ACETAMINOPHEN 325 MG PO TABS
650.0000 mg | ORAL_TABLET | Freq: Four times a day (QID) | ORAL | Status: DC | PRN
  Filled 2018-04-10: qty 2

## 2018-04-10 MED ORDER — OXYCODONE-ACETAMINOPHEN 5-325 MG PO TABS: 1.0000 | ORAL_TABLET | Freq: Four times a day (QID) | ORAL | 0 refills | Status: AC | PRN

## 2018-04-10 NOTE — Progress Notes (Signed)
Patient is medically stable for D/C to [facility] today. Per Otila Kluver [facility] liaison [facility] authorization has been received and patient can come today to room 710. RN will call report and arrange EMS for transport. Clinical Education officer, museum (CSW) sent D/C orders to [facility] via [facility]. Patient is aware of above. Per patient her friend will bring her cpap from home to [facility] tonight. CSW contacted patient's friend/neighbor [name] and made her aware of above. Please reconsult if future social work needs arise. CSW signing off.   [employer], LCSW [phone]

## 2018-04-10 NOTE — Progress Notes (Addendum)
Physical Therapy Treatment Patient Details Name: Mandy Jones MRN: [id number] DOB: 07/07/1951 Today's Date: 04/10/2018    History of Present Illness Pt presents to hospital on 04/06/18 for an elective R achilles reconstruction after conservitive treatment of achillies tendinopathy has failed. Pt has a past medical history that includes GERD, PVD, hep B and sleep apnea. [place] negative for DVT.     PT Comments    Pt agreeable to PT; reports 4/10 pain R foot/ankle. Pt demonstrating improved mobility ambulating up to 15 ft. Ambulation slow requiring adjustment of walker for improved use of UEs to maintain NWB on R. Instruction/cues given to adhere to NWB; pt does well with R knee flexed and lightly resting toes on ground between steps. Pt demonstrating improved transfers bed to/from [facility] as well. Min guard throughout functional mobility this session. Pt to discharge to skilled nursing facility to continue rehab efforts prior to returning home.   Follow Up Recommendations  SNF     Equipment Recommendations  None recommended by PT    Recommendations for Other Services       Precautions / Restrictions Precautions Precautions: Fall Restrictions Weight Bearing Restrictions: Yes RLE Weight Bearing: Non weight bearing    Mobility  Bed Mobility Overal bed mobility: Modified Independent Bed Mobility: Supine to Sit     Supine to sit: Modified independent (Device/Increase time)     General bed mobility comments: use of rail  Transfers Overall transfer level: Needs assistance Equipment used: Standard walker Transfers: Sit to/from Stand Sit to Stand: Min guard         General transfer comment: from bed, BSC and chair  Ambulation/Gait Ambulation/Gait assistance: Min guard Gait Distance (Feet): 15 Feet Assistive device: Standard walker Gait Pattern/deviations: Step-to pattern     General Gait Details: Rw lowered for improved use of UEs. Cues/instruction for maintaining NWB.  Pt able to hold in knee flexed position with need to rest toes down between steps. Effortful with rest breaks required. Small hops with minimal clearance, but improved distance.    Stairs             Wheelchair Mobility    Modified Rankin (Stroke Patients Only)       Balance Overall balance assessment: Needs assistance Sitting-balance support: Feet supported Sitting balance-Leahy Scale: Good     Standing balance support: Bilateral upper extremity supported Standing balance-Leahy Scale: Fair                              Cognition Arousal/Alertness: Awake/alert Behavior During Therapy: WFL for tasks assessed/performed Overall Cognitive Status: Within Functional Limits for tasks assessed                                        Exercises General Exercises - Lower Extremity Quad Sets: Strengthening;Both;15 reps Long Arc Quad: AROM;Both;15 reps;Supine Hip Flexion/Marching: AROM;Both;Strengthening;15 reps Other Exercises Other Exercises: BSC transfers     General Comments        Pertinent Vitals/Pain Pain Assessment: 0-10 Pain Score: 4  Pain Location: R ankle Pain Intervention(s): Premedicated before session;Monitored during session;Repositioned    Home Living                      Prior Function            PT Goals (current goals can now be found  in the care plan section) Progress towards PT goals: Progressing toward goals    Frequency    BID      PT Plan Current plan remains appropriate    Co-evaluation              AM-PAC PT "6 Clicks" Daily Activity  Outcome Measure  Difficulty turning over in bed (including adjusting bedclothes, sheets and blankets)?: A Little Difficulty moving from lying on back to sitting on the side of the bed? : A Little Difficulty sitting down on and standing up from a chair with arms (e.g., wheelchair, bedside commode, etc,.)?: Unable Help needed moving to and from a bed to  chair (including a wheelchair)?: A Little Help needed walking in hospital room?: A Little Help needed climbing 3-5 steps with a railing? : Total 6 Click Score: 14    End of Session Equipment Utilized During Treatment: Gait belt Activity Tolerance: Patient tolerated treatment well Patient left: in chair;with call bell/phone within reach;with chair alarm set   PT Visit Diagnosis: Unsteadiness on feet (R26.81);Muscle weakness (generalized) (M62.81);Pain Pain - Right/Left: Right Pain - part of body: Ankle and joints of foot     Time: 1007-1045 PT Time Calculation (min) (ACUTE ONLY): 38 min  Charges:  $Gait Training: 8-22 mins $Therapeutic Exercise: 8-22 mins $Therapeutic Activity: 8-22 mins                      Larae Grooms, PTA 04/10/2018, 12:09 PM

## 2018-04-10 NOTE — Progress Notes (Addendum)
Patient is being discharged to [facility] via EMS. No IV site. Scripts and discharge papers sent with patient. Patient's own walker sent with EMS. Report called.

## 2018-04-10 NOTE — Clinical Social Work Placement (Signed)
   CLINICAL SOCIAL WORK PLACEMENT  NOTE  Date:  04/10/2018  Patient Details  Name: Mandy Jones MRN: [id number] Date of Birth: 28-Aug-1951  Clinical Social Work is seeking post-discharge placement for this patient at the [place] level of care (*CSW will initial, date and re-position this form in  chart as items are completed):  Yes   Patient/family provided with [facility] Work Department's list of facilities offering this level of care within the geographic area requested by the patient (or if unable, by the patient's family).  Yes   Patient/family informed of their freedom to choose among providers that offer the needed level of care, that participate in Medicare, Medicaid or managed care program needed by the patient, have an available bed and are willing to accept the patient.  Yes   Patient/family informed of [facility]'s ownership interest in [facility] and [facility], as well as of the fact that they are under no obligation to receive care at these facilities.  PASRR submitted to EDS on       PASRR number received on       Existing PASRR number confirmed on 04/06/18     FL2 transmitted to all facilities in geographic area requested by pt/family on 04/06/18     FL2 transmitted to all facilities within larger geographic area on       Patient informed that his/her managed care company has contracts with or will negotiate with certain facilities, including the following:        Yes   Patient/family informed of bed offers received.  Patient chooses bed at ([facility] )     Physician recommends and patient chooses bed at      Patient to be transferred to ([facility] ) on 04/10/18.  Patient [facility] be transferred to facility by [facility] )     Patient family notified on 04/10/18 of transfer.  Name of family member notified:  (Patient's friend/neighbor Mandy Jones is aware of D/C today.  )     PHYSICIAN       Additional Comment:      _______________________________________________ Shrey Boike, Veronia Beets, LCSW 04/10/2018, 1:42 PM

## 2018-04-10 NOTE — Discharge Summary (Signed)
[facility] [place] NAME: Mandy Jones    MR#:  [id number]  DATE OF BIRTH:  1951/03/28  DATE OF ADMISSION:  04/06/2018 ADMITTING PHYSICIAN: [name], DPM  DATE OF DISCHARGE: 04/10/2018  PRIMARY CARE PHYSICIAN: Elaina Pattee, MD   ADMISSION DIAGNOSIS:  Achilles Tendinitis-Right Exostosis-Right Paroxysmal atrial fibrillation Hypothyroidism Hyperlipidemia DISCHARGE DIAGNOSIS:  Status post Achilles tendon repair Hypothyroidism Hyperlipidemia Paroxysmal atrial fibrillation Postoperative pain  SECONDARY DIAGNOSIS:   Past Medical History:  Diagnosis Date  . Arthritis   . Depression   . Dyspnea   . Dysrhythmia   . Fibromyalgia 1992  . GERD (gastroesophageal reflux disease)   . Hepatitis    Hepatitis B 1985  . Mitral valve prolapse   . Sleep apnea    CPAP  . Tendon tear, ankle    rt foot  . Thyroid dysfunction      ADMITTING HISTORY  Pt underwent right achilles tendon repair and calcaneal exostectomy.  PT asked to see and assist with crutch training for NWB status.  Upon evaluation found not to be able to maintain NWB status and felt pt is not safe to attempt to return home.  Will plan to admit pt for IV pain management and further PT evaluation and treatment.  May need SNF/Rehab placment if not able to safely return to home.     HOSPITAL COURSE:  Patient was admitted to medical floor.  Patient has a brace in the splint to right lower extremity.  She was nonweightbearing for the right lower extremity.  She was followed up with physical therapy evaluation and podiatry also followed the patient in the hospital.  Patient had some pain in both the calves she was worked up with venous Doppler ultrasound of lower extremity which showed no DVT.  Social worker consultation was done for rehab placement upon physical therapy recommendations.  Patient lives alone and unable to take care of herself at home and she is also  nonweightbearing on right lower extremity.  Hemoglobin has been stable during hospitalization.  [facility] and will be discharged today.  DVT prophylaxis with apixaban will continue.  CONSULTS OBTAINED:  Treatment Team:  Gorden Harms, MD  DRUG ALLERGIES:  No Known Allergies  DISCHARGE MEDICATIONS:   Allergies as of 04/10/2018   No Known Allergies     Medication List    TAKE these medications   amiodarone 400 MG tablet Commonly known as:  PACERONE Take 400 mg by mouth daily.   apixaban 5 MG Tabs tablet Commonly known as:  ELIQUIS Take 1 tablet (5 mg total) by mouth 2 (two) times daily.   atorvastatin 10 MG tablet Commonly known as:  LIPITOR Take 10 mg by mouth daily.   azelastine 0.1 % nasal spray Commonly known as:  ASTELIN Place 1 spray into both nostrils daily as needed for rhinitis. Use in each nostril as directed   buPROPion 300 MG 24 hr tablet Commonly known as:  WELLBUTRIN XL Take 300 mg by mouth daily.   CALCIUM 600+D PO Take 1 tablet by mouth daily.   cycloSPORINE 0.05 % ophthalmic emulsion Commonly known as:  RESTASIS Place 1 drop into both eyes daily.   furosemide 20 MG tablet Commonly known as:  LASIX Take 20 mg by mouth daily.   Krill Oil 500 MG Caps Take 500 mg by mouth daily.   levothyroxine 200 MCG tablet Commonly known as:  SYNTHROID, LEVOTHROID Take 1  tablet (200 mcg total) by mouth daily before breakfast.   Melatonin 5 MG Tabs Take 5 mg by mouth at bedtime as needed (sleep).   metoprolol tartrate 25 MG tablet Commonly known as:  LOPRESSOR Take 1 tablet (25 mg total) by mouth 2 (two) times daily.   multivitamin-lutein Caps capsule Take 2 capsules by mouth daily.   oxyCODONE-acetaminophen 5-325 MG tablet Commonly known as:  PERCOCET/ROXICET Take 1-2 tablets by mouth every 6 (six) hours as needed for severe pain.   ranitidine 150 MG tablet Commonly known as:  ZANTAC Take 300 mg by mouth at  bedtime.   traZODone 50 MG tablet Commonly known as:  DESYREL Take 50-150 mg by mouth at bedtime.            Discharge Care Instructions  (From admission, onward)         Start     Ordered   04/09/18 0000  Non weight bearing    Question:  Laterality  Answer:  right   04/09/18 1011          Today  Patient seen and evaluated today Nonweightbearing right lower extremity No pain in the calves Tolerating diet well VITAL SIGNS:  Blood pressure 105/68, pulse (!) 49, temperature 98 F (36.7 C), temperature source Oral, resp. rate 20, height 5\' 2"  (1.575 m), weight 100.3 kg, SpO2 91 %.  I/O:    Intake/Output Summary (Last 24 hours) at 04/10/2018 1034 Last data filed at 04/10/2018 0900 Gross per 24 hour  Intake 840 ml  Output -  Net 840 ml    PHYSICAL EXAMINATION:  Physical Exam  GENERAL:  67 y.o.-year-old patient lying in the bed with no acute distress.  LUNGS: Normal breath sounds bilaterally, no wheezing, rales,rhonchi or crepitation. No use of accessory muscles of respiration.  CARDIOVASCULAR: S1, S2 normal. No murmurs, rubs, or gallops.  ABDOMEN: Soft, non-tender, non-distended. Bowel sounds present. No organomegaly or mass.  NEUROLOGIC: Moves all 4 extremities. PSYCHIATRIC: The patient is alert and oriented x 3.  SKIN: No obvious rash, lesion, or ulcer.   DATA REVIEW:   CBC Recent Labs  Lab 04/08/18 0658  WBC 6.2  HGB 12.8  HCT 37.7  PLT 182    Chemistries  Recent Labs  Lab 04/08/18 0658  CREATININE 0.63    Cardiac Enzymes No results for input(s): TROPONINI in the last 168 hours.  Microbiology Results  No results found for this or any previous visit.  RADIOLOGY:  US Venous Img Lower Bilateral  Result Date: 04/09/2018 CLINICAL DATA:  Bilateral lower extremity pain for the past 3 days. Evaluate for DVT. EXAM: BILATERAL LOWER EXTREMITY VENOUS DOPPLER ULTRASOUND TECHNIQUE: Gray-scale sonography with graded compression, as well as color Doppler  and duplex ultrasound were performed to evaluate the lower extremity deep venous systems from the level of the common femoral vein and including the common femoral, femoral, profunda femoral, popliteal and calf veins including the posterior tibial, peroneal and gastrocnemius veins when visible. The superficial great saphenous vein was also interrogated. Spectral Doppler was utilized to evaluate flow at rest and with distal augmentation maneuvers in the common femoral, femoral and popliteal veins. COMPARISON:  None. FINDINGS: RIGHT LOWER EXTREMITY Common Femoral Vein: No evidence of thrombus. Normal compressibility, respiratory phasicity and response to augmentation. Saphenofemoral Junction: No evidence of thrombus. Normal compressibility and flow on color Doppler imaging. Profunda Femoral Vein: No evidence of thrombus. Normal compressibility and flow on color Doppler imaging. Femoral Vein: No evidence of thrombus. Normal compressibility, respiratory  phasicity and response to augmentation. Popliteal Vein: No evidence of thrombus. Normal compressibility, respiratory phasicity and response to augmentation. Calf Veins: No evidence of thrombus. Normal compressibility and flow on color Doppler imaging. Superficial Great Saphenous Vein: No evidence of thrombus. Normal compressibility. Venous Reflux:  None. Other Findings:  None. LEFT LOWER EXTREMITY Common Femoral Vein: No evidence of thrombus. Normal compressibility, respiratory phasicity and response to augmentation. Saphenofemoral Junction: No evidence of thrombus. Normal compressibility and flow on color Doppler imaging. Profunda Femoral Vein: No evidence of thrombus. Normal compressibility and flow on color Doppler imaging. Femoral Vein: No evidence of thrombus. Normal compressibility, respiratory phasicity and response to augmentation. Popliteal Vein: No evidence of thrombus. Normal compressibility, respiratory phasicity and response to augmentation. Calf Veins: No  evidence of thrombus. Normal compressibility and flow on color Doppler imaging. Superficial Great Saphenous Vein: No evidence of thrombus. Normal compressibility. Venous Reflux:  None. Other Findings:  None. IMPRESSION: No evidence of DVT within either lower extremity. Electronically Signed   By: Sandi Mariscal M.D.   On: 04/09/2018 11:07    Follow up with PCP in 1 week.  Management plans discussed with the patient, family and they are in agreement.  CODE STATUS: Full code    Code Status Orders  (From admission, onward)         Start     Ordered   04/06/18 1344  Full code  Continuous     04/06/18 1343        Code Status History    Date Active Date Inactive Code Status Order ID Comments User Context   04/06/2018 1001 04/06/2018 1343 Full Code [id number]  [name], [facility] Inpatient   12/14/2017 2233 12/15/2017 1752 Full Code [id number]  Idelle Crouch, MD Inpatient   08/03/2017 1106 08/04/2017 1843 Full Code [id number]  Carloyn Manner, MD Inpatient      TOTAL TIME TAKING CARE OF THIS PATIENT ON DAY OF DISCHARGE: more than 35 minutes.   Saundra Shelling M.D on 04/10/2018 at 10:34 AM  Between 7am to 6pm - Pager - [phone]  After 6pm go to [email or web address] - password [email or web address] [facility]  Office  [phone]  CC: Primary care physician; Elaina Pattee, MD  Note: This dictation was prepared with Dragon dictation along with smaller phrase technology. Any transcriptional errors that result from this process are unintentional.

## 2018-05-09 ENCOUNTER — Encounter: Payer: Self-pay | Admitting: *Deleted

## 2018-05-09 ENCOUNTER — Ambulatory Visit: Payer: Medicare HMO | Admitting: Anesthesiology

## 2018-05-09 ENCOUNTER — Encounter
Admission: RE | Disposition: A | Discharge status: Home / Self Care | Payer: Self-pay | Source: Ambulatory Visit | Attending: Internal Medicine

## 2018-05-09 ENCOUNTER — Ambulatory Visit
Admission: RE | Admit: 2018-05-09 | Discharge: 2018-05-09 | Disposition: A | Discharge status: Home / Self Care | Payer: Medicare HMO | Source: Ambulatory Visit | Attending: Internal Medicine | Admitting: Internal Medicine

## 2018-05-09 DIAGNOSIS — Z6838 Body mass index (BMI) 38.0-38.9, adult: Secondary | ICD-10-CM | POA: Insufficient documentation

## 2018-05-09 DIAGNOSIS — G4730 Sleep apnea, unspecified: Secondary | ICD-10-CM | POA: Diagnosis not present

## 2018-05-09 DIAGNOSIS — Z96652 Presence of left artificial knee joint: Secondary | ICD-10-CM | POA: Diagnosis not present

## 2018-05-09 DIAGNOSIS — Z79890 Hormone replacement therapy (postmenopausal): Secondary | ICD-10-CM | POA: Diagnosis not present

## 2018-05-09 DIAGNOSIS — F329 Major depressive disorder, single episode, unspecified: Secondary | ICD-10-CM | POA: Diagnosis not present

## 2018-05-09 DIAGNOSIS — E039 Hypothyroidism, unspecified: Secondary | ICD-10-CM | POA: Diagnosis not present

## 2018-05-09 DIAGNOSIS — J449 Chronic obstructive pulmonary disease, unspecified: Secondary | ICD-10-CM | POA: Insufficient documentation

## 2018-05-09 DIAGNOSIS — Z79891 Long term (current) use of opiate analgesic: Secondary | ICD-10-CM | POA: Diagnosis not present

## 2018-05-09 DIAGNOSIS — K219 Gastro-esophageal reflux disease without esophagitis: Secondary | ICD-10-CM | POA: Insufficient documentation

## 2018-05-09 DIAGNOSIS — Z87891 Personal history of nicotine dependence: Secondary | ICD-10-CM | POA: Insufficient documentation

## 2018-05-09 DIAGNOSIS — Z7901 Long term (current) use of anticoagulants: Secondary | ICD-10-CM | POA: Diagnosis not present

## 2018-05-09 DIAGNOSIS — E785 Hyperlipidemia, unspecified: Secondary | ICD-10-CM | POA: Diagnosis not present

## 2018-05-09 DIAGNOSIS — Z9989 Dependence on other enabling machines and devices: Secondary | ICD-10-CM | POA: Diagnosis not present

## 2018-05-09 DIAGNOSIS — Z7951 Long term (current) use of inhaled steroids: Secondary | ICD-10-CM | POA: Diagnosis not present

## 2018-05-09 DIAGNOSIS — I2510 Atherosclerotic heart disease of native coronary artery without angina pectoris: Secondary | ICD-10-CM | POA: Diagnosis not present

## 2018-05-09 DIAGNOSIS — I480 Paroxysmal atrial fibrillation: Secondary | ICD-10-CM | POA: Insufficient documentation

## 2018-05-09 DIAGNOSIS — Z79899 Other long term (current) drug therapy: Secondary | ICD-10-CM | POA: Diagnosis not present

## 2018-05-09 HISTORY — PX: CARDIOVERSION: EP1203

## 2018-05-09 SURGERY — CARDIOVERSION (CATH LAB)
Anesthesia: General

## 2018-05-09 MED ORDER — PROPOFOL 10 MG/ML IV BOLUS
INTRAVENOUS | Status: DC | PRN
  Administered 2018-05-09: 80 mg via INTRAVENOUS

## 2018-05-09 MED ORDER — SODIUM CHLORIDE 0.9 % IV SOLN
INTRAVENOUS | Status: DC
  Administered 2018-05-09: 07:00:00 via INTRAVENOUS

## 2018-05-09 MED ORDER — PROPOFOL 10 MG/ML IV BOLUS
INTRAVENOUS | Status: AC
  Filled 2018-05-09: qty 20

## 2018-05-09 NOTE — Transfer of Care (Signed)
Immediate Anesthesia Transfer of Care Note  Patient: Mandy Jones  Procedure(s) Performed: CARDIOVERSION (N/A )  Patient Location: PACU  Anesthesia Type:General  Level of Consciousness: drowsy and patient cooperative  Airway & Oxygen Therapy: Patient Spontanous Breathing and Patient connected to nasal cannula oxygen  Post-op Assessment: Report given to RN and Post -op Vital signs reviewed and stable  Post vital signs: Reviewed and stable  Last Vitals:  Vitals Value Taken Time  BP 83/49 05/09/2018  7:52 AM  Temp    Pulse 43 05/09/2018  7:52 AM  Resp 20 05/09/2018  7:52 AM  SpO2 96 % 05/09/2018  7:52 AM  Vitals shown include unvalidated device data.  Last Pain:  Vitals:   05/09/18 0653  TempSrc: Oral  PainSc: 0-No pain         Complications: No apparent anesthesia complications

## 2018-05-09 NOTE — Anesthesia Postprocedure Evaluation (Signed)
Anesthesia Post Note  Patient: Jackqulyn Mendel [facility]  Procedure(s) Performed: CARDIOVERSION (N/A )  Patient location during evaluation: Other Anesthesia Type: General Level of consciousness: awake and alert Pain management: pain level controlled Vital Signs Assessment: post-procedure vital signs reviewed and stable Respiratory status: spontaneous breathing, nonlabored ventilation, respiratory function stable and patient connected to nasal cannula oxygen Cardiovascular status: blood pressure returned to baseline and stable Postop Assessment: no apparent nausea or vomiting Anesthetic complications: no     Last Vitals:  Vitals:   05/09/18 0815 05/09/18 0830  BP: (!) 92/51 (!) 91/52  Pulse: (!) 49 (!) 52  Resp: 17 15  Temp:    SpO2: 93% 94%    Last Pain:  Vitals:   05/09/18 0815  TempSrc:   PainSc: 0-No pain                 Alphonsus Sias

## 2018-05-09 NOTE — CV Procedure (Signed)
Electrical Cardioversion Procedure Note Nocole Zammit [facility] [id number] 07/26/1951  Procedure: Electrical Cardioversion Indications:  Atrial Fibrillation  Procedure Details Consent: Risks of procedure as well as the alternatives and risks of each were explained to the (patient/caregiver).  Consent for procedure obtained. Time Out: Verified patient identification, verified procedure, site/side was marked, verified correct patient position, special equipment/implants available, medications/allergies/relevent history reviewed, required imaging and test results available.  Performed  Patient placed on cardiac monitor, pulse oximetry, supplemental oxygen as necessary.  Sedation given: Benzodiazepines and Short-acting barbiturates Pacer pads placed anterior and posterior chest.  Cardioverted 1 time(s).  Cardioverted at 120J.  Evaluation Findings: Post procedure EKG shows: NSR Complications: None Patient did tolerate procedure well.   Corey Skains 05/09/2018, 7:58 AM

## 2018-05-09 NOTE — Anesthesia Preprocedure Evaluation (Addendum)
Anesthesia Evaluation  Patient identified by MRN, date of birth, ID band Patient awake    Reviewed: Allergy & Precautions, H&P , NPO status , reviewed documented beta blocker date and time   Airway Mallampati: III  TM Distance: >3 FB Neck ROM: full    Dental  (+) Chipped   Pulmonary shortness of breath, sleep apnea and Continuous Positive Airway Pressure Ventilation , former smoker,    Pulmonary exam normal        Cardiovascular + dysrhythmias Atrial Fibrillation  Rhythm:irregular     Neuro/Psych PSYCHIATRIC DISORDERS Depression  Neuromuscular disease    GI/Hepatic GERD  Medicated and Controlled,(+) Hepatitis -  Endo/Other  Hypothyroidism Morbid obesity  Renal/GU      Musculoskeletal  (+) Arthritis , Fibromyalgia -  Abdominal   Peds  Hematology   Anesthesia Other Findings Past Medical History: No date: Arthritis No date: Depression No date: Dyspnea No date: Dysrhythmia 1992: Fibromyalgia No date: GERD (gastroesophageal reflux disease) No date: Hepatitis     Comment:  Hepatitis B 1985 No date: Mitral valve prolapse No date: Sleep apnea     Comment:  CPAP No date: Tendon tear, ankle     Comment:  rt foot No date: Thyroid dysfunction  Past Surgical History: 04/06/2018: ACHILLES TENDON SURGERY; Right     Comment:  Procedure: ACHILLES TENDON REPAIR;  Surgeon: [name], DPM;  Location: [facility];  Service: Podiatry;                Laterality: Right; 02/06/2018: CARDIOVERSION; N/A     Comment:  Procedure: CARDIOVERSION;  Surgeon: Corey Skains,               MD;  Location: [facility];  Service: Cardiovascular;                Laterality: N/A; 03/28/2018: CARDIOVERSION; N/A     Comment:  Procedure: CARDIOVERSION;  Surgeon: Corey Skains,               MD;  Location: [facility];  Service: Cardiovascular;                Laterality: N/A; No date: CHOLECYSTECTOMY 08/03/2017: CONTINUOUS NERVE  MONITORING; N/A     Comment:  Procedure: LARYNGEAL NERVE MONITORING;  Surgeon: Carloyn Manner, MD;  Location: [facility];  Service: ENT;                Laterality: N/A; No date: EYE SURGERY; Bilateral 2000: FOOT SURGERY; Left No date: JOINT REPLACEMENT     Comment:  left knee 04/06/2018: OSTECTOMY; Right     Comment:  Procedure: OSTECTOMY-HAGLUNDS/RECTROCALCANEAL;  Surgeon:              [name], DPM;  Location: [facility];  Service:               Podiatry;  Laterality: Right; No date: SHOULDER ARTHROSCOPY; Bilateral No date: SHOULDER ARTHROSCOPY; Left 08/03/2017: THYROIDECTOMY; N/A     Comment:  Procedure: THYROIDECTOMY;  Surgeon: Carloyn Manner,               MD;  Location: [facility];  Service: ENT;  Laterality: N/A; No date: TONSILLECTOMY No date: TUBAL LIGATION     Reproductive/Obstetrics  Anesthesia Physical Anesthesia Plan  ASA: III  Anesthesia Plan: General   Post-op Pain Management:    Induction: Intravenous  PONV Risk Score and Plan: Treatment may vary due to age or medical condition and TIVA  Airway Management Planned: Nasal Cannula and Natural Airway  Additional Equipment:   Intra-op Plan:   Post-operative Plan:   Informed Consent: I have reviewed the patients History and Physical, chart, labs and discussed the procedure including the risks, benefits and alternatives for the proposed anesthesia with the patient or authorized representative who has indicated his/her understanding and acceptance.   Dental Advisory Given  Plan Discussed with: CRNA  Anesthesia Plan Comments:         Anesthesia Quick Evaluation

## 2018-05-09 NOTE — Anesthesia Post-op Follow-up Note (Signed)
Anesthesia QCDR form completed.        

## 2018-06-21 ENCOUNTER — Other Ambulatory Visit: Payer: Self-pay | Admitting: Internal Medicine

## 2018-07-04 ENCOUNTER — Other Ambulatory Visit: Payer: Self-pay | Admitting: Family Medicine

## 2018-07-04 DIAGNOSIS — Z13820 Encounter for screening for osteoporosis: Secondary | ICD-10-CM

## 2018-07-24 ENCOUNTER — Ambulatory Visit
Admission: RE | Admit: 2018-07-24 | Discharge: 2018-07-24 | Disposition: A | Discharge status: Home / Self Care | Payer: Medicare HMO | Source: Ambulatory Visit | Attending: Family Medicine | Admitting: Family Medicine

## 2018-07-24 DIAGNOSIS — Z13820 Encounter for screening for osteoporosis: Secondary | ICD-10-CM | POA: Diagnosis present

## 2018-07-24 DIAGNOSIS — M85852 Other specified disorders of bone density and structure, left thigh: Secondary | ICD-10-CM | POA: Diagnosis not present

## 2018-07-24 DIAGNOSIS — M85832 Other specified disorders of bone density and structure, left forearm: Secondary | ICD-10-CM | POA: Diagnosis not present

## 2018-08-14 DIAGNOSIS — I10 Essential (primary) hypertension: Secondary | ICD-10-CM | POA: Insufficient documentation

## 2019-06-22 DIAGNOSIS — Z9889 Other specified postprocedural states: Secondary | ICD-10-CM | POA: Insufficient documentation

## 2019-06-22 DIAGNOSIS — G4700 Insomnia, unspecified: Secondary | ICD-10-CM | POA: Insufficient documentation

## 2019-06-22 DIAGNOSIS — Z8679 Personal history of other diseases of the circulatory system: Secondary | ICD-10-CM | POA: Insufficient documentation

## 2019-10-17 ENCOUNTER — Other Ambulatory Visit: Payer: Self-pay | Admitting: Family Medicine

## 2019-10-18 ENCOUNTER — Other Ambulatory Visit: Payer: Self-pay

## 2019-10-18 ENCOUNTER — Other Ambulatory Visit
Admission: RE | Admit: 2019-10-18 | Discharge: 2019-10-18 | Disposition: A | Discharge status: Home / Self Care | Payer: Medicare HMO | Source: Ambulatory Visit | Attending: Internal Medicine | Admitting: Internal Medicine

## 2019-10-18 DIAGNOSIS — Z01812 Encounter for preprocedural laboratory examination: Secondary | ICD-10-CM | POA: Diagnosis present

## 2019-10-18 DIAGNOSIS — Z20822 Contact with and (suspected) exposure to covid-19: Secondary | ICD-10-CM | POA: Insufficient documentation

## 2019-10-19 LAB — SARS CORONAVIRUS 2 (TAT 6-24 HRS): SARS Coronavirus 2: NEGATIVE

## 2019-10-23 ENCOUNTER — Other Ambulatory Visit: Payer: Self-pay

## 2019-10-23 ENCOUNTER — Encounter: Payer: Self-pay | Admitting: Internal Medicine

## 2019-10-23 ENCOUNTER — Ambulatory Visit
Admission: RE | Admit: 2019-10-23 | Discharge: 2019-10-23 | Disposition: A | Discharge status: Home / Self Care | Payer: Medicare HMO | Attending: Internal Medicine | Admitting: Internal Medicine

## 2019-10-23 ENCOUNTER — Ambulatory Visit: Payer: Medicare HMO | Admitting: Registered Nurse

## 2019-10-23 ENCOUNTER — Encounter
Admission: RE | Disposition: A | Discharge status: Home / Self Care | Payer: Self-pay | Source: Home / Self Care | Attending: Internal Medicine

## 2019-10-23 DIAGNOSIS — I480 Paroxysmal atrial fibrillation: Secondary | ICD-10-CM | POA: Insufficient documentation

## 2019-10-23 DIAGNOSIS — Z87891 Personal history of nicotine dependence: Secondary | ICD-10-CM | POA: Diagnosis not present

## 2019-10-23 DIAGNOSIS — K219 Gastro-esophageal reflux disease without esophagitis: Secondary | ICD-10-CM | POA: Insufficient documentation

## 2019-10-23 DIAGNOSIS — Z79899 Other long term (current) drug therapy: Secondary | ICD-10-CM | POA: Insufficient documentation

## 2019-10-23 DIAGNOSIS — Z7901 Long term (current) use of anticoagulants: Secondary | ICD-10-CM | POA: Insufficient documentation

## 2019-10-23 DIAGNOSIS — Z6839 Body mass index (BMI) 39.0-39.9, adult: Secondary | ICD-10-CM | POA: Insufficient documentation

## 2019-10-23 DIAGNOSIS — F329 Major depressive disorder, single episode, unspecified: Secondary | ICD-10-CM | POA: Insufficient documentation

## 2019-10-23 DIAGNOSIS — E785 Hyperlipidemia, unspecified: Secondary | ICD-10-CM | POA: Insufficient documentation

## 2019-10-23 DIAGNOSIS — G4733 Obstructive sleep apnea (adult) (pediatric): Secondary | ICD-10-CM | POA: Diagnosis not present

## 2019-10-23 DIAGNOSIS — I4892 Unspecified atrial flutter: Secondary | ICD-10-CM | POA: Insufficient documentation

## 2019-10-23 DIAGNOSIS — I2510 Atherosclerotic heart disease of native coronary artery without angina pectoris: Secondary | ICD-10-CM | POA: Insufficient documentation

## 2019-10-23 DIAGNOSIS — E890 Postprocedural hypothyroidism: Secondary | ICD-10-CM | POA: Diagnosis not present

## 2019-10-23 DIAGNOSIS — I10 Essential (primary) hypertension: Secondary | ICD-10-CM | POA: Insufficient documentation

## 2019-10-23 HISTORY — PX: CARDIOVERSION: SHX1299

## 2019-10-23 SURGERY — CARDIOVERSION
Anesthesia: General

## 2019-10-23 MED ORDER — PROPOFOL 10 MG/ML IV BOLUS
INTRAVENOUS | Status: DC | PRN
  Administered 2019-10-23: 90 mg via INTRAVENOUS

## 2019-10-23 MED ORDER — SODIUM CHLORIDE 0.9 % IV SOLN: INTRAVENOUS | Status: DC | PRN

## 2019-10-23 MED ORDER — PROPOFOL 10 MG/ML IV BOLUS
INTRAVENOUS | Status: AC
  Filled 2019-10-23: qty 20

## 2019-10-23 NOTE — Anesthesia Procedure Notes (Signed)
Date/Time: 10/23/2019 7:33 AM Performed by: Doreen Salvage, CRNA Pre-anesthesia Checklist: Patient identified, Emergency Drugs available, Suction available and Patient being monitored Patient Re-evaluated:Patient Re-evaluated prior to induction Oxygen Delivery Method: Nasal cannula Induction Type: IV induction Dental Injury: Teeth and Oropharynx as per pre-operative assessment  Comments: Nasal cannula with etCO2 monitoring

## 2019-10-23 NOTE — CV Procedure (Signed)
Electrical Cardioversion Procedure Note Mandy Jones [facility] [id number] 1951/02/17  Procedure: Electrical Cardioversion Indications:  paroxysmal non valvular atrial flutter  Procedure Details Consent: Risks of procedure as well as the alternatives and risks of each were explained to the (patient/caregiver).  Consent for procedure obtained. Time Out: Verified patient identification, verified procedure, site/side was marked, verified correct patient position, special equipment/implants available, medications/allergies/relevent history reviewed, required imaging and test results available.  Performed  Patient placed on cardiac monitor, pulse oximetry, supplemental oxygen as necessary.  Sedation given: Propofol and versed as per anesthesia  Pacer pads placed anterior and posterior chest.  Cardioverted 1 time(s).  Cardioverted at 120J.  Evaluation Findings: Post procedure EKG shows: NSR Complications: None Patient did tolerate procedure well.   Mandy Jones M.D. [facility] 10/23/2019, 7:55 AM

## 2019-10-23 NOTE — Transfer of Care (Signed)
Immediate Anesthesia Transfer of Care Note  Patient: Mandy Jones [facility]  Procedure(s) Performed: Procedure(s): CARDIOVERSION (N/A)  Patient Location: PACU and Short Stay  Anesthesia Type:General  Level of Consciousness: awake, alert  and oriented  Airway & Oxygen Therapy: Patient Spontanous Breathing and Patient connected to nasal cannula oxygen  Post-op Assessment: Report given to RN and Post -op Vital signs reviewed and stable  Post vital signs: Reviewed and stable  Last Vitals:  Vitals:   10/23/19 0744 10/23/19 0745  BP:  102/68  Pulse: (!) 47   Resp: (!) 23 (!) 22  Temp:    SpO2: 0000000 [id number]    Complications: No apparent anesthesia complications

## 2019-10-23 NOTE — Anesthesia Postprocedure Evaluation (Signed)
Anesthesia Post Note  Patient: Mandy Jones [facility]  Procedure(s) Performed: CARDIOVERSION (N/A )  Patient location during evaluation: Specials Recovery Anesthesia Type: General Level of consciousness: awake and alert Pain management: pain level controlled Vital Signs Assessment: post-procedure vital signs reviewed and stable Respiratory status: spontaneous breathing, nonlabored ventilation, respiratory function stable and patient connected to nasal cannula oxygen Cardiovascular status: blood pressure returned to baseline and stable Postop Assessment: no apparent nausea or vomiting Anesthetic complications: no     Last Vitals:  Vitals:   10/23/19 0830 10/23/19 0840  BP: (!) 97/57   Pulse: (!) 48 (!) 50  Resp: 18 (!) 21  Temp:    SpO2: 95% 96%    Last Pain:  Vitals:   10/23/19 0707  TempSrc: Oral                 Martha Clan

## 2019-10-23 NOTE — Progress Notes (Signed)
Dr. Nehemiah Massed at bedside, speaking with pt. Re: slow HR, rhythm. Pt. Okay'd for DC home now from MD.

## 2019-10-23 NOTE — Anesthesia Preprocedure Evaluation (Signed)
Anesthesia Evaluation  Patient identified by MRN, date of birth, ID band Patient awake    Reviewed: Allergy & Precautions, H&P , NPO status , reviewed documented beta blocker date and time   Airway Mallampati: III  TM Distance: >3 FB Neck ROM: full    Dental  (+) Chipped   Pulmonary shortness of breath, sleep apnea and Continuous Positive Airway Pressure Ventilation , former smoker,    Pulmonary exam normal        Cardiovascular + dysrhythmias Atrial Fibrillation  Rhythm:irregular     Neuro/Psych PSYCHIATRIC DISORDERS Depression  Neuromuscular disease    GI/Hepatic GERD  Medicated and Controlled,(+) Hepatitis -  Endo/Other  Hypothyroidism Morbid obesity  Renal/GU      Musculoskeletal  (+) Arthritis , Fibromyalgia -  Abdominal   Peds  Hematology   Anesthesia Other Findings Past Medical History: No date: Arthritis No date: Depression No date: Dyspnea No date: Dysrhythmia 1992: Fibromyalgia No date: GERD (gastroesophageal reflux disease) No date: Hepatitis     Comment:  Hepatitis B 1985 No date: Mitral valve prolapse No date: Sleep apnea     Comment:  CPAP No date: Tendon tear, ankle     Comment:  rt foot No date: Thyroid dysfunction  Past Surgical History: 04/06/2018: ACHILLES TENDON SURGERY; Right     Comment:  Procedure: ACHILLES TENDON REPAIR;  Surgeon: [name], DPM;  Location: [facility];  Service: Podiatry;                Laterality: Right; 02/06/2018: CARDIOVERSION; N/A     Comment:  Procedure: CARDIOVERSION;  Surgeon: Corey Skains,               MD;  Location: [facility];  Service: Cardiovascular;                Laterality: N/A; 03/28/2018: CARDIOVERSION; N/A     Comment:  Procedure: CARDIOVERSION;  Surgeon: Corey Skains,               MD;  Location: [facility];  Service: Cardiovascular;                Laterality: N/A; No date: CHOLECYSTECTOMY 08/03/2017: CONTINUOUS NERVE  MONITORING; N/A     Comment:  Procedure: LARYNGEAL NERVE MONITORING;  Surgeon: Carloyn Manner, MD;  Location: [facility];  Service: ENT;                Laterality: N/A; No date: EYE SURGERY; Bilateral 2000: FOOT SURGERY; Left No date: JOINT REPLACEMENT     Comment:  left knee 04/06/2018: OSTECTOMY; Right     Comment:  Procedure: OSTECTOMY-HAGLUNDS/RECTROCALCANEAL;  Surgeon:              [name], DPM;  Location: [facility];  Service:               Podiatry;  Laterality: Right; No date: SHOULDER ARTHROSCOPY; Bilateral No date: SHOULDER ARTHROSCOPY; Left 08/03/2017: THYROIDECTOMY; N/A     Comment:  Procedure: THYROIDECTOMY;  Surgeon: Carloyn Manner,               MD;  Location: [facility];  Service: ENT;  Laterality: N/A; No date: TONSILLECTOMY No date: TUBAL LIGATION     Reproductive/Obstetrics  Anesthesia Physical  Anesthesia Plan  ASA: III  Anesthesia Plan: General   Post-op Pain Management:    Induction: Intravenous  PONV Risk Score and Plan: 3 and Treatment may vary due to age or medical condition, TIVA and Propofol infusion  Airway Management Planned: Nasal Cannula and Natural Airway  Additional Equipment:   Intra-op Plan:   Post-operative Plan:   Informed Consent: I have reviewed the patients History and Physical, chart, labs and discussed the procedure including the risks, benefits and alternatives for the proposed anesthesia with the patient or authorized representative who has indicated his/her understanding and acceptance.     Dental Advisory Given  Plan Discussed with: CRNA  Anesthesia Plan Comments:         Anesthesia Quick Evaluation

## 2019-11-04 DIAGNOSIS — R0609 Other forms of dyspnea: Secondary | ICD-10-CM | POA: Insufficient documentation

## 2020-01-22 DIAGNOSIS — Z96651 Presence of right artificial knee joint: Secondary | ICD-10-CM | POA: Insufficient documentation

## 2020-02-09 ENCOUNTER — Encounter: Payer: Self-pay | Admitting: Emergency Medicine

## 2020-02-09 ENCOUNTER — Emergency Department: Payer: Medicare HMO

## 2020-02-09 ENCOUNTER — Observation Stay
Admission: EM | Admit: 2020-02-09 | Discharge: 2020-02-11 | Disposition: A | Discharge status: Home / Self Care | Payer: Medicare HMO | Attending: Student | Admitting: Student

## 2020-02-09 DIAGNOSIS — K5730 Diverticulosis of large intestine without perforation or abscess without bleeding: Secondary | ICD-10-CM | POA: Diagnosis not present

## 2020-02-09 DIAGNOSIS — M5136 Other intervertebral disc degeneration, lumbar region: Secondary | ICD-10-CM | POA: Diagnosis not present

## 2020-02-09 DIAGNOSIS — Z96652 Presence of left artificial knee joint: Secondary | ICD-10-CM | POA: Insufficient documentation

## 2020-02-09 DIAGNOSIS — F329 Major depressive disorder, single episode, unspecified: Secondary | ICD-10-CM | POA: Diagnosis not present

## 2020-02-09 DIAGNOSIS — K219 Gastro-esophageal reflux disease without esophagitis: Secondary | ICD-10-CM | POA: Insufficient documentation

## 2020-02-09 DIAGNOSIS — I5033 Acute on chronic diastolic (congestive) heart failure: Secondary | ICD-10-CM | POA: Diagnosis not present

## 2020-02-09 DIAGNOSIS — M1990 Unspecified osteoarthritis, unspecified site: Secondary | ICD-10-CM | POA: Insufficient documentation

## 2020-02-09 DIAGNOSIS — Z20822 Contact with and (suspected) exposure to covid-19: Secondary | ICD-10-CM | POA: Insufficient documentation

## 2020-02-09 DIAGNOSIS — R5383 Other fatigue: Secondary | ICD-10-CM | POA: Insufficient documentation

## 2020-02-09 DIAGNOSIS — J9601 Acute respiratory failure with hypoxia: Secondary | ICD-10-CM | POA: Diagnosis present

## 2020-02-09 DIAGNOSIS — E890 Postprocedural hypothyroidism: Secondary | ICD-10-CM | POA: Insufficient documentation

## 2020-02-09 DIAGNOSIS — N136 Pyonephrosis: Secondary | ICD-10-CM | POA: Diagnosis not present

## 2020-02-09 DIAGNOSIS — I110 Hypertensive heart disease with heart failure: Secondary | ICD-10-CM | POA: Diagnosis not present

## 2020-02-09 DIAGNOSIS — E785 Hyperlipidemia, unspecified: Secondary | ICD-10-CM | POA: Diagnosis not present

## 2020-02-09 DIAGNOSIS — Z7901 Long term (current) use of anticoagulants: Secondary | ICD-10-CM | POA: Insufficient documentation

## 2020-02-09 DIAGNOSIS — I341 Nonrheumatic mitral (valve) prolapse: Secondary | ICD-10-CM | POA: Diagnosis not present

## 2020-02-09 DIAGNOSIS — N10 Acute tubulo-interstitial nephritis: Secondary | ICD-10-CM

## 2020-02-09 DIAGNOSIS — I700 Atherosclerosis of aorta: Secondary | ICD-10-CM | POA: Insufficient documentation

## 2020-02-09 DIAGNOSIS — I480 Paroxysmal atrial fibrillation: Secondary | ICD-10-CM | POA: Diagnosis not present

## 2020-02-09 DIAGNOSIS — I4510 Unspecified right bundle-branch block: Secondary | ICD-10-CM | POA: Insufficient documentation

## 2020-02-09 DIAGNOSIS — K429 Umbilical hernia without obstruction or gangrene: Secondary | ICD-10-CM | POA: Diagnosis not present

## 2020-02-09 DIAGNOSIS — D259 Leiomyoma of uterus, unspecified: Secondary | ICD-10-CM | POA: Diagnosis not present

## 2020-02-09 DIAGNOSIS — R310 Gross hematuria: Secondary | ICD-10-CM | POA: Insufficient documentation

## 2020-02-09 DIAGNOSIS — D1771 Benign lipomatous neoplasm of kidney: Secondary | ICD-10-CM | POA: Insufficient documentation

## 2020-02-09 DIAGNOSIS — N200 Calculus of kidney: Secondary | ICD-10-CM | POA: Diagnosis present

## 2020-02-09 DIAGNOSIS — M797 Fibromyalgia: Secondary | ICD-10-CM | POA: Insufficient documentation

## 2020-02-09 DIAGNOSIS — G4733 Obstructive sleep apnea (adult) (pediatric): Secondary | ICD-10-CM | POA: Diagnosis not present

## 2020-02-09 DIAGNOSIS — Z888 Allergy status to other drugs, medicaments and biological substances status: Secondary | ICD-10-CM | POA: Insufficient documentation

## 2020-02-09 DIAGNOSIS — Z8619 Personal history of other infectious and parasitic diseases: Secondary | ICD-10-CM | POA: Diagnosis not present

## 2020-02-09 DIAGNOSIS — N132 Hydronephrosis with renal and ureteral calculous obstruction: Secondary | ICD-10-CM

## 2020-02-09 DIAGNOSIS — Z9049 Acquired absence of other specified parts of digestive tract: Secondary | ICD-10-CM | POA: Insufficient documentation

## 2020-02-09 DIAGNOSIS — Z79899 Other long term (current) drug therapy: Secondary | ICD-10-CM | POA: Insufficient documentation

## 2020-02-09 DIAGNOSIS — Z87891 Personal history of nicotine dependence: Secondary | ICD-10-CM | POA: Insufficient documentation

## 2020-02-09 DIAGNOSIS — K449 Diaphragmatic hernia without obstruction or gangrene: Secondary | ICD-10-CM | POA: Insufficient documentation

## 2020-02-09 DIAGNOSIS — I5031 Acute diastolic (congestive) heart failure: Secondary | ICD-10-CM | POA: Diagnosis present

## 2020-02-09 DIAGNOSIS — N83202 Unspecified ovarian cyst, left side: Secondary | ICD-10-CM | POA: Insufficient documentation

## 2020-02-09 LAB — URINALYSIS, ROUTINE W REFLEX MICROSCOPIC
Bilirubin Urine: NEGATIVE
Glucose, UA: NEGATIVE mg/dL
Ketones, ur: 80 mg/dL — AB
Leukocytes,Ua: NEGATIVE
Nitrite: NEGATIVE
Protein, ur: NEGATIVE mg/dL
RBC / HPF: >50 RBC/hpf — ABNORMAL HIGH (ref 0–5)
Specific Gravity, Urine: 1.02 (ref 1.005–1.030)
Squamous Epithelial / HPF: NONE SEEN (ref 0–5)
pH: 7 (ref 5.0–8.0)

## 2020-02-09 LAB — CBC
HCT: 41 % (ref 36.0–46.0)
Hemoglobin: 13.7 g/dL (ref 12.0–15.0)
MCH: 28.7 pg (ref 26.0–34.0)
MCHC: 33.4 g/dL (ref 30.0–36.0)
MCV: 86 fL (ref 80.0–100.0)
Platelets: 256 10*3/uL (ref 150–400)
RBC: 4.77 MIL/uL (ref 3.87–5.11)
RDW: 14.7 % (ref 11.5–15.5)
WBC: 8.4 10*3/uL (ref 4.0–10.5)
nRBC: 0 % (ref 0.0–0.2)

## 2020-02-09 LAB — BASIC METABOLIC PANEL
Anion gap: 12 (ref 5–15)
BUN: 14 mg/dL (ref 8–23)
CO2: 23 mmol/L (ref 22–32)
Calcium: 9.3 mg/dL (ref 8.9–10.3)
Chloride: 106 mmol/L (ref 98–111)
Creatinine, Ser: 0.62 mg/dL (ref 0.44–1.00)
GFR calc Af Amer: >60 mL/min (ref >60)
GFR calc non Af Amer: >60 mL/min (ref >60)
Glucose, Bld: 125 mg/dL — ABNORMAL HIGH (ref 70–99)
Potassium: 3.7 mmol/L (ref 3.5–5.1)
Sodium: 141 mmol/L (ref 135–145)

## 2020-02-09 LAB — BRAIN NATRIURETIC PEPTIDE: B Natriuretic Peptide: 232.6 pg/mL — ABNORMAL HIGH (ref 0.0–100.0)

## 2020-02-09 MED ORDER — ONDANSETRON 4 MG PO TBDP
4.0000 mg | ORAL_TABLET | Freq: Once | ORAL | Status: DC
  Filled 2020-02-09: qty 1

## 2020-02-09 MED ORDER — ONDANSETRON HCL 4 MG/2ML IJ SOLN
4.0000 mg | Freq: Once | INTRAMUSCULAR | Status: AC
  Administered 2020-02-09: 4 mg via INTRAVENOUS
  Filled 2020-02-09: qty 2

## 2020-02-09 MED ORDER — FUROSEMIDE 10 MG/ML IJ SOLN
40.0000 mg | Freq: Once | INTRAMUSCULAR | Status: AC
  Administered 2020-02-10: 40 mg via INTRAVENOUS
  Filled 2020-02-09: qty 4

## 2020-02-09 MED ORDER — FENTANYL CITRATE (PF) 100 MCG/2ML IJ SOLN
50.0000 ug | Freq: Once | INTRAMUSCULAR | Status: AC
  Administered 2020-02-09: 50 ug via INTRAVENOUS
  Filled 2020-02-09: qty 2

## 2020-02-09 MED ORDER — IOHEXOL 300 MG/ML  SOLN
100.0000 mL | Freq: Once | INTRAMUSCULAR | Status: AC | PRN
  Administered 2020-02-09: 100 mL via INTRAVENOUS

## 2020-02-09 NOTE — ED Notes (Signed)
Pt transported to CT ?

## 2020-02-09 NOTE — ED Provider Notes (Addendum)
[facility] Emergency Department Provider Note  ____________________________________________   First MD Initiated Contact with Patient 02/09/20 2015     (approximate)  I have reviewed the triage vital signs and the nursing notes.   HISTORY  Chief Complaint Emesis and Back Pain    HPI Mandy Jones is a 69 y.o. female with arthritis, fibromyalgia, A. fib on blood thinner who comes in for back pain.  Patient reports going to an urgent care a few days ago for hematuria been diagnosed with UTI and started on ciprofloxacin.  She states that then she developed right flank pain today associate with some chills and vomiting that have been intermittent, nothing makes it better, worse with certain movements.  Currently the pain is moderate in nature.  She has been compliant with her blood thinner.          Past Medical History:  Diagnosis Date  . Arthritis   . Depression   . Dyspnea   . Dysrhythmia   . Fibromyalgia 1992  . GERD (gastroesophageal reflux disease)   . Hepatitis    Hepatitis B 1985  . Mitral valve prolapse   . Sleep apnea    CPAP  . Tendon tear, ankle    rt foot  . Thyroid dysfunction     Patient Active Problem List   Diagnosis Date Noted  . Post-op pain 04/08/2018  . Post-operative pain 04/06/2018  . Atrial fibrillation with RVR ([place]) 12/14/2017  . Hypothyroidism 12/14/2017  . Valvular heart disease 12/14/2017  . Muscle cramps 12/14/2017  . S/P complete thyroidectomy 08/03/2017    Past Surgical History:  Procedure Laterality Date  . ACHILLES TENDON SURGERY Right 04/06/2018   Procedure: ACHILLES TENDON REPAIR;  Surgeon: [name], DPM;  Location: [facility] ORS;  Service: Podiatry;  Laterality: Right;  . CARDIOVERSION N/A 02/06/2018   Procedure: CARDIOVERSION;  Surgeon: Corey Skains, MD;  Location: [facility] ORS;  Service: Cardiovascular;  Laterality: N/A;  . CARDIOVERSION N/A 03/28/2018   Procedure: CARDIOVERSION;  Surgeon:  Corey Skains, MD;  Location: [facility] ORS;  Service: Cardiovascular;  Laterality: N/A;  . CARDIOVERSION N/A 05/09/2018   Procedure: CARDIOVERSION;  Surgeon: Corey Skains, MD;  Location: [facility] OR[facility];  Service: Cardiovascular;  Laterality: N/A;  . CARDIOVERSION N/A 10/23/2019   Procedure: CARDIOVERSION;  Surgeon: Corey Skains, MD;  Location: [facility] ORS;  Service: Cardiovascular;  Laterality: N/A;  . CHOLECYSTECTOMY    . CONTINUOUS NERVE MONITORING N/A 08/03/2017   Procedure: LARYNGEAL NERVE MONITORING;  Surgeon: Carloyn Manner, MD;  Location: [facility] ORS;  Service: ENT;  Laterality: N/A;  . EYE SURGERY Bilateral   . FOOT SURGERY Left 2000  . JOINT REPLACEMENT     left knee  . OSTECTOMY Right 04/06/2018   Procedure: OSTECTOMY-HAGLUNDS/RECTROCALCANEAL;  Surgeon: [name], DPM;  Location: [facility] OR[facility];  Service: Podiatry;  Laterality: Right;  . SHOULDER ARTHROSCOPY Bilateral   . SHOULDER ARTHROSCOPY Left   . THYROIDECTOMY N/A 08/03/2017   Procedure: THYROIDECTOMY;  Surgeon: Carloyn Manner, MD;  Location: [facility] OR[facility];  Service: ENT;  Laterality: N/A;  . TONSILLECTOMY    . TUBAL LIGATION      Prior to Admission medications   Medication Sig Start Date End Date Taking? Authorizing Provider  albuterol (VENTOLIN HFA) 108 (90 Base) MCG/ACT inhaler Inhale 1-2 puffs into the lungs every 6 (six) hours as needed for wheezing or shortness of breath.    [provider]  apixaban (ELIQUIS) 5 MG TABS tablet Take 1 tablet (5  mg total) by mouth 2 (two) times daily. 12/15/17   Nicholes Mango, MD  Ascorbic Acid (VITAMIN C) 1000 MG tablet Take 1,000 mg by mouth every evening.    [provider]  atorvastatin (LIPITOR) 10 MG tablet Take 10 mg by mouth daily. 05/05/17   [provider]  azelastine (ASTELIN) 0.1 % nasal spray Place 1 spray into both nostrils daily as needed for rhinitis or allergies.     [provider]  B Complex-C (B-COMPLEX WITH VITAMIN C) tablet Take 1 tablet by  mouth daily.    [provider]  buPROPion (WELLBUTRIN XL) 300 MG 24 hr tablet Take 300 mg by mouth daily.  05/10/17   [provider]  cholecalciferol (VITAMIN D) 25 MCG (1000 UNIT) tablet Take 1,000 Units by mouth at bedtime.    [provider]  cycloSPORINE (RESTASIS) 0.05 % ophthalmic emulsion Place 1 drop into both eyes 2 (two) times daily.     [provider]  diltiazem (CARDIZEM CD) 120 MG 24 hr capsule Take 120 mg by mouth daily. 10/04/19   [provider]  famotidine (PEPCID) 20 MG tablet Take 40 mg by mouth at bedtime. 10/09/19   [provider]  flecainide (TAMBOCOR) 100 MG tablet Take 100 mg by mouth 2 (two) times daily. 10/04/19   [provider]  fluticasone (FLONASE) 50 MCG/ACT nasal spray Place 1-2 sprays into both nostrils daily as needed for allergies or rhinitis.    [provider]  furosemide (LASIX) 40 MG tablet Take 40 mg by mouth daily. 10/04/19   [provider]  hydrOXYzine (ATARAX/VISTARIL) 25 MG tablet Take 25-50 mg by mouth daily as needed for anxiety. 06/21/19   [provider]  levothyroxine (SYNTHROID) 150 MCG tablet Take 150 mcg by mouth every Sunday. On an empty stomach 10/15/19   [provider]  levothyroxine (SYNTHROID) 175 MCG tablet Take 175 mcg by mouth See admin instructions. Take 1 tablet (175 mcg) by mouth on Mondays, Tuesdays, Wednesdays, Thursdays, Fridays, & Saturdays in the morning on a empty stomach. 10/09/19   [provider]  Melatonin 5 MG TABS Take 5 mg by mouth at bedtime as needed (sleep).     [provider]  mirtazapine (REMERON) 15 MG tablet Take 15 mg by mouth at bedtime as needed for sleep or pain. 10/03/19   [provider]  Multiple Vitamins-Minerals (PRESERVISION AREDS 2+MULTI VIT) CAPS Take 1 capsule by mouth in the morning and at bedtime.    [provider]  traZODone (DESYREL) 50 MG tablet Take 100 mg by mouth at  bedtime as needed for sleep.  12/04/17   [provider]    Allergies Latex  No family history on file.  Social History Social History   Tobacco Use  . Smoking status: Former Research scientist (life sciences)  . Smokeless tobacco: Never Used  . Tobacco comment: quit 30 years ago  Vaping Use  . Vaping Use: Never used  Substance Use Topics  . Alcohol use: Yes    Comment: occassional  . Drug use: Not Currently    Types: Marijuana      Review of Systems Constitutional: No fever/chills Eyes: No visual changes. ENT: No sore throat. Cardiovascular: Denies chest pain. Respiratory: Denies shortness of breath. Gastrointestinal: No abdominal pain.  Positive nausea vomiting no diarrhea.  No constipation.  Positive flank pain Genitourinary: Negative for dysuria.  Positive hematuria now resolving Musculoskeletal: Negative for back pain. Skin: Negative for rash. Neurological: Negative for headaches, focal  weakness or numbness. All other ROS negative ____________________________________________   PHYSICAL EXAM:  VITAL SIGNS: ED Triage Vitals  Enc Vitals Group     BP 02/09/20 1814 (!) 164/88     Pulse Rate 02/09/20 1814 82     Resp 02/09/20 1814 18     Temp 02/09/20 1814 97.8 F (36.6 C)     Temp Source 02/09/20 1814 Oral     SpO2 02/09/20 1814 95 %     Weight 02/09/20 1815 218 lb (98.9 kg)     Height 02/09/20 1815 5\' 2"  (1.575 m)     Head Circumference --      Peak Flow --      Pain Score 02/09/20 1814 5     Pain Loc --      Pain Edu? --      Excl. in [place]? --     Constitutional: Alert and oriented. Well appearing and in no acute distress. Eyes: Conjunctivae are normal. EOMI. Head: Atraumatic. Nose: No congestion/rhinnorhea. Mouth/Throat: Mucous membranes are moist.   Neck: No stridor. Trachea Midline. FROM Cardiovascular: Normal rate, regular rhythm. Grossly normal heart sounds.  Good peripheral circulation. Respiratory: Normal respiratory effort.  No retractions. Lungs  CTAB. Gastrointestinal: Soft and nontender. No distention. No abdominal bruits.  Right flank tenderness Musculoskeletal: No lower extremity tenderness nor edema.  No joint effusions. Neurologic:  Normal speech and language. No gross focal neurologic deficits are appreciated.  Skin:  Skin is warm, dry and intact. No rash noted. Psychiatric: Mood and affect are normal. Speech and behavior are normal. GU: Deferred   ____________________________________________   LABS (all labs ordered are listed, but only abnormal results are displayed)  Labs Reviewed  BASIC METABOLIC PANEL - Abnormal; Notable for the following components:      Result Value   Glucose, Bld 125 (*)    All other components within normal limits  URINALYSIS, ROUTINE W REFLEX MICROSCOPIC - Abnormal; Notable for the following components:   Color, Urine YELLOW (*)    APPearance HAZY (*)    Hgb urine dipstick LARGE (*)    Ketones, ur 80 (*)    RBC / HPF >50 (*)    Bacteria, UA RARE (*)    All other components within normal limits  BRAIN NATRIURETIC PEPTIDE - Abnormal; Notable for the following components:   B Natriuretic Peptide 232.6 (*)    All other components within normal limits  SARS CORONAVIRUS 2 BY RT PCR (HOSPITAL ORDER, [place] [facility])  CBC  BASIC METABOLIC PANEL  TROPONIN I (HIGH SENSITIVITY)   ____________________________________________   ED ECG REPORT I, Vanessa [place], the attending physician, personally viewed and interpreted this ECG.  EKG shows a sinus rhythm 84, no ST elevation, T wave inversion in V2 and V3, right bundle branch block that does appear new from prior, occasional sinus pause.  Reviewed prior EKGs and she is had some borderline QRS enlargement but no right bundle branch block previously determined.  Looks that she is had some of the sinus pauses before  ____________________________________________  RADIOLOGY Robert Bellow, personally viewed and evaluated these  images (plain radiographs) as part of my medical decision making, as well as reviewing the written report by the radiologist.  ED MD interpretation: Mild pulmonary edema  Official radiology report(s): CT ABDOMEN PELVIS W CONTRAST  Result Date: 02/09/2020 CLINICAL DATA:  Abdominal distension. Right flank pain. Nausea and vomiting. EXAM: CT ABDOMEN AND PELVIS WITH CONTRAST TECHNIQUE: Multidetector CT imaging  of the abdomen and pelvis was performed using the standard protocol following bolus administration of intravenous contrast. CONTRAST:  114mL OMNIPAQUE IOHEXOL 300 MG/ML  SOLN COMPARISON:  None. FINDINGS: Lower chest: Multi chamber cardiomegaly. Subsegmental atelectasis in both lower lobes, left greater than right. No pleural fluid. Hepatobiliary: No focal hepatic abnormality. Post cholecystectomy with mild intra and extrahepatic biliary ductal prominence. Pancreas: No ductal dilatation or inflammation. Spleen: Normal in size without focal abnormality. Adrenals/Urinary Tract: Nodular thickening of the left adrenal gland without dominant nodule. Normal right adrenal gland. Obstructing 6 x 7 mm stone in the right proximal ureter with moderate hydronephrosis and perinephric edema. There is delayed excretion from the right kidney on delayed phase imaging. Possible punctate nonobstructing stone in the lower right kidney. The more distal ureter is decompressed. No left hydronephrosis. No evidence of left renal calculi. Fat density lesion in the upper left kidney measuring 19 mm consistent with angiomyolipoma. There is an adjacent 8 mm possible enhancing lesion in the left upper kidney, series 2, image 24, too small to characterize. Additional small low-density lesions in the left kidney are too small to accurately characterize. Partially distended urinary bladder. No bladder wall thickening. No bladder stone. Stomach/Bowel: Small hiatal hernia. Small duodenal diverticulum. No small bowel obstruction or  inflammation. Normal appendix. Slightly high-riding cecum in the right mid abdomen. Moderate stool burden in the colon. Distal diverticulosis without diverticulitis. Vascular/Lymphatic: Mild aortic atherosclerosis and tortuosity. No aortic aneurysm. Patent portal vein. Scattered retroperitoneal and ileocolic lymph nodes are likely reactive. Reproductive: Lobulated uterine contours with multiple fibroids, some of which are calcified. 15 mm cyst in the left ovary. Right ovary not confidently visualized. No suspicious adnexal mass. Other: No ascites or free air. Tiny fat containing umbilical hernia. Musculoskeletal: Degenerative disc disease at L3-L4 with mild anterolisthesis. IMPRESSION: 1. Obstructing 6 x 7 mm stone in the right proximal ureter with moderate hydronephrosis and perinephric edema. 2. Possible additional punctate nonobstructing stone in the lower right kidney. 3. 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 There is an adjacent 8 mm possible enhancing lesion in the left upper kidney, too small to characterize. Consider nonemergent characterization with renal protocol MRI on an elective basis after resolution of acute event. 4. Uterine fibroids. 5. Small hiatal hernia. Distal colonic diverticulosis without diverticulitis. Aortic Atherosclerosis (ICD10-I70.0). Electronically Signed   By: Keith Rake M.D.   On: 02/09/2020 22:04   DG Chest Portable 1 View  Result Date: 02/09/2020 CLINICAL DATA:  Shortness of breath EXAM: PORTABLE CHEST 1 VIEW COMPARISON:  12/14/2017 FINDINGS: Cardiac shadow is enlarged but stable. Diffuse increased vascular congestion is noted with mild interstitial edema consistent with congestive failure. No focal infiltrate or sizable effusion is noted. No bony abnormality is seen. IMPRESSION: Changes of mild CHF. Electronically Signed   By: Inez Catalina M.D.   On: 02/09/2020 23:06    ____________________________________________   PROCEDURES  Procedure(s) performed  (including Critical Care):  .Critical Care Performed by: Vanessa Gholson, MD Authorized by: Vanessa [place], MD   Critical care provider statement:    Critical care time (minutes):  35   Critical care was necessary to treat or prevent imminent or life-threatening deterioration of the following conditions:  Respiratory failure   Critical care was time spent personally by me on the following activities:  Discussions with consultants, evaluation of patient's response to treatment, examination of patient, ordering and performing treatments and interventions, ordering and review of laboratory studies, ordering and review of radiographic studies, pulse oximetry, re-evaluation  of patient's condition, obtaining history from patient or surrogate and review of old charts     ____________________________________________   INITIAL IMPRESSION / ASSESSMENT AND PLAN / ED COURSE  Mandy Jones was evaluated in Emergency Department on 02/09/2020 for the symptoms described in the history of present illness. She was evaluated in the context of the global COVID-19 pandemic, which necessitated consideration that the patient might be at risk for infection with the SARS-CoV-2 virus that causes COVID-19. Institutional protocols and algorithms that pertain to the evaluation of patients at risk for COVID-19 are in a state of rapid change based on information released by regulatory bodies including the CDC and federal and state organizations. These policies and algorithms were followed during the patient's care in the ED.    Patient is a 69 year old who comes in with right flank pain in the setting of being recently on antibiotics for possible UTI.  CT imaging was ordered and confirmed a kidney stone on the right that was obstructed.  No signs of UTI at this time given urine is clean and patient is not febrile low to note patient is on antibiotics so may need consult to urology not emergently.  However patient was  hypoxic with ambulating to the bathroom which she did not really feeling short of breath.  She stated that she sometimes gets hypoxic and not really sure why.  However she desatted pretty low.  No wheezing but did sound crackly on exam.  Her chest x-ray concerning for pulmonary edema and her BNP was slightly elevated.  We will give a dose of Lasix.  Patient is already on a blood thinner so I doubt this is a PE.  Given this new hypoxic respiratory failure will discuss with the hospital team for admission       ____________________________________________   FINAL CLINICAL IMPRESSION(S) / ED DIAGNOSES   Final diagnoses:  Acute respiratory failure with hypoxia ([place])  Kidney stone      MEDICATIONS GIVEN DURING THIS VISIT:  Medications  furosemide (LASIX) injection 40 mg (has no administration in time range)  sodium chloride flush (NS) 0.9 % injection 3 mL (has no administration in time range)  sodium chloride flush (NS) 0.9 % injection 3 mL (has no administration in time range)  0.9 %  sodium chloride infusion (has no administration in time range)  acetaminophen (TYLENOL) tablet 650 mg (has no administration in time range)  ondansetron (ZOFRAN) injection 4 mg (has no administration in time range)  apixaban (ELIQUIS) tablet 5 mg (has no administration in time range)  furosemide (LASIX) injection 40 mg (has no administration in time range)  ramipril (ALTACE) capsule 2.5 mg ( Oral Canceled Entry 02/10/20 0006)  ALPRAZolam (XANAX) tablet 0.25 mg (has no administration in time range)  cefTRIAXone (ROCEPHIN) 1 g in sodium chloride 0.9 % 100 mL IVPB (has no administration in time range)  fentaNYL (SUBLIMAZE) injection 50 mcg (50 mcg Intravenous Given 02/09/20 2111)  ondansetron (ZOFRAN) injection 4 mg (4 mg Intravenous Given 02/09/20 2156)  iohexol (OMNIPAQUE) 300 MG/ML solution 100 mL (100 mLs Intravenous Contrast Given 02/09/20 2127)     ED Discharge Orders    None       Note:  This  document was prepared using Dragon voice recognition software and may include unintentional dictation errors.   Vanessa [place], MD 02/10/20 [id number]    Vanessa [place], MD 02/10/20 [phone]

## 2020-02-09 NOTE — ED Notes (Signed)
Pt recd prescription for Cipro from Urgent Care on Friday for UTI.

## 2020-02-09 NOTE — ED Notes (Signed)
Patient's O2 sat dropped to 78% when ambulating to bathroom.  Expiratory wheezes noted, increased work of breathing.  Returned to low 90s when placed back on 2L [place].

## 2020-02-09 NOTE — ED Notes (Signed)
Pt placed on 2L, desatting to mid 80's when sleeping.  Pt states she has a CPAP at home that she is supposed to use nightly.  Current O2 sat is 94% on 2L [place].

## 2020-02-09 NOTE — ED Triage Notes (Signed)
Pt to ED with c/o of [place] and back pain that started today. Pt states was seen Friday at Urgent Care for possible UTI/Kidney infections.

## 2020-02-10 ENCOUNTER — Observation Stay
Admit: 2020-02-10 | Discharge: 2020-02-10 | Disposition: A | Discharge status: Home / Self Care | Payer: Medicare HMO | Attending: Internal Medicine | Admitting: Internal Medicine

## 2020-02-10 ENCOUNTER — Observation Stay: Admit: 2020-02-10 | Payer: Medicare HMO

## 2020-02-10 ENCOUNTER — Other Ambulatory Visit: Payer: Self-pay

## 2020-02-10 ENCOUNTER — Observation Stay: Payer: Medicare HMO

## 2020-02-10 ENCOUNTER — Encounter: Payer: Self-pay | Admitting: Internal Medicine

## 2020-02-10 DIAGNOSIS — I5031 Acute diastolic (congestive) heart failure: Secondary | ICD-10-CM | POA: Diagnosis not present

## 2020-02-10 LAB — TROPONIN I (HIGH SENSITIVITY)
Troponin I (High Sensitivity): 12 ng/L (ref <18)
Troponin I (High Sensitivity): 208 ng/L (ref <18)
Troponin I (High Sensitivity): 183 ng/L (ref <18)
Troponin I (High Sensitivity): 112 ng/L (ref <18)

## 2020-02-10 LAB — SARS CORONAVIRUS 2 BY RT PCR (HOSPITAL ORDER, PERFORMED IN ~~LOC~~ HOSPITAL LAB): SARS Coronavirus 2: NEGATIVE

## 2020-02-10 LAB — BASIC METABOLIC PANEL
Anion gap: 10 (ref 5–15)
BUN: 12 mg/dL (ref 8–23)
CO2: 28 mmol/L (ref 22–32)
Calcium: 9.2 mg/dL (ref 8.9–10.3)
Chloride: 104 mmol/L (ref 98–111)
Creatinine, Ser: 0.63 mg/dL (ref 0.44–1.00)
GFR calc Af Amer: >60 mL/min (ref >60)
GFR calc non Af Amer: >60 mL/min (ref >60)
Glucose, Bld: 129 mg/dL — ABNORMAL HIGH (ref 70–99)
Potassium: 3.6 mmol/L (ref 3.5–5.1)
Sodium: 142 mmol/L (ref 135–145)

## 2020-02-10 LAB — PHOSPHORUS: Phosphorus: 3.9 mg/dL (ref 2.5–4.6)

## 2020-02-10 LAB — ECHOCARDIOGRAM COMPLETE
Height: 62 in
Weight: 3488 oz

## 2020-02-10 LAB — MAGNESIUM: Magnesium: 2.2 mg/dL (ref 1.7–2.4)

## 2020-02-10 LAB — APTT
aPTT: 36 seconds (ref 24–36)
aPTT: 35 seconds (ref 24–36)

## 2020-02-10 LAB — PROTIME-INR
INR: 1.1 (ref 0.8–1.2)
Prothrombin Time: 13.4 seconds (ref 11.4–15.2)

## 2020-02-10 LAB — TSH: TSH: 9.326 u[IU]/mL — ABNORMAL HIGH (ref 0.350–4.500)

## 2020-02-10 LAB — HEPARIN LEVEL (UNFRACTIONATED): Heparin Unfractionated: 2.3 IU/mL — ABNORMAL HIGH (ref 0.30–0.70)

## 2020-02-10 MED ORDER — MELATONIN 5 MG PO TABS
5.0000 mg | ORAL_TABLET | Freq: Every evening | ORAL | Status: DC | PRN
  Filled 2020-02-10: qty 1

## 2020-02-10 MED ORDER — AZELASTINE HCL 0.1 % NA SOLN
1.0000 | Freq: Every day | NASAL | Status: DC | PRN
  Filled 2020-02-10: qty 30

## 2020-02-10 MED ORDER — B COMPLEX-C PO TABS
1.0000 | ORAL_TABLET | Freq: Every day | ORAL | Status: DC
  Administered 2020-02-11: 1 via ORAL
  Filled 2020-02-10 (×2): qty 1

## 2020-02-10 MED ORDER — ACETAMINOPHEN 325 MG PO TABS: 650.0000 mg | ORAL_TABLET | ORAL | Status: DC | PRN

## 2020-02-10 MED ORDER — ALBUTEROL SULFATE (2.5 MG/3ML) 0.083% IN NEBU
2.5000 mg | INHALATION_SOLUTION | Freq: Four times a day (QID) | RESPIRATORY_TRACT | Status: DC | PRN
  Filled 2020-02-10: qty 3

## 2020-02-10 MED ORDER — HYDROXYZINE HCL 25 MG PO TABS: 25.0000 mg | ORAL_TABLET | Freq: Every day | ORAL | Status: DC | PRN

## 2020-02-10 MED ORDER — FUROSEMIDE 10 MG/ML IJ SOLN
40.0000 mg | Freq: Two times a day (BID) | INTRAMUSCULAR | Status: DC
  Administered 2020-02-10 – 2020-02-11 (×3): 40 mg via INTRAVENOUS
  Filled 2020-02-10 (×4): qty 4

## 2020-02-10 MED ORDER — APIXABAN 5 MG PO TABS
5.0000 mg | ORAL_TABLET | Freq: Two times a day (BID) | ORAL | Status: DC
  Administered 2020-02-10: 5 mg via ORAL
  Filled 2020-02-10: qty 1

## 2020-02-10 MED ORDER — ALBUTEROL SULFATE HFA 108 (90 BASE) MCG/ACT IN AERS: 1.0000 | INHALATION_SPRAY | Freq: Four times a day (QID) | RESPIRATORY_TRACT | Status: DC | PRN

## 2020-02-10 MED ORDER — RAMIPRIL 2.5 MG PO CAPS
2.5000 mg | ORAL_CAPSULE | Freq: Two times a day (BID) | ORAL | Status: DC
  Administered 2020-02-10 – 2020-02-11 (×2): 2.5 mg via ORAL
  Filled 2020-02-10 (×5): qty 1

## 2020-02-10 MED ORDER — TRAZODONE HCL 100 MG PO TABS: 100.0000 mg | ORAL_TABLET | Freq: Every evening | ORAL | Status: DC | PRN

## 2020-02-10 MED ORDER — LEVOTHYROXINE SODIUM 50 MCG PO TABS
150.0000 ug | ORAL_TABLET | ORAL | Status: DC
  Administered 2020-02-10: 150 ug via ORAL

## 2020-02-10 MED ORDER — HEPARIN (PORCINE) 25000 UT/250ML-% IV SOLN
1000.0000 [IU]/h | INTRAVENOUS | Status: DC
  Administered 2020-02-10: 1000 [IU]/h via INTRAVENOUS
  Filled 2020-02-10: qty 250

## 2020-02-10 MED ORDER — ENOXAPARIN SODIUM 100 MG/ML ~~LOC~~ SOLN
1.0000 mg/kg | Freq: Two times a day (BID) | SUBCUTANEOUS | Status: DC
  Filled 2020-02-10 (×3): qty 1

## 2020-02-10 MED ORDER — ALPRAZOLAM 0.25 MG PO TABS: 0.2500 mg | ORAL_TABLET | Freq: Two times a day (BID) | ORAL | Status: DC | PRN

## 2020-02-10 MED ORDER — SODIUM CHLORIDE 0.9 % IV SOLN: 250.0000 mL | INTRAVENOUS | Status: DC | PRN

## 2020-02-10 MED ORDER — SODIUM CHLORIDE 0.9 % IV SOLN
1.0000 g | INTRAVENOUS | Status: DC
  Administered 2020-02-10 – 2020-02-11 (×2): 1 g via INTRAVENOUS
  Filled 2020-02-10: qty 10
  Filled 2020-02-10: qty 1
  Filled 2020-02-10: qty 10

## 2020-02-10 MED ORDER — ONDANSETRON HCL 4 MG/2ML IJ SOLN
4.0000 mg | Freq: Four times a day (QID) | INTRAMUSCULAR | Status: DC | PRN
  Administered 2020-02-11: 4 mg via INTRAVENOUS
  Filled 2020-02-10: qty 2

## 2020-02-10 MED ORDER — SODIUM CHLORIDE 0.9% FLUSH
3.0000 mL | Freq: Two times a day (BID) | INTRAVENOUS | Status: DC
  Administered 2020-02-10 – 2020-02-11 (×3): 3 mL via INTRAVENOUS

## 2020-02-10 MED ORDER — SODIUM CHLORIDE 0.9% FLUSH
3.0000 mL | INTRAVENOUS | Status: DC | PRN
  Administered 2020-02-10: 3 mL via INTRAVENOUS

## 2020-02-10 MED ORDER — ASCORBIC ACID 500 MG PO TABS
1000.0000 mg | ORAL_TABLET | Freq: Every evening | ORAL | Status: DC
  Administered 2020-02-10: 1000 mg via ORAL
  Filled 2020-02-10: qty 2

## 2020-02-10 MED ORDER — FAMOTIDINE 20 MG PO TABS
20.0000 mg | ORAL_TABLET | Freq: Every day | ORAL | Status: DC
  Administered 2020-02-10: 20 mg via ORAL
  Filled 2020-02-10: qty 1

## 2020-02-10 MED ORDER — OXYCODONE HCL 5 MG PO TABS
5.0000 mg | ORAL_TABLET | ORAL | Status: DC | PRN
  Administered 2020-02-10: 5 mg via ORAL
  Filled 2020-02-10: qty 1

## 2020-02-10 MED ORDER — TAMSULOSIN HCL 0.4 MG PO CAPS
0.4000 mg | ORAL_CAPSULE | Freq: Every day | ORAL | Status: DC
  Administered 2020-02-10 – 2020-02-11 (×2): 0.4 mg via ORAL
  Filled 2020-02-10 (×2): qty 1

## 2020-02-10 MED ORDER — LEVOTHYROXINE SODIUM 50 MCG PO TABS
175.0000 ug | ORAL_TABLET | ORAL | Status: DC
  Administered 2020-02-10 – 2020-02-11 (×2): 175 ug via ORAL
  Filled 2020-02-10 (×2): qty 1
  Filled 2020-02-10: qty 4

## 2020-02-10 MED ORDER — CYCLOSPORINE 0.05 % OP EMUL
1.0000 [drp] | Freq: Two times a day (BID) | OPHTHALMIC | Status: DC
  Administered 2020-02-10 – 2020-02-11 (×2): 1 [drp] via OPHTHALMIC
  Filled 2020-02-10 (×6): qty 1

## 2020-02-10 NOTE — ED Notes (Signed)
Assumed care of patient, report feeling much better than she did yesterday. VSS, awaiting meds to be verified. Patient to be started on heparin due to positive second trop. Patient awaiting ed status.

## 2020-02-10 NOTE — Consult Note (Signed)
ANTICOAGULATION CONSULT NOTE - Initial Consult  Pharmacy Consult for Heparin drip Indication: chest pain/ACS  Allergies  Allergen Reactions  . Tape Rash    Pads used for ablation left a rash for a couple of days.    Patient Measurements: Height: 5\' 2"  (157.5 cm) Weight: 98.9 kg (218 lb) IBW/kg (Calculated) : 50.1 Heparin Dosing Weight: 73.5kg  Vital Signs: BP: 119/61 (06/14 0712) Pulse Rate: 79 (06/14 0712)  Labs: Recent Labs    02/09/20 1817 02/10/20 0359  HGB 13.7  --   HCT 41.0  --   PLT 256  --   CREATININE 0.62 0.63  TROPONINIHS 12 208*    Estimated Creatinine Clearance: 70.9 mL/min (by C-G formula based on SCr of 0.63 mg/dL).   Medical History: Past Medical History:  Diagnosis Date  . Arthritis   . Depression   . Dyspnea   . Dysrhythmia   . Fibromyalgia 1992  . GERD (gastroesophageal reflux disease)   . Hepatitis    Hepatitis B 1985  . Mitral valve prolapse   . Sleep apnea    CPAP  . Tendon tear, ankle    rt foot  . Thyroid dysfunction     Medications:  Pt on apixiban 5mg  bid prior to admission and last dose as inpatient given 6/14 @ 0356  Assessment: 69 yo female with increased troponin's - pharmacy has been consulted to initiate heparin drip for ACS/STEMI.    Goal of Therapy:  aPTT 66-102 seconds Monitor platelets by anticoagulation protocol: Yes   Plan:  Will start heparin drip without bolus per proximity to last Eliquis dose @ 1000 units/hr.  Will obtain baseline labs and recheck APTT in 8 hours after starting heparin drip per protocol.  Lu Duffel, PharmD, BCPS Clinical Pharmacist 02/10/2020 8:07 AM

## 2020-02-10 NOTE — ED Notes (Signed)
Patient awaiting bed status. Vss. Denies pain or discomforts. Bed assigned will call report to receiving nurse. Heparin infusing. Will monitor.

## 2020-02-10 NOTE — Clinical Social Work Note (Signed)
Pt states she has a scale at home. Pt was informed to weight herself daily. Pt is now aware two pounds over night or 5 in one week for her to call her Dr, as this could indicate fluid overload. Pt sees a Film/video editor at [facility]. No additional needs at this time.   [place], [place]

## 2020-02-10 NOTE — H&P (Deleted)
  The note originally documented on this encounter has been moved the the encounter in which it belongs.  

## 2020-02-10 NOTE — Consult Note (Signed)
Urology Consult  I have been asked to see the patient by Dr. Dwyane Dee, for evaluation and management of a 7 mm right proximal ureteral stone with right hydronephrosis.  Chief Complaint: Gross hematuria, right flank pain, nausea, vomiting  History of Present Illness: Mandy Jones is a 69 y.o. year old female with PMH A. fib on Eliquis who presented to the ED overnight reporting a 4-day history urinary symptoms that began with gross hematuria, followed by right flank pain, followed by nausea and vomiting.  She was seen at Urgent Care 3 days ago for reports of gross hematuria.  UA at that time notable for microscopic hematuria alone.  She was started on Cipro 250 mg twice daily x5 days for management of presumed UTI.  Urine culture ultimately resulted negative.  ED CTAP with contrast revealed a 7 mm proximal right ureteral stone with moderate upstream hydronephrosis and perinephric edema.  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  Renal protocol MRI recommended for further evaluation following resolution of acute event.  Notably, patient was found to be hypoxemic to 78% in the ED with CXR findings of pulmonary vascular congestion.  She is being treated with Lasix and admitted for management of CHF exacerbation with elevated troponin deemed likely demand ischemia.  She denies a history of nephrolithiasis.  Last Eliquis this morning around 3 AM, now holding per cardiology recommendations.  Patient reports resolution of flank pain and nausea in the ED.  No acute concerns.  She is curious about ESWL as treatment for her stone.  Past Medical History:  Diagnosis Date  . Arthritis   . Depression   . Dyspnea   . Dysrhythmia   . Fibromyalgia 1992  . GERD (gastroesophageal reflux disease)   . Hepatitis    Hepatitis B 1985  . Mitral valve  prolapse   . Sleep apnea    CPAP  . Tendon tear, ankle    rt foot  . Thyroid dysfunction     Past Surgical History:  Procedure Laterality Date  . ACHILLES TENDON SURGERY Right 04/06/2018   Procedure: ACHILLES TENDON REPAIR;  Surgeon: [name], DPM;  Location: [facility];  Service: Podiatry;  Laterality: Right;  . CARDIOVERSION N/A 02/06/2018   Procedure: CARDIOVERSION;  Surgeon: Corey Skains, MD;  Location: [facility];  Service: Cardiovascular;  Laterality: N/A;  . CARDIOVERSION N/A 03/28/2018   Procedure: CARDIOVERSION;  Surgeon: Corey Skains, MD;  Location: [facility];  Service: Cardiovascular;  Laterality: N/A;  . CARDIOVERSION N/A 05/09/2018   Procedure: CARDIOVERSION;  Surgeon: Corey Skains, MD;  Location: [facility];  Service: Cardiovascular;  Laterality: N/A;  . CARDIOVERSION N/A 10/23/2019   Procedure: CARDIOVERSION;  Surgeon: Corey Skains, MD;  Location: [facility];  Service: Cardiovascular;  Laterality: N/A;  . CHOLECYSTECTOMY    . CONTINUOUS NERVE MONITORING N/A 08/03/2017   Procedure: LARYNGEAL NERVE MONITORING;  Surgeon: Carloyn Manner, MD;  Location: [facility];  Service: ENT;  Laterality: N/A;  . EYE SURGERY Bilateral   . FOOT SURGERY Left 2000  . JOINT REPLACEMENT     left knee  . OSTECTOMY Right 04/06/2018   Procedure: OSTECTOMY-HAGLUNDS/RECTROCALCANEAL;  Surgeon: [name], DPM;  Location: [facility];  Service: Podiatry;  Laterality: Right;  . SHOULDER ARTHROSCOPY Bilateral   . SHOULDER ARTHROSCOPY Left   . THYROIDECTOMY N/A 08/03/2017   Procedure: THYROIDECTOMY;  Surgeon: Carloyn Manner, MD;  Location: [facility];  Service: ENT;  Laterality: N/A;  . TONSILLECTOMY    . TUBAL LIGATION      Home Medications:  Current Meds  Medication Sig  . acetaminophen (TYLENOL) 325 MG tablet Take 325 mg by mouth every 6 (six) hours as needed.  Marland Kitchen albuterol (VENTOLIN HFA) 108 (90 Base) MCG/ACT inhaler Inhale 1-2 puffs into the lungs every 6 (six) hours as needed  for wheezing or shortness of breath.  Marland Kitchen apixaban (ELIQUIS) 5 MG TABS tablet Take 1 tablet (5 mg total) by mouth 2 (two) times daily.  . Ascorbic Acid (VITAMIN C) 1000 MG tablet Take 1,000 mg by mouth every evening.  Marland Kitchen azelastine (ASTELIN) 0.1 % nasal spray Place 1 spray into both nostrils daily as needed for rhinitis or allergies.   . B Complex-C (B-COMPLEX WITH VITAMIN C) tablet Take 1 tablet by mouth daily.  . cholecalciferol (VITAMIN D) 25 MCG (1000 UNIT) tablet Take 1,000 Units by mouth at bedtime.  Marland Kitchen co-enzyme Q-10 30 MG capsule Take 30 mg by mouth at bedtime.  . cycloSPORINE (RESTASIS) 0.05 % ophthalmic emulsion Place 1 drop into both eyes 2 (two) times daily.   . famotidine (PEPCID) 20 MG tablet Take 40 mg by mouth at bedtime.  . furosemide (LASIX) 40 MG tablet Take 40 mg by mouth daily.  . hydrOXYzine (ATARAX/VISTARIL) 25 MG tablet Take 25-50 mg by mouth daily as needed for anxiety.  Marland Kitchen levothyroxine (SYNTHROID) 150 MCG tablet Take 150 mcg by mouth every Sunday. On an empty stomach  . levothyroxine (SYNTHROID) 175 MCG tablet Take 175 mcg by mouth See admin instructions. Take 1 tablet (175 mcg) by mouth on Mondays, Tuesdays, Wednesdays, Thursdays, Fridays, & Saturdays in the morning on a empty stomach.  . Melatonin 5 MG TABS Take 5 mg by mouth at bedtime as needed (sleep).   . mirtazapine (REMERON) 15 MG tablet Take 15 mg by mouth at bedtime as needed for sleep or pain.  . Multiple Vitamins-Minerals (PRESERVISION AREDS 2+MULTI VIT) CAPS Take 1 capsule by mouth in the morning and at bedtime.  Marland Kitchen oxyCODONE (OXY IR/ROXICODONE) 5 MG immediate release tablet Take 1-2 tablets by mouth every 4 (four) hours as needed for pain.  . traZODone (DESYREL) 50 MG tablet Take 100 mg by mouth at bedtime as needed for sleep.   . valACYclovir (VALTREX) 1000 MG tablet Take 2,000 mg by mouth 2 (two) times daily.    Allergies:  Allergies  Allergen Reactions  . Tape Rash    Pads used for ablation left a rash  for a couple of days.    No family history on file.  Social History:  reports that she has quit smoking. She has never used smokeless tobacco. She reports current alcohol use. She reports previous drug use. Drug: Marijuana.  ROS: A complete review of systems was performed.  All systems are negative except for pertinent findings as noted.  Physical Exam:  Vital signs in last 24 hours: Temp:  [97.8 F (36.6 C)] 97.8 F (36.6 C) (06/13 1814) Pulse Rate:  [59-99] 79 (06/14 0818) Resp:  [12-36] 18 (06/14 0818) BP: (108-178)/(61-97) 115/65 (06/14 0730) SpO2:  [83 %-100 %] 97 % (06/14 0820) Weight:  [98.9 kg] 98.9 kg (06/13 1815) Constitutional:  Alert and oriented, no acute distress HEENT: [place] AT, moist mucus membranes Cardiovascular: No clubbing, cyanosis, or edema Respiratory: Normal respiratory effort, on nasal cannula Skin: No rashes, bruises or suspicious lesions Neurologic: Grossly intact, no  focal deficits, moving all 4 extremities Psychiatric: Normal mood and affect  Laboratory Data:  Recent Labs    02/09/20 1817  WBC 8.4  HGB 13.7  HCT 41.0   Recent Labs    02/09/20 1817 02/10/20 0359  NA 141 142  K 3.7 3.6  CL 106 104  CO2 23 28  GLUCOSE 125* 129*  BUN 14 12  CREATININE 0.62 0.63  CALCIUM 9.3 9.2   Urinalysis    Component Value Date/Time   COLORURINE YELLOW (A) 02/09/2020 2040   APPEARANCEUR HAZY (A) 02/09/2020 2040   LABSPEC 1.020 02/09/2020 2040   PHURINE 7.0 02/09/2020 2040   GLUCOSEU NEGATIVE 02/09/2020 2040   HGBUR LARGE (A) 02/09/2020 2040   BILIRUBINUR NEGATIVE 02/09/2020 2040   KETONESUR 80 (A) 02/09/2020 2040   PROTEINUR NEGATIVE 02/09/2020 2040   NITRITE NEGATIVE 02/09/2020 2040   LEUKOCYTESUR NEGATIVE 02/09/2020 2040   Results for orders placed or performed during the hospital encounter of 02/09/20  SARS Coronavirus 2 by RT PCR (hospital order, performed in [facility] hospital lab) Nasopharyngeal Nasopharyngeal Swab     Status: None    Collection Time: 02/09/20 10:50 PM   Specimen: Nasopharyngeal Swab  Result Value Ref Range Status   SARS Coronavirus 2 NEGATIVE NEGATIVE Final    Comment: (NOTE) SARS-CoV-2 target nucleic acids are NOT DETECTED.  The SARS-CoV-2 RNA is generally detectable in upper and lower respiratory specimens during the acute phase of infection. The lowest concentration of SARS-CoV-2 viral copies this assay can detect is 250 copies / mL. A negative result does not preclude SARS-CoV-2 infection and should not be used as the sole basis for treatment or other patient management decisions.  A negative result may occur with improper specimen collection / handling, submission of specimen other than nasopharyngeal swab, presence of viral mutation(s) within the areas targeted by this assay, and inadequate number of viral copies (<250 copies / mL). A negative result must be combined with clinical observations, patient history, and epidemiological information.  Fact Sheet for Patients:   StrictlyIdeas.no  Fact Sheet for Healthcare Providers: BankingDealers.co.za  This test is not yet approved or  cleared by the [place] FDA and has been authorized for detection and/or diagnosis of SARS-CoV-2 by FDA under an Emergency Use Authorization (EUA).  This EUA will remain in effect (meaning this test can be used) for the duration of the COVID-19 declaration under Section 564(b)(1) of the Act, 21 U.S.C. section 360bbb-3(b)(1), unless the authorization is terminated or revoked sooner.  Performed at [facility], [address] [place], [place] [address]     Radiologic Imaging: CT ABDOMEN PELVIS W CONTRAST  Result Date: 02/09/2020 CLINICAL DATA:  Abdominal distension. Right flank pain. Nausea and vomiting. EXAM: CT ABDOMEN AND PELVIS WITH CONTRAST TECHNIQUE: Multidetector CT imaging of the abdomen and pelvis was performed using the standard protocol  following bolus administration of intravenous contrast. CONTRAST:  152mL OMNIPAQUE IOHEXOL 300 MG/ML  SOLN COMPARISON:  None. FINDINGS: Lower chest: Multi chamber cardiomegaly. Subsegmental atelectasis in both lower lobes, left greater than right. No pleural fluid. Hepatobiliary: No focal hepatic abnormality. Post cholecystectomy with mild intra and extrahepatic biliary ductal prominence. Pancreas: No ductal dilatation or inflammation. Spleen: Normal in size without focal abnormality. Adrenals/Urinary Tract: Nodular thickening of the left adrenal gland without dominant nodule. Normal right adrenal gland. Obstructing 6 x 7 mm stone in the right proximal ureter with moderate hydronephrosis and perinephric edema. There is delayed excretion from the right kidney on delayed phase  imaging. Possible punctate nonobstructing stone in the lower right kidney. The more distal ureter is decompressed. No left hydronephrosis. No evidence of left renal calculi. Fat density lesion in the upper left kidney measuring 19 mm consistent with angiomyolipoma. There is an adjacent 8 mm possible enhancing lesion in the left upper kidney, series 2, image 24, too small to characterize. Additional small low-density lesions in the left kidney are too small to accurately characterize. Partially distended urinary bladder. No bladder wall thickening. No bladder stone. Stomach/Bowel: Small hiatal hernia. Small duodenal diverticulum. No small bowel obstruction or inflammation. Normal appendix. Slightly high-riding cecum in the right mid abdomen. Moderate stool burden in the colon. Distal diverticulosis without diverticulitis. Vascular/Lymphatic: Mild aortic atherosclerosis and tortuosity. No aortic aneurysm. Patent portal vein. Scattered retroperitoneal and ileocolic lymph nodes are likely reactive. Reproductive: Lobulated uterine contours with multiple fibroids, some of which are calcified. 15 mm cyst in the left ovary. Right ovary not confidently  visualized. No suspicious adnexal mass. Other: No ascites or free air. Tiny fat containing umbilical hernia. Musculoskeletal: Degenerative disc disease at L3-L4 with mild anterolisthesis. IMPRESSION: 1. Obstructing 6 x 7 mm stone in the right proximal ureter with moderate hydronephrosis and perinephric edema. 2. Possible additional punctate nonobstructing stone in the lower right kidney. 3. 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 There is an adjacent 8 mm possible enhancing lesion in the left upper kidney, too small to characterize. Consider nonemergent characterization with renal protocol MRI on an elective basis after resolution of acute event. 4. Uterine fibroids. 5. Small hiatal hernia. Distal colonic diverticulosis without diverticulitis. Aortic Atherosclerosis (ICD10-I70.0). Electronically Signed   By: Keith Rake M.D.   On: 02/09/2020 22:04   Assessment & Plan:  70 year old female with PMH A. fib on Eliquis most recently this a.m. admitted for right renal colic in the setting of a 7 mm proximal right ureteral stone and subsequently found to have an acute CHF exacerbation.  UA and recent culture reassuring for infection.  Pain and nausea responsive to IV medications in the ED.  Creatinine WNL.  Recommend conservative management with pain control and antiemetics for now.  Recommend KUB today to evaluate stone for radiopacity; if visualizable on KUB, she may be a candidate for ESWL later this week.  That said, given stone location, she will need to remain off Eliquis in the interim.  Would recommend cardiac clearance in advance.  Recommendations: -KUB today -Continue to hold Eliquis -She will require outpatient follow-up to arrange repeat imaging of possibly enhancing left renal lesion  Thank you for involving me in this patient's care, I will continue to follow along.  Debroah Loop, PA-C 02/10/2020 9:28 AM

## 2020-02-10 NOTE — ED Notes (Signed)
SECOND ATTEMPT TO CALL REPORT NURSE UNABLE TO TAKE REPORT AT PRESENT TIME WILL CALL NURSE BACK FOR REPORT.

## 2020-02-10 NOTE — H&P (Signed)
History and Physical    Ziah Turvey [facility] OAC:[id number] DOB: Jul 31, 1951 DOA: 02/09/2020  PCP: Elaina Pattee, MD   Patient coming from: Home  I have personally briefly reviewed patient's old medical records in [place]  Chief Complaint: Right flank pain HPI: Mandy Jones is a 69 y.o. female with medical history significant for paroxysmal A. fib status post recent ablation, on apixaban and followed by Dr. Nehemiah Massed, history of surgical hypothyroidism, recently diagnosed with UTI treated with ciprofloxacin who presents to the emergency room with 1 day onset of right colicky flank pain of moderate to severe intensity radiating to right groin, associated with intermittent vomiting.  She has chills but no fever.  Worse with movements.  No alleviating factors.  She denies chest pain or shortness of breath  ED Course: On arrival she was afebrile with vitals within normal limits.  Blood work initially was unremarkable.  Urinalysis showed no infection but did show red blood cells.  She had a CT abdomen that showed 6-7 in the medial right obstructing stone with moderate hydronephrosis.  Patient was treated with antiemetics and pain medication and while being prepared for discharge, was noted to be wheezing and short winded as she walked back from the commode. O2 sat with ambulation was 78%.  She subsequently had a chest x-ray that showed pulmonary vascular congestion.  She was administered a dose of Lasix.  Hospitalist consulted for admission.  BNP returned at 232.  Troponin and EKG pending at time of decision to admit  Review of Systems: As per HPI otherwise 10 point review of systems negative.    Past Medical History:  Diagnosis Date  . Arthritis   . Depression   . Dyspnea   . Dysrhythmia   . Fibromyalgia 1992  . GERD (gastroesophageal reflux disease)   . Hepatitis    Hepatitis B 1985  . Mitral valve prolapse   . Sleep apnea    CPAP  . Tendon tear, ankle    rt foot    . Thyroid dysfunction     Past Surgical History:  Procedure Laterality Date  . ACHILLES TENDON SURGERY Right 04/06/2018   Procedure: ACHILLES TENDON REPAIR;  Surgeon: [name], DPM;  Location: [facility] OR[facility];  Service: Podiatry;  Laterality: Right;  . CARDIOVERSION N/A 02/06/2018   Procedure: CARDIOVERSION;  Surgeon: Corey Skains, MD;  Location: [facility];  Service: Cardiovascular;  Laterality: N/A;  . CARDIOVERSION N/A 03/28/2018   Procedure: CARDIOVERSION;  Surgeon: Corey Skains, MD;  Location: [facility];  Service: Cardiovascular;  Laterality: N/A;  . CARDIOVERSION N/A 05/09/2018   Procedure: CARDIOVERSION;  Surgeon: Corey Skains, MD;  Location: [facility];  Service: Cardiovascular;  Laterality: N/A;  . CARDIOVERSION N/A 10/23/2019   Procedure: CARDIOVERSION;  Surgeon: Corey Skains, MD;  Location: [facility];  Service: Cardiovascular;  Laterality: N/A;  . CHOLECYSTECTOMY    . CONTINUOUS NERVE MONITORING N/A 08/03/2017   Procedure: LARYNGEAL NERVE MONITORING;  Surgeon: Carloyn Manner, MD;  Location: [facility];  Service: ENT;  Laterality: N/A;  . EYE SURGERY Bilateral   . FOOT SURGERY Left 2000  . JOINT REPLACEMENT     left knee  . OSTECTOMY Right 04/06/2018   Procedure: OSTECTOMY-HAGLUNDS/RECTROCALCANEAL;  Surgeon: [name], DPM;  Location: [facility];  Service: Podiatry;  Laterality: Right;  . SHOULDER ARTHROSCOPY Bilateral   . SHOULDER ARTHROSCOPY Left   . THYROIDECTOMY N/A 08/03/2017   Procedure: THYROIDECTOMY;  Surgeon: Carloyn Manner, MD;  Location: [facility];  Service: ENT;  Laterality: N/A;  . TONSILLECTOMY    . TUBAL LIGATION       reports that she has quit smoking. She has never used smokeless tobacco. She reports current alcohol use. She reports previous drug use. Drug: Marijuana.  Allergies  Allergen Reactions  . Latex     No family history on file.   Prior to Admission medications   Medication Sig Start Date End Date Taking? Authorizing Provider   albuterol (VENTOLIN HFA) 108 (90 Base) MCG/ACT inhaler Inhale 1-2 puffs into the lungs every 6 (six) hours as needed for wheezing or shortness of breath.    [provider]  apixaban (ELIQUIS) 5 MG TABS tablet Take 1 tablet (5 mg total) by mouth 2 (two) times daily. 12/15/17   Nicholes Mango, MD  Ascorbic Acid (VITAMIN C) 1000 MG tablet Take 1,000 mg by mouth every evening.    [provider]  atorvastatin (LIPITOR) 10 MG tablet Take 10 mg by mouth daily. 05/05/17   [provider]  azelastine (ASTELIN) 0.1 % nasal spray Place 1 spray into both nostrils daily as needed for rhinitis or allergies.     [provider]  B Complex-C (B-COMPLEX WITH VITAMIN C) tablet Take 1 tablet by mouth daily.    [provider]  buPROPion (WELLBUTRIN XL) 300 MG 24 hr tablet Take 300 mg by mouth daily.  05/10/17   [provider]  cholecalciferol (VITAMIN D) 25 MCG (1000 UNIT) tablet Take 1,000 Units by mouth at bedtime.    [provider]  cycloSPORINE (RESTASIS) 0.05 % ophthalmic emulsion Place 1 drop into both eyes 2 (two) times daily.     [provider]  diltiazem (CARDIZEM CD) 120 MG 24 hr capsule Take 120 mg by mouth daily. 10/04/19   [provider]  famotidine (PEPCID) 20 MG tablet Take 40 mg by mouth at bedtime. 10/09/19   [provider]  flecainide (TAMBOCOR) 100 MG tablet Take 100 mg by mouth 2 (two) times daily. 10/04/19   [provider]  fluticasone (FLONASE) 50 MCG/ACT nasal spray Place 1-2 sprays into both nostrils daily as needed for allergies or rhinitis.    [provider]  furosemide (LASIX) 40 MG tablet Take 40 mg by mouth daily. 10/04/19   [provider]  hydrOXYzine (ATARAX/VISTARIL) 25 MG tablet Take 25-50 mg by mouth daily as needed for anxiety. 06/21/19   [provider]  levothyroxine (SYNTHROID) 150 MCG tablet Take 150 mcg by mouth every Sunday. On an empty stomach 10/15/19    [provider]  levothyroxine (SYNTHROID) 175 MCG tablet Take 175 mcg by mouth See admin instructions. Take 1 tablet (175 mcg) by mouth on Mondays, Tuesdays, Wednesdays, Thursdays, Fridays, & Saturdays in the morning on a empty stomach. 10/09/19   [provider]  Melatonin 5 MG TABS Take 5 mg by mouth at bedtime as needed (sleep).     [provider]  mirtazapine (REMERON) 15 MG tablet Take 15 mg by mouth at bedtime as needed for sleep or pain. 10/03/19   [provider]  Multiple Vitamins-Minerals (PRESERVISION AREDS 2+MULTI VIT) CAPS Take 1 capsule by mouth in the morning and at bedtime.    [provider]  traZODone (DESYREL) 50 MG tablet Take 100 mg by mouth at bedtime as needed for sleep.  12/04/17   [provider]    Physical Exam: Vitals:   02/09/20 1815 02/09/20 2143 02/09/20 2230 02/09/20 2300  BP:  Marland Kitchen)  160/97 (!) 178/95 (!) 144/80  Pulse:  87 91 80  Resp:  18 (!) 22 (!) 24  Temp:      TempSrc:      SpO2:  93% (!) 89% 95%  Weight: 98.9 kg     Height: 5\' 2"  (1.575 m)        Vitals:   02/09/20 1815 02/09/20 2143 02/09/20 2230 02/09/20 2300  BP:  (!) 160/97 (!) 178/95 (!) 144/80  Pulse:  87 91 80  Resp:  18 (!) 22 (!) 24  Temp:      TempSrc:      SpO2:  93% (!) 89% 95%  Weight: 98.9 kg     Height: 5\' 2"  (1.575 m)         Constitutional: Alert and oriented x 3 . Not in any apparent distress HEENT:      Head: Normocephalic and atraumatic.         Eyes: PERLA, EOMI, Conjunctivae are normal. Sclera is non-icteric.       Mouth/Throat: Mucous membranes are moist.       Neck: Supple with no signs of meningismus. Cardiovascular: Regular rate and rhythm. No murmurs, gallops, or rubs. 2+ symmetrical distal pulses are present . No JVD. No LE edema Respiratory: Respiratory effort normal .Lungs sounds with few bibasilar rales gastrointestinal: Soft, non tender, and non distended with positive bowel sounds. No rebound or  guarding. Genitourinary: right CVA tenderness. Musculoskeletal: Nontender with normal range of motion in all extremities. No edema, cyanosis, or erythema of extremities. Neurologic: Normal speech and language. Face is symmetric. Moving all extremities. No gross focal neurologic deficits . Skin: Skin is warm, dry.  No rash or ulcers Psychiatric: Mood and affect are normal Speech and behavior are normal   Labs on Admission: I have personally reviewed following labs and imaging studies  CBC: Recent Labs  Lab 02/09/20 1817  WBC 8.4  HGB 13.7  HCT 41.0  MCV 86.0  PLT 453   Basic Metabolic Panel: Recent Labs  Lab 02/09/20 1817  NA 141  K 3.7  CL 106  CO2 23  GLUCOSE 125*  BUN 14  CREATININE 0.62  CALCIUM 9.3   GFR: Estimated Creatinine Clearance: 70.9 mL/min (by C-G formula based on SCr of 0.62 mg/dL). Liver Function Tests: No results for input(s): AST, ALT, ALKPHOS, BILITOT, PROT, ALBUMIN in the last 168 hours. No results for input(s): LIPASE, AMYLASE in the last 168 hours. No results for input(s): AMMONIA in the last 168 hours. Coagulation Profile: No results for input(s): INR, PROTIME in the last 168 hours. Cardiac Enzymes: No results for input(s): CKTOTAL, CKMB, CKMBINDEX, TROPONINI in the last 168 hours. BNP (last 3 results) No results for input(s): PROBNP in the last 8760 hours. HbA1C: No results for input(s): HGBA1C in the last 72 hours. CBG: No results for input(s): GLUCAP in the last 168 hours. Lipid Profile: No results for input(s): CHOL, HDL, LDLCALC, TRIG, CHOLHDL, LDLDIRECT in the last 72 hours. Thyroid Function Tests: No results for input(s): TSH, T4TOTAL, FREET4, T3FREE, THYROIDAB in the last 72 hours. Anemia Panel: No results for input(s): VITAMINB12, FOLATE, FERRITIN, TIBC, IRON, RETICCTPCT in the last 72 hours. Urine analysis:    Component Value Date/Time   COLORURINE YELLOW (A) 02/09/2020 2040   APPEARANCEUR HAZY (A) 02/09/2020 2040   LABSPEC  1.020 02/09/2020 2040   PHURINE 7.0 02/09/2020 2040   GLUCOSEU NEGATIVE 02/09/2020 2040   HGBUR LARGE (A) 02/09/2020 2040   BILIRUBINUR NEGATIVE 02/09/2020 2040  KETONESUR 80 (A) 02/09/2020 2040   PROTEINUR NEGATIVE 02/09/2020 2040   NITRITE NEGATIVE 02/09/2020 2040   LEUKOCYTESUR NEGATIVE 02/09/2020 2040    Radiological Exams on Admission: CT ABDOMEN PELVIS W CONTRAST  Result Date: 02/09/2020 CLINICAL DATA:  Abdominal distension. Right flank pain. Nausea and vomiting. EXAM: CT ABDOMEN AND PELVIS WITH CONTRAST TECHNIQUE: Multidetector CT imaging of the abdomen and pelvis was performed using the standard protocol following bolus administration of intravenous contrast. CONTRAST:  128mL OMNIPAQUE IOHEXOL 300 MG/ML  SOLN COMPARISON:  None. FINDINGS: Lower chest: Multi chamber cardiomegaly. Subsegmental atelectasis in both lower lobes, left greater than right. No pleural fluid. Hepatobiliary: No focal hepatic abnormality. Post cholecystectomy with mild intra and extrahepatic biliary ductal prominence. Pancreas: No ductal dilatation or inflammation. Spleen: Normal in size without focal abnormality. Adrenals/Urinary Tract: Nodular thickening of the left adrenal gland without dominant nodule. Normal right adrenal gland. Obstructing 6 x 7 mm stone in the right proximal ureter with moderate hydronephrosis and perinephric edema. There is delayed excretion from the right kidney on delayed phase imaging. Possible punctate nonobstructing stone in the lower right kidney. The more distal ureter is decompressed. No left hydronephrosis. No evidence of left renal calculi. Fat density lesion in the upper left kidney measuring 19 mm consistent with angiomyolipoma. There is an adjacent 8 mm possible enhancing lesion in the left upper kidney, series 2, image 24, too small to characterize. Additional small low-density lesions in the left kidney are too small to accurately characterize. Partially distended urinary bladder. No  bladder wall thickening. No bladder stone. Stomach/Bowel: Small hiatal hernia. Small duodenal diverticulum. No small bowel obstruction or inflammation. Normal appendix. Slightly high-riding cecum in the right mid abdomen. Moderate stool burden in the colon. Distal diverticulosis without diverticulitis. Vascular/Lymphatic: Mild aortic atherosclerosis and tortuosity. No aortic aneurysm. Patent portal vein. Scattered retroperitoneal and ileocolic lymph nodes are likely reactive. Reproductive: Lobulated uterine contours with multiple fibroids, some of which are calcified. 15 mm cyst in the left ovary. Right ovary not confidently visualized. No suspicious adnexal mass. Other: No ascites or free air. Tiny fat containing umbilical hernia. Musculoskeletal: Degenerative disc disease at L3-L4 with mild anterolisthesis. IMPRESSION: 1. Obstructing 6 x 7 mm stone in the right proximal ureter with moderate hydronephrosis and perinephric edema. 2. Possible additional punctate nonobstructing stone in the lower right kidney. 3. 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 There is an adjacent 8 mm possible enhancing lesion in the left upper kidney, too small to characterize. Consider nonemergent characterization with renal protocol MRI on an elective basis after resolution of acute event. 4. Uterine fibroids. 5. Small hiatal hernia. Distal colonic diverticulosis without diverticulitis. Aortic Atherosclerosis (ICD10-I70.0). Electronically Signed   By: Keith Rake M.D.   On: 02/09/2020 22:04   DG Chest Portable 1 View  Result Date: 02/09/2020 CLINICAL DATA:  Shortness of breath EXAM: PORTABLE CHEST 1 VIEW COMPARISON:  12/14/2017 FINDINGS: Cardiac shadow is enlarged but stable. Diffuse increased vascular congestion is noted with mild interstitial edema consistent with congestive failure. No focal infiltrate or sizable effusion is noted. No bony abnormality is seen. IMPRESSION: Changes of mild CHF. Electronically Signed   By:  Inez Catalina M.D.   On: 02/09/2020 23:06    EKG: Independently reviewed.   Assessment/Plan Principal Problem:   Acute diastolic CHF (congestive heart failure) (HCC)   Acute respiratory failure with hypoxia ([place]) -Patient presents with shortness of breath that started while being evaluated for right flank pain in the emergency room, with associated hypoxia, O2  sats 78% on room air with ambulation -Work-up consistent with heart failure with BNP 232 and chest x-ray showing pulmonary vascular congestion -Patient follows with cardiologist, Dr. Nehemiah Massed and had recent ablation for her paroxysmal A. fib and has been doing well -Last echocardiogram on file from 2019 showed EF 55% -IV Lasix 40 mg twice daily -Daily weights with salt and fluid restriction, intake and output monitoring -Echocardiogram in the a.m. -Trend troponins to evaluate for ACS -Cardiology consult  Acute pyelonephritis -Patient recently treated with Cipro for UTI.  Diagnosed at urgent care -Perinephric stranding seen on CT in conjunction with hydronephrosis -Empiric Rocephin -Urine culture    Hydronephrosis with renal and ureteral calculus obstruction -CT abdomen and pelvis showing 6 to 7 mm obstructing stone with moderate hydronephrosis -Flomax -Urology consult in the a.m.  Rocephin -Continue home levothyroxine pending med rec    AF (paroxysmal atrial fibrillation) (HCC) s/p ablation -Follow-up EKG.  Patient not tachycardic in the ER -Status post ablation recently   DVT prophylaxis: Apixaban Code Status: full code  Family Communication:  none  Disposition Plan: Back to previous home environment Consults called: Cardiology, urology Status:obs    Athena Masse MD [facility]     02/10/2020, 12:14 AM

## 2020-02-10 NOTE — ED Notes (Signed)
Patient comfortablyawaiting bed status. Vbss. Denies sob/cp or any other concerns. Vss. Will continue to monitor.

## 2020-02-10 NOTE — ED Notes (Signed)
Echo completed at bedside. Patient denies pain or discomforts. 948ml urine output documented. Vss. Scheduled meds given, heparin infusing. Safety maintained. Awaiting bed status.

## 2020-02-10 NOTE — Consult Note (Signed)
CARDIOLOGY CONSULT NOTE               Patient ID: Mandy Jones MRN: [id number] DOB/AGE: February 22, 1951 69 y.o.  Admit date: 02/09/2020 Referring Physician Dr. Judd Gaudier  Primary Physician Dr. Elaina Pattee Primary Cardiologist Dr. Nehemiah Massed  Reason for Consultation Acute CHF, elevated troponin  HPI: Mandy Jones is a 69 year old female with a past medical history significant for longstanding persistent atrial fibrillation s/p ablation in 2019 and most recent cardioversion on 10/23/19, anticoagulated with Eliquis, hypothyroidism, obstructive sleep apnea, compliant with CPAP, hyperlipidemia, and hypertension who presented to the ED on 02/09/20 for right flank pain with associated chills and vomiting.  Workup in the ED has been significant for CT abdomen revealing a 6-7 mm obstructive stone in the right proximal ureter with moderate hydronephrosis, chest xray revealing mild interstitial edema, BNP of 232, TSH of 9.3, and high sensitivity troponin 12, 208 respectively.  She is currently asymptomatic and denies chest pain, shortness of breath, palpitations, lower extremity swelling, orthopnea, PND, or syncopal/presyncopal episodes.   She is followed in outpatient cardiology by Dr. Nehemiah Massed.  Stress test on 10/10/19 revealed moderately reduced LV systolic function, an EF of 41%, with global hypokinesis - in atrial fibrillation the duration of the study.  Echocardiogram on 12/03/18 revealed low normal LV systolic function, an EF of 50%, with normal RV systolic function and mild MR, TR, and PR.   Review of systems complete and found to be negative unless listed above     Past Medical History:  Diagnosis Date  . Arthritis   . Depression   . Dyspnea   . Dysrhythmia   . Fibromyalgia 1992  . GERD (gastroesophageal reflux disease)   . Hepatitis    Hepatitis B 1985  . Mitral valve prolapse   . Sleep apnea    CPAP  . Tendon tear, ankle    rt foot  . Thyroid dysfunction     Past  Surgical History:  Procedure Laterality Date  . ACHILLES TENDON SURGERY Right 04/06/2018   Procedure: ACHILLES TENDON REPAIR;  Surgeon: [name], DPM;  Location: [facility];  Service: Podiatry;  Laterality: Right;  . CARDIOVERSION N/A 02/06/2018   Procedure: CARDIOVERSION;  Surgeon: Corey Skains, MD;  Location: [facility];  Service: Cardiovascular;  Laterality: N/A;  . CARDIOVERSION N/A 03/28/2018   Procedure: CARDIOVERSION;  Surgeon: Corey Skains, MD;  Location: [facility];  Service: Cardiovascular;  Laterality: N/A;  . CARDIOVERSION N/A 05/09/2018   Procedure: CARDIOVERSION;  Surgeon: Corey Skains, MD;  Location: [facility];  Service: Cardiovascular;  Laterality: N/A;  . CARDIOVERSION N/A 10/23/2019   Procedure: CARDIOVERSION;  Surgeon: Corey Skains, MD;  Location: [facility];  Service: Cardiovascular;  Laterality: N/A;  . CHOLECYSTECTOMY    . CONTINUOUS NERVE MONITORING N/A 08/03/2017   Procedure: LARYNGEAL NERVE MONITORING;  Surgeon: Carloyn Manner, MD;  Location: [facility];  Service: ENT;  Laterality: N/A;  . EYE SURGERY Bilateral   . FOOT SURGERY Left 2000  . JOINT REPLACEMENT     left knee  . OSTECTOMY Right 04/06/2018   Procedure: OSTECTOMY-HAGLUNDS/RECTROCALCANEAL;  Surgeon: [name], DPM;  Location: [facility];  Service: Podiatry;  Laterality: Right;  . SHOULDER ARTHROSCOPY Bilateral   . SHOULDER ARTHROSCOPY Left   . THYROIDECTOMY N/A 08/03/2017   Procedure: THYROIDECTOMY;  Surgeon: Carloyn Manner, MD;  Location: [facility];  Service: ENT;  Laterality: N/A;  . TONSILLECTOMY    . TUBAL LIGATION      (  Not in a hospital admission)  Social History   Socioeconomic History  . Marital status: Divorced    Spouse name: Not on file  . Number of children: 0  . Years of education: Not on file  . Highest education level: Not on file  Occupational History  . Occupation: retired     Comment: Astronomer   Tobacco Use  . Smoking status: Former Research scientist (life sciences)  .  Smokeless tobacco: Never Used  . Tobacco comment: quit 30 years ago  Vaping Use  . Vaping Use: Never used  Substance and Sexual Activity  . Alcohol use: Yes    Comment: occassional  . Drug use: Not Currently    Types: Marijuana  . Sexual activity: Not on file  Other Topics Concern  . Not on file  Social History Narrative   Lives alone: 3 dogs and a Neurosurgeon    Social Determinants of Health   Financial Resource Strain:   . Difficulty of Paying Living Expenses:   Food Insecurity:   . Worried About Charity fundraiser in the Last Year:   . Arboriculturist in the Last Year:   Transportation Needs:   . Film/video editor (Medical):   Marland Kitchen Lack of Transportation (Non-Medical):   Physical Activity:   . Days of Exercise per Week:   . Minutes of Exercise per Session:   Stress:   . Feeling of Stress :   Social Connections:   . Frequency of Communication with Friends and Family:   . Frequency of Social Gatherings with Friends and Family:   . Attends Religious Services:   . Active Member of Clubs or Organizations:   . Attends Archivist Meetings:   Marland Kitchen Marital Status:   Intimate Partner Violence:   . Fear of Current or Ex-Partner:   . Emotionally Abused:   Marland Kitchen Physically Abused:   . Sexually Abused:     No family history on file.    Review of systems complete and found to be negative unless listed above      PHYSICAL EXAM  General: Well developed, well nourished, in no acute distress HEENT:  Normocephalic and atramatic Neck:  No JVD.  Lungs: On 2L [place]. Expiratory crackles noted in lower lung fields bilaterally. Heart: HRRR . Normal S1 and S2 without gallops or murmurs.  Abdomen: Bowel sounds are positive, abdomen soft and non-tender  Msk:  Back normal.  Normal strength and tone for age. Extremities: No clubbing, cyanosis or edema.   Neuro: Alert and oriented X 3. Psych:  Good affect, responds appropriately  Labs:   Lab Results  Component Value Date   WBC 8.4  02/09/2020   HGB 13.7 02/09/2020   HCT 41.0 02/09/2020   MCV 86.0 02/09/2020   PLT 256 02/09/2020    Recent Labs  Lab 02/10/20 0359  NA 142  K 3.6  CL 104  CO2 28  BUN 12  CREATININE 0.63  CALCIUM 9.2  GLUCOSE 129*   Lab Results  Component Value Date   TROPONINI <0.03 12/15/2017   No results found for: CHOL No results found for: HDL No results found for: LDLCALC No results found for: TRIG No results found for: CHOLHDL No results found for: LDLDIRECT    Radiology: CT ABDOMEN PELVIS W CONTRAST  Result Date: 02/09/2020 CLINICAL DATA:  Abdominal distension. Right flank pain. Nausea and vomiting. EXAM: CT ABDOMEN AND PELVIS WITH CONTRAST TECHNIQUE: Multidetector CT imaging of the abdomen and pelvis was performed using  the standard protocol following bolus administration of intravenous contrast. CONTRAST:  11mL OMNIPAQUE IOHEXOL 300 MG/ML  SOLN COMPARISON:  None. FINDINGS: Lower chest: Multi chamber cardiomegaly. Subsegmental atelectasis in both lower lobes, left greater than right. No pleural fluid. Hepatobiliary: No focal hepatic abnormality. Post cholecystectomy with mild intra and extrahepatic biliary ductal prominence. Pancreas: No ductal dilatation or inflammation. Spleen: Normal in size without focal abnormality. Adrenals/Urinary Tract: Nodular thickening of the left adrenal gland without dominant nodule. Normal right adrenal gland. Obstructing 6 x 7 mm stone in the right proximal ureter with moderate hydronephrosis and perinephric edema. There is delayed excretion from the right kidney on delayed phase imaging. Possible punctate nonobstructing stone in the lower right kidney. The more distal ureter is decompressed. No left hydronephrosis. No evidence of left renal calculi. Fat density lesion in the upper left kidney measuring 19 mm consistent with angiomyolipoma. There is an adjacent 8 mm possible enhancing lesion in the left upper kidney, series 2, image 24, too small to  characterize. Additional small low-density lesions in the left kidney are too small to accurately characterize. Partially distended urinary bladder. No bladder wall thickening. No bladder stone. Stomach/Bowel: Small hiatal hernia. Small duodenal diverticulum. No small bowel obstruction or inflammation. Normal appendix. Slightly high-riding cecum in the right mid abdomen. Moderate stool burden in the colon. Distal diverticulosis without diverticulitis. Vascular/Lymphatic: Mild aortic atherosclerosis and tortuosity. No aortic aneurysm. Patent portal vein. Scattered retroperitoneal and ileocolic lymph nodes are likely reactive. Reproductive: Lobulated uterine contours with multiple fibroids, some of which are calcified. 15 mm cyst in the left ovary. Right ovary not confidently visualized. No suspicious adnexal mass. Other: No ascites or free air. Tiny fat containing umbilical hernia. Musculoskeletal: Degenerative disc disease at L3-L4 with mild anterolisthesis. IMPRESSION: 1. Obstructing 6 x 7 mm stone in the right proximal ureter with moderate hydronephrosis and perinephric edema. 2. Possible additional punctate nonobstructing stone in the lower right kidney. 3. 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 There is an adjacent 8 mm possible enhancing lesion in the left upper kidney, too small to characterize. Consider nonemergent characterization with renal protocol MRI on an elective basis after resolution of acute event. 4. Uterine fibroids. 5. Small hiatal hernia. Distal colonic diverticulosis without diverticulitis. Aortic Atherosclerosis (ICD10-I70.0). Electronically Signed   By: Keith Rake M.D.   On: 02/09/2020 22:04   DG Chest Portable 1 View  Result Date: 02/09/2020 CLINICAL DATA:  Shortness of breath EXAM: PORTABLE CHEST 1 VIEW COMPARISON:  12/14/2017 FINDINGS: Cardiac shadow is enlarged but stable. Diffuse increased vascular congestion is noted with mild interstitial edema consistent with congestive  failure. No focal infiltrate or sizable effusion is noted. No bony abnormality is seen. IMPRESSION: Changes of mild CHF. Electronically Signed   By: Inez Catalina M.D.   On: 02/09/2020 23:06    ASSESSMENT AND PLAN:  1.  Acute CHF   -Continue Lasix 40mg  BID; has diuresed 789mL since admission   -Echocardiogram pending; further medication management pending results   -Continue daily weights, I's and O's, low sodium diet    2.  Atrial fibrillation   -Agree to hold Eliquis - urology workup pending; okay to remain on heparin     -Rate well controlled; previously bradycardic on metoprolol and amiodarone   3.  Elevated troponin   -12, 208; in the absence of chest pain and ischemic ECG changes, likely demand ischemia in the setting of acute CHF and hydronephrosis with stone obstruction   -Echocardiogram pending; no plan for further invasive  ischemic workup at this time     The history, physical exam findings, and plan of care were all discussed with Dr. Bartholome Bill, and all decision making was made in collaboration.    Signed: Avie Arenas PA-C 02/10/2020, 7:44 AM

## 2020-02-10 NOTE — Progress Notes (Signed)
Possible NSTEMI Troponin bumped to 208.  No chest pain.  No EKG changes  Start heparin infusion.  No bolus as patient was on apixaban Likely secondary to demand ischemia from increased work of breathing

## 2020-02-10 NOTE — Progress Notes (Signed)
*  PRELIMINARY RESULTS* Echocardiogram 2D Echocardiogram has been performed.  Mandy Jones 02/10/2020, 10:59 AM

## 2020-02-10 NOTE — Progress Notes (Addendum)
[facility] Progress Note  Patient: Mandy Jones    TKZ:[id number]  DOA: 02/09/2020     Date of Service: the patient was seen and examined on 02/10/2020  Chief Complaint  Patient presents with  . Emesis  . Back Pain   Brief hospital course:  Mandy Jones is a 69 y.o. female with medical history significant for paroxysmal A. fib status post recent ablation, on apixaban and followed by Dr. Nehemiah Massed, history of surgical hypothyroidism, recently diagnosed with UTI treated with ciprofloxacin who presents to the emergency room with 1 day onset of right colicky flank pain of moderate to severe intensity radiating to right groin, associated with intermittent vomiting.  She has chills but no fever.  Worse with movements.  No alleviating factors.  She denies chest pain or shortness of breath  ED Course: On arrival she was afebrile with vitals within normal limits.  Blood work initially was unremarkable.  Urinalysis showed no infection but did show red blood cells.  She had a CT abdomen that showed 6-7 in the medial right obstructing stone with moderate hydronephrosis.  Patient was treated with antiemetics and pain medication and while being prepared for discharge, was noted to be wheezing and short winded as she walked back from the commode. O2 sat with ambulation was 78%.  She subsequently had a chest x-ray that showed pulmonary vascular congestion.  She was administered a dose of Lasix.  Hospitalist consulted for admission.  BNP returned at 232.  Troponin and EKG pending at time of decision to admit    Assessment and Plan: Assessment/Plan Principal Problem:   Acute diastolic CHF (congestive heart failure)    Acute respiratory failure with hypoxia ([place]) -Patient presents with shortness of breath that started while being evaluated for right flank pain in the emergency room, with associated hypoxia, O2 sats 78% on room air with ambulation -Work-up consistent with heart failure  with BNP 232 and chest x-ray showing pulmonary vascular congestion -Patient follows with cardiologist, Dr. Nehemiah Massed and had recent ablation for her paroxysmal A. fib and has been doing well -Last echocardiogram on file from 2019 showed EF 55% -IV Lasix 40 mg twice daily -Continue ramipril 2.5 mg -Daily weights with salt and fluid restriction, intake and output monitoring --Elevated troponin most likely demand ischemia due to CHF -Ttroponins 208, 183 trended down,  -Echocardiogram report pending -Cardiology consulted,  Echocardiogram pending    Acute pyelonephritis -Patient recently treated with Cipro for UTI.  Diagnosed at urgent care -Perinephric stranding seen on CT in conjunction with hydronephrosis -Empiric Rocephin -Urine culture    Hydronephrosis with renal and ureteral calculus obstruction -CT abdomen and pelvis showing 6 to 7 mm obstructing stone with moderate hydronephrosis -Flomax -Urology consulted, Rec. conservative management, KUB to evaluate stone for radiopacity, if visualized then patient may be candidate for EWS L, hold Eliquis.   Cardiac clearance in advance -Started Lovenox therapeutic dose, plan is to stop it 1 day before the procedure   Hypothyroid, TSH 9.3 elevated -Continue home levothyroxine, follow with PCP to titrate dose accordingly    AF (paroxysmal atrial fibrillation) (HCC) s/p ablation -Continue to monitor on telemetry  -Hold Eliquis for now --Started Lovenox therapeutic dose in the meantime    Body mass index is 39.87 kg/m.  Interventions:        Diet: Heart healthy DVT Prophylaxis: Subcutaneous Lovenox   Advance goals of care discussion: Full code  Family Communication: no family member was present at bedside, at the time of  interview.  The pt provided permission to discuss medical plan with the family. Opportunity was given to ask question and all questions were answered satisfactorily.   Disposition:  Pt is from Home,  admitted with right ureteral stone with hydronephrosis and congestive heart failure, still has CHF, right ureter stone, needs urology eval, which precludes a safe discharge. Discharge to home, when she will be cleared by urologist and cardiologist, possible in 1 to 2 days if no intervention otherwise patient may need to stay till Friday for cystoscopy and ESWL, depends on the location of ureteral stone.   Subjective: No overnight issues, patient was seen and examined in the ED.  Currently patient is pain-free.  Patient presented with right flank pain radiating to pelvis area and vomiting.  Currently patient is asymptomatic and lying comfortably. Patient had mild shortness of breath, denies any palpitations, no chest pain.   Physical Exam: General:  alert oriented to time, place, and person.  Appear in mild distress, affect appropriate Eyes: PERRLA ENT: Oral Mucosa Clear, moist  Neck: no JVD,  Cardiovascular: S1 and S2 Present, no Murmur,  Respiratory: good respiratory effort, Bilateral Air entry equal and Decreased, mild Crackles, no wheezes Abdomen: Bowel Sound present, Soft and no tenderness,  Skin: no rashes Extremities: no Pedal edema, no calf tenderness Neurologic: without any new focal findings Gait not checked due to patient safety concerns  Vitals:   02/10/20 1204 02/10/20 1205 02/10/20 1206 02/10/20 1340  BP:    118/64  Pulse: 81 83 84 82  Resp: 16 19 19 20   Temp:      TempSrc:      SpO2: 95% 95% 95% 95%  Weight:      Height:        Intake/Output Summary (Last 24 hours) at 02/10/2020 1447 Last data filed at 02/10/2020 1101 Gross per 24 hour  Intake 100 ml  Output 1725 ml  Net -1625 ml   Filed Weights   02/09/20 1815  Weight: 98.9 kg    Data Reviewed: I have personally reviewed and interpreted daily labs, tele strips, imagings as discussed above. I reviewed all nursing notes, pharmacy notes, vitals, pertinent old records I have discussed plan of care as  described above with RN and patient/family.  CBC: Recent Labs  Lab 02/09/20 1817  WBC 8.4  HGB 13.7  HCT 41.0  MCV 86.0  PLT 094   Basic Metabolic Panel: Recent Labs  Lab 02/09/20 1817 02/10/20 0359 02/10/20 0821  NA 141 142  --   K 3.7 3.6  --   CL 106 104  --   CO2 23 28  --   GLUCOSE 125* 129*  --   BUN 14 12  --   CREATININE 0.62 0.63  --   CALCIUM 9.3 9.2  --   MG  --   --  2.2  PHOS  --   --  3.9    Studies: CT ABDOMEN PELVIS W CONTRAST  Result Date: 02/09/2020 CLINICAL DATA:  Abdominal distension. Right flank pain. Nausea and vomiting. EXAM: CT ABDOMEN AND PELVIS WITH CONTRAST TECHNIQUE: Multidetector CT imaging of the abdomen and pelvis was performed using the standard protocol following bolus administration of intravenous contrast. CONTRAST:  138mL OMNIPAQUE IOHEXOL 300 MG/ML  SOLN COMPARISON:  None. FINDINGS: Lower chest: Multi chamber cardiomegaly. Subsegmental atelectasis in both lower lobes, left greater than right. No pleural fluid. Hepatobiliary: No focal hepatic abnormality. Post cholecystectomy with mild intra and extrahepatic biliary ductal prominence. Pancreas: No ductal  dilatation or inflammation. Spleen: Normal in size without focal abnormality. Adrenals/Urinary Tract: Nodular thickening of the left adrenal gland without dominant nodule. Normal right adrenal gland. Obstructing 6 x 7 mm stone in the right proximal ureter with moderate hydronephrosis and perinephric edema. There is delayed excretion from the right kidney on delayed phase imaging. Possible punctate nonobstructing stone in the lower right kidney. The more distal ureter is decompressed. No left hydronephrosis. No evidence of left renal calculi. Fat density lesion in the upper left kidney measuring 19 mm consistent with angiomyolipoma. There is an adjacent 8 mm possible enhancing lesion in the left upper kidney, series 2, image 24, too small to characterize. Additional small low-density lesions in the  left kidney are too small to accurately characterize. Partially distended urinary bladder. No bladder wall thickening. No bladder stone. Stomach/Bowel: Small hiatal hernia. Small duodenal diverticulum. No small bowel obstruction or inflammation. Normal appendix. Slightly high-riding cecum in the right mid abdomen. Moderate stool burden in the colon. Distal diverticulosis without diverticulitis. Vascular/Lymphatic: Mild aortic atherosclerosis and tortuosity. No aortic aneurysm. Patent portal vein. Scattered retroperitoneal and ileocolic lymph nodes are likely reactive. Reproductive: Lobulated uterine contours with multiple fibroids, some of which are calcified. 15 mm cyst in the left ovary. Right ovary not confidently visualized. No suspicious adnexal mass. Other: No ascites or free air. Tiny fat containing umbilical hernia. Musculoskeletal: Degenerative disc disease at L3-L4 with mild anterolisthesis. IMPRESSION: 1. Obstructing 6 x 7 mm stone in the right proximal ureter with moderate hydronephrosis and perinephric edema. 2. Possible additional punctate nonobstructing stone in the lower right kidney. 3. 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 There is an adjacent 8 mm possible enhancing lesion in the left upper kidney, too small to characterize. Consider nonemergent characterization with renal protocol MRI on an elective basis after resolution of acute event. 4. Uterine fibroids. 5. Small hiatal hernia. Distal colonic diverticulosis without diverticulitis. Aortic Atherosclerosis (ICD10-I70.0). Electronically Signed   By: Keith Rake M.D.   On: 02/09/2020 22:04   DG Chest Portable 1 View  Result Date: 02/09/2020 CLINICAL DATA:  Shortness of breath EXAM: PORTABLE CHEST 1 VIEW COMPARISON:  12/14/2017 FINDINGS: Cardiac shadow is enlarged but stable. Diffuse increased vascular congestion is noted with mild interstitial edema consistent with congestive failure. No focal infiltrate or sizable effusion is noted.  No bony abnormality is seen. IMPRESSION: Changes of mild CHF. Electronically Signed   By: Inez Catalina M.D.   On: 02/09/2020 23:06   ECHOCARDIOGRAM COMPLETE  Result Date: 02/10/2020    ECHOCARDIOGRAM REPORT   Patient Name:   TALISHIA BETZLER [facility] Date of Exam: 02/10/2020 Medical Rec #:  [id number]             Height:       62.0 in Accession #:    [id number]            Weight:       218.0 lb Date of Birth:  21-Mar-1951             BSA:          1.983 m Patient Age:    24 years              BP:           Not listed in chart/Not  listed in chart mmHg Patient Gender: F                     HR:           79 bpm. Exam Location:  [facility] Procedure: 2D Echo, Color Doppler and Cardiac Doppler Indications:     CHF- acute diastolic 637.85  History:         Patient has prior history of Echocardiogram examinations, most                  recent 12/15/2017. Mitral Valve Prolapse;                  Signs/Symptoms:Dyspnea.  Sonographer:     Sherrie Sport RDCS (AE) Referring Phys:  [id number] Athena Masse Diagnosing Phys: Bartholome Bill MD  Sonographer Comments: Suboptimal apical window. IMPRESSIONS  1. Left ventricular ejection fraction, by estimation, is 55 to 60%. Left ventricular ejection fraction by PLAX is 53 %. The left ventricle has normal function. Left ventricular endocardial border not optimally defined to evaluate regional wall motion. Left ventricular diastolic parameters were normal.  2. Right ventricular systolic function is normal. The right ventricular size is normal. There is normal pulmonary artery systolic pressure.  3. Left atrial size was mildly dilated.  4. Right atrial size was mildly dilated.  5. The mitral valve was not well visualized. No evidence of mitral valve regurgitation.  6. The aortic valve was not well visualized. Aortic valve regurgitation is not visualized. FINDINGS  Left Ventricle: Left ventricular ejection fraction, by estimation, is 55 to 60%. Left  ventricular ejection fraction by PLAX is 53 %. The left ventricle has normal function. Left ventricular endocardial border not optimally defined to evaluate regional wall motion. The left ventricular internal cavity size was normal in size. There is no left ventricular hypertrophy. Left ventricular diastolic parameters were normal. Right Ventricle: The right ventricular size is normal. No increase in right ventricular wall thickness. Right ventricular systolic function is normal. There is normal pulmonary artery systolic pressure. The tricuspid regurgitant velocity is 1.70 m/s, and  with an assumed right atrial pressure of 10 mmHg, the estimated right ventricular systolic pressure is 41.2 mmHg. Left Atrium: Left atrial size was mildly dilated. Right Atrium: Right atrial size was mildly dilated. Pericardium: There is no evidence of pericardial effusion. Mitral Valve: The mitral valve was not well visualized. No evidence of mitral valve regurgitation. Tricuspid Valve: The tricuspid valve is grossly normal. Tricuspid valve regurgitation is trivial. Aortic Valve: The aortic valve was not well visualized. Aortic valve regurgitation is not visualized. Aortic valve mean gradient measures 7.0 mmHg. Aortic valve peak gradient measures 10.9 mmHg. Pulmonic Valve: The pulmonic valve was not well visualized. Pulmonic valve regurgitation is not visualized. Aorta: The aortic root was not well visualized. IAS/Shunts: The interatrial septum was not assessed.  LEFT VENTRICLE PLAX 2D LV EF:         Left            Diastology                ventricular     LV e' lateral:   7.51 cm/s                ejection        LV E/e' lateral: 9.8                fraction by     LV e' medial:    5.11  cm/s                PLAX is 53      LV E/e' medial:  14.4                %. LVIDd:         4.03 cm LVIDs:         2.95 cm LV PW:         1.16 cm LV IVS:        1.18 cm  RIGHT VENTRICLE RV Basal diam:  3.07 cm RV S prime:     14.30 cm/s TAPSE (M-mode):  3.8 cm LEFT ATRIUM              Index       RIGHT ATRIUM           Index LA diam:        5.20 cm  2.62 cm/m  RA Area:     24.00 cm LA Vol (A2C):   82.2 ml  41.45 ml/m RA Volume:   78.60 ml  39.64 ml/m LA Vol (A4C):   111.0 ml 55.98 ml/m LA Biplane Vol: 97.3 ml  49.07 ml/m  AORTIC VALVE                    PULMONIC VALVE AV Vmax:           165.00 cm/s  PV Vmax:        1.10 m/s AV Vmean:          119.000 cm/s PV Peak grad:   4.8 mmHg AV VTI:            0.290 m      RVOT Peak grad: 8 mmHg AV Peak Grad:      10.9 mmHg AV Mean Grad:      7.0 mmHg LVOT Vmax:         109.00 cm/s LVOT Vmean:        72.800 cm/s LVOT VTI:          0.241 m LVOT/AV VTI ratio: 0.83  AORTA Ao Root diam: 3.60 cm MITRAL VALVE               TRICUSPID VALVE MV Area (PHT): 3.00 cm    TR Peak grad:   11.6 mmHg MV Decel Time: 253 msec    TR Vmax:        170.00 cm/s MV E velocity: 73.70 cm/s MV A velocity: 51.40 cm/s  SHUNTS MV E/A ratio:  1.43        Systemic VTI: 0.24 m Bartholome Bill MD Electronically signed by Bartholome Bill MD Signature Date/Time: 02/10/2020/1:08:22 PM    Final     Scheduled Meds: . vitamin C  1,000 mg Oral QPM  . B-complex with vitamin C  1 tablet Oral Daily  . cycloSPORINE  1 drop Both Eyes BID  . famotidine  20 mg Oral QHS  . furosemide  40 mg Intravenous Q12H  . [START ON 02/16/2020] levothyroxine  150 mcg Oral Q Sun  . levothyroxine  175 mcg Oral Once per day on Mon Tue Wed Thu Fri Sat  . ramipril  2.5 mg Oral Q12H  . sodium chloride flush  3 mL Intravenous Q12H  . tamsulosin  0.4 mg Oral Daily   Continuous Infusions: . sodium chloride    . cefTRIAXone (ROCEPHIN)  IV Stopped (02/10/20 0309)  . heparin 1,000 Units/hr (02/10/20 1052)   PRN Meds:  sodium chloride, acetaminophen, albuterol, ALPRAZolam, azelastine, hydrOXYzine, melatonin, ondansetron (ZOFRAN) IV, oxyCODONE, sodium chloride flush, traZODone  Time spent: 35 minutes  Author: Val Riles. MD [facility] 02/10/2020 2:47 PM  To reach  On-call, see care teams to locate the attending and reach out to them via [email or web address]. If 7PM-7AM, please contact night-coverage If you still have difficulty reaching the attending provider, please page the [facility] (Director on Call) for [facility] on amion for assistance.

## 2020-02-10 NOTE — TOC Initial Note (Signed)
Transition of Care [facility]) - Initial/Assessment Note    Patient Details  Name: Mandy Jones MRN: [id number] Date of Birth: 1951/01/08  [facility]) CM/SW Contact:    Anselm Pancoast, RN Phone Number: 02/10/2020, 2:07 PM  Clinical Narrative:                 Spoke to patient at bedside. Patient states she is active with [facility] for [facility] for recent TKR. Patient is independent with ADL's at baseline. Has walker and raised toilet seat. Patient has a neighbor who has been driving her since surgery and will be her transportation home at discharge. Patient has been paying for private aides via [place] since surgery but has decreased the number of hours greatly as she becomes more independent. Patient states she has spoken to [facility] and updated that she was being admitted.   Expected Discharge Plan: [place]     Patient Goals and CMS Choice Patient states their goals for this hospitalization and ongoing recovery are:: Get back home with home health      Expected Discharge Plan and Services Expected Discharge Plan: [place] arrangements for the past 2 months: Single Family Home                   DME Agency: [facility]                  Prior Living Arrangements/Services Living arrangements for the past 2 months: [place] with:: Self Patient language and need for interpreter reviewed:: Yes Do you feel safe going back to the place where you live?: Yes      Need for Family Participation in Patient Care: Yes (Comment) Care giver support system in place?: Yes (comment) Current home services: Home PT, Home RN, Other (comment) (active with [facility]-has private pay aides through [employer]) Criminal Activity/Legal Involvement Pertinent to Current Situation/Hospitalization: No - Comment as needed  Activities of Daily Living      Permission Sought/Granted Permission  sought to share information with : Facility Art therapist granted to share information with : Yes, Verbal Permission Granted  Share Information with NAME: [facility]  Permission granted to share info w AGENCY: [facility]        Emotional Assessment Appearance:: Appears stated age Attitude/Demeanor/Rapport: Engaged, Self-Confident Affect (typically observed): Accepting Orientation: : Oriented to Place, Oriented to  Time, Oriented to Situation, Oriented to Self Alcohol / Substance Use: Never Used Psych Involvement: No (comment)  Admission diagnosis:  Acute diastolic heart failure ([place]) [I50.31] Patient Active Problem List   Diagnosis Date Noted  . Acute diastolic heart failure ([place]) 02/10/2020  . Acute respiratory failure with hypoxia ([place]) 02/09/2020  . Hydronephrosis with renal and ureteral calculus obstruction 02/09/2020  . Acute pyelonephritis 02/09/2020  . Acute diastolic CHF (congestive heart failure) ([place]) 02/09/2020  . 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 AF (paroxysmal atrial fibrillation) ([place]) s/p ablation 02/09/2020  . Post-op pain 04/08/2018  . Post-operative pain 04/06/2018  . Atrial fibrillation with RVR ([place]) 12/14/2017  . Hypothyroidism 12/14/2017  . Valvular heart disease 12/14/2017  . Muscle cramps 12/14/2017  . S/P complete thyroidectomy 08/03/2017   PCP:  Elaina Pattee, MD Pharmacy:   [id number] - [place], [address] [place] [address] [place] [address] Phone: [phone] Fax: [phone]     Social Determinants of Health (  SDOH) Interventions    Readmission Risk Interventions Readmission Risk Prevention Plan 04/06/2018  Post Dischage Appt Complete  Medication Screening Complete  Transportation Screening Complete  PCP follow-up Complete  Some recent data might be hidden

## 2020-02-11 ENCOUNTER — Other Ambulatory Visit: Payer: Self-pay | Admitting: Radiology

## 2020-02-11 DIAGNOSIS — I5031 Acute diastolic (congestive) heart failure: Secondary | ICD-10-CM | POA: Diagnosis not present

## 2020-02-11 DIAGNOSIS — N201 Calculus of ureter: Secondary | ICD-10-CM

## 2020-02-11 LAB — BASIC METABOLIC PANEL
Anion gap: 11 (ref 5–15)
BUN: 23 mg/dL (ref 8–23)
CO2: 28 mmol/L (ref 22–32)
Calcium: 8.9 mg/dL (ref 8.9–10.3)
Chloride: 101 mmol/L (ref 98–111)
Creatinine, Ser: 0.7 mg/dL (ref 0.44–1.00)
GFR calc Af Amer: >60 mL/min (ref >60)
GFR calc non Af Amer: >60 mL/min (ref >60)
Glucose, Bld: 106 mg/dL — ABNORMAL HIGH (ref 70–99)
Potassium: 3.6 mmol/L (ref 3.5–5.1)
Sodium: 140 mmol/L (ref 135–145)

## 2020-02-11 LAB — CBC
HCT: 37.6 % (ref 36.0–46.0)
Hemoglobin: 12.7 g/dL (ref 12.0–15.0)
MCH: 29.3 pg (ref 26.0–34.0)
MCHC: 33.8 g/dL (ref 30.0–36.0)
MCV: 86.8 fL (ref 80.0–100.0)
Platelets: 183 10*3/uL (ref 150–400)
RBC: 4.33 MIL/uL (ref 3.87–5.11)
RDW: 15.2 % (ref 11.5–15.5)
WBC: 7.2 10*3/uL (ref 4.0–10.5)
nRBC: 0 % (ref 0.0–0.2)

## 2020-02-11 LAB — URINE CULTURE: Culture: NO GROWTH

## 2020-02-11 MED ORDER — SODIUM CHLORIDE 0.9 % IV SOLN
INTRAVENOUS | Status: DC | PRN
  Administered 2020-02-11: 250 mL via INTRAVENOUS

## 2020-02-11 MED ORDER — TAMSULOSIN HCL 0.4 MG PO CAPS: 0.4000 mg | ORAL_CAPSULE | Freq: Every day | ORAL | 0 refills | Status: DC

## 2020-02-11 MED ORDER — FUROSEMIDE 40 MG PO TABS: 40.0000 mg | ORAL_TABLET | Freq: Two times a day (BID) | ORAL | 0 refills | Status: DC

## 2020-02-11 NOTE — Discharge Summary (Signed)
[facility] Discharge Summary   Patient: [facility] EQA:[id number]  PCP: Elaina Pattee, MD  Date of admission: 02/09/2020   Date of discharge:  02/11/2020     Discharge Diagnoses:   Principal Problem:   Acute diastolic CHF (congestive heart failure) ([place]) Active Problems:   Acute respiratory failure with hypoxia ([place])   Hydronephrosis with renal and ureteral calculus obstruction   Acute pyelonephritis   Post-surgical hypothyroidism   AF (paroxysmal atrial fibrillation) ([place]) s/p ablation   Acute diastolic heart failure ([place])   Admitted From: Home Disposition:  Home   Recommendations for Outpatient Follow-up:  1. PCP: PCP in 1 week, monitor blood pressure at home 2. Follow with a urologist for ESWL on Thursday 3. Follow with cardiologist in 1 week for diuresis   Follow-up Information    [place] Follow up on 03/03/2020.   Specialty: Cardiology Why: at [place] through the [place] entrance Contact information: [place] [address] [phone]             Diet recommendation: Cardiac diet  Activity: The patient is advised to gradually reintroduce usual activities, as tolerated  Discharge Condition: stable  Code Status: Full code   History of present illness: As per the H and P dictated on admission.  Hospital Course:  [facility] a 69 y.o.femalewith medical history significant forparoxysmal A. fib status post recent ablation, on apixaban and followed by Dr. Nehemiah Massed, history of surgical hypothyroidism, recently diagnosed with UTI treated with ciprofloxacin who presents to the emergency room with 1 day onset of right colicky flank pain of moderate to severe intensity radiating to right groin, associated with intermittent vomiting. She has chills but no fever. Worse with movements. No alleviating factors. She denies chest pain or  shortness of breath  ED Course:On arrival she was afebrile with vitals within normal limits. Blood work initially was unremarkable. Urinalysis showed no infection but did show red blood cells. She had a CT abdomen that showed 6-7 in the medial right obstructing stone with moderate hydronephrosis. Patient was treated with antiemetics and pain medication and while being prepared for discharge, was noted to be wheezing and short winded as she walked back from the commode.O2 sat with ambulation was 78%. She subsequently had a chest x-ray that showed pulmonary vascular congestion. She was administered a dose of Lasix. Hospitalist consulted for admission. BNP returned at 232. Troponin and EKG pending at time of decision to admit    Assessment and Plan: Principal Problem: Acute diastolic CHF (congestive heart failure)and Acute respiratory failure with hypoxia. Patient presents with shortness of breath that started while being evaluated for right flank pain in the emergency room, with associated hypoxia, O2 sats 78% on room air with ambulation. Work-up consistent with heart failure with BNP 232 and chest x-ray showing pulmonary vascular congestion. Patient follows with cardiologist, Dr. Nehemiah Massed and had recent ablation for her paroxysmal A. fib and has been doing well. Last echocardiogram on file from 2019 showed EF 55%, s/p IV Lasix 40 mg twice daily, cardiology recommended to continue Lasix 40 mg p.o. twice daily and further titration of diuretics as an outpatient.  Continue to monitor body weight weekly and fluid restriction 1.5 L/day. Elevated troponin most likely demand ischemia due to CHF, Ttroponins 208, 183 trended down, Echocardiogram  shows LVEF 55 to 60%, no LV wall motion abnormality.  Patient was cleared by cardiologist to discharge home without any  work-up and cleared for the procedure ESWL which will be done on Thursday by urologist.  Recommended to start Eliquis after the procedure.   Hypoxia resolved, patient is currently saturating well on room air.  Acute pyelonephritis: Patient recently treated with Cipro for UTI. Diagnosed at urgent care, Perinephric stranding seen on CT in conjunction with hydronephrosis. S/p Empiric Rocephin.  UA looks hematuria only, not very impressive for UTI, urine culture negative.  Discontinued antibiotics.  Follow-up with urology as an outpatient Hydronephrosis with renal and ureteral calculus obstruction. CT abdomen and pelvis showing 6 to 7 mm obstructing stone with moderate hydronephrosis Started Flomax, Urology consulted, Rec. EWSL on Thursday which can be done as an outpatient and hold Eliquis.  Cardiologist cleared for the procedure, recommended to start Eliquis after the procedure.  Patient was given Lovenox therapeutic dose during hospital stay but patient was refusing due to needle sticks.  Patient verbalized understanding of being at risk for stroke during this duration being off of NOAC. Hypothyroid, TSH 9.3 elevated. Continue home levothyroxine, follow with PCP to titrate dose accordingly AF (paroxysmal atrial fibrillation) ([place]) s/p ablation,  currently patient is in normal sinus rhythm, heart rate is well controlled. Hold Eliquis for now and resume after the procedure. Body mass index is 38.92 kg/m.  Nutrition Interventions:  Overall patient's condition remained stable, medically optimized and cleared by cardiologist and urologist to follow-up as an outpatient.  Patient agreed with the discharge planning.     Patient was ambulatory without any assistance. On the day of the discharge the patient's vitals were stable, and no other acute medical condition were reported by patient. the patient was felt safe to be discharge at Home.  Consultants: Urologist and cardiologist Procedures: None  Discharge Exam: General: Appear in no distress, no0 Rash; Oral Mucosa Clear, moist. Cardiovascular: S1 and S2 Present, no Murmur, Respiratory:  nonormal respiratory effort, Bilateral Air entry present and no Crackles, no wheezes Abdomen: Bowel Sound present, Soft and no tenderness, no hernia Extremities: no Pedal edema, no calf tenderness Neurology: alert and oriented to time, place, and person affect appropriate.  Filed Weights   02/09/20 1815 02/10/20 1527 02/11/20 0502  Weight: 98.9 kg 96.3 kg 96.5 kg   Vitals:   02/11/20 0753 02/11/20 1152  BP: 132/76 130/67  Pulse: 75 77  Resp: 18   Temp: 98.4 F (36.9 C)   SpO2: 92% 94%    DISCHARGE MEDICATION: Allergies as of 02/11/2020      Reactions   Tape Rash   Pads used for ablation left a rash for a couple of days.      Medication List    STOP taking these medications   ciprofloxacin 250 MG tablet Commonly known as: CIPRO     TAKE these medications   acetaminophen 325 MG tablet Commonly known as: TYLENOL Take 325 mg by mouth every 6 (six) hours as needed.   albuterol 108 (90 Base) MCG/ACT inhaler Commonly known as: VENTOLIN HFA Inhale 1-2 puffs into the lungs every 6 (six) hours as needed for wheezing or shortness of breath.   apixaban 5 MG Tabs tablet Commonly known as: ELIQUIS Take 1 tablet (5 mg total) by mouth 2 (two) times daily. Start taking on: February 14, 2020 What changed: These instructions start on February 14, 2020. If you are unsure what to do until then, ask your doctor or other care provider.   azelastine 0.1 % nasal spray Commonly known as: ASTELIN Place 1 spray into both nostrils daily as  needed for rhinitis or allergies.   B-complex with vitamin C tablet Take 1 tablet by mouth daily.   cholecalciferol 25 MCG (1000 UNIT) tablet Commonly known as: VITAMIN D Take 1,000 Units by mouth at bedtime.   co-enzyme Q-10 30 MG capsule Take 30 mg by mouth at bedtime.   cycloSPORINE 0.05 % ophthalmic emulsion Commonly known as: RESTASIS Place 1 drop into both eyes 2 (two) times daily.   famotidine 20 MG tablet Commonly known as: PEPCID Take 40 mg  by mouth at bedtime.   furosemide 40 MG tablet Commonly known as: LASIX Take 1 tablet (40 mg total) by mouth 2 (two) times daily. What changed: when to take this   hydrOXYzine 25 MG tablet Commonly known as: ATARAX/VISTARIL Take 25-50 mg by mouth daily as needed for anxiety.   levothyroxine 175 MCG tablet Commonly known as: SYNTHROID Take 175 mcg by mouth See admin instructions. Take 1 tablet (175 mcg) by mouth on Mondays, Tuesdays, Wednesdays, Thursdays, Fridays, & Saturdays in the morning on a empty stomach.   levothyroxine 150 MCG tablet Commonly known as: SYNTHROID Take 150 mcg by mouth every Sunday. On an empty stomach   melatonin 5 MG Tabs Take 5 mg by mouth at bedtime as needed (sleep).   mirtazapine 15 MG tablet Commonly known as: REMERON Take 15 mg by mouth at bedtime as needed for sleep or pain.   oxyCODONE 5 MG immediate release tablet Commonly known as: Oxy IR/ROXICODONE Take 1-2 tablets by mouth every 4 (four) hours as needed for pain.   PreserVision AREDS 2+Multi Vit Caps Take 1 capsule by mouth in the morning and at bedtime.   tamsulosin 0.4 MG Caps capsule Commonly known as: FLOMAX Take 1 capsule (0.4 mg total) by mouth daily for 7 days. Start taking on: February 12, 2020   traZODone 50 MG tablet Commonly known as: DESYREL Take 100 mg by mouth at bedtime as needed for sleep.   valACYclovir 1000 MG tablet Commonly known as: VALTREX Take 2,000 mg by mouth 2 (two) times daily.   vitamin C 1000 MG tablet Take 1,000 mg by mouth every evening.      Allergies  Allergen Reactions  . Tape Rash    Pads used for ablation left a rash for a couple of days.   Discharge Instructions    Call MD for:  difficulty breathing, headache or visual disturbances   Complete by: As directed    Call MD for:  severe uncontrolled pain   Complete by: As directed    Call MD for:  temperature >100.4   Complete by: As directed    Diet - low sodium heart healthy   Complete by:  As directed    Discharge instructions   Complete by: As directed    Follow-up with PCP in 1 week, continue monitor blood pressure at home and follow with PCP to titrate medications accordingly. Follow with urologist for ESWL on Thursday, resume Eliquis after the procedure if cleared by urologist. Follow with cardiologist in 1 to 2 weeks.   Increase activity slowly   Complete by: As directed       The results of significant diagnostics from this hospitalization (including imaging, microbiology, ancillary and laboratory) are listed below for reference.    Significant Diagnostic Studies: DG Abd 1 View  Result Date: 02/10/2020 CLINICAL DATA:  Pain and hematuria EXAM: ABDOMEN - 1 VIEW COMPARISON:  None. FINDINGS: Multiple pelvic calcifications likely represent phleboliths. There is a 4 mm calcification to  the right of L4-5. There is moderate stool in the colon. There is no bowel dilatation or air-fluid level to suggest bowel obstruction. No free air. There are surgical clips in the right upper quadrant. Lower lung regions are clear. IMPRESSION: Probable phleboliths in the pelvis. 4 mm calcification to the right of L4-5, a potential ureteral calculus. CT abdomen and pelvis without intravenous contrast could be helpful for further evaluation with respect to potential ureteral calculus. No bowel obstruction or free air. Electronically Signed   By: Lowella Grip III M.D.   On: 02/10/2020 15:00   CT ABDOMEN PELVIS W CONTRAST  Result Date: 02/09/2020 CLINICAL DATA:  Abdominal distension. Right flank pain. Nausea and vomiting. EXAM: CT ABDOMEN AND PELVIS WITH CONTRAST TECHNIQUE: Multidetector CT imaging of the abdomen and pelvis was performed using the standard protocol following bolus administration of intravenous contrast. CONTRAST:  138mL OMNIPAQUE IOHEXOL 300 MG/ML  SOLN COMPARISON:  None. FINDINGS: Lower chest: Multi chamber cardiomegaly. Subsegmental atelectasis in both lower lobes, left greater  than right. No pleural fluid. Hepatobiliary: No focal hepatic abnormality. Post cholecystectomy with mild intra and extrahepatic biliary ductal prominence. Pancreas: No ductal dilatation or inflammation. Spleen: Normal in size without focal abnormality. Adrenals/Urinary Tract: Nodular thickening of the left adrenal gland without dominant nodule. Normal right adrenal gland. Obstructing 6 x 7 mm stone in the right proximal ureter with moderate hydronephrosis and perinephric edema. There is delayed excretion from the right kidney on delayed phase imaging. Possible punctate nonobstructing stone in the lower right kidney. The more distal ureter is decompressed. No left hydronephrosis. No evidence of left renal calculi. Fat density lesion in the upper left kidney measuring 19 mm consistent with angiomyolipoma. There is an adjacent 8 mm possible enhancing lesion in the left upper kidney, series 2, image 24, too small to characterize. Additional small low-density lesions in the left kidney are too small to accurately characterize. Partially distended urinary bladder. No bladder wall thickening. No bladder stone. Stomach/Bowel: Small hiatal hernia. Small duodenal diverticulum. No small bowel obstruction or inflammation. Normal appendix. Slightly high-riding cecum in the right mid abdomen. Moderate stool burden in the colon. Distal diverticulosis without diverticulitis. Vascular/Lymphatic: Mild aortic atherosclerosis and tortuosity. No aortic aneurysm. Patent portal vein. Scattered retroperitoneal and ileocolic lymph nodes are likely reactive. Reproductive: Lobulated uterine contours with multiple fibroids, some of which are calcified. 15 mm cyst in the left ovary. Right ovary not confidently visualized. No suspicious adnexal mass. Other: No ascites or free air. Tiny fat containing umbilical hernia. Musculoskeletal: Degenerative disc disease at L3-L4 with mild anterolisthesis. IMPRESSION: 1. Obstructing 6 x 7 mm stone in the  right proximal ureter with moderate hydronephrosis and perinephric edema. 2. Possible additional punctate nonobstructing stone in the lower right kidney. 3. 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 There is an adjacent 8 mm possible enhancing lesion in the left upper kidney, too small to characterize. Consider nonemergent characterization with renal protocol MRI on an elective basis after resolution of acute event. 4. Uterine fibroids. 5. Small hiatal hernia. Distal colonic diverticulosis without diverticulitis. Aortic Atherosclerosis (ICD10-I70.0). Electronically Signed   By: Keith Rake M.D.   On: 02/09/2020 22:04   DG Chest Portable 1 View  Result Date: 02/09/2020 CLINICAL DATA:  Shortness of breath EXAM: PORTABLE CHEST 1 VIEW COMPARISON:  12/14/2017 FINDINGS: Cardiac shadow is enlarged but stable. Diffuse increased vascular congestion is noted with mild interstitial edema consistent with congestive failure. No focal infiltrate or sizable effusion is noted. No bony abnormality is seen.  IMPRESSION: Changes of mild CHF. Electronically Signed   By: Inez Catalina M.D.   On: 02/09/2020 23:06   ECHOCARDIOGRAM COMPLETE  Result Date: 02/10/2020    ECHOCARDIOGRAM REPORT   Patient Name:   [facility] Date of Exam: 02/10/2020 Medical Rec #:  [id number]             Height:       62.0 in Accession #:    [id number]            Weight:       218.0 lb Date of Birth:  04-05-1951             BSA:          1.983 m Patient Age:    68 years              BP:           Not listed in chart/Not                                                     listed in chart mmHg Patient Gender: F                     HR:           79 bpm. Exam Location:  [facility] Procedure: 2D Echo, Color Doppler and Cardiac Doppler Indications:     CHF- acute diastolic 076.22  History:         Patient has prior history of Echocardiogram examinations, most                  recent 12/15/2017. Mitral Valve Prolapse;                   Signs/Symptoms:Dyspnea.  Sonographer:     Sherrie Sport RDCS (AE) Referring Phys:  [id number] Athena Masse Diagnosing Phys: Bartholome Bill MD  Sonographer Comments: Suboptimal apical window. IMPRESSIONS  1. Left ventricular ejection fraction, by estimation, is 55 to 60%. Left ventricular ejection fraction by PLAX is 53 %. The left ventricle has normal function. Left ventricular endocardial border not optimally defined to evaluate regional wall motion. Left ventricular diastolic parameters were normal.  2. Right ventricular systolic function is normal. The right ventricular size is normal. There is normal pulmonary artery systolic pressure.  3. Left atrial size was mildly dilated.  4. Right atrial size was mildly dilated.  5. The mitral valve was not well visualized. No evidence of mitral valve regurgitation.  6. The aortic valve was not well visualized. Aortic valve regurgitation is not visualized. FINDINGS  Left Ventricle: Left ventricular ejection fraction, by estimation, is 55 to 60%. Left ventricular ejection fraction by PLAX is 53 %. The left ventricle has normal function. Left ventricular endocardial border not optimally defined to evaluate regional wall motion. The left ventricular internal cavity size was normal in size. There is no left ventricular hypertrophy. Left ventricular diastolic parameters were normal. Right Ventricle: The right ventricular size is normal. No increase in right ventricular wall thickness. Right ventricular systolic function is normal. There is normal pulmonary artery systolic pressure. The tricuspid regurgitant velocity is 1.70 m/s, and  with an assumed right atrial pressure of 10 mmHg, the estimated right ventricular systolic pressure is 62.5 mmHg. Left Atrium: Left atrial size was mildly dilated.  Right Atrium: Right atrial size was mildly dilated. Pericardium: There is no evidence of pericardial effusion. Mitral Valve: The mitral valve was not well visualized. No evidence of mitral  valve regurgitation. Tricuspid Valve: The tricuspid valve is grossly normal. Tricuspid valve regurgitation is trivial. Aortic Valve: The aortic valve was not well visualized. Aortic valve regurgitation is not visualized. Aortic valve mean gradient measures 7.0 mmHg. Aortic valve peak gradient measures 10.9 mmHg. Pulmonic Valve: The pulmonic valve was not well visualized. Pulmonic valve regurgitation is not visualized. Aorta: The aortic root was not well visualized. IAS/Shunts: The interatrial septum was not assessed.  LEFT VENTRICLE PLAX 2D LV EF:         Left            Diastology                ventricular     LV e' lateral:   7.51 cm/s                ejection        LV E/e' lateral: 9.8                fraction by     LV e' medial:    5.11 cm/s                PLAX is 53      LV E/e' medial:  14.4                %. LVIDd:         4.03 cm LVIDs:         2.95 cm LV PW:         1.16 cm LV IVS:        1.18 cm  RIGHT VENTRICLE RV Basal diam:  3.07 cm RV S prime:     14.30 cm/s TAPSE (M-mode): 3.8 cm LEFT ATRIUM              Index       RIGHT ATRIUM           Index LA diam:        5.20 cm  2.62 cm/m  RA Area:     24.00 cm LA Vol (A2C):   82.2 ml  41.45 ml/m RA Volume:   78.60 ml  39.64 ml/m LA Vol (A4C):   111.0 ml 55.98 ml/m LA Biplane Vol: 97.3 ml  49.07 ml/m  AORTIC VALVE                    PULMONIC VALVE AV Vmax:           165.00 cm/s  PV Vmax:        1.10 m/s AV Vmean:          119.000 cm/s PV Peak grad:   4.8 mmHg AV VTI:            0.290 m      RVOT Peak grad: 8 mmHg AV Peak Grad:      10.9 mmHg AV Mean Grad:      7.0 mmHg LVOT Vmax:         109.00 cm/s LVOT Vmean:        72.800 cm/s LVOT VTI:          0.241 m LVOT/AV VTI ratio: 0.83  AORTA Ao Root diam: 3.60 cm MITRAL VALVE               TRICUSPID VALVE  MV Area (PHT): 3.00 cm    TR Peak grad:   11.6 mmHg MV Decel Time: 253 msec    TR Vmax:        170.00 cm/s MV E velocity: 73.70 cm/s MV A velocity: 51.40 cm/s  SHUNTS MV E/A ratio:  1.43        Systemic  VTI: 0.24 m Bartholome Bill MD Electronically signed by Bartholome Bill MD Signature Date/Time: 02/10/2020/1:08:22 PM    Final     Microbiology: Recent Results (from the past 240 hour(s))  Urine Culture     Status: None   Collection Time: 02/09/20  8:40 PM   Specimen: Urine, Random  Result Value Ref Range Status   Specimen Description   Final    URINE, RANDOM Performed at [facility], [address] [place], [place] [address]    Special Requests   Final    NONE Performed at [facility], [address], [place], [place] [address]    Culture   Final    NO GROWTH Performed at [facility], [place] [address] [place], [place] [address]    Report Status 02/11/2020 FINAL  Final  SARS Coronavirus 2 by RT PCR (hospital order, performed in [facility] hospital lab) Nasopharyngeal Nasopharyngeal Swab     Status: None   Collection Time: 02/09/20 10:50 PM   Specimen: Nasopharyngeal Swab  Result Value Ref Range Status   SARS Coronavirus 2 NEGATIVE NEGATIVE Final    Comment: (NOTE) SARS-CoV-2 target nucleic acids are NOT DETECTED.  The SARS-CoV-2 RNA is generally detectable in upper and lower respiratory specimens during the acute phase of infection. The lowest concentration of SARS-CoV-2 viral copies this assay can detect is 250 copies / mL. A negative result does not preclude SARS-CoV-2 infection and should not be used as the sole basis for treatment or other patient management decisions.  A negative result may occur with improper specimen collection / handling, submission of specimen other than nasopharyngeal swab, presence of viral mutation(s) within the areas targeted by this assay, and inadequate number of viral copies (<250 copies / mL). A negative result must be combined with clinical observations, patient history, and epidemiological information.  Fact Sheet for Patients:   StrictlyIdeas.no  Fact Sheet for Healthcare  Providers: BankingDealers.co.za  This test is not yet approved or  cleared by the [place] FDA and has been authorized for detection and/or diagnosis of SARS-CoV-2 by FDA under an Emergency Use Authorization (EUA).  This EUA will remain in effect (meaning this test can be used) for the duration of the COVID-19 declaration under Section 564(b)(1) of the Act, 21 U.S.C. section 360bbb-3(b)(1), unless the authorization is terminated or revoked sooner.  Performed at [facility], [address] [place], [place] [address]      Labs: CBC: Recent Labs  Lab 02/09/20 1817 02/11/20 0405  WBC 8.4 7.2  HGB 13.7 12.7  HCT 41.0 37.6  MCV 86.0 86.8  PLT 256 032   Basic Metabolic Panel: Recent Labs  Lab 02/09/20 1817 02/10/20 0359 02/10/20 0821 02/11/20 0405  NA 141 142  --  140  K 3.7 3.6  --  3.6  CL 106 104  --  101  CO2 23 28  --  28  GLUCOSE 125* 129*  --  106*  BUN 14 12  --  23  CREATININE 0.62 0.63  --  0.70  CALCIUM 9.3 9.2  --  8.9  MG  --   --  2.2  --   PHOS  --   --  3.9  --    Liver Function Tests: No results for input(s): AST, ALT, ALKPHOS, BILITOT, PROT, ALBUMIN in the last 168 hours. No results for input(s): LIPASE, AMYLASE in the last 168 hours. No results for input(s): AMMONIA in the last 168 hours. Cardiac Enzymes: No results for input(s): CKTOTAL, CKMB, CKMBINDEX, TROPONINI in the last 168 hours. BNP (last 3 results) Recent Labs    02/09/20 1817  BNP 232.6*   CBG: No results for input(s): GLUCAP in the last 168 hours.  Time spent: 35 minutes  Signed:  Val Riles  [facility]  02/11/2020 12:41 PM

## 2020-02-11 NOTE — Progress Notes (Signed)
[facility] Cardiology    SUBJECTIVE: Ms. Sobecki is a 69 year old female with a past medical history significant for longstanding persistent atrial fibrillation s/p ablation in 2019 and most recent cardioversion on 10/23/19, anticoagulated with Eliquis, hypothyroidism, obstructive sleep apnea, compliant with CPAP, hyperlipidemia, and hypertension who presented to the ED on 02/09/20 for right flank pain with associated chills and vomiting.  Workup in the ED was significant for CT abdomen revealing a 6-7 mm obstructive stone in the right proximal ureter with moderate hydronephrosis, chest xray revealing mild interstitial edema, BNP of 232, TSH of 9.3, and high sensitivity troponin 12, 208 respectively.  She has diuresed 1.3 Liters since admission and is no longer requiring supplemental O2.  Besides generalized fatigue, she is currently asymptomatic and denies chest pain, shortness of breath, lower extremity swelling, orthopnea, PND, or syncopal/presyncopal episodes.   She is followed in outpatient cardiology by Dr. Nehemiah Massed. Echocardiogram here in the hospital revealed normal LV systolic function with an EF estimated at 53%; no significant valvular   Stress test on 10/10/19 revealed moderately reduced LV systolic function, an EF of 41%, with global hypokinesis - in atrial fibrillation the duration of the study.    Vitals:   02/10/20 1527 02/10/20 2231 02/11/20 0502 02/11/20 0753  BP:  (!) 111/58 129/73 132/76  Pulse:  74 73 75  Resp:  18 18 18   Temp:  97.9 F (36.6 C) (!) 97.4 F (36.3 C) 98.4 F (36.9 C)  TempSrc:    Oral  SpO2:  92% 93% 92%  Weight: 96.3 kg  96.5 kg   Height: 5\' 2"  (1.575 m)        Intake/Output Summary (Last 24 hours) at 02/11/2020 0826 Last data filed at 02/11/2020 0502 Gross per 24 hour  Intake 360 ml  Output 2150 ml  Net -1790 ml      PHYSICAL EXAM  General: Well developed, well nourished, in no acute distress HEENT:  Normocephalic and atramatic Neck:  No JVD.    Lungs: Clear bilaterally to auscultation and percussion. Heart: HRRR . Normal S1 and S2 without gallops or murmurs.  Abdomen: Bowel sounds are positive, abdomen soft and non-tender  Msk:  Back normal, normal gait. Normal strength and tone for age. Extremities: No clubbing, cyanosis or edema.   Neuro: Alert and oriented X 3. Psych:  Good affect, responds appropriately   LABS: Basic Metabolic Panel: Recent Labs    02/10/20 0359 02/10/20 0821 02/11/20 0405  NA 142  --  140  K 3.6  --  3.6  CL 104  --  101  CO2 28  --  28  GLUCOSE 129*  --  106*  BUN 12  --  23  CREATININE 0.63  --  0.70  CALCIUM 9.2  --  8.9  MG  --  2.2  --   PHOS  --  3.9  --    Liver Function Tests: No results for input(s): AST, ALT, ALKPHOS, BILITOT, PROT, ALBUMIN in the last 72 hours. No results for input(s): LIPASE, AMYLASE in the last 72 hours. CBC: Recent Labs    02/09/20 1817 02/11/20 0405  WBC 8.4 7.2  HGB 13.7 12.7  HCT 41.0 37.6  MCV 86.0 86.8  PLT 256 183   Cardiac Enzymes: No results for input(s): CKTOTAL, CKMB, CKMBINDEX, TROPONINI in the last 72 hours. BNP: Invalid input(s): POCBNP D-Dimer: No results for input(s): DDIMER in the last 72 hours. Hemoglobin A1C: No results for input(s): HGBA1C in the last 72  hours. Fasting Lipid Panel: No results for input(s): CHOL, HDL, LDLCALC, TRIG, CHOLHDL, LDLDIRECT in the last 72 hours. Thyroid Function Tests: Recent Labs    02/09/20 1817  TSH 9.326*   Anemia Panel: No results for input(s): VITAMINB12, FOLATE, FERRITIN, TIBC, IRON, RETICCTPCT in the last 72 hours.  DG Abd 1 View  Result Date: 02/10/2020 CLINICAL DATA:  Pain and hematuria EXAM: ABDOMEN - 1 VIEW COMPARISON:  None. FINDINGS: Multiple pelvic calcifications likely represent phleboliths. There is a 4 mm calcification to the right of L4-5. There is moderate stool in the colon. There is no bowel dilatation or air-fluid level to suggest bowel obstruction. No free air. There are  surgical clips in the right upper quadrant. Lower lung regions are clear. IMPRESSION: Probable phleboliths in the pelvis. 4 mm calcification to the right of L4-5, a potential ureteral calculus. CT abdomen and pelvis without intravenous contrast could be helpful for further evaluation with respect to potential ureteral calculus. No bowel obstruction or free air. Electronically Signed   By: Lowella Grip III M.D.   On: 02/10/2020 15:00   CT ABDOMEN PELVIS W CONTRAST  Result Date: 02/09/2020 CLINICAL DATA:  Abdominal distension. Right flank pain. Nausea and vomiting. EXAM: CT ABDOMEN AND PELVIS WITH CONTRAST TECHNIQUE: Multidetector CT imaging of the abdomen and pelvis was performed using the standard protocol following bolus administration of intravenous contrast. CONTRAST:  149mL OMNIPAQUE IOHEXOL 300 MG/ML  SOLN COMPARISON:  None. FINDINGS: Lower chest: Multi chamber cardiomegaly. Subsegmental atelectasis in both lower lobes, left greater than right. No pleural fluid. Hepatobiliary: No focal hepatic abnormality. Post cholecystectomy with mild intra and extrahepatic biliary ductal prominence. Pancreas: No ductal dilatation or inflammation. Spleen: Normal in size without focal abnormality. Adrenals/Urinary Tract: Nodular thickening of the left adrenal gland without dominant nodule. Normal right adrenal gland. Obstructing 6 x 7 mm stone in the right proximal ureter with moderate hydronephrosis and perinephric edema. There is delayed excretion from the right kidney on delayed phase imaging. Possible punctate nonobstructing stone in the lower right kidney. The more distal ureter is decompressed. No left hydronephrosis. No evidence of left renal calculi. Fat density lesion in the upper left kidney measuring 19 mm consistent with angiomyolipoma. There is an adjacent 8 mm possible enhancing lesion in the left upper kidney, series 2, image 24, too small to characterize. Additional small low-density lesions in the  left kidney are too small to accurately characterize. Partially distended urinary bladder. No bladder wall thickening. No bladder stone. Stomach/Bowel: Small hiatal hernia. Small duodenal diverticulum. No small bowel obstruction or inflammation. Normal appendix. Slightly high-riding cecum in the right mid abdomen. Moderate stool burden in the colon. Distal diverticulosis without diverticulitis. Vascular/Lymphatic: Mild aortic atherosclerosis and tortuosity. No aortic aneurysm. Patent portal vein. Scattered retroperitoneal and ileocolic lymph nodes are likely reactive. Reproductive: Lobulated uterine contours with multiple fibroids, some of which are calcified. 15 mm cyst in the left ovary. Right ovary not confidently visualized. No suspicious adnexal mass. Other: No ascites or free air. Tiny fat containing umbilical hernia. Musculoskeletal: Degenerative disc disease at L3-L4 with mild anterolisthesis. IMPRESSION: 1. Obstructing 6 x 7 mm stone in the right proximal ureter with moderate hydronephrosis and perinephric edema. 2. Possible additional punctate nonobstructing stone in the lower right kidney. 3. 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 There is an adjacent 8 mm possible enhancing lesion in the left upper kidney, too small to characterize. Consider nonemergent characterization with renal protocol MRI on an elective basis after resolution of acute event.  4. Uterine fibroids. 5. Small hiatal hernia. Distal colonic diverticulosis without diverticulitis. Aortic Atherosclerosis (ICD10-I70.0). Electronically Signed   By: Keith Rake M.D.   On: 02/09/2020 22:04   DG Chest Portable 1 View  Result Date: 02/09/2020 CLINICAL DATA:  Shortness of breath EXAM: PORTABLE CHEST 1 VIEW COMPARISON:  12/14/2017 FINDINGS: Cardiac shadow is enlarged but stable. Diffuse increased vascular congestion is noted with mild interstitial edema consistent with congestive failure. No focal infiltrate or sizable effusion is noted.  No bony abnormality is seen. IMPRESSION: Changes of mild CHF. Electronically Signed   By: Inez Catalina M.D.   On: 02/09/2020 23:06   ECHOCARDIOGRAM COMPLETE  Result Date: 02/10/2020    ECHOCARDIOGRAM REPORT   Patient Name:   NEFERTITI MOHAMAD [facility] Date of Exam: 02/10/2020 Medical Rec #:  [id number]             Height:       62.0 in Accession #:    [id number]            Weight:       218.0 lb Date of Birth:  May 26, 1951             BSA:          1.983 m Patient Age:    42 years              BP:           Not listed in chart/Not                                                     listed in chart mmHg Patient Gender: F                     HR:           79 bpm. Exam Location:  [facility] Procedure: 2D Echo, Color Doppler and Cardiac Doppler Indications:     CHF- acute diastolic 803.21  History:         Patient has prior history of Echocardiogram examinations, most                  recent 12/15/2017. Mitral Valve Prolapse;                  Signs/Symptoms:Dyspnea.  Sonographer:     Sherrie Sport RDCS (AE) Referring Phys:  [id number] Athena Masse Diagnosing Phys: Bartholome Bill MD  Sonographer Comments: Suboptimal apical window. IMPRESSIONS  1. Left ventricular ejection fraction, by estimation, is 55 to 60%. Left ventricular ejection fraction by PLAX is 53 %. The left ventricle has normal function. Left ventricular endocardial border not optimally defined to evaluate regional wall motion. Left ventricular diastolic parameters were normal.  2. Right ventricular systolic function is normal. The right ventricular size is normal. There is normal pulmonary artery systolic pressure.  3. Left atrial size was mildly dilated.  4. Right atrial size was mildly dilated.  5. The mitral valve was not well visualized. No evidence of mitral valve regurgitation.  6. The aortic valve was not well visualized. Aortic valve regurgitation is not visualized. FINDINGS  Left Ventricle: Left ventricular ejection fraction, by estimation, is 55 to 60%. Left  ventricular ejection fraction by PLAX is 53 %. The left ventricle has normal function. Left ventricular endocardial border not optimally defined to  evaluate regional wall motion. The left ventricular internal cavity size was normal in size. There is no left ventricular hypertrophy. Left ventricular diastolic parameters were normal. Right Ventricle: The right ventricular size is normal. No increase in right ventricular wall thickness. Right ventricular systolic function is normal. There is normal pulmonary artery systolic pressure. The tricuspid regurgitant velocity is 1.70 m/s, and  with an assumed right atrial pressure of 10 mmHg, the estimated right ventricular systolic pressure is 58.0 mmHg. Left Atrium: Left atrial size was mildly dilated. Right Atrium: Right atrial size was mildly dilated. Pericardium: There is no evidence of pericardial effusion. Mitral Valve: The mitral valve was not well visualized. No evidence of mitral valve regurgitation. Tricuspid Valve: The tricuspid valve is grossly normal. Tricuspid valve regurgitation is trivial. Aortic Valve: The aortic valve was not well visualized. Aortic valve regurgitation is not visualized. Aortic valve mean gradient measures 7.0 mmHg. Aortic valve peak gradient measures 10.9 mmHg. Pulmonic Valve: The pulmonic valve was not well visualized. Pulmonic valve regurgitation is not visualized. Aorta: The aortic root was not well visualized. IAS/Shunts: The interatrial septum was not assessed.  LEFT VENTRICLE PLAX 2D LV EF:         Left            Diastology                ventricular     LV e' lateral:   7.51 cm/s                ejection        LV E/e' lateral: 9.8                fraction by     LV e' medial:    5.11 cm/s                PLAX is 53      LV E/e' medial:  14.4                %. LVIDd:         4.03 cm LVIDs:         2.95 cm LV PW:         1.16 cm LV IVS:        1.18 cm  RIGHT VENTRICLE RV Basal diam:  3.07 cm RV S prime:     14.30 cm/s TAPSE (M-mode):  3.8 cm LEFT ATRIUM              Index       RIGHT ATRIUM           Index LA diam:        5.20 cm  2.62 cm/m  RA Area:     24.00 cm LA Vol (A2C):   82.2 ml  41.45 ml/m RA Volume:   78.60 ml  39.64 ml/m LA Vol (A4C):   111.0 ml 55.98 ml/m LA Biplane Vol: 97.3 ml  49.07 ml/m  AORTIC VALVE                    PULMONIC VALVE AV Vmax:           165.00 cm/s  PV Vmax:        1.10 m/s AV Vmean:          119.000 cm/s PV Peak grad:   4.8 mmHg AV VTI:            0.290 m      RVOT  Peak grad: 8 mmHg AV Peak Grad:      10.9 mmHg AV Mean Grad:      7.0 mmHg LVOT Vmax:         109.00 cm/s LVOT Vmean:        72.800 cm/s LVOT VTI:          0.241 m LVOT/AV VTI ratio: 0.83  AORTA Ao Root diam: 3.60 cm MITRAL VALVE               TRICUSPID VALVE MV Area (PHT): 3.00 cm    TR Peak grad:   11.6 mmHg MV Decel Time: 253 msec    TR Vmax:        170.00 cm/s MV E velocity: 73.70 cm/s MV A velocity: 51.40 cm/s  SHUNTS MV E/A ratio:  1.43        Systemic VTI: 0.24 m Bartholome Bill MD Electronically signed by Bartholome Bill MD Signature Date/Time: 02/10/2020/1:08:22 PM    Final      Echo: Normal LV systolic function with an EF estimated at 53% with no evidence of significant valvular abnormalities or regional wall abnormalities   TELEMETRY: Normal sinus rhythm, rate of 83bpm; no evidence of acute ischemic changes. No evidence of atrial fibrillation   ASSESSMENT AND PLAN:  Principal Problem:   Acute diastolic CHF (congestive heart failure) (HCC) Active Problems:   Acute respiratory failure with hypoxia (HCC)   Hydronephrosis with renal and ureteral calculus obstruction   Acute pyelonephritis   Post-surgical hypothyroidism   AF (paroxysmal atrial fibrillation) (HCC) s/p ablation   Acute diastolic heart failure ([place])    1.  Acute HFpEF  -Echocardiogram during admission revealed preserved LV systolic function with an EF of 53%   -Has diuresed 1.3 liters since admission  -Would recommend to continue diuresis with Lasix 40mg  BID  upon discharge with dose adjustments in an outpatient setting   -Optimized for discharge from a cardiac standpoint with recommendation to f/u with Dr. Nehemiah Massed or Hassell Done within 7-10 days of discharge   2.  Atrial fibrillation   -S/p ablation and recent cardioversion - currently in normal sinus rhythm, rate well controlled   -History of bradycardia on BB and amiodarone; will continue to remain off of rate lowering medications   -Okay to hold Eliquis until scheduled lithotripsy on 02/13/20; resume 5mg  BID immediately following procedure   3.  Elevated troponin   -12, 208, 183, 112; in the absence of chest pain and no evidence of ischemic ECG changes, likely demand ischemia in the setting of acute CHF and hydronephrosis with stone obstruction   -Echocardiogram negative for regional wall motion abnormalities   4.  Preoperative cardiac risk assessment   -Optimized to proceed with scheduled lithotripsy from a cardiovascular standpoint - no plans for further ischemic workup   -Agree to hold Eliquis prior to procedure; recommend to resume immediately following    The history, physical exam findings, and plan of care were all discussed with Dr. Bartholome Bill, and all decision making was made in collaboration.   Avie Arenas  PA-C 02/11/2020 8:26 AM

## 2020-02-11 NOTE — Progress Notes (Signed)
Pt did not take evening dose of Altace/Ramipril stating that she does not remember taking that medication and the lovenox  Stating that she is "afraid" of needles.

## 2020-02-11 NOTE — Progress Notes (Signed)
Urology Inpatient Progress Note  Subjective: Pain and nausea well controlled today. Urine culture negative. Creatinine WNL.  Anti-infectives: Anti-infectives (From admission, onward)   Start     Dose/Rate Route Frequency Ordered Stop   02/10/20 0100  cefTRIAXone (ROCEPHIN) 1 g in sodium chloride 0.9 % 100 mL IVPB     Discontinue     1 g 200 mL/hr over 30 Minutes Intravenous Every 24 hours 02/10/20 0027        Current Facility-Administered Medications  Medication Dose Route Frequency Provider Last Rate Last Admin  . 0.9 %  sodium chloride infusion  250 mL Intravenous PRN Judd Gaudier V, MD      . 0.9 %  sodium chloride infusion   Intravenous PRN Val Riles, MD 10 mL/hr at 02/11/20 0138 250 mL at 02/11/20 0138  . acetaminophen (TYLENOL) tablet 650 mg  650 mg Oral Q4H PRN Judd Gaudier V, MD      . albuterol (PROVENTIL) (2.5 MG/3ML) 0.083% nebulizer solution 2.5 mg  2.5 mg Nebulization Q6H PRN Val Riles, MD      . ALPRAZolam Duanne Moron) tablet 0.25 mg  0.25 mg Oral BID PRN Athena Masse, MD      . ascorbic acid (VITAMIN C) tablet 1,000 mg  1,000 mg Oral QPM Val Riles, MD   1,000 mg at 02/10/20 1744  . azelastine (ASTELIN) 0.1 % nasal spray 1 spray  1 spray Each Nare Daily PRN Val Riles, MD      . B-complex with vitamin C tablet 1 tablet  1 tablet Oral Daily Val Riles, MD      . cefTRIAXone (ROCEPHIN) 1 g in sodium chloride 0.9 % 100 mL IVPB  1 g Intravenous Q24H Judd Gaudier V, MD 200 mL/hr at 02/11/20 0139 1 g at 02/11/20 0139  . cycloSPORINE (RESTASIS) 0.05 % ophthalmic emulsion 1 drop  1 drop Both Eyes BID Val Riles, MD   1 drop at 02/10/20 2348  . enoxaparin (LOVENOX) injection 100 mg  1 mg/kg Subcutaneous BID Val Riles, MD      . famotidine (PEPCID) tablet 20 mg  20 mg Oral QHS Val Riles, MD   20 mg at 02/10/20 2226  . furosemide (LASIX) injection 40 mg  40 mg Intravenous Q12H Athena Masse, MD   40 mg at 02/11/20 0759  . hydrOXYzine (ATARAX/VISTARIL)  tablet 25-50 mg  25-50 mg Oral Daily PRN Val Riles, MD      . Derrill Memo ON 02/16/2020] levothyroxine (SYNTHROID) tablet 150 mcg  150 mcg Oral Q Phillips Grout, Dileep, MD   150 mcg at 02/10/20 1056  . levothyroxine (SYNTHROID) tablet 175 mcg  175 mcg Oral Once per day on Mon Tue Wed Thu Fri Sat Val Riles, MD   175 mcg at 02/11/20 0759  . melatonin tablet 5 mg  5 mg Oral QHS PRN Val Riles, MD      . ondansetron [facility]) injection 4 mg  4 mg Intravenous Q6H PRN Athena Masse, MD      . oxyCODONE (Oxy IR/ROXICODONE) immediate release tablet 5 mg  5 mg Oral Q4H PRN Val Riles, MD   5 mg at 02/10/20 1055  . ramipril (ALTACE) capsule 2.5 mg  2.5 mg Oral Q12H Athena Masse, MD   2.5 mg at 02/10/20 0924  . sodium chloride flush (NS) 0.9 % injection 3 mL  3 mL Intravenous Q12H Athena Masse, MD   3 mL at 02/11/20 0153  . sodium chloride flush (NS) 0.9 %  injection 3 mL  3 mL Intravenous PRN Athena Masse, MD   3 mL at 02/10/20 1057  . tamsulosin (FLOMAX) capsule 0.4 mg  0.4 mg Oral Daily Judd Gaudier V, MD   0.4 mg at 02/10/20 0924  . traZODone (DESYREL) tablet 100 mg  100 mg Oral QHS PRN Val Riles, MD       Objective: Vital signs in last 24 hours: Temp:  [97.4 F (36.3 C)-98.4 F (36.9 C)] 98.4 F (36.9 C) (06/15 0753) Pulse Rate:  [72-94] 75 (06/15 0753) Resp:  [13-37] 18 (06/15 0753) BP: (98-132)/(58-76) 132/76 (06/15 0753) SpO2:  [92 %-100 %] 92 % (06/15 0753) Weight:  [96.3 kg-96.5 kg] 96.5 kg (06/15 0502)  Intake/Output from previous day: 06/14 0701 - 06/15 0700 In: 360 [P.O.:360] Out: 2150 [Urine:2150] Intake/Output this shift: No intake/output data recorded.  Physical Exam Vitals and nursing note reviewed.  Constitutional:      General: She is not in acute distress.    Appearance: She is not ill-appearing, toxic-appearing or diaphoretic.  HENT:     Head: Normocephalic and atraumatic.  Pulmonary:     Effort: Pulmonary effort is normal. No respiratory distress.   Skin:    General: Skin is warm and dry.  Neurological:     Mental Status: She is alert and oriented to person, place, and time.  Psychiatric:        Mood and Affect: Mood normal.        Behavior: Behavior normal.    Lab Results:  Recent Labs    02/09/20 1817 02/11/20 0405  WBC 8.4 7.2  HGB 13.7 12.7  HCT 41.0 37.6  PLT 256 183   BMET Recent Labs    02/10/20 0359 02/11/20 0405  NA 142 140  K 3.6 3.6  CL 104 101  CO2 28 28  GLUCOSE 129* 106*  BUN 12 23  CREATININE 0.63 0.70  CALCIUM 9.2 8.9   PT/INR Recent Labs    02/10/20 0821  LABPROT 13.4  INR 1.1   Assessment & Plan: 69 year old female with PMH A. fib on Eliquis admitted for right renal colic in the setting of a 7 mm proximal right ureteral stone and subsequently found to have acute CHF exacerbation with demand ischemia.  Eliquis held since 02/10/2020, on Lovenox but patient refusing due to fear of needles.  Pain and nausea well controlled.  Okay to discharge from urologic perspective with plans to proceed with ESWL on 02/13/2020.  Can be completed inpatient vs outpatient per primary team's preference.  Given stone location, she will need to be off anticoagulation in advance of procedure--please keep [place] informed of any alternate agents used given her refusal of Lovenox.   Debroah Loop, PA-C 02/11/2020

## 2020-02-11 NOTE — TOC Progression Note (Signed)
Transition of Care [facility]) - Progression Note    Patient Details  Name: Eleisha Branscomb MRN: [id number] Date of Birth: Jul 09, 1951  Transition of Care [facility]) CM/SW Contact  Eileen Stanford, LCSW Phone Number: 02/11/2020, 1:51 PM  Clinical Narrative:   Pt active with [facility]--PT. Md placed orders for resumption. Pt will d/c home today.    Expected Discharge Plan: [place] Barriers to Discharge: No Barriers Identified  Expected Discharge Plan and Services Expected Discharge Plan: [place] Choice: [place] arrangements for the past 2 months: Single Family Home Expected Discharge Date: 02/11/20                 DME Agency: [facility] [place] Determinants of Health (SDOH) Interventions    Readmission Risk Interventions Readmission Risk Prevention Plan 04/06/2018  Post Dischage Appt Complete  Medication Screening Complete  Transportation Screening Complete  PCP follow-up Complete  Some recent data might be hidden

## 2020-02-11 NOTE — Progress Notes (Signed)
Discharge instructions given all questions answered. No distress noted upon discharge. IV access removed and catheter tip intact. Telemetry removed.

## 2020-02-13 ENCOUNTER — Ambulatory Visit: Payer: Medicare HMO

## 2020-02-13 ENCOUNTER — Encounter: Payer: Self-pay | Admitting: Urology

## 2020-02-13 ENCOUNTER — Ambulatory Visit
Admission: RE | Admit: 2020-02-13 | Discharge: 2020-02-13 | Disposition: A | Discharge status: Home / Self Care | Payer: Medicare HMO | Attending: Urology | Admitting: Urology

## 2020-02-13 ENCOUNTER — Other Ambulatory Visit: Payer: Self-pay

## 2020-02-13 ENCOUNTER — Encounter
Admission: RE | Disposition: A | Discharge status: Home / Self Care | Payer: Self-pay | Source: Home / Self Care | Attending: Urology

## 2020-02-13 DIAGNOSIS — Z79890 Hormone replacement therapy (postmenopausal): Secondary | ICD-10-CM | POA: Diagnosis not present

## 2020-02-13 DIAGNOSIS — E890 Postprocedural hypothyroidism: Secondary | ICD-10-CM | POA: Insufficient documentation

## 2020-02-13 DIAGNOSIS — Z87891 Personal history of nicotine dependence: Secondary | ICD-10-CM | POA: Insufficient documentation

## 2020-02-13 DIAGNOSIS — I5032 Chronic diastolic (congestive) heart failure: Secondary | ICD-10-CM | POA: Diagnosis not present

## 2020-02-13 DIAGNOSIS — G4730 Sleep apnea, unspecified: Secondary | ICD-10-CM | POA: Diagnosis not present

## 2020-02-13 DIAGNOSIS — Z96652 Presence of left artificial knee joint: Secondary | ICD-10-CM | POA: Insufficient documentation

## 2020-02-13 DIAGNOSIS — M1990 Unspecified osteoarthritis, unspecified site: Secondary | ICD-10-CM | POA: Diagnosis not present

## 2020-02-13 DIAGNOSIS — N132 Hydronephrosis with renal and ureteral calculous obstruction: Secondary | ICD-10-CM | POA: Diagnosis not present

## 2020-02-13 DIAGNOSIS — Z7901 Long term (current) use of anticoagulants: Secondary | ICD-10-CM | POA: Diagnosis not present

## 2020-02-13 DIAGNOSIS — F329 Major depressive disorder, single episode, unspecified: Secondary | ICD-10-CM | POA: Insufficient documentation

## 2020-02-13 DIAGNOSIS — I480 Paroxysmal atrial fibrillation: Secondary | ICD-10-CM | POA: Insufficient documentation

## 2020-02-13 DIAGNOSIS — N201 Calculus of ureter: Secondary | ICD-10-CM | POA: Diagnosis present

## 2020-02-13 DIAGNOSIS — Z79899 Other long term (current) drug therapy: Secondary | ICD-10-CM | POA: Diagnosis not present

## 2020-02-13 HISTORY — PX: EXTRACORPOREAL SHOCK WAVE LITHOTRIPSY: SHX1557

## 2020-02-13 SURGERY — LITHOTRIPSY, ESWL
Anesthesia: Moderate Sedation | Laterality: Right

## 2020-02-13 MED ORDER — TAMSULOSIN HCL 0.4 MG PO CAPS: 0.4000 mg | ORAL_CAPSULE | Freq: Every day | ORAL | 0 refills | Status: AC

## 2020-02-13 MED ORDER — SODIUM CHLORIDE 0.9 % IV SOLN: INTRAVENOUS | Status: DC

## 2020-02-13 MED ORDER — CIPROFLOXACIN HCL 500 MG PO TABS: 500.0000 mg | ORAL_TABLET | ORAL | Status: AC

## 2020-02-13 MED ORDER — DIAZEPAM 5 MG PO TABS: 10.0000 mg | ORAL_TABLET | ORAL | Status: AC

## 2020-02-13 MED ORDER — DIPHENHYDRAMINE HCL 25 MG PO CAPS
ORAL_CAPSULE | ORAL | Status: AC
  Administered 2020-02-13: 25 mg via ORAL
  Filled 2020-02-13: qty 1

## 2020-02-13 MED ORDER — ONDANSETRON HCL 4 MG/2ML IJ SOLN: 4.0000 mg | Freq: Once | INTRAMUSCULAR | Status: AC | PRN

## 2020-02-13 MED ORDER — ONDANSETRON HCL 4 MG/2ML IJ SOLN
INTRAMUSCULAR | Status: AC
  Administered 2020-02-13: 4 mg via INTRAVENOUS
  Filled 2020-02-13: qty 2

## 2020-02-13 MED ORDER — DIPHENHYDRAMINE HCL 25 MG PO CAPS: 25.0000 mg | ORAL_CAPSULE | ORAL | Status: AC

## 2020-02-13 MED ORDER — OXYCODONE HCL 5 MG PO TABS: 5.0000 mg | ORAL_TABLET | Freq: Three times a day (TID) | ORAL | 0 refills | Status: AC | PRN

## 2020-02-13 MED ORDER — CIPROFLOXACIN HCL 500 MG PO TABS
ORAL_TABLET | ORAL | Status: AC
  Administered 2020-02-13: 500 mg via ORAL
  Filled 2020-02-13: qty 1

## 2020-02-13 MED ORDER — DIAZEPAM 5 MG PO TABS
ORAL_TABLET | ORAL | Status: AC
  Administered 2020-02-13: 10 mg via ORAL
  Filled 2020-02-13: qty 2

## 2020-02-13 NOTE — Brief Op Note (Signed)
02/13/2020  8:13 AM  PATIENT:  Mandy Jones  69 y.o. female  PRE-OPERATIVE DIAGNOSIS:  62mm right proximal ureteral stone  POST-OPERATIVE DIAGNOSIS:  Same  PROCEDURE:  Procedure(s): EXTRACORPOREAL SHOCK WAVE LITHOTRIPSY (ESWL) (Right)  SURGEON:  Surgeon(s) and Role:    * Billey Co, MD - Primary  ANESTHESIA: Conscious Sedation  EBL:  None  Drains: None  Specimen: None  Findings:  1. Stone appeared to have excellent fragmentation under flouroscopy 2. Multiple PVCs requiring gating and decreasing power, stopped at 3500 shocks  DISPO: Flomax, pain meds PRN, RTC 2 weeks KUB, ok to resume eliquis tomorrow  Nickolas Madrid, MD 02/13/2020

## 2020-02-13 NOTE — Discharge Instructions (Addendum)
AMBULATORY SURGERY  DISCHARGE INSTRUCTIONS   1) The drugs that you were given will stay in your system until tomorrow so for the next 24 hours you should not:  A) Drive an automobile B) Make any legal decisions C) Drink any alcoholic beverage   2) You may resume regular meals tomorrow.  Today it is better to start with liquids and gradually work up to solid foods.  You may eat anything you prefer, but it is better to start with liquids, then soup and crackers, and gradually work up to solid foods.   3) Please notify your doctor immediately if you have any unusual bleeding, trouble breathing, redness and pain at the surgery site, drainage, fever, or pain not relieved by medication.    4) Additional Instructions:  Follow [facility] discharge instructions as reviewed.        Please contact your physician with any problems or Same Day Surgery at [phone], Monday through Friday 6 am to 4 pm, or [place] at [facility] number at [phone].

## 2020-02-14 ENCOUNTER — Encounter: Payer: Self-pay | Admitting: Urology

## 2020-02-18 ENCOUNTER — Telehealth: Payer: Self-pay | Admitting: Family

## 2020-02-18 NOTE — Telephone Encounter (Signed)
Spoke with patient regarding her new patient appointment with the [facility] that was made after her recent hospital discharge. Patient said she is having some leg swelling but that's due to her recent knee replacement otherwise no other complaints at this time. She is checking her weight daily, following a low sodium diet but not currently active much due to surgery. She did state that during her surgery she starting throwing PVCs and they had to limit her medications. She has had some mild chest pain on and off for past couple years but nobody is able to figure out why. She confirmed her new appointment with [place] for 7/6.   Alyse Low, [place]

## 2020-02-26 NOTE — Progress Notes (Signed)
Patient ID: Mandy Jones, female    DOB: 1951/05/02, 69 y.o.   MRN: [id number]  HPI  Mandy Jones is a 69 y/o female with a history of atrial fibrillation, HTN, thyroid disease, GERD, sleep apnea, depression, fibromyalgia, hyperlipidemia, previous tobacco use and chronic heart failure.   Echo report from 02/10/20 reviewed and showed an EF of 55-60% along with mild LAE.   Admitted 02/09/20 due to acute heart failure along with flank pain. Cardiology and urology consults obtained. Initially given IV lasix and then transitioned to oral diuretics. Elevated troponin thought to be due to demand ischemia. Cipro given for UTI. Discharged after 2 days.   She presents today for her initial visit with a chief complaint of minimal shortness of breath upon moderate exertion. She says that she notices when she walks to her mailbox, which is ~ 150 feet, that she gets short of breath but then recovers after resting for a few minutes. She has associated fatigue, cough, pedal edema, chest "twinges", anxiety and difficulty sleeping along with this. She denies any dizziness, abdominal distention, chest pain or weight gain.   Does admit to feeling anxious in regards to her recent HF diagnosis and that she is scared about prognosis. Hadn't been wearing her CPAP consistently because of her recent knee replacement and having to be "hooked up to the leg machine". She is now finished with that and the last few days, she's been wearing her CPAP.  Past Medical History:  Diagnosis Date  . Arrhythmia    atrial fibrillation  . Arthritis   . CHF (congestive heart failure) ([place])   . Depression   . Dyspnea   . Dysrhythmia   . Fibromyalgia 1992  . GERD (gastroesophageal reflux disease)   . Hepatitis    Hepatitis B 1985  . Hyperlipidemia   . Hypertension   . Mitral valve prolapse   . Sleep apnea    CPAP  . Tendon tear, ankle    rt foot  . Thyroid dysfunction    Past Surgical History:  Procedure Laterality  Date  . ACHILLES TENDON SURGERY Right 04/06/2018   Procedure: ACHILLES TENDON REPAIR;  Surgeon: [name], DPM;  Location: [facility];  Service: Podiatry;  Laterality: Right;  . CARDIOVERSION N/A 02/06/2018   Procedure: CARDIOVERSION;  Surgeon: Corey Skains, MD;  Location: [facility];  Service: Cardiovascular;  Laterality: N/A;  . CARDIOVERSION N/A 03/28/2018   Procedure: CARDIOVERSION;  Surgeon: Corey Skains, MD;  Location: [facility];  Service: Cardiovascular;  Laterality: N/A;  . CARDIOVERSION N/A 05/09/2018   Procedure: CARDIOVERSION;  Surgeon: Corey Skains, MD;  Location: [facility];  Service: Cardiovascular;  Laterality: N/A;  . CARDIOVERSION N/A 10/23/2019   Procedure: CARDIOVERSION;  Surgeon: Corey Skains, MD;  Location: [facility];  Service: Cardiovascular;  Laterality: N/A;  . CHOLECYSTECTOMY    . CONTINUOUS NERVE MONITORING N/A 08/03/2017   Procedure: LARYNGEAL NERVE MONITORING;  Surgeon: Carloyn Manner, MD;  Location: [facility];  Service: ENT;  Laterality: N/A;  . EXTRACORPOREAL SHOCK WAVE LITHOTRIPSY Right 02/13/2020   Procedure: EXTRACORPOREAL SHOCK WAVE LITHOTRIPSY (ESWL);  Surgeon: Billey Co, MD;  Location: [facility];  Service: Urology;  Laterality: Right;  . EYE SURGERY Bilateral   . FOOT SURGERY Left 2000  . JOINT REPLACEMENT     left knee  . OSTECTOMY Right 04/06/2018   Procedure: OSTECTOMY-HAGLUNDS/RECTROCALCANEAL;  Surgeon: [name], DPM;  Location: [facility];  Service: Podiatry;  Laterality: Right;  . SHOULDER ARTHROSCOPY Bilateral   .  SHOULDER ARTHROSCOPY Left   . THYROIDECTOMY N/A 08/03/2017   Procedure: THYROIDECTOMY;  Surgeon: Carloyn Manner, MD;  Location: [facility];  Service: ENT;  Laterality: N/A;  . TONSILLECTOMY    . TUBAL LIGATION     No family history on file. Social History   Tobacco Use  . Smoking status: Former Research scientist (life sciences)  . Smokeless tobacco: Never Used  . Tobacco comment: quit 30 years ago  Substance Use Topics  . Alcohol use:  Yes    Comment: occassional   Allergies  Allergen Reactions  . Tape Rash    Pads used for ablation left a rash for a couple of days.   Prior to Admission medications   Medication Sig Start Date End Date Taking? Authorizing Provider  albuterol (VENTOLIN HFA) 108 (90 Base) MCG/ACT inhaler Inhale 1-2 puffs into the lungs every 6 (six) hours as needed for wheezing or shortness of breath.   Yes [provider]  apixaban (ELIQUIS) 5 MG TABS tablet Take 1 tablet (5 mg total) by mouth 2 (two) times daily. 02/14/20  Yes Val Riles, MD  Ascorbic Acid (VITAMIN C) 1000 MG tablet Take 1,000 mg by mouth every evening.   Yes [provider]  azelastine (ASTELIN) 0.1 % nasal spray Place 1 spray into both nostrils daily as needed for rhinitis or allergies.    Yes [provider]  B Complex-C (B-COMPLEX WITH VITAMIN C) tablet Take 1 tablet by mouth daily.   Yes [provider]  cholecalciferol (VITAMIN D) 25 MCG (1000 UNIT) tablet Take 1,000 Units by mouth at bedtime.   Yes [provider]  co-enzyme Q-10 30 MG capsule Take 30 mg by mouth at bedtime.   Yes [provider]  cycloSPORINE (RESTASIS) 0.05 % ophthalmic emulsion Place 1 drop into both eyes 2 (two) times daily.    Yes [provider]  famotidine (PEPCID) 20 MG tablet Take 40 mg by mouth at bedtime. 10/09/19  Yes [provider]  furosemide (LASIX) 40 MG tablet Take 1 tablet (40 mg total) by mouth 2 (two) times daily. 02/11/20 03/12/20 Yes Val Riles, MD  hydrOXYzine (ATARAX/VISTARIL) 25 MG tablet Take 25-50 mg by mouth daily as needed for anxiety. 06/21/19  Yes [provider]  levothyroxine (SYNTHROID) 150 MCG tablet Take 150 mcg by mouth every Sunday. On an empty stomach 10/15/19  Yes [provider]  levothyroxine (SYNTHROID) 175 MCG tablet Take 175 mcg by mouth See admin instructions. Take 1 tablet (175 mcg) by mouth on Mondays, Tuesdays, Wednesdays,  Thursdays, Fridays, & Saturdays in the morning on a empty stomach. 10/09/19  Yes [provider]  Melatonin 5 MG TABS Take 5 mg by mouth at bedtime as needed (sleep).    Yes [provider]  mirtazapine (REMERON) 15 MG tablet Take 15 mg by mouth at bedtime as needed for sleep or pain. 10/03/19  Yes [provider]  Multiple Vitamins-Minerals (PRESERVISION AREDS 2+MULTI VIT) CAPS Take 1 capsule by mouth in the morning and at bedtime.   Yes [provider]  traZODone (DESYREL) 50 MG tablet Take 100 mg by mouth at bedtime as needed for sleep.  12/04/17  Yes [provider]  valACYclovir (VALTREX) 1000 MG tablet Take 2,000 mg by mouth 2 (two) times daily.   Yes [provider]    Review of Systems  Constitutional: Positive for fatigue. Negative for appetite change.  HENT: Negative for congestion and postnasal drip.   Eyes: Negative.   Respiratory: Positive for  cough and shortness of breath (when walking ~ 150 feet). Negative for chest tightness.   Cardiovascular: Positive for palpitations ("twinges") and leg swelling. Negative for chest pain.  Gastrointestinal: Negative for abdominal distention and abdominal pain.  Endocrine: Negative.   Genitourinary: Negative.   Musculoskeletal: Negative for back pain and neck pain.  Skin: Negative.   Allergic/Immunologic: Negative.   Neurological: Negative for dizziness and light-headedness.  Hematological: Negative for adenopathy. Does not bruise/bleed easily.  Psychiatric/Behavioral: Positive for sleep disturbance (wearing CPAP). Negative for dysphoric mood. The patient is not nervous/anxious.     Vitals:   03/03/20 0916  BP: (!) 152/74  Pulse: 60  Resp: 20  SpO2: 98%  Weight: 216 lb 4 oz (98.1 kg)  Height: 5\' 2"  (1.575 m)   Wt Readings from Last 3 Encounters:  03/03/20 216 lb 4 oz (98.1 kg)  02/13/20 212 lb (96.2 kg)  02/11/20 212 lb 12.8 oz (96.5 kg)   Lab Results  Component Value Date    CREATININE 0.70 02/11/2020   CREATININE 0.63 02/10/2020   CREATININE 0.62 02/09/2020     Physical Exam Vitals and nursing note reviewed.  Constitutional:      Appearance: She is well-developed.  HENT:     Head: Normocephalic and atraumatic.  Neck:     Vascular: No JVD.  Cardiovascular:     Rate and Rhythm: Normal rate and regular rhythm.  Pulmonary:     Effort: Pulmonary effort is normal. No respiratory distress.     Breath sounds: No wheezing or rales.  Abdominal:     Palpations: Abdomen is soft.     Tenderness: There is no abdominal tenderness.  Musculoskeletal:     Cervical back: Normal range of motion and neck supple.     Right lower leg: No tenderness. Edema (trace pitting) present.     Left lower leg: No tenderness. No edema.  Skin:    General: Skin is warm and dry.  Neurological:     General: No focal deficit present.     Mental Status: She is alert.  Psychiatric:        Mood and Affect: Mood is anxious. Affect is tearful.        Behavior: Behavior normal.    Assessment & Plan:  1: Chronic heart failure with preserved ejection fraction with structural changes- - NYHA class II - euvolemic today - weighing daily; reminded to call for an overnight weight gain of >2 pounds or a weekly weight gain of >5 pounds - not adding salt except to pasta water; reviewed the importance of closely following a low sodium diet; written dietary information and a low sodium cookbook were given to her - saw cardiology Nehemiah Massed) 11/14/19 & returns tomorrow - will begin entresto 24/26mg  BID; voucher provided - will check BMP at her next visit - encouraged her to elevate her legs when sitting for long periods of time; does wear compression socks but doesn't have them on today - spent a lot of time discussing HF and what this means for her as patient was tearful as she thought it meant her heart was going to stop - BNP 02/09/20 was 232.6  2: HTN- - BP mildly elevated today but she was  anxious and she hasn't taken her diuretic yet this morning; she will take it after returning home - saw PCP [name]) 10/03/19 - BMP 02/11/20 reviewed and showed sodium 140, potassium 3.6, creatinine 0.7 and GFR >60  3: Atrial fibrillation- - previous ablation done - feels "  twinges" at time  4: Sleep apnea- - does have CPAP but had stopped using it because of knee replacement - has just recently resumed wearing it  - explained the correlation between HF and untreated sleep apnea   Patient did not bring her medications nor a list. Each medication was verbally reviewed with the patient and she was encouraged to bring the bottles to every visit to confirm accuracy of list.  Return in 1 month or sooner for any questions/problems before then.

## 2020-03-03 ENCOUNTER — Encounter: Payer: Self-pay | Admitting: Family

## 2020-03-03 ENCOUNTER — Ambulatory Visit: Payer: Medicare HMO | Attending: Family | Admitting: Family

## 2020-03-03 ENCOUNTER — Other Ambulatory Visit: Payer: Self-pay

## 2020-03-03 VITALS — BP 152/74 | HR 60 | Resp 20 | Ht 62.0 in | Wt 216.2 lb

## 2020-03-03 DIAGNOSIS — Z79890 Hormone replacement therapy (postmenopausal): Secondary | ICD-10-CM | POA: Diagnosis not present

## 2020-03-03 DIAGNOSIS — Z79899 Other long term (current) drug therapy: Secondary | ICD-10-CM | POA: Insufficient documentation

## 2020-03-03 DIAGNOSIS — Z96652 Presence of left artificial knee joint: Secondary | ICD-10-CM | POA: Diagnosis not present

## 2020-03-03 DIAGNOSIS — M797 Fibromyalgia: Secondary | ICD-10-CM | POA: Insufficient documentation

## 2020-03-03 DIAGNOSIS — I341 Nonrheumatic mitral (valve) prolapse: Secondary | ICD-10-CM | POA: Insufficient documentation

## 2020-03-03 DIAGNOSIS — M1990 Unspecified osteoarthritis, unspecified site: Secondary | ICD-10-CM | POA: Diagnosis not present

## 2020-03-03 DIAGNOSIS — Z96612 Presence of left artificial shoulder joint: Secondary | ICD-10-CM | POA: Diagnosis not present

## 2020-03-03 DIAGNOSIS — G4733 Obstructive sleep apnea (adult) (pediatric): Secondary | ICD-10-CM

## 2020-03-03 DIAGNOSIS — Z7901 Long term (current) use of anticoagulants: Secondary | ICD-10-CM | POA: Insufficient documentation

## 2020-03-03 DIAGNOSIS — Z96611 Presence of right artificial shoulder joint: Secondary | ICD-10-CM | POA: Diagnosis not present

## 2020-03-03 DIAGNOSIS — K219 Gastro-esophageal reflux disease without esophagitis: Secondary | ICD-10-CM | POA: Insufficient documentation

## 2020-03-03 DIAGNOSIS — I5032 Chronic diastolic (congestive) heart failure: Secondary | ICD-10-CM

## 2020-03-03 DIAGNOSIS — F329 Major depressive disorder, single episode, unspecified: Secondary | ICD-10-CM | POA: Diagnosis not present

## 2020-03-03 DIAGNOSIS — I480 Paroxysmal atrial fibrillation: Secondary | ICD-10-CM

## 2020-03-03 DIAGNOSIS — Z888 Allergy status to other drugs, medicaments and biological substances status: Secondary | ICD-10-CM | POA: Insufficient documentation

## 2020-03-03 DIAGNOSIS — Z87891 Personal history of nicotine dependence: Secondary | ICD-10-CM | POA: Insufficient documentation

## 2020-03-03 DIAGNOSIS — G4730 Sleep apnea, unspecified: Secondary | ICD-10-CM | POA: Diagnosis not present

## 2020-03-03 DIAGNOSIS — I110 Hypertensive heart disease with heart failure: Secondary | ICD-10-CM | POA: Insufficient documentation

## 2020-03-03 DIAGNOSIS — E785 Hyperlipidemia, unspecified: Secondary | ICD-10-CM | POA: Diagnosis not present

## 2020-03-03 DIAGNOSIS — I4891 Unspecified atrial fibrillation: Secondary | ICD-10-CM | POA: Insufficient documentation

## 2020-03-03 DIAGNOSIS — Z9851 Tubal ligation status: Secondary | ICD-10-CM | POA: Insufficient documentation

## 2020-03-03 DIAGNOSIS — E890 Postprocedural hypothyroidism: Secondary | ICD-10-CM | POA: Diagnosis not present

## 2020-03-03 DIAGNOSIS — I10 Essential (primary) hypertension: Secondary | ICD-10-CM

## 2020-03-03 MED ORDER — SACUBITRIL-VALSARTAN 24-26 MG PO TABS: 1.0000 | ORAL_TABLET | Freq: Two times a day (BID) | ORAL | 3 refills | Status: DC

## 2020-03-03 NOTE — Patient Instructions (Signed)
Continue weighing daily and call for an overnight weight gain of > 2 pounds or a weekly weight gain of >5 pounds. 

## 2020-03-12 ENCOUNTER — Ambulatory Visit
Admission: RE | Admit: 2020-03-12 | Discharge: 2020-03-12 | Disposition: A | Discharge status: Home / Self Care | Payer: Medicare HMO | Source: Ambulatory Visit | Attending: Physician Assistant | Admitting: Physician Assistant

## 2020-03-12 ENCOUNTER — Ambulatory Visit
Admission: RE | Admit: 2020-03-12 | Discharge: 2020-03-12 | Disposition: A | Discharge status: Home / Self Care | Payer: Medicare HMO | Attending: Physician Assistant | Admitting: Physician Assistant

## 2020-03-12 ENCOUNTER — Encounter: Payer: Self-pay | Admitting: Physician Assistant

## 2020-03-12 ENCOUNTER — Other Ambulatory Visit: Payer: Self-pay

## 2020-03-12 ENCOUNTER — Ambulatory Visit (INDEPENDENT_AMBULATORY_CARE_PROVIDER_SITE_OTHER): Payer: Medicare HMO | Admitting: Physician Assistant

## 2020-03-12 VITALS — BP 144/78 | HR 76 | Ht 62.0 in | Wt 216.1 lb

## 2020-03-12 DIAGNOSIS — N201 Calculus of ureter: Secondary | ICD-10-CM | POA: Insufficient documentation

## 2020-03-12 MED ORDER — TAMSULOSIN HCL 0.4 MG PO CAPS: 0.4000 mg | ORAL_CAPSULE | Freq: Every day | ORAL | 0 refills | Status: DC

## 2020-03-12 NOTE — Progress Notes (Signed)
03/12/2020 5:12 PM   Mandy Jones [facility] [birth year] [id number]  CC: Chief Complaint  Patient presents with  . Nephrolithiasis    HPI: Mandy Jones is a 69 y.o. female with PMH nephrolithiasis s/p ESWL with Dr. Diamantina Providence on 02/13/2020 for management of a 7 mm proximal right ureteral stone who presents today for stone follow-up.  Operative note significant for "excellent fragmentation" of the stone.  Today, she reports passing several fragments after the procedure.  She does report 1 day of abdominal cramping and nausea over this past weekend.  She has not passed any fragments since.  She brings fragments with her today for analysis.  She is no longer taking Flomax.  KUB today reveals clearance of the right proximal ureteral stone with an apparent fragment over the right UVJ.  PMH: Past Medical History:  Diagnosis Date  . Arrhythmia    atrial fibrillation  . Arthritis   . CHF (congestive heart failure) ([place])   . Depression   . Dyspnea   . Dysrhythmia   . Fibromyalgia 1992  . GERD (gastroesophageal reflux disease)   . Hepatitis    Hepatitis B 1985  . Hyperlipidemia   . Hypertension   . Mitral valve prolapse   . Sleep apnea    CPAP  . Tendon tear, ankle    rt foot  . Thyroid dysfunction     Surgical History: Past Surgical History:  Procedure Laterality Date  . ACHILLES TENDON SURGERY Right 04/06/2018   Procedure: ACHILLES TENDON REPAIR;  Surgeon: [name], DPM;  Location: [facility];  Service: Podiatry;  Laterality: Right;  . CARDIOVERSION N/A 02/06/2018   Procedure: CARDIOVERSION;  Surgeon: Corey Skains, MD;  Location: [facility];  Service: Cardiovascular;  Laterality: N/A;  . CARDIOVERSION N/A 03/28/2018   Procedure: CARDIOVERSION;  Surgeon: Corey Skains, MD;  Location: [facility];  Service: Cardiovascular;  Laterality: N/A;  . CARDIOVERSION N/A 05/09/2018   Procedure: CARDIOVERSION;  Surgeon: Corey Skains, MD;  Location: [facility];  Service:  Cardiovascular;  Laterality: N/A;  . CARDIOVERSION N/A 10/23/2019   Procedure: CARDIOVERSION;  Surgeon: Corey Skains, MD;  Location: [facility];  Service: Cardiovascular;  Laterality: N/A;  . CHOLECYSTECTOMY    . CONTINUOUS NERVE MONITORING N/A 08/03/2017   Procedure: LARYNGEAL NERVE MONITORING;  Surgeon: Carloyn Manner, MD;  Location: [facility];  Service: ENT;  Laterality: N/A;  . EXTRACORPOREAL SHOCK WAVE LITHOTRIPSY Right 02/13/2020   Procedure: EXTRACORPOREAL SHOCK WAVE LITHOTRIPSY (ESWL);  Surgeon: Billey Co, MD;  Location: [facility];  Service: Urology;  Laterality: Right;  . EYE SURGERY Bilateral   . FOOT SURGERY Left 2000  . JOINT REPLACEMENT     left knee  . OSTECTOMY Right 04/06/2018   Procedure: OSTECTOMY-HAGLUNDS/RECTROCALCANEAL;  Surgeon: [name], DPM;  Location: [facility];  Service: Podiatry;  Laterality: Right;  . SHOULDER ARTHROSCOPY Bilateral   . SHOULDER ARTHROSCOPY Left   . THYROIDECTOMY N/A 08/03/2017   Procedure: THYROIDECTOMY;  Surgeon: Carloyn Manner, MD;  Location: [facility];  Service: ENT;  Laterality: N/A;  . TONSILLECTOMY    . TUBAL LIGATION      Home Medications:  Allergies as of 03/12/2020      Reactions   Tape Rash   Pads used for ablation left a rash for a couple of days.      Medication List       Accurate as of March 12, 2020  5:12 PM. If you have any questions, ask your nurse or doctor.  albuterol 108 (90 Base) MCG/ACT inhaler Commonly known as: VENTOLIN HFA Inhale 1-2 puffs into the lungs every 6 (six) hours as needed for wheezing or shortness of breath.   apixaban 5 MG Tabs tablet Commonly known as: ELIQUIS Take 1 tablet (5 mg total) by mouth 2 (two) times daily.   azelastine 0.1 % nasal spray Commonly known as: ASTELIN Place 1 spray into both nostrils daily as needed for rhinitis or allergies.   B-complex with vitamin C tablet Take 1 tablet by mouth daily.   cholecalciferol 25 MCG (1000 UNIT) tablet Commonly  known as: VITAMIN D Take 1,000 Units by mouth at bedtime.   co-enzyme Q-10 30 MG capsule Take 30 mg by mouth at bedtime.   cycloSPORINE 0.05 % ophthalmic emulsion Commonly known as: RESTASIS Place 1 drop into both eyes 2 (two) times daily.   famotidine 20 MG tablet Commonly known as: PEPCID Take 40 mg by mouth at bedtime.   furosemide 40 MG tablet Commonly known as: LASIX Take 1 tablet (40 mg total) by mouth 2 (two) times daily.   hydrOXYzine 25 MG tablet Commonly known as: ATARAX/VISTARIL Take 25-50 mg by mouth daily as needed for anxiety.   levothyroxine 175 MCG tablet Commonly known as: SYNTHROID Take 175 mcg by mouth See admin instructions. Take 1 tablet (175 mcg) by mouth on Mondays, Tuesdays, Wednesdays, Thursdays, Fridays, & Saturdays in the morning on a empty stomach.   levothyroxine 150 MCG tablet Commonly known as: SYNTHROID Take 150 mcg by mouth every Sunday. On an empty stomach   melatonin 5 MG Tabs Take 5 mg by mouth at bedtime as needed (sleep).   mirtazapine 15 MG tablet Commonly known as: REMERON Take 15 mg by mouth at bedtime as needed for sleep or pain.   PreserVision AREDS 2+Multi Vit Caps Take 1 capsule by mouth in the morning and at bedtime.   sacubitril-valsartan 24-26 MG Commonly known as: ENTRESTO Take 1 tablet by mouth 2 (two) times daily.   tamsulosin 0.4 MG Caps capsule Commonly known as: FLOMAX Take 1 capsule (0.4 mg total) by mouth daily. Started by: Debroah Loop, PA-C   traZODone 50 MG tablet Commonly known as: DESYREL Take 100 mg by mouth at bedtime as needed for sleep.   valACYclovir 1000 MG tablet Commonly known as: VALTREX Take 2,000 mg by mouth 2 (two) times daily.   vitamin C 1000 MG tablet Take 1,000 mg by mouth every evening.       Allergies:  Allergies  Allergen Reactions  . Tape Rash    Pads used for ablation left a rash for a couple of days.    Family History: No family history on file.  Social  History:   reports that she has quit smoking. She has never used smokeless tobacco. She reports current alcohol use. She reports previous drug use. Drug: Marijuana.  Physical Exam: BP (!) 144/78 (BP Location: Left Arm, Patient Position: Sitting, Cuff Size: Normal)   Pulse 76   Ht 5\' 2"  (1.575 m)   Wt 216 lb 1.6 oz (98 kg)   BMI 39.53 kg/m   Constitutional:  Alert and oriented, no acute distress, nontoxic appearing HEENT: [place], AT Cardiovascular: No clubbing, cyanosis, or edema Respiratory: Normal respiratory effort, no increased work of breathing Skin: No rashes, bruises or suspicious lesions Neurologic: Grossly intact, no focal deficits, moving all 4 extremities Psychiatric: Normal mood and affect  Pertinent Imaging: KUB, 03/12/2020: CLINICAL DATA:  Patient status post lithotripsy 02/13/2020 for a right ureteral  stone.  EXAM: ABDOMEN - 1 VIEW  COMPARISON:  KUB 02/13/2020 and CT abdomen and pelvis 02/09/2020  FINDINGS: Previously seen calcification adjacent to the right L3 transverse process is no longer visualized. No evidence of renal or ureteral stone is seen. Multiple pelvic phleboliths are unchanged. The patient is status post cholecystectomy. Bowel gas pattern is normal.  IMPRESSION: Previously seen right ureteral stone is no longer identified. Negative exam.   Electronically Signed   By: Inge Rise M.D.   On: 03/13/2020 08:37  I personally reviewed the images referenced above and note clearance of the proximal right ureteral stone with an apparent fragment at the right UVJ.  Assessment & Plan:   1. Right ureteral stone Successful stone fragmentation with ESWL.  She does appear to have a small residual fragment, which I suspect to be the source of her cramping and nausea over the weekend.  We'll restart Flomax for an additional month with plans for recheck with KUB prior in 4 weeks.  She expressed understanding.  We'll send fragments today for  analysis and discuss stone prevention recommendations at her next visit. - tamsulosin (FLOMAX) 0.4 MG CAPS capsule; Take 1 capsule (0.4 mg total) by mouth daily.  Dispense: 30 capsule; Refill: 0 - Calculi, with Photograph (to Clinical Lab)   Return in about 4 weeks (around 04/09/2020) for Stone f/u with KUB prior.  Debroah Loop, PA-C  [facility] [address] [place], [place] [address] [phone]

## 2020-03-22 LAB — CALCULI, WITH PHOTOGRAPH (CLINICAL LAB)
Calcium Oxalate Dihydrate: 20 %
Calcium Oxalate Monohydrate: 80 %
Weight Calculi: 27 mg

## 2020-04-05 ENCOUNTER — Other Ambulatory Visit: Payer: Self-pay | Admitting: Physician Assistant

## 2020-04-05 DIAGNOSIS — N201 Calculus of ureter: Secondary | ICD-10-CM

## 2020-04-06 ENCOUNTER — Ambulatory Visit: Payer: Medicare HMO | Admitting: Family

## 2020-04-09 ENCOUNTER — Other Ambulatory Visit: Payer: Self-pay | Admitting: *Deleted

## 2020-04-09 ENCOUNTER — Ambulatory Visit (INDEPENDENT_AMBULATORY_CARE_PROVIDER_SITE_OTHER): Payer: Medicare HMO | Admitting: Physician Assistant

## 2020-04-09 ENCOUNTER — Ambulatory Visit
Admission: RE | Admit: 2020-04-09 | Discharge: 2020-04-09 | Disposition: A | Discharge status: Home / Self Care | Payer: Medicare HMO | Attending: Physician Assistant | Admitting: Physician Assistant

## 2020-04-09 ENCOUNTER — Ambulatory Visit
Admission: RE | Admit: 2020-04-09 | Discharge: 2020-04-09 | Disposition: A | Discharge status: Home / Self Care | Payer: Medicare HMO | Source: Ambulatory Visit | Attending: Physician Assistant | Admitting: Physician Assistant

## 2020-04-09 ENCOUNTER — Encounter: Payer: Self-pay | Admitting: Physician Assistant

## 2020-04-09 ENCOUNTER — Other Ambulatory Visit: Payer: Self-pay

## 2020-04-09 VITALS — BP 157/82 | HR 74

## 2020-04-09 DIAGNOSIS — N2889 Other specified disorders of kidney and ureter: Secondary | ICD-10-CM

## 2020-04-09 DIAGNOSIS — N201 Calculus of ureter: Secondary | ICD-10-CM | POA: Insufficient documentation

## 2020-04-09 NOTE — Progress Notes (Signed)
04/09/2020 4:31 PM   Mandy Jones [facility] 1951-02-15 [id number]  CC: Chief Complaint  Patient presents with  . Follow-up   HPI: Mandy Jones is a 69 y.o. female with PMH A. fib on Eliquis and nephrolithiasis s/p ESWL with Dr. Diamantina Providence on 02/13/2020 for management of a 7 mm proximal right ureteral stone who presents today for stone follow-up.  I saw her in clinic on 03/12/2020 for the same, at which point she reported passing several stone fragments with some persistent abdominal cramping and nausea.  KUB revealed a possible residual fragment over the right UVJ; I elected to proceed with trial of passage with Flomax and plans for follow-up in 1 month.  Today she reports resolution of her abdominal pain and nausea.  No acute concerns.  KUB today reveals resolution of previously seen stone fragment.  Stone analysis resulted with 80% calcium oxalate monohydrate, 20% calcium oxalate dihydrate.  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  She was recommended to undergo renal protocol MRI for further evaluation on an outpatient basis.  In-office UA and microscopy today pan negative.   PMH: Past Medical History:  Diagnosis Date  . Arrhythmia    atrial fibrillation  . Arthritis   . CHF (congestive heart failure) ([place])   . Depression   . Dyspnea   . Dysrhythmia   . Fibromyalgia 1992  . GERD (gastroesophageal reflux disease)   . Hepatitis    Hepatitis B 1985  . Hyperlipidemia   . Hypertension   . Mitral valve prolapse   . Sleep apnea    CPAP  . Tendon tear, ankle    rt foot  . Thyroid dysfunction     Surgical History: Past Surgical History:  Procedure Laterality Date  . ACHILLES TENDON SURGERY Right 04/06/2018   Procedure: ACHILLES TENDON REPAIR;  Surgeon: [name], DPM;  Location: [facility];  Service: Podiatry;  Laterality:  Right;  . CARDIOVERSION N/A 02/06/2018   Procedure: CARDIOVERSION;  Surgeon: Corey Skains, MD;  Location: [facility];  Service: Cardiovascular;  Laterality: N/A;  . CARDIOVERSION N/A 03/28/2018   Procedure: CARDIOVERSION;  Surgeon: Corey Skains, MD;  Location: [facility];  Service: Cardiovascular;  Laterality: N/A;  . CARDIOVERSION N/A 05/09/2018   Procedure: CARDIOVERSION;  Surgeon: Corey Skains, MD;  Location: [facility];  Service: Cardiovascular;  Laterality: N/A;  . CARDIOVERSION N/A 10/23/2019   Procedure: CARDIOVERSION;  Surgeon: Corey Skains, MD;  Location: [facility];  Service: Cardiovascular;  Laterality: N/A;  . CHOLECYSTECTOMY    . CONTINUOUS NERVE MONITORING N/A 08/03/2017   Procedure: LARYNGEAL NERVE MONITORING;  Surgeon: Carloyn Manner, MD;  Location: [facility];  Service: ENT;  Laterality: N/A;  . EXTRACORPOREAL SHOCK WAVE LITHOTRIPSY Right 02/13/2020   Procedure: EXTRACORPOREAL SHOCK WAVE LITHOTRIPSY (ESWL);  Surgeon: Billey Co, MD;  Location: [facility];  Service: Urology;  Laterality: Right;  . EYE SURGERY Bilateral   . FOOT SURGERY Left 2000  . JOINT REPLACEMENT     left knee  . OSTECTOMY Right 04/06/2018   Procedure: OSTECTOMY-HAGLUNDS/RECTROCALCANEAL;  Surgeon: [name], DPM;  Location: [facility];  Service: Podiatry;  Laterality: Right;  . SHOULDER ARTHROSCOPY Bilateral   . SHOULDER ARTHROSCOPY Left   . THYROIDECTOMY N/A 08/03/2017   Procedure: THYROIDECTOMY;  Surgeon: Carloyn Manner, MD;  Location: [facility];  Service: ENT;  Laterality: N/A;  .  TONSILLECTOMY    . TUBAL LIGATION      Home Medications:  Allergies as of 04/09/2020      Reactions   Tape Rash   Pads used for ablation left a rash for a couple of days.      Medication List       Accurate as of April 09, 2020  4:31 PM. If you have any questions, ask your nurse or doctor.        albuterol 108 (90 Base) MCG/ACT inhaler Commonly known as: VENTOLIN HFA Inhale 1-2 puffs into the  lungs every 6 (six) hours as needed for wheezing or shortness of breath.   apixaban 5 MG Tabs tablet Commonly known as: ELIQUIS Take 1 tablet (5 mg total) by mouth 2 (two) times daily.   azelastine 0.1 % nasal spray Commonly known as: ASTELIN Place 1 spray into both nostrils daily as needed for rhinitis or allergies.   B-complex with vitamin C tablet Take 1 tablet by mouth daily.   cholecalciferol 25 MCG (1000 UNIT) tablet Commonly known as: VITAMIN D Take 1,000 Units by mouth at bedtime.   co-enzyme Q-10 30 MG capsule Take 30 mg by mouth at bedtime.   cycloSPORINE 0.05 % ophthalmic emulsion Commonly known as: RESTASIS Place 1 drop into both eyes 2 (two) times daily.   famotidine 20 MG tablet Commonly known as: PEPCID Take 40 mg by mouth at bedtime.   furosemide 40 MG tablet Commonly known as: LASIX Take 1 tablet (40 mg total) by mouth 2 (two) times daily.   hydrOXYzine 25 MG tablet Commonly known as: ATARAX/VISTARIL Take 25-50 mg by mouth daily as needed for anxiety.   levothyroxine 175 MCG tablet Commonly known as: SYNTHROID Take 175 mcg by mouth See admin instructions. Take 1 tablet (175 mcg) by mouth on Mondays, Tuesdays, Wednesdays, Thursdays, Fridays, & Saturdays in the morning on a empty stomach.   levothyroxine 150 MCG tablet Commonly known as: SYNTHROID Take 150 mcg by mouth every Sunday. On an empty stomach   melatonin 5 MG Tabs Take 5 mg by mouth at bedtime as needed (sleep).   mirtazapine 15 MG tablet Commonly known as: REMERON Take 15 mg by mouth at bedtime as needed for sleep or pain.   PreserVision AREDS 2+Multi Vit Caps Take 1 capsule by mouth in the morning and at bedtime.   sacubitril-valsartan 24-26 MG Commonly known as: ENTRESTO Take 1 tablet by mouth 2 (two) times daily.   tamsulosin 0.4 MG Caps capsule Commonly known as: FLOMAX TAKE 1 CAPSULE BY MOUTH EVERY DAY   traZODone 50 MG tablet Commonly known as: DESYREL Take 100 mg by  mouth at bedtime as needed for sleep.   valACYclovir 1000 MG tablet Commonly known as: VALTREX Take 2,000 mg by mouth 2 (two) times daily.   vitamin C 1000 MG tablet Take 1,000 mg by mouth every evening.       Allergies:  Allergies  Allergen Reactions  . Tape Rash    Pads used for ablation left a rash for a couple of days.    Family History: No family history on file.  Social History:   reports that she has quit smoking. She has never used smokeless tobacco. She reports current alcohol use. She reports previous drug use. Drug: Marijuana.  Physical Exam: BP (!) 157/82   Pulse 74   Constitutional:  Alert and oriented, no acute distress, nontoxic appearing HEENT: [place], AT Cardiovascular: No clubbing, cyanosis, or edema Respiratory: Normal respiratory effort,  no increased work of breathing Skin: No rashes, bruises or suspicious lesions Neurologic: Grossly intact, no focal deficits, moving all 4 extremities Psychiatric: Normal mood and affect  Laboratory Data: Results for orders placed or performed in visit on 04/09/20  Urinalysis, Complete  Result Value Ref Range   Specific Gravity, UA 1.010 1.005 - 1.030   pH, UA 6.0 5.0 - 7.5   Color, UA Yellow Yellow   Appearance Ur Clear Clear   Leukocytes,UA Negative Negative   Protein,UA Negative Negative/Trace   Glucose, UA Negative Negative   Ketones, UA Negative Negative   RBC, UA Negative Negative   Bilirubin, UA Negative Negative   Urobilinogen, Ur 0.2 0.2 - 1.0 mg/dL   Nitrite, UA Negative Negative   Pertinent Imaging: KUB, 04/09/2020: CLINICAL DATA:  Urinary tract calculi status post recent lithotripsy  EXAM: ABDOMEN - 1 VIEW  COMPARISON:  03/12/2020  FINDINGS: Supine frontal view of the abdomen and pelvis excludes the bilateral flanks and hemidiaphragms by collimation. No evidence of renal or ureteral calculi. Vascular and uterine calcifications are again seen within the pelvis, stable. Bowel gas pattern is  unremarkable. No acute bony abnormalities.  IMPRESSION: 1. Stable uterine and vascular calcifications within the pelvis. 2. No evidence of radiopaque urinary tract calculi.   Electronically Signed   By: Randa Ngo M.D.   On: 04/10/2020 09:58  I personally reviewed the images referenced above and note clearance of the residual right UVJ fragment.  Assessment & Plan:   69 year old female s/p ESWL for management of a proximal right ureteral stone presents today for follow-up with reassuring KUB and negative UA.  Also with recent findings of possibly enhancing left renal mass requiring further evaluation. 1. Right ureteral stone Resolved per urine testing and KUB today.  Counseled patient to stop Flomax at this time.  I had a lengthy conversation with the patient today regarding stone prevention recommendations for calcium oxalate stone.  We discussed increasing fluid intake, avoiding dietary oxalate, maintaining moderate dietary calcium, avoiding added sodium, and increasing dietary citrate.  Written instructions provided today.  She expressed understanding. - Urinalysis, Complete  2. Left renal mass Ordering a renal protocol MRI for further evaluation of her possibly enhancing left renal mass today.  I would like her to follow-up with Dr. Diamantina Providence in approximately 3 weeks to review results. - MR ABDOMEN WWO CONTRAST   Return in about 3 weeks (around 04/30/2020) for MRI results with Dr. Diamantina Providence.  Debroah Loop, PA-C  [facility] [address], [place], [place] [address] [phone]

## 2020-04-10 LAB — URINALYSIS, COMPLETE
Bilirubin, UA: NEGATIVE
Glucose, UA: NEGATIVE
Ketones, UA: NEGATIVE
Leukocytes,UA: NEGATIVE
Nitrite, UA: NEGATIVE
Protein,UA: NEGATIVE
RBC, UA: NEGATIVE
Specific Gravity, UA: 1.01 (ref 1.005–1.030)
Urobilinogen, Ur: 0.2 mg/dL (ref 0.2–1.0)
pH, UA: 6 (ref 5.0–7.5)

## 2020-04-22 ENCOUNTER — Other Ambulatory Visit: Payer: Self-pay | Admitting: Family Medicine

## 2020-04-22 DIAGNOSIS — N951 Menopausal and female climacteric states: Secondary | ICD-10-CM

## 2020-04-23 ENCOUNTER — Ambulatory Visit: Payer: Medicare HMO | Attending: Family | Admitting: Family

## 2020-04-23 ENCOUNTER — Other Ambulatory Visit: Payer: Self-pay

## 2020-04-23 ENCOUNTER — Encounter: Payer: Self-pay | Admitting: Family

## 2020-04-23 VITALS — BP 139/76 | HR 51 | Resp 20 | Ht 63.0 in | Wt 220.1 lb

## 2020-04-23 DIAGNOSIS — K219 Gastro-esophageal reflux disease without esophagitis: Secondary | ICD-10-CM | POA: Diagnosis not present

## 2020-04-23 DIAGNOSIS — I10 Essential (primary) hypertension: Secondary | ICD-10-CM

## 2020-04-23 DIAGNOSIS — M1990 Unspecified osteoarthritis, unspecified site: Secondary | ICD-10-CM | POA: Insufficient documentation

## 2020-04-23 DIAGNOSIS — Z9049 Acquired absence of other specified parts of digestive tract: Secondary | ICD-10-CM | POA: Insufficient documentation

## 2020-04-23 DIAGNOSIS — E890 Postprocedural hypothyroidism: Secondary | ICD-10-CM | POA: Diagnosis not present

## 2020-04-23 DIAGNOSIS — Z7901 Long term (current) use of anticoagulants: Secondary | ICD-10-CM | POA: Insufficient documentation

## 2020-04-23 DIAGNOSIS — G4730 Sleep apnea, unspecified: Secondary | ICD-10-CM | POA: Insufficient documentation

## 2020-04-23 DIAGNOSIS — Z87440 Personal history of urinary (tract) infections: Secondary | ICD-10-CM | POA: Diagnosis not present

## 2020-04-23 DIAGNOSIS — E785 Hyperlipidemia, unspecified: Secondary | ICD-10-CM | POA: Diagnosis not present

## 2020-04-23 DIAGNOSIS — Z79899 Other long term (current) drug therapy: Secondary | ICD-10-CM | POA: Insufficient documentation

## 2020-04-23 DIAGNOSIS — I110 Hypertensive heart disease with heart failure: Secondary | ICD-10-CM | POA: Diagnosis not present

## 2020-04-23 DIAGNOSIS — I4891 Unspecified atrial fibrillation: Secondary | ICD-10-CM | POA: Diagnosis not present

## 2020-04-23 DIAGNOSIS — I341 Nonrheumatic mitral (valve) prolapse: Secondary | ICD-10-CM | POA: Diagnosis not present

## 2020-04-23 DIAGNOSIS — Z888 Allergy status to other drugs, medicaments and biological substances status: Secondary | ICD-10-CM | POA: Diagnosis not present

## 2020-04-23 DIAGNOSIS — F329 Major depressive disorder, single episode, unspecified: Secondary | ICD-10-CM | POA: Diagnosis not present

## 2020-04-23 DIAGNOSIS — M797 Fibromyalgia: Secondary | ICD-10-CM | POA: Insufficient documentation

## 2020-04-23 DIAGNOSIS — Z87891 Personal history of nicotine dependence: Secondary | ICD-10-CM | POA: Insufficient documentation

## 2020-04-23 DIAGNOSIS — I5032 Chronic diastolic (congestive) heart failure: Secondary | ICD-10-CM | POA: Diagnosis present

## 2020-04-23 DIAGNOSIS — I480 Paroxysmal atrial fibrillation: Secondary | ICD-10-CM

## 2020-04-23 LAB — BASIC METABOLIC PANEL
Anion gap: 10 (ref 5–15)
BUN: 13 mg/dL (ref 8–23)
CO2: 29 mmol/L (ref 22–32)
Calcium: 9.6 mg/dL (ref 8.9–10.3)
Chloride: 104 mmol/L (ref 98–111)
Creatinine, Ser: 0.48 mg/dL (ref 0.44–1.00)
GFR calc Af Amer: >60 mL/min (ref >60)
GFR calc non Af Amer: >60 mL/min (ref >60)
Glucose, Bld: 94 mg/dL (ref 70–99)
Potassium: 4.4 mmol/L (ref 3.5–5.1)
Sodium: 143 mmol/L (ref 135–145)

## 2020-04-23 NOTE — Progress Notes (Signed)
Patient ID: Mandy Jones, female    DOB: [birth year], 69 y.o.   MRN: [id number]  HPI  [name] is a 69 y/o female with a history of atrial fibrillation, HTN, thyroid disease, GERD, sleep apnea, depression, fibromyalgia, hyperlipidemia, previous tobacco use and chronic heart failure.   Echo report from 02/10/20 reviewed and showed an EF of 55-60% along with mild LAE.   Admitted 02/09/20 due to acute heart failure along with flank pain. Cardiology and urology consults obtained. Initially given IV lasix and then transitioned to oral diuretics. Elevated troponin thought to be due to demand ischemia. Cipro given for UTI. Discharged after 2 days.   She presents today for a follow-up visit with a chief complaint of minimal shortness of breath upon moderate exertion. She describes this as chronic in nature having been present for several years although does feel like her breathing has improved since beginning entresto. She has associated cough, fatigue, pedal edema, palpitations, difficulty sleeping and weight gain along with this. She denies any dizziness, abdominal distention or chest pain.  Says that she hasn't taken her diuretic in the last 2 days because she's been busy and has gained 8 pounds over the last 5 days. Edema has also worsened because she's also been sitting at her computer desk for long periods of time without elevating her legs.   Past Medical History:  Diagnosis Date  . Arrhythmia    atrial fibrillation  . Arthritis   . CHF (congestive heart failure) ([place])   . Depression   . Dyspnea   . Dysrhythmia   . Fibromyalgia 1992  . GERD (gastroesophageal reflux disease)   . Hepatitis    Hepatitis B 1985  . Hyperlipidemia   . Hypertension   . Mitral valve prolapse   . Sleep apnea    CPAP  . Tendon tear, ankle    rt foot  . Thyroid dysfunction    Past Surgical History:  Procedure Laterality Date  . ACHILLES TENDON SURGERY Right 04/06/2018   Procedure: ACHILLES TENDON  REPAIR;  Surgeon: [name], DPM;  Location: [facility];  Service: Podiatry;  Laterality: Right;  . CARDIOVERSION N/A 02/06/2018   Procedure: CARDIOVERSION;  Surgeon: Corey Skains, MD;  Location: [facility];  Service: Cardiovascular;  Laterality: N/A;  . CARDIOVERSION N/A 03/28/2018   Procedure: CARDIOVERSION;  Surgeon: Corey Skains, MD;  Location: [facility];  Service: Cardiovascular;  Laterality: N/A;  . CARDIOVERSION N/A 05/09/2018   Procedure: CARDIOVERSION;  Surgeon: Corey Skains, MD;  Location: [facility];  Service: Cardiovascular;  Laterality: N/A;  . CARDIOVERSION N/A 10/23/2019   Procedure: CARDIOVERSION;  Surgeon: Corey Skains, MD;  Location: [facility];  Service: Cardiovascular;  Laterality: N/A;  . CHOLECYSTECTOMY    . CONTINUOUS NERVE MONITORING N/A 08/03/2017   Procedure: LARYNGEAL NERVE MONITORING;  Surgeon: Carloyn Manner, MD;  Location: [facility];  Service: ENT;  Laterality: N/A;  . EXTRACORPOREAL SHOCK WAVE LITHOTRIPSY Right 02/13/2020   Procedure: EXTRACORPOREAL SHOCK WAVE LITHOTRIPSY (ESWL);  Surgeon: Billey Co, MD;  Location: [facility];  Service: Urology;  Laterality: Right;  . EYE SURGERY Bilateral   . FOOT SURGERY Left 2000  . JOINT REPLACEMENT     left knee  . OSTECTOMY Right 04/06/2018   Procedure: OSTECTOMY-HAGLUNDS/RECTROCALCANEAL;  Surgeon: [name], DPM;  Location: [facility];  Service: Podiatry;  Laterality: Right;  . SHOULDER ARTHROSCOPY Bilateral   . SHOULDER ARTHROSCOPY Left   . THYROIDECTOMY N/A 08/03/2017   Procedure: THYROIDECTOMY;  Surgeon:  Carloyn Manner, MD;  Location: [facility];  Service: ENT;  Laterality: N/A;  . TONSILLECTOMY    . TUBAL LIGATION     No family history on file. Social History   Tobacco Use  . Smoking status: Former Research scientist (life sciences)  . Smokeless tobacco: Never Used  . Tobacco comment: quit 30 years ago  Substance Use Topics  . Alcohol use: Yes    Comment: occassional   Allergies  Allergen Reactions  . Tape Rash     Pads used for ablation left a rash for a couple of days.   Prior to Admission medications   Medication Sig Start Date End Date Taking? Authorizing Provider  albuterol (VENTOLIN HFA) 108 (90 Base) MCG/ACT inhaler Inhale 1-2 puffs into the lungs every 6 (six) hours as needed for wheezing or shortness of breath.   Yes [provider]  apixaban (ELIQUIS) 5 MG TABS tablet Take 1 tablet (5 mg total) by mouth 2 (two) times daily. 02/14/20  Yes Val Riles, MD  Ascorbic Acid (VITAMIN C) 1000 MG tablet Take 1,000 mg by mouth every evening.   Yes [provider]  azelastine (ASTELIN) 0.1 % nasal spray Place 1 spray into both nostrils daily as needed for rhinitis or allergies.    Yes [provider]  B Complex-C (B-COMPLEX WITH VITAMIN C) tablet Take 1 tablet by mouth daily.   Yes [provider]  cholecalciferol (VITAMIN D) 25 MCG (1000 UNIT) tablet Take 1,000 Units by mouth at bedtime.   Yes [provider]  cycloSPORINE (RESTASIS) 0.05 % ophthalmic emulsion Place 1 drop into both eyes 2 (two) times daily.    Yes [provider]  famotidine (PEPCID) 20 MG tablet Take 40 mg by mouth at bedtime. 10/09/19  Yes [provider]  hydrOXYzine (ATARAX/VISTARIL) 25 MG tablet Take 25-50 mg by mouth daily as needed for anxiety. 06/21/19  Yes [provider]  levothyroxine (SYNTHROID) 150 MCG tablet Take 150 mcg by mouth every Sunday. On an empty stomach 10/15/19  Yes [provider]  levothyroxine (SYNTHROID) 175 MCG tablet Take 175 mcg by mouth See admin instructions. Take 1 tablet (175 mcg) by mouth on Mondays, Tuesdays, Wednesdays, Thursdays, Fridays, & Saturdays in the morning on a empty stomach. 10/09/19  Yes [provider]  Melatonin 5 MG TABS Take 5 mg by mouth at bedtime as needed (sleep).    Yes [provider]  mirtazapine (REMERON) 15 MG tablet Take 15 mg by mouth at bedtime as needed for sleep or pain.  10/03/19  Yes [provider]  Multiple Vitamins-Minerals (PRESERVISION AREDS 2+MULTI VIT) CAPS Take 1 capsule by mouth in the morning and at bedtime.   Yes [provider]  sacubitril-valsartan (ENTRESTO) 24-26 MG Take 1 tablet by mouth 2 (two) times daily. 03/03/20  Yes Dewie Ahart, Otila Kluver A, FNP  traZODone (DESYREL) 50 MG tablet Take 100 mg by mouth at bedtime as needed for sleep.  12/04/17  Yes [provider]  valACYclovir (VALTREX) 1000 MG tablet Take 2,000 mg by mouth 2 (two) times daily.   Yes [provider]  co-enzyme Q-10 30 MG capsule Take 30 mg by mouth at bedtime. Patient not taking: Reported on 04/23/2020    [provider]  furosemide (LASIX) 40 MG tablet Take 1 tablet (40 mg total) by mouth 2 (two) times daily. 02/11/20 03/12/20  Val Riles, MD    Review of Systems  Constitutional: Positive for fatigue. Negative for appetite change.  HENT: Negative for congestion  and postnasal drip.   Eyes: Negative.   Respiratory: Positive for cough and shortness of breath (improving). Negative for chest tightness.   Cardiovascular: Positive for palpitations ("twinges") and leg swelling. Negative for chest pain.  Gastrointestinal: Negative for abdominal distention and abdominal pain.  Endocrine: Negative.   Genitourinary: Negative.   Musculoskeletal: Negative for back pain and neck pain.  Skin: Negative.   Allergic/Immunologic: Negative.   Neurological: Negative for dizziness and light-headedness.  Hematological: Negative for adenopathy. Does not bruise/bleed easily.  Psychiatric/Behavioral: Positive for sleep disturbance (wearing CPAP). Negative for dysphoric mood. The patient is not nervous/anxious.    Vitals:   04/23/20 1117  BP: 139/76  Pulse: (!) 51  Resp: 20  SpO2: 96%  Weight: 220 lb 2 oz (99.8 kg)  Height: 5\' 3"  (1.6 m)   Wt Readings from Last 3 Encounters:  04/23/20 220 lb 2 oz (99.8 kg)  03/12/20 216 lb 1.6 oz (98 kg)  03/03/20 216 lb  4 oz (98.1 kg)   Lab Results  Component Value Date   CREATININE 0.70 02/11/2020   CREATININE 0.63 02/10/2020   CREATININE 0.62 02/09/2020     Physical Exam Vitals and nursing note reviewed.  Constitutional:      Appearance: She is well-developed.  HENT:     Head: Normocephalic and atraumatic.  Neck:     Vascular: No JVD.  Cardiovascular:     Rate and Rhythm: Normal rate and regular rhythm.  Pulmonary:     Effort: Pulmonary effort is normal. No respiratory distress.     Breath sounds: No wheezing or rales.  Abdominal:     Palpations: Abdomen is soft.     Tenderness: There is no abdominal tenderness.  Musculoskeletal:     Cervical back: Normal range of motion and neck supple.     Right lower leg: No tenderness. Edema (1+ pitting) present.     Left lower leg: No tenderness. Edema (1+ pitting) present.  Skin:    General: Skin is warm and dry.  Neurological:     General: No focal deficit present.     Mental Status: She is alert.  Psychiatric:        Mood and Affect: Mood is anxious. Affect is not tearful.        Behavior: Behavior normal.    Assessment & Plan:  1: Chronic heart failure with preserved ejection fraction with structural changes- - NYHA class II - euvolemic today - weighing daily; reminded to call for an overnight weight gain of >2 pounds or a weekly weight gain of >5 pounds - weight up 4 pounds from last visit here 6 weeks ago; reports home weight gain of 8 pounds in the last 5 days but hasn't taken her diuretic in the last 2 days but she's planning to today - not adding salt except to pasta water - saw cardiology Nehemiah Massed) 04/09/20 - entresto started at her last visit with improvement of her shortness of breath; consider titrating up in the future - will check BMP today - encouraged her to elevate her legs when sitting for long periods of time; does wear compression socks but doesn't have them on today - BNP 02/09/20 was 232.6  2: HTN- - BP looks good  today - saw PCP [name]) 04/10/20 - BMP 02/11/20 reviewed and showed sodium 140, potassium 3.6, creatinine 0.7 and GFR >60  3: Atrial fibrillation- - previous ablation done - feels "twinges" at time    Patient did not bring her medications nor a  list. Each medication was verbally reviewed with the patient and she was encouraged to bring the bottles to every visit to confirm accuracy of list.  Return in 3 months or sooner for any questions/problems before then.

## 2020-04-23 NOTE — Patient Instructions (Signed)
Continue weighing daily and call for an overnight weight gain of > 2 pounds or a weekly weight gain of >5 pounds. 

## 2020-05-06 ENCOUNTER — Ambulatory Visit
Admission: RE | Admit: 2020-05-06 | Discharge: 2020-05-06 | Disposition: A | Discharge status: Home / Self Care | Payer: Medicare HMO | Source: Ambulatory Visit | Attending: Physician Assistant | Admitting: Physician Assistant

## 2020-05-06 ENCOUNTER — Other Ambulatory Visit: Payer: Self-pay

## 2020-05-06 DIAGNOSIS — N2889 Other specified disorders of kidney and ureter: Secondary | ICD-10-CM | POA: Insufficient documentation

## 2020-05-06 MED ORDER — GADOBUTROL 1 MMOL/ML IV SOLN
10.0000 mL | Freq: Once | INTRAVENOUS | Status: AC | PRN
  Administered 2020-05-06: 10 mL via INTRAVENOUS

## 2020-05-13 ENCOUNTER — Other Ambulatory Visit: Payer: Self-pay

## 2020-05-13 ENCOUNTER — Ambulatory Visit: Payer: Medicare HMO | Admitting: Urology

## 2020-05-13 ENCOUNTER — Encounter: Payer: Self-pay | Admitting: Urology

## 2020-05-13 VITALS — BP 130/81 | HR 85 | Ht 63.0 in | Wt 214.0 lb

## 2020-05-13 DIAGNOSIS — N2889 Other specified disorders of kidney and ureter: Secondary | ICD-10-CM | POA: Diagnosis not present

## 2020-05-13 DIAGNOSIS — N200 Calculus of kidney: Secondary | ICD-10-CM

## 2020-05-13 NOTE — Progress Notes (Signed)
   05/13/2020 4:57 PM   Cloe Sockwell [facility] [birth year] [id number]  Reason for visit: Follow up right shockwave lithotripsy, renal mass  HPI: Ms. Ernandez is a 69 year old female with a number of comorbidities who underwent an uncomplicated right shockwave lithotripsy with me on 02/13/2020 for a 7 mm right proximal ureteral stone.  Follow-up KUB on 8/13 showed no residual fragments.  On her preop CT, she had a small lesion in the left kidney and further MRI was recommended.  MRI was performed on 05/06/2020 and shows a less than 1 cm left renal lesion with differential including hemorrhagic cyst or small papillary RCC, and a benign Bosniak 1 renal cyst of the right kidney.  There is a small 1.5 cm angiomyolipoma in the left upper pole.  There was no hydronephrosis.  We reviewed her imaging findings at length today.  We reviewed that the lesion in the left kidney is too small to definitively characterize, but these are typically benign, or even if malignant acting a very indolent fashion.  I recommended follow-up in 1 year with a repeat renal ultrasound, which she was amenable to.  RTC 1 year with renal ultrasound   Billey Co, MD  [facility] [address] [place], [place] [address] [phone]

## 2020-05-13 NOTE — Patient Instructions (Signed)
Dietary Guidelines to Help Prevent Kidney Stones Kidney stones are deposits of minerals and salts that form inside your kidneys. Your risk of developing kidney stones may be greater depending on your diet, your lifestyle, the medicines you take, and whether you have certain medical conditions. Most people can reduce their chances of developing kidney stones by following the instructions below. Depending on your overall health and the type of kidney stones you tend to develop, your dietitian may give you more specific instructions. What are tips for following this plan? Reading food labels  Choose foods with "no salt added" or "low-salt" labels. Limit your sodium intake to less than 1500 mg per day.  Choose foods with calcium for each meal and snack. Try to eat about 300 mg of calcium at each meal. Foods that contain 200-500 mg of calcium per serving include: ? 8 oz (237 ml) of milk, fortified nondairy milk, and fortified fruit juice. ? 8 oz (237 ml) of kefir, yogurt, and soy yogurt. ? 4 oz (118 ml) of tofu. ? 1 oz of cheese. ? 1 cup (300 g) of dried figs. ? 1 cup (91 g) of cooked broccoli. ? 1-3 oz can of sardines or mackerel.  Most people need 1000 to 1500 mg of calcium each day. Talk to your dietitian about how much calcium is recommended for you. Shopping  Buy plenty of fresh fruits and vegetables. Most people do not need to avoid fruits and vegetables, even if they contain nutrients that may contribute to kidney stones.  When shopping for convenience foods, choose: ? Whole pieces of fruit. ? Premade salads with dressing on the side. ? Low-fat fruit and yogurt smoothies.  Avoid buying frozen meals or prepared deli foods.  Look for foods with live cultures, such as yogurt and kefir. Cooking  Do not add salt to food when cooking. Place a salt shaker on the table and allow each person to add his or her own salt to taste.  Use vegetable protein, such as beans, textured vegetable  protein (TVP), or tofu instead of meat in pasta, casseroles, and soups. Meal planning   Eat less salt, if told by your dietitian. To do this: ? Avoid eating processed or premade food. ? Avoid eating fast food.  Eat less animal protein, including cheese, meat, poultry, or fish, if told by your dietitian. To do this: ? Limit the number of times you have meat, poultry, fish, or cheese each week. Eat a diet free of meat at least 2 days a week. ? Eat only one serving each day of meat, poultry, fish, or seafood. ? When you prepare animal protein, cut pieces into small portion sizes. For most meat and fish, one serving is about the size of one deck of cards.  Eat at least 5 servings of fresh fruits and vegetables each day. To do this: ? Keep fruits and vegetables on hand for snacks. ? Eat 1 piece of fruit or a handful of berries with breakfast. ? Have a salad and fruit at lunch. ? Have two kinds of vegetables at dinner.  Limit foods that are high in a substance called oxalate. These include: ? Spinach. ? Rhubarb. ? Beets. ? Potato chips and french fries. ? Nuts.  If you regularly take a diuretic medicine, make sure to eat at least 1-2 fruits or vegetables high in potassium each day. These include: ? Avocado. ? Banana. ? Orange, prune, carrot, or tomato juice. ? Baked potato. ? Cabbage. ? Beans and split   peas. General instructions   Drink enough fluid to keep your urine clear or pale yellow. This is the most important thing you can do.  Talk to your health care provider and dietitian about taking daily supplements. Depending on your health and the cause of your kidney stones, you may be advised: ? Not to take supplements with vitamin C. ? To take a calcium supplement. ? To take a daily probiotic supplement. ? To take other supplements such as magnesium, fish oil, or vitamin B6.  Take all medicines and supplements as told by your health care provider.  Limit alcohol intake to no  more than 1 drink a day for nonpregnant women and 2 drinks a day for men. One drink equals 12 oz of beer, 5 oz of wine, or 1 oz of hard liquor.  Lose weight if told by your health care provider. Work with your dietitian to find strategies and an eating plan that works best for you. What foods are not recommended? Limit your intake of the following foods, or as told by your dietitian. Talk to your dietitian about specific foods you should avoid based on the type of kidney stones and your overall health. Grains Breads. Bagels. Rolls. Baked goods. Salted crackers. Cereal. Pasta. Vegetables Spinach. Rhubarb. Beets. Canned vegetables. Pickles. Olives. Meats and other protein foods Nuts. Nut butters. Large portions of meat, poultry, or fish. Salted or cured meats. Deli meats. Hot dogs. Sausages. Dairy Cheese. Beverages Regular soft drinks. Regular vegetable juice. Seasonings and other foods Seasoning blends with salt. Salad dressings. Canned soups. Soy sauce. Ketchup. Barbecue sauce. Canned pasta sauce. Casseroles. Pizza. Lasagna. Frozen meals. Potato chips. French fries. Summary  You can reduce your risk of kidney stones by making changes to your diet.  The most important thing you can do is drink enough fluid. You should drink enough fluid to keep your urine clear or pale yellow.  Ask your health care provider or dietitian how much protein from animal sources you should eat each day, and also how much salt and calcium you should have each day. This information is not intended to replace advice given to you by your health care provider. Make sure you discuss any questions you have with your health care provider. Document Revised: 12/05/2018 Document Reviewed: 07/26/2016 Elsevier Patient Education  2020 [employer]  

## 2020-05-26 ENCOUNTER — Emergency Department
Admission: EM | Admit: 2020-05-26 | Discharge: 2020-05-26 | Disposition: A | Discharge status: Home / Self Care | Payer: Medicare HMO | Attending: Emergency Medicine | Admitting: Emergency Medicine

## 2020-05-26 ENCOUNTER — Emergency Department: Payer: Medicare HMO

## 2020-05-26 ENCOUNTER — Other Ambulatory Visit: Payer: Self-pay

## 2020-05-26 ENCOUNTER — Encounter: Payer: Self-pay | Admitting: Emergency Medicine

## 2020-05-26 DIAGNOSIS — Y998 Other external cause status: Secondary | ICD-10-CM | POA: Insufficient documentation

## 2020-05-26 DIAGNOSIS — I110 Hypertensive heart disease with heart failure: Secondary | ICD-10-CM | POA: Diagnosis not present

## 2020-05-26 DIAGNOSIS — I5031 Acute diastolic (congestive) heart failure: Secondary | ICD-10-CM | POA: Diagnosis not present

## 2020-05-26 DIAGNOSIS — Y9289 Other specified places as the place of occurrence of the external cause: Secondary | ICD-10-CM | POA: Insufficient documentation

## 2020-05-26 DIAGNOSIS — Z79899 Other long term (current) drug therapy: Secondary | ICD-10-CM | POA: Insufficient documentation

## 2020-05-26 DIAGNOSIS — S8002XA Contusion of left knee, initial encounter: Secondary | ICD-10-CM

## 2020-05-26 DIAGNOSIS — Z87891 Personal history of nicotine dependence: Secondary | ICD-10-CM | POA: Insufficient documentation

## 2020-05-26 DIAGNOSIS — E890 Postprocedural hypothyroidism: Secondary | ICD-10-CM | POA: Diagnosis not present

## 2020-05-26 DIAGNOSIS — Z7901 Long term (current) use of anticoagulants: Secondary | ICD-10-CM | POA: Diagnosis not present

## 2020-05-26 DIAGNOSIS — W010XXA Fall on same level from slipping, tripping and stumbling without subsequent striking against object, initial encounter: Secondary | ICD-10-CM | POA: Insufficient documentation

## 2020-05-26 DIAGNOSIS — S8992XA Unspecified injury of left lower leg, initial encounter: Secondary | ICD-10-CM | POA: Diagnosis present

## 2020-05-26 DIAGNOSIS — Y9389 Activity, other specified: Secondary | ICD-10-CM | POA: Insufficient documentation

## 2020-05-26 DIAGNOSIS — Z96652 Presence of left artificial knee joint: Secondary | ICD-10-CM | POA: Insufficient documentation

## 2020-05-26 DIAGNOSIS — Z79890 Hormone replacement therapy (postmenopausal): Secondary | ICD-10-CM | POA: Diagnosis not present

## 2020-05-26 DIAGNOSIS — T148XXA Other injury of unspecified body region, initial encounter: Secondary | ICD-10-CM

## 2020-05-26 NOTE — ED Triage Notes (Signed)
Pt tripped and fell last week.  Is on eliquis.  Bruising from left foot to above knee.  Swelling noted to this leg. Tight feeling present.  No redness or sx infection.

## 2020-05-26 NOTE — ED Notes (Signed)
Discussed with dr Corky Downs, xray ordered.

## 2020-05-26 NOTE — ED Provider Notes (Signed)
[facility] Emergency Department Provider Note   ____________________________________________    I have reviewed the triage vital signs and the nursing notes.   HISTORY  Chief Complaint Leg Injury     HPI Mandy Jones is a 69 y.o. female who is on Eliquis for atrial fibrillation who presents with complaints of swelling and bruising to her left leg.  Patient reports she fell on her left knee (knee replacement) last week, she has been able to ambulate.  She had a large hematoma, she is concerned because now she has bruising down her left lower leg all the way to her toes.  She also has an swelling, she did ice her leg last night and the bruising worsened after that.  Past Medical History:  Diagnosis Date  . Arrhythmia    atrial fibrillation  . Arthritis   . CHF (congestive heart failure) ([place])   . Depression   . Dyspnea   . Dysrhythmia   . Fibromyalgia 1992  . GERD (gastroesophageal reflux disease)   . Hepatitis    Hepatitis B 1985  . Hyperlipidemia   . Hypertension   . Mitral valve prolapse   . Sleep apnea    CPAP  . Tendon tear, ankle    rt foot  . Thyroid dysfunction     Patient Active Problem List   Diagnosis Date Noted  . Acute diastolic heart failure ([place]) 02/10/2020  . Acute respiratory failure with hypoxia ([place]) 02/09/2020  . Hydronephrosis with renal and ureteral calculus obstruction 02/09/2020  . Acute pyelonephritis 02/09/2020  . Acute diastolic CHF (congestive heart failure) ([place]) 02/09/2020  . 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 AF (paroxysmal atrial fibrillation) ([place]) s/p ablation 02/09/2020  . Post-op pain 04/08/2018  . Post-operative pain 04/06/2018  . Atrial fibrillation with RVR ([place]) 12/14/2017  . Hypothyroidism 12/14/2017  . Valvular heart disease 12/14/2017  . Muscle cramps 12/14/2017  . S/P complete thyroidectomy 08/03/2017    Past Surgical History:  Procedure Laterality Date  . ACHILLES  TENDON SURGERY Right 04/06/2018   Procedure: ACHILLES TENDON REPAIR;  Surgeon: [name], DPM;  Location: [facility] ORS;  Service: Podiatry;  Laterality: Right;  . CARDIOVERSION N/A 02/06/2018   Procedure: CARDIOVERSION;  Surgeon: Corey Skains, MD;  Location: [facility] ORS;  Service: Cardiovascular;  Laterality: N/A;  . CARDIOVERSION N/A 03/28/2018   Procedure: CARDIOVERSION;  Surgeon: Corey Skains, MD;  Location: [facility] ORS;  Service: Cardiovascular;  Laterality: N/A;  . CARDIOVERSION N/A 05/09/2018   Procedure: CARDIOVERSION;  Surgeon: Corey Skains, MD;  Location: [facility] OR[facility];  Service: Cardiovascular;  Laterality: N/A;  . CARDIOVERSION N/A 10/23/2019   Procedure: CARDIOVERSION;  Surgeon: Corey Skains, MD;  Location: [facility] ORS;  Service: Cardiovascular;  Laterality: N/A;  . CHOLECYSTECTOMY    . CONTINUOUS NERVE MONITORING N/A 08/03/2017   Procedure: LARYNGEAL NERVE MONITORING;  Surgeon: Carloyn Manner, MD;  Location: [facility] ORS;  Service: ENT;  Laterality: N/A;  . EXTRACORPOREAL SHOCK WAVE LITHOTRIPSY Right 02/13/2020   Procedure: EXTRACORPOREAL SHOCK WAVE LITHOTRIPSY (ESWL);  Surgeon: Billey Co, MD;  Location: [facility] ORS;  Service: Urology;  Laterality: Right;  . EYE SURGERY Bilateral   . FOOT SURGERY Left 2000  . JOINT REPLACEMENT     left knee  . OSTECTOMY Right 04/06/2018   Procedure: OSTECTOMY-HAGLUNDS/RECTROCALCANEAL;  Surgeon: [name], DPM;  Location: [facility] OR[facility];  Service: Podiatry;  Laterality: Right;  . SHOULDER ARTHROSCOPY Bilateral   . SHOULDER ARTHROSCOPY Left   . THYROIDECTOMY N/A  08/03/2017   Procedure: THYROIDECTOMY;  Surgeon: Carloyn Manner, MD;  Location: [facility] ORS;  Service: ENT;  Laterality: N/A;  . TONSILLECTOMY    . TUBAL LIGATION      Prior to Admission medications   Medication Sig Start Date End Date Taking? Authorizing Provider  albuterol (VENTOLIN HFA) 108 (90 Base) MCG/ACT inhaler Inhale 1-2 puffs into the lungs every 6 (six) hours as needed for  wheezing or shortness of breath.    [provider]  apixaban (ELIQUIS) 5 MG TABS tablet Take 1 tablet (5 mg total) by mouth 2 (two) times daily. 02/14/20   Val Riles, MD  Ascorbic Acid (VITAMIN C) 1000 MG tablet Take 1,000 mg by mouth every evening.    [provider]  azelastine (ASTELIN) 0.1 % nasal spray Place 1 spray into both nostrils daily as needed for rhinitis or allergies.     [provider]  B Complex-C (B-COMPLEX WITH VITAMIN C) tablet Take 1 tablet by mouth daily.    [provider]  cholecalciferol (VITAMIN D) 25 MCG (1000 UNIT) tablet Take 1,000 Units by mouth at bedtime.    [provider]  cycloSPORINE (RESTASIS) 0.05 % ophthalmic emulsion Place 1 drop into both eyes 2 (two) times daily.     [provider]  famotidine (PEPCID) 20 MG tablet Take 40 mg by mouth at bedtime. 10/09/19   [provider]  furosemide (LASIX) 40 MG tablet Take 1 tablet (40 mg total) by mouth 2 (two) times daily. 02/11/20   Val Riles, MD  hydrOXYzine (ATARAX/VISTARIL) 25 MG tablet Take 25-50 mg by mouth daily as needed for anxiety. 06/21/19   [provider]  levothyroxine (SYNTHROID) 150 MCG tablet Take 150 mcg by mouth every Sunday. On an empty stomach 10/15/19   [provider]  levothyroxine (SYNTHROID) 175 MCG tablet Take 175 mcg by mouth See admin instructions. Take 1 tablet (175 mcg) by mouth on Mondays, Tuesdays, Wednesdays, Thursdays, Fridays, & Saturdays in the morning on a empty stomach. 10/09/19   [provider]  Melatonin 5 MG TABS Take 5 mg by mouth at bedtime as needed (sleep).     [provider]  mirtazapine (REMERON) 15 MG tablet Take 15 mg by mouth at bedtime as needed for sleep or pain. 10/03/19   [provider]  Multiple Vitamins-Minerals (PRESERVISION AREDS 2+MULTI VIT) CAPS Take 1 capsule by mouth in the morning and at bedtime.    [provider]  sacubitril-valsartan  (ENTRESTO) 24-26 MG Take 1 tablet by mouth 2 (two) times daily. 03/03/20   Alisa Graff, FNP  traZODone (DESYREL) 50 MG tablet Take 100 mg by mouth at bedtime as needed for sleep.  12/04/17   [provider]  valACYclovir (VALTREX) 1000 MG tablet Take 2,000 mg by mouth 2 (two) times daily.    [provider]     Allergies Tape  History reviewed. No pertinent family history.  Social History Social History   Tobacco Use  . Smoking status: Former Research scientist (life sciences)  . Smokeless tobacco: Never Used  . Tobacco comment: quit 30 years ago  Vaping Use  . Vaping Use: Never used  Substance Use Topics  . Alcohol use: Yes    Comment: occassional  . Drug use: Not Currently    Types: Marijuana    Review of Systems  Constitutional: No fever/chills Eyes: No visual changes.  ENT: No sore throat. Cardiovascular: Denies chest pain. Respiratory: Denies shortness of breath. Gastrointestinal: No abdominal pain.  No nausea, no vomiting.   Genitourinary: Negative for dysuria. Musculoskeletal: As above Skin: Bruising as above Neurological: Negative for headaches   ____________________________________________   PHYSICAL EXAM:  VITAL SIGNS: ED Triage Vitals  Enc Vitals Group     BP 05/26/20 1641 117/71     Pulse Rate 05/26/20 1641 89     Resp 05/26/20 1641 18     Temp 05/26/20 1641 98.1 F (36.7 C)     Temp Source 05/26/20 1641 Oral     SpO2 05/26/20 1641 98 %     Weight 05/26/20 1636 99.8 kg (220 lb)     Height 05/26/20 1636 1.575 m (5\' 2" )     Head Circumference --      Peak Flow --      Pain Score 05/26/20 1636 6     Pain Loc --      Pain Edu? --      Excl. in [place]? --     Constitutional: Alert and oriented. No acute distress. Pleasant and interactive  Head: No hematoma  Neck:  Painless ROM Cardiovascular: Good peripheral circulation. Respiratory: Normal respiratory effort.  No retractions  Musculoskeletal: Left lower leg: Bruising extending from the left knee to  the toes, warm and well perfused, mild swelling, soft compartments Neurologic:  Normal speech and language. No gross focal neurologic deficits are appreciated.  Skin:  Skin is warm, dry and intact.  As above Psychiatric: Mood and affect are normal. Speech and behavior are normal.  ____________________________________________   LABS (all labs ordered are listed, but only abnormal results are displayed)  Labs Reviewed - No data to display ____________________________________________  EKG  None ____________________________________________  RADIOLOGY  Knee x-ray reviewed by me, no evidence of loosening or fracture Ultrasound reviewed by me, no evidence of DVT ____________________________________________   PROCEDURES  Procedure(s) performed: No  Procedures   Critical Care performed: No ____________________________________________   INITIAL IMPRESSION / ASSESSMENT AND PLAN / ED COURSE  Pertinent labs & imaging results that were available during my care of the patient were reviewed by me and considered in my medical decision making (see chart for details).  Patient presents with complaints of bruising down her left leg, initial injury was to her left knee.  She is on Eliquis.  Exam is consistent with bruising due to gravity pulling hematoma subcutaneously, mild swelling, doubt DVT however she is concerned so will obtain ultrasound  Ultrasound negative for DVT, recommend supportive care, elevation, icing.    ____________________________________________   FINAL CLINICAL IMPRESSION(S) / ED DIAGNOSES  Final diagnoses:  Contusion of left knee, initial encounter  Hematoma and contusion        Note:  This document was prepared using Dragon voice recognition software and may include unintentional dictation errors.   Lavonia Drafts, MD 05/26/20 Bosie Helper

## 2020-07-06 ENCOUNTER — Other Ambulatory Visit: Payer: Self-pay | Admitting: Family

## 2020-07-20 ENCOUNTER — Ambulatory Visit: Payer: Self-pay | Admitting: Urology

## 2020-07-20 NOTE — Progress Notes (Signed)
Patient ID: Mandy Jones, female    DOB: 04-17-1951, 69 y.o.   MRN: [id number]  HPI  Ms Pangborn is a 69 y/o female with a history of atrial fibrillation, HTN, thyroid disease, GERD, sleep apnea, depression, fibromyalgia, hyperlipidemia, previous tobacco use and chronic heart failure.   Echo report from 02/10/20 reviewed and showed an EF of 55-60% along with mild LAE.   Was in the ED 05/26/20 due to left knee contusion where she was treated and released. Admitted 02/09/20 due to acute heart failure along with flank pain. Cardiology and urology consults obtained. Initially given IV lasix and then transitioned to oral diuretics. Elevated troponin thought to be due to demand ischemia. Cipro given for UTI. Discharged after 2 days.   She presents today for a follow-up visit with a chief complaint of minimal shortness of breath upon moderate exertion. She describes this as chronic in nature having been present for several years. She has associated fatigue, cough, pedal edema (improving), palpitations, difficulty sleeping and improving cellulitis along with this. She denies any dizziness, abdominal distention, chest tightness or weight gain.   Past Medical History:  Diagnosis Date  . Arrhythmia    atrial fibrillation  . Arthritis   . CHF (congestive heart failure) ([place])   . Depression   . Dyspnea   . Dysrhythmia   . Fibromyalgia 1992  . GERD (gastroesophageal reflux disease)   . Hepatitis    Hepatitis B 1985  . Hyperlipidemia   . Hypertension   . Mitral valve prolapse   . Sleep apnea    CPAP  . Tendon tear, ankle    rt foot  . Thyroid dysfunction    Past Surgical History:  Procedure Laterality Date  . ACHILLES TENDON SURGERY Right 04/06/2018   Procedure: ACHILLES TENDON REPAIR;  Surgeon: [name], DPM;  Location: [facility];  Service: Podiatry;  Laterality: Right;  . CARDIOVERSION N/A 02/06/2018   Procedure: CARDIOVERSION;  Surgeon: Corey Skains, MD;  Location: [facility];   Service: Cardiovascular;  Laterality: N/A;  . CARDIOVERSION N/A 03/28/2018   Procedure: CARDIOVERSION;  Surgeon: Corey Skains, MD;  Location: [facility];  Service: Cardiovascular;  Laterality: N/A;  . CARDIOVERSION N/A 05/09/2018   Procedure: CARDIOVERSION;  Surgeon: Corey Skains, MD;  Location: [facility];  Service: Cardiovascular;  Laterality: N/A;  . CARDIOVERSION N/A 10/23/2019   Procedure: CARDIOVERSION;  Surgeon: Corey Skains, MD;  Location: [facility];  Service: Cardiovascular;  Laterality: N/A;  . CHOLECYSTECTOMY    . CONTINUOUS NERVE MONITORING N/A 08/03/2017   Procedure: LARYNGEAL NERVE MONITORING;  Surgeon: Carloyn Manner, MD;  Location: [facility];  Service: ENT;  Laterality: N/A;  . EXTRACORPOREAL SHOCK WAVE LITHOTRIPSY Right 02/13/2020   Procedure: EXTRACORPOREAL SHOCK WAVE LITHOTRIPSY (ESWL);  Surgeon: Billey Co, MD;  Location: [facility];  Service: Urology;  Laterality: Right;  . EYE SURGERY Bilateral   . FOOT SURGERY Left 2000  . JOINT REPLACEMENT     left knee  . OSTECTOMY Right 04/06/2018   Procedure: OSTECTOMY-HAGLUNDS/RECTROCALCANEAL;  Surgeon: [name], DPM;  Location: [facility];  Service: Podiatry;  Laterality: Right;  . SHOULDER ARTHROSCOPY Bilateral   . SHOULDER ARTHROSCOPY Left   . THYROIDECTOMY N/A 08/03/2017   Procedure: THYROIDECTOMY;  Surgeon: Carloyn Manner, MD;  Location: [facility];  Service: ENT;  Laterality: N/A;  . TONSILLECTOMY    . TUBAL LIGATION     No family history on file. Social History   Tobacco Use  . Smoking status:  Former Smoker  . Smokeless tobacco: Never Used  . Tobacco comment: quit 30 years ago  Substance Use Topics  . Alcohol use: Yes    Comment: occassional   Allergies  Allergen Reactions  . Tape Rash    Pads used for ablation left a rash for a couple of days.   Prior to Admission medications   Medication Sig Start Date End Date Taking? Authorizing Provider  albuterol (VENTOLIN HFA) 108 (90 Base) MCG/ACT  inhaler Inhale 1-2 puffs into the lungs every 6 (six) hours as needed for wheezing or shortness of breath.   Yes [provider]  apixaban (ELIQUIS) 5 MG TABS tablet Take 1 tablet (5 mg total) by mouth 2 (two) times daily. 02/14/20  Yes Val Riles, MD  Ascorbic Acid (VITAMIN C) 1000 MG tablet Take 1,000 mg by mouth every evening.   Yes [provider]  azelastine (ASTELIN) 0.1 % nasal spray Place 1 spray into both nostrils daily as needed for rhinitis or allergies.    Yes [provider]  B Complex-C (B-COMPLEX WITH VITAMIN C) tablet Take 1 tablet by mouth daily.   Yes [provider]  cholecalciferol (VITAMIN D) 25 MCG (1000 UNIT) tablet Take 1,000 Units by mouth at bedtime.   Yes [provider]  cycloSPORINE (RESTASIS) 0.05 % ophthalmic emulsion Place 1 drop into both eyes 2 (two) times daily.    Yes [provider]  ENTRESTO 24-26 MG TAKE 1 TABLET BY MOUTH 2 (TWO) TIMES DAILY. 07/06/20  Yes [place], Otila Kluver A, FNP  famotidine (PEPCID) 20 MG tablet Take 40 mg by mouth at bedtime. 10/09/19  Yes [provider]  furosemide (LASIX) 40 MG tablet Take 1 tablet (40 mg total) by mouth 2 (two) times daily. 02/11/20  Yes Val Riles, MD  hydrOXYzine (ATARAX/VISTARIL) 25 MG tablet Take 25-50 mg by mouth daily as needed for anxiety. 06/21/19  Yes [provider]  levothyroxine (SYNTHROID) 150 MCG tablet Take 150 mcg by mouth every Sunday. On an empty stomach 10/15/19  Yes [provider]  levothyroxine (SYNTHROID) 175 MCG tablet Take 175 mcg by mouth See admin instructions. Take 1 tablet (175 mcg) by mouth on Mondays, Tuesdays, Wednesdays, Thursdays, Fridays, & Saturdays in the morning on a empty stomach. 10/09/19  Yes [provider]  Melatonin 5 MG TABS Take 5 mg by mouth at bedtime as needed (sleep).    Yes [provider]  mirtazapine (REMERON) 15 MG tablet Take 15 mg by mouth at bedtime as needed for sleep or  pain. 10/03/19  Yes [provider]  Multiple Vitamins-Minerals (PRESERVISION AREDS 2+MULTI VIT) CAPS Take 1 capsule by mouth in the morning and at bedtime.   Yes [provider]  traZODone (DESYREL) 50 MG tablet Take 100 mg by mouth at bedtime as needed for sleep.  12/04/17  Yes [provider]  valACYclovir (VALTREX) 1000 MG tablet Take 2,000 mg by mouth 2 (two) times daily.   Yes [provider]    Review of Systems  Constitutional: Positive for fatigue. Negative for appetite change.  HENT: Negative for congestion and postnasal drip.   Eyes: Negative.   Respiratory: Positive for cough and shortness of breath (improving). Negative for chest tightness.   Cardiovascular: Positive for palpitations ("twinges") and leg swelling (left leg improving). Negative for chest pain.  Gastrointestinal: Negative for abdominal distention and abdominal pain.  Endocrine: Negative.   Genitourinary: Negative.   Musculoskeletal: Positive for arthralgias (left knee). Negative for back pain  and neck pain.  Skin: Negative.   Allergic/Immunologic: Negative.   Neurological: Negative for dizziness and light-headedness.  Hematological: Negative for adenopathy. Does not bruise/bleed easily.  Psychiatric/Behavioral: Positive for sleep disturbance (wearing CPAP). Negative for dysphoric mood. The patient is not nervous/anxious.    Vitals:   07/21/20 1338  BP: 118/65  Pulse: 93  Resp: 20  SpO2: 96%  Weight: 220 lb (99.8 kg)  Height: 5\' 3"  (1.6 m)   Wt Readings from Last 3 Encounters:  07/21/20 220 lb (99.8 kg)  05/26/20 220 lb (99.8 kg)  05/13/20 214 lb (97.1 kg)   Lab Results  Component Value Date   CREATININE 0.48 04/23/2020   CREATININE 0.70 02/11/2020   CREATININE 0.63 02/10/2020    Physical Exam Vitals and nursing note reviewed.  Constitutional:      Appearance: She is well-developed.  HENT:     Head: Normocephalic and atraumatic.  Neck:     Vascular: No JVD.   Cardiovascular:     Rate and Rhythm: Normal rate and regular rhythm.  Pulmonary:     Effort: Pulmonary effort is normal. No respiratory distress.     Breath sounds: No wheezing or rales.  Abdominal:     Palpations: Abdomen is soft.     Tenderness: There is no abdominal tenderness.  Musculoskeletal:     Cervical back: Normal range of motion and neck supple.     Right lower leg: No tenderness. No edema.     Left lower leg: No tenderness. Edema (1+ pitting) present.  Skin:    General: Skin is warm and dry.  Neurological:     General: No focal deficit present.     Mental Status: She is alert and oriented to person, place, and time.  Psychiatric:        Mood and Affect: Mood is anxious. Affect is not tearful.        Behavior: Behavior normal.    Assessment & Plan:  1: Chronic heart failure with preserved ejection fraction with structural changes- - NYHA class II - euvolemic today - weighing daily; reminded to call for an overnight weight gain of >2 pounds or a weekly weight gain of >5 pounds - weight stable from last visit here 3 months ago - not adding salt except to pasta water - saw cardiology Nehemiah Massed) 04/09/20 - encouraged her to elevate her legs when sitting for long periods of time; does wear compression socks but doesn't have them on today - BNP 02/09/20 was 232.6 - received her flu vaccine for this season - received 2 covid vaccines and booster vaccine  2: HTN- - BP looks good today - saw PCP [name]) 06/11/20 - BMP 06/03/20 reviewed and showed sodium 140, potassium 4.4, creatinine 0.5 and GFR 97  3: Atrial fibrillation- - previous ablation done - feels "twinges" at time   Patient did not bring her medications nor a list. Each medication was verbally reviewed with the patient and she was encouraged to bring the bottles to every visit to confirm accuracy of list.  Due to HF stability, will not make a return appointment for patient at this time. Advised patient that  she could call back at anytime to schedule another appointment and she was comfortable with this plan.

## 2020-07-21 ENCOUNTER — Encounter: Payer: Self-pay | Admitting: Family

## 2020-07-21 ENCOUNTER — Ambulatory Visit: Payer: Medicare HMO | Attending: Family | Admitting: Family

## 2020-07-21 ENCOUNTER — Other Ambulatory Visit: Payer: Self-pay

## 2020-07-21 VITALS — BP 118/65 | HR 93 | Resp 20 | Ht 63.0 in | Wt 220.0 lb

## 2020-07-21 DIAGNOSIS — K219 Gastro-esophageal reflux disease without esophagitis: Secondary | ICD-10-CM | POA: Diagnosis not present

## 2020-07-21 DIAGNOSIS — G4730 Sleep apnea, unspecified: Secondary | ICD-10-CM | POA: Insufficient documentation

## 2020-07-21 DIAGNOSIS — Z87891 Personal history of nicotine dependence: Secondary | ICD-10-CM | POA: Insufficient documentation

## 2020-07-21 DIAGNOSIS — I10 Essential (primary) hypertension: Secondary | ICD-10-CM

## 2020-07-21 DIAGNOSIS — I110 Hypertensive heart disease with heart failure: Secondary | ICD-10-CM | POA: Diagnosis present

## 2020-07-21 DIAGNOSIS — E890 Postprocedural hypothyroidism: Secondary | ICD-10-CM | POA: Insufficient documentation

## 2020-07-21 DIAGNOSIS — Z79899 Other long term (current) drug therapy: Secondary | ICD-10-CM | POA: Insufficient documentation

## 2020-07-21 DIAGNOSIS — M797 Fibromyalgia: Secondary | ICD-10-CM | POA: Insufficient documentation

## 2020-07-21 DIAGNOSIS — E785 Hyperlipidemia, unspecified: Secondary | ICD-10-CM | POA: Insufficient documentation

## 2020-07-21 DIAGNOSIS — Z888 Allergy status to other drugs, medicaments and biological substances status: Secondary | ICD-10-CM | POA: Insufficient documentation

## 2020-07-21 DIAGNOSIS — Z7901 Long term (current) use of anticoagulants: Secondary | ICD-10-CM | POA: Diagnosis not present

## 2020-07-21 DIAGNOSIS — I5032 Chronic diastolic (congestive) heart failure: Secondary | ICD-10-CM | POA: Diagnosis not present

## 2020-07-21 DIAGNOSIS — I4891 Unspecified atrial fibrillation: Secondary | ICD-10-CM | POA: Diagnosis not present

## 2020-07-21 DIAGNOSIS — F32A Depression, unspecified: Secondary | ICD-10-CM | POA: Diagnosis not present

## 2020-07-21 DIAGNOSIS — I480 Paroxysmal atrial fibrillation: Secondary | ICD-10-CM

## 2020-07-21 DIAGNOSIS — Z79890 Hormone replacement therapy (postmenopausal): Secondary | ICD-10-CM | POA: Insufficient documentation

## 2020-07-21 DIAGNOSIS — Z9989 Dependence on other enabling machines and devices: Secondary | ICD-10-CM | POA: Diagnosis not present

## 2020-07-21 DIAGNOSIS — Z9049 Acquired absence of other specified parts of digestive tract: Secondary | ICD-10-CM | POA: Insufficient documentation

## 2020-07-21 NOTE — Patient Instructions (Addendum)
Continue weighing daily and call for an overnight weight gain of > 2 pounds or a weekly weight gain of >5 pounds.   Call us in the future if you'd like to schedule another appointment 

## 2020-09-11 ENCOUNTER — Telehealth: Payer: Self-pay | Admitting: Family

## 2020-09-11 ENCOUNTER — Other Ambulatory Visit
Admission: RE | Admit: 2020-09-11 | Discharge: 2020-09-11 | Disposition: A | Discharge status: Home / Self Care | Payer: Medicare HMO | Source: Ambulatory Visit | Attending: Cardiology | Admitting: Cardiology

## 2020-09-11 DIAGNOSIS — I5033 Acute on chronic diastolic (congestive) heart failure: Secondary | ICD-10-CM | POA: Insufficient documentation

## 2020-09-11 DIAGNOSIS — R0600 Dyspnea, unspecified: Secondary | ICD-10-CM | POA: Insufficient documentation

## 2020-09-11 DIAGNOSIS — I480 Paroxysmal atrial fibrillation: Secondary | ICD-10-CM | POA: Insufficient documentation

## 2020-09-11 LAB — BRAIN NATRIURETIC PEPTIDE: B Natriuretic Peptide: 210.3 pg/mL — ABNORMAL HIGH (ref 0.0–100.0)

## 2020-09-11 NOTE — Telephone Encounter (Signed)
Patient called and complained about SOB and a 5 to 8lbs weight gain over past week. Otila Kluver is out the next few days so I have her scheduled to see Dr. Nehemiah Massed at [place] today.   Alazia Crocket, NT

## 2020-09-28 ENCOUNTER — Other Ambulatory Visit
Admission: RE | Admit: 2020-09-28 | Discharge: 2020-09-28 | Disposition: A | Discharge status: Home / Self Care | Payer: Medicare HMO | Source: Ambulatory Visit | Attending: Internal Medicine | Admitting: Internal Medicine

## 2020-09-28 ENCOUNTER — Other Ambulatory Visit: Payer: Self-pay

## 2020-09-28 DIAGNOSIS — Z01812 Encounter for preprocedural laboratory examination: Secondary | ICD-10-CM | POA: Insufficient documentation

## 2020-09-28 DIAGNOSIS — Z20822 Contact with and (suspected) exposure to covid-19: Secondary | ICD-10-CM | POA: Diagnosis not present

## 2020-09-28 LAB — SARS CORONAVIRUS 2 (TAT 6-24 HRS): SARS Coronavirus 2: NEGATIVE

## 2020-09-30 ENCOUNTER — Ambulatory Visit: Payer: Medicare HMO | Admitting: Anesthesiology

## 2020-09-30 ENCOUNTER — Encounter: Payer: Self-pay | Admitting: Internal Medicine

## 2020-09-30 ENCOUNTER — Ambulatory Visit
Admission: RE | Admit: 2020-09-30 | Discharge: 2020-09-30 | Disposition: A | Discharge status: Home / Self Care | Payer: Medicare HMO | Attending: Internal Medicine | Admitting: Internal Medicine

## 2020-09-30 ENCOUNTER — Other Ambulatory Visit: Payer: Self-pay

## 2020-09-30 ENCOUNTER — Encounter
Admission: RE | Disposition: A | Discharge status: Home / Self Care | Payer: Self-pay | Source: Home / Self Care | Attending: Internal Medicine

## 2020-09-30 DIAGNOSIS — I5042 Chronic combined systolic (congestive) and diastolic (congestive) heart failure: Secondary | ICD-10-CM | POA: Diagnosis not present

## 2020-09-30 DIAGNOSIS — G4733 Obstructive sleep apnea (adult) (pediatric): Secondary | ICD-10-CM | POA: Diagnosis not present

## 2020-09-30 DIAGNOSIS — Z79890 Hormone replacement therapy (postmenopausal): Secondary | ICD-10-CM | POA: Insufficient documentation

## 2020-09-30 DIAGNOSIS — E782 Mixed hyperlipidemia: Secondary | ICD-10-CM | POA: Diagnosis not present

## 2020-09-30 DIAGNOSIS — Z79899 Other long term (current) drug therapy: Secondary | ICD-10-CM | POA: Insufficient documentation

## 2020-09-30 DIAGNOSIS — I341 Nonrheumatic mitral (valve) prolapse: Secondary | ICD-10-CM | POA: Insufficient documentation

## 2020-09-30 DIAGNOSIS — I4892 Unspecified atrial flutter: Secondary | ICD-10-CM | POA: Insufficient documentation

## 2020-09-30 DIAGNOSIS — I6523 Occlusion and stenosis of bilateral carotid arteries: Secondary | ICD-10-CM | POA: Insufficient documentation

## 2020-09-30 DIAGNOSIS — I2510 Atherosclerotic heart disease of native coronary artery without angina pectoris: Secondary | ICD-10-CM | POA: Diagnosis not present

## 2020-09-30 DIAGNOSIS — Z96651 Presence of right artificial knee joint: Secondary | ICD-10-CM | POA: Diagnosis not present

## 2020-09-30 DIAGNOSIS — I110 Hypertensive heart disease with heart failure: Secondary | ICD-10-CM | POA: Insufficient documentation

## 2020-09-30 DIAGNOSIS — Z8249 Family history of ischemic heart disease and other diseases of the circulatory system: Secondary | ICD-10-CM | POA: Insufficient documentation

## 2020-09-30 DIAGNOSIS — R0602 Shortness of breath: Secondary | ICD-10-CM | POA: Diagnosis not present

## 2020-09-30 DIAGNOSIS — Z7901 Long term (current) use of anticoagulants: Secondary | ICD-10-CM | POA: Diagnosis not present

## 2020-09-30 DIAGNOSIS — Z87891 Personal history of nicotine dependence: Secondary | ICD-10-CM | POA: Insufficient documentation

## 2020-09-30 DIAGNOSIS — I480 Paroxysmal atrial fibrillation: Secondary | ICD-10-CM | POA: Diagnosis present

## 2020-09-30 HISTORY — PX: CARDIOVERSION: SHX1299

## 2020-09-30 SURGERY — CARDIOVERSION
Anesthesia: General

## 2020-09-30 MED ORDER — PROPOFOL 10 MG/ML IV BOLUS
INTRAVENOUS | Status: DC | PRN
  Administered 2020-09-30 (×3): 20 mg via INTRAVENOUS
  Administered 2020-09-30: 50 mg via INTRAVENOUS
  Administered 2020-09-30: 20 mg via INTRAVENOUS

## 2020-09-30 MED ORDER — SODIUM CHLORIDE 0.9 % IV SOLN: INTRAVENOUS | Status: DC | PRN

## 2020-09-30 MED ORDER — PROPOFOL 10 MG/ML IV BOLUS
INTRAVENOUS | Status: AC
  Filled 2020-09-30: qty 20

## 2020-09-30 NOTE — Transfer of Care (Signed)
Immediate Anesthesia Transfer of Care Note  Patient: Mandy Jones [facility]  Procedure(s) Performed: CARDIOVERSION (N/A )  Patient Location: Special Procedures  Anesthesia Type:General  Level of Consciousness: drowsy  Airway & Oxygen Therapy: Patient Spontanous Breathing and Patient connected to nasal cannula oxygen  Post-op Assessment: Report given to RN and Post -op Vital signs reviewed and stable  Post vital signs: Reviewed and stable  Last Vitals:  Vitals Value Taken Time  BP 120/92 (102)   Temp    Pulse 67 09/30/20 0806  Resp 17 09/30/20 0806  SpO2 99 % 09/30/20 0806  Vitals shown include unvalidated device data.  Last Pain:  Vitals:   09/30/20 0741  TempSrc: Oral         Complications: No complications documented.

## 2020-09-30 NOTE — CV Procedure (Signed)
Electrical Cardioversion Procedure Note Mandy Jones [facility] [id number] June 18, 1951  Procedure: Electrical Cardioversion Indications:  Paroxysmal non valvular atrial fibrillation  Procedure Details Consent: Risks of procedure as well as the alternatives and risks of each were explained to the (patient/caregiver).  Consent for procedure obtained. Time Out: Verified patient identification, verified procedure, site/side was marked, verified correct patient position, special equipment/implants available, medications/allergies/relevent history reviewed, required imaging and test results available.  Performed  Patient placed on cardiac monitor, pulse oximetry, supplemental oxygen as necessary.  Sedation given: Propofol and versed as per anesthesia  Pacer pads placed anterior and posterior chest.  Cardioverted 2 time(s).  Cardioverted at 150J.  Evaluation Findings: Post procedure EKG shows: NSR Complications: None Patient did tolerate procedure well.   Mandy Jones M.D. [facility] 09/30/2020, 8:56 AM

## 2020-09-30 NOTE — Anesthesia Preprocedure Evaluation (Signed)
Anesthesia Evaluation  Patient identified by MRN, date of birth, ID band Patient awake    Reviewed: Allergy & Precautions, H&P , NPO status , reviewed documented beta blocker date and time   Airway Mallampati: III  TM Distance: >3 FB Neck ROM: full    Dental  (+) Chipped   Pulmonary shortness of breath, sleep apnea and Continuous Positive Airway Pressure Ventilation , former smoker,    Pulmonary exam normal        Cardiovascular hypertension, +CHF  + dysrhythmias Atrial Fibrillation  Rhythm:irregular     Neuro/Psych PSYCHIATRIC DISORDERS Depression  Neuromuscular disease    GI/Hepatic GERD  Medicated and Controlled,(+) Hepatitis -  Endo/Other  Hypothyroidism Morbid obesity  Renal/GU      Musculoskeletal  (+) Arthritis , Fibromyalgia -  Abdominal   Peds  Hematology   Anesthesia Other Findings Past Medical History: No date: Arthritis No date: Depression No date: Dyspnea No date: Dysrhythmia 1992: Fibromyalgia No date: GERD (gastroesophageal reflux disease) No date: Hepatitis     Comment:  Hepatitis B 1985 No date: Mitral valve prolapse No date: Sleep apnea     Comment:  CPAP No date: Tendon tear, ankle     Comment:  rt foot No date: Thyroid dysfunction  Past Surgical History: 04/06/2018: ACHILLES TENDON SURGERY; Right     Comment:  Procedure: ACHILLES TENDON REPAIR;  Surgeon: Fowler,               Justin, DPM;  Location: [facility];  Service: Podiatry;                Laterality: Right; 02/06/2018: CARDIOVERSION; N/A     Comment:  Procedure: CARDIOVERSION;  Surgeon: Kowalski, Bruce J,               MD;  Location: [facility];  Service: Cardiovascular;                Laterality: N/A; 03/28/2018: CARDIOVERSION; N/A     Comment:  Procedure: CARDIOVERSION;  Surgeon: Kowalski, Bruce J,               MD;  Location: [facility];  Service: Cardiovascular;                Laterality: N/A; No date: CHOLECYSTECTOMY 08/03/2017:  CONTINUOUS NERVE MONITORING; N/A     Comment:  Procedure: LARYNGEAL NERVE MONITORING;  Surgeon: Vaught,              Creighton, MD;  Location: [facility];  Service: ENT;                Laterality: N/A; No date: EYE SURGERY; Bilateral 2000: FOOT SURGERY; Left No date: JOINT REPLACEMENT     Comment:  left knee 04/06/2018: OSTECTOMY; Right     Comment:  Procedure: OSTECTOMY-HAGLUNDS/RECTROCALCANEAL;  Surgeon:              Fowler, Justin, DPM;  Location: [facility];  Service:               Podiatry;  Laterality: Right; No date: SHOULDER ARTHROSCOPY; Bilateral No date: SHOULDER ARTHROSCOPY; Left 08/03/2017: THYROIDECTOMY; N/A     Comment:  Procedure: THYROIDECTOMY;  Surgeon: Vaught, Creighton,               MD;  Location: [facility];  Service: ENT;  Laterality: N/A; No date: TONSILLECTOMY No date: TUBAL LIGATION     Reproductive/Obstetrics                               Anesthesia Physical  Anesthesia Plan  ASA: III  Anesthesia Plan: General   Post-op Pain Management:    Induction: Intravenous  PONV Risk Score and Plan: 3 and Treatment may vary due to age or medical condition, TIVA and Propofol infusion  Airway Management Planned: Nasal Cannula and Natural Airway  Additional Equipment:   Intra-op Plan:   Post-operative Plan:   Informed Consent: I have reviewed the patients History and Physical, chart, labs and discussed the procedure including the risks, benefits and alternatives for the proposed anesthesia with the patient or authorized representative who has indicated his/her understanding and acceptance.     Dental Advisory Given  Plan Discussed with: CRNA  Anesthesia Plan Comments:         Anesthesia Quick Evaluation  

## 2020-10-12 NOTE — Anesthesia Postprocedure Evaluation (Signed)
Anesthesia Post Note  Patient: Mandy Jones [facility]  Procedure(s) Performed: CARDIOVERSION (N/A )  Patient location during evaluation: Specials Recovery Anesthesia Type: General Level of consciousness: awake and alert and oriented Pain management: pain level controlled Vital Signs Assessment: post-procedure vital signs reviewed and stable Respiratory status: spontaneous breathing Cardiovascular status: blood pressure returned to baseline Anesthetic complications: no   No complications documented.   Last Vitals:  Vitals:   09/30/20 0830 09/30/20 0840  BP: 100/67   Pulse: 81 80  Resp: 13   Temp:    SpO2: 97%     Last Pain:  Vitals:   09/30/20 0840  TempSrc:   PainSc: 0-No pain                 Mandy Jones

## 2020-11-14 ENCOUNTER — Other Ambulatory Visit: Payer: Self-pay | Admitting: Family

## 2020-11-17 ENCOUNTER — Other Ambulatory Visit: Payer: Self-pay

## 2020-11-17 ENCOUNTER — Other Ambulatory Visit
Admission: RE | Admit: 2020-11-17 | Discharge: 2020-11-17 | Disposition: A | Discharge status: Home / Self Care | Payer: Medicare HMO | Source: Ambulatory Visit | Attending: Internal Medicine | Admitting: Internal Medicine

## 2020-11-17 DIAGNOSIS — Z01812 Encounter for preprocedural laboratory examination: Secondary | ICD-10-CM | POA: Insufficient documentation

## 2020-11-17 DIAGNOSIS — Z20822 Contact with and (suspected) exposure to covid-19: Secondary | ICD-10-CM | POA: Insufficient documentation

## 2020-11-18 LAB — SARS CORONAVIRUS 2 (TAT 6-24 HRS): SARS Coronavirus 2: NEGATIVE

## 2020-11-19 ENCOUNTER — Encounter
Admission: RE | Disposition: A | Discharge status: Home / Self Care | Payer: Self-pay | Source: Home / Self Care | Attending: Internal Medicine

## 2020-11-19 ENCOUNTER — Encounter: Payer: Self-pay | Admitting: Internal Medicine

## 2020-11-19 ENCOUNTER — Ambulatory Visit
Admission: RE | Admit: 2020-11-19 | Discharge: 2020-11-19 | Disposition: A | Discharge status: Home / Self Care | Payer: Medicare HMO | Attending: Internal Medicine | Admitting: Internal Medicine

## 2020-11-19 ENCOUNTER — Ambulatory Visit: Payer: Medicare HMO | Admitting: Anesthesiology

## 2020-11-19 ENCOUNTER — Other Ambulatory Visit: Payer: Self-pay

## 2020-11-19 DIAGNOSIS — Z961 Presence of intraocular lens: Secondary | ICD-10-CM | POA: Diagnosis not present

## 2020-11-19 DIAGNOSIS — Z8249 Family history of ischemic heart disease and other diseases of the circulatory system: Secondary | ICD-10-CM | POA: Insufficient documentation

## 2020-11-19 DIAGNOSIS — I480 Paroxysmal atrial fibrillation: Secondary | ICD-10-CM | POA: Diagnosis not present

## 2020-11-19 DIAGNOSIS — Z9842 Cataract extraction status, left eye: Secondary | ICD-10-CM | POA: Insufficient documentation

## 2020-11-19 DIAGNOSIS — Z79899 Other long term (current) drug therapy: Secondary | ICD-10-CM | POA: Diagnosis not present

## 2020-11-19 DIAGNOSIS — Z809 Family history of malignant neoplasm, unspecified: Secondary | ICD-10-CM | POA: Insufficient documentation

## 2020-11-19 DIAGNOSIS — Z7901 Long term (current) use of anticoagulants: Secondary | ICD-10-CM | POA: Insufficient documentation

## 2020-11-19 DIAGNOSIS — Z811 Family history of alcohol abuse and dependence: Secondary | ICD-10-CM | POA: Insufficient documentation

## 2020-11-19 DIAGNOSIS — Z6838 Body mass index (BMI) 38.0-38.9, adult: Secondary | ICD-10-CM | POA: Insufficient documentation

## 2020-11-19 DIAGNOSIS — Z9841 Cataract extraction status, right eye: Secondary | ICD-10-CM | POA: Diagnosis not present

## 2020-11-19 DIAGNOSIS — Z818 Family history of other mental and behavioral disorders: Secondary | ICD-10-CM | POA: Insufficient documentation

## 2020-11-19 DIAGNOSIS — Z87891 Personal history of nicotine dependence: Secondary | ICD-10-CM | POA: Insufficient documentation

## 2020-11-19 DIAGNOSIS — E890 Postprocedural hypothyroidism: Secondary | ICD-10-CM | POA: Insufficient documentation

## 2020-11-19 DIAGNOSIS — Z823 Family history of stroke: Secondary | ICD-10-CM | POA: Diagnosis not present

## 2020-11-19 DIAGNOSIS — Z79890 Hormone replacement therapy (postmenopausal): Secondary | ICD-10-CM | POA: Diagnosis not present

## 2020-11-19 DIAGNOSIS — Z8262 Family history of osteoporosis: Secondary | ICD-10-CM | POA: Diagnosis not present

## 2020-11-19 DIAGNOSIS — Z833 Family history of diabetes mellitus: Secondary | ICD-10-CM | POA: Diagnosis not present

## 2020-11-19 DIAGNOSIS — Z8261 Family history of arthritis: Secondary | ICD-10-CM | POA: Diagnosis not present

## 2020-11-19 HISTORY — PX: CARDIOVERSION: SHX1299

## 2020-11-19 SURGERY — CARDIOVERSION
Anesthesia: General

## 2020-11-19 MED ORDER — PROPOFOL 10 MG/ML IV BOLUS
INTRAVENOUS | Status: DC | PRN
  Administered 2020-11-19: 20 mg via INTRAVENOUS
  Administered 2020-11-19: 50 mg via INTRAVENOUS
  Administered 2020-11-19: 20 mg via INTRAVENOUS

## 2020-11-19 MED ORDER — AMIODARONE HCL 200 MG PO TABS: 200.0000 mg | ORAL_TABLET | Freq: Every day | ORAL | 4 refills | Status: DC

## 2020-11-19 MED ORDER — SODIUM CHLORIDE 0.9 % IV SOLN: INTRAVENOUS | Status: DC | PRN

## 2020-11-19 MED ORDER — FENTANYL CITRATE (PF) 100 MCG/2ML IJ SOLN: 25.0000 ug | INTRAMUSCULAR | Status: DC | PRN

## 2020-11-19 MED ORDER — ONDANSETRON HCL 4 MG/2ML IJ SOLN: 4.0000 mg | Freq: Once | INTRAMUSCULAR | Status: DC | PRN

## 2020-11-19 MED ORDER — PROPOFOL 10 MG/ML IV BOLUS
INTRAVENOUS | Status: AC
  Filled 2020-11-19: qty 20

## 2020-11-19 NOTE — OR Nursing (Signed)
PT cleared for d/c by Dr. Nehemiah Massed

## 2020-11-19 NOTE — Anesthesia Preprocedure Evaluation (Signed)
Anesthesia Evaluation  Patient identified by MRN, date of birth, ID band Patient awake    Reviewed: Allergy & Precautions, H&P , NPO status , reviewed documented beta blocker date and time   Airway Mallampati: III  TM Distance: >3 FB Neck ROM: full    Dental  (+) Chipped   Pulmonary shortness of breath, sleep apnea and Continuous Positive Airway Pressure Ventilation , former smoker,    Pulmonary exam normal        Cardiovascular hypertension, +CHF  + dysrhythmias Atrial Fibrillation  Rhythm:irregular     Neuro/Psych PSYCHIATRIC DISORDERS Depression  Neuromuscular disease    GI/Hepatic GERD  Medicated and Controlled,(+) Hepatitis -  Endo/Other  Hypothyroidism Morbid obesity  Renal/GU      Musculoskeletal  (+) Arthritis , Fibromyalgia -  Abdominal   Peds  Hematology   Anesthesia Other Findings Past Medical History: No date: Arthritis No date: Depression No date: Dyspnea No date: Dysrhythmia 1992: Fibromyalgia No date: GERD (gastroesophageal reflux disease) No date: Hepatitis     Comment:  Hepatitis B 1985 No date: Mitral valve prolapse No date: Sleep apnea     Comment:  CPAP No date: Tendon tear, ankle     Comment:  rt foot No date: Thyroid dysfunction  Past Surgical History: 04/06/2018: ACHILLES TENDON SURGERY; Right     Comment:  Procedure: ACHILLES TENDON REPAIR;  Surgeon: [name], DPM;  Location: [facility];  Service: Podiatry;                Laterality: Right; 02/06/2018: CARDIOVERSION; N/A     Comment:  Procedure: CARDIOVERSION;  Surgeon: Corey Skains,               MD;  Location: [facility];  Service: Cardiovascular;                Laterality: N/A; 03/28/2018: CARDIOVERSION; N/A     Comment:  Procedure: CARDIOVERSION;  Surgeon: Corey Skains,               MD;  Location: [facility];  Service: Cardiovascular;                Laterality: N/A; No date: CHOLECYSTECTOMY 08/03/2017:  CONTINUOUS NERVE MONITORING; N/A     Comment:  Procedure: LARYNGEAL NERVE MONITORING;  Surgeon: Carloyn Manner, MD;  Location: [facility];  Service: ENT;                Laterality: N/A; No date: EYE SURGERY; Bilateral 2000: FOOT SURGERY; Left No date: JOINT REPLACEMENT     Comment:  left knee 04/06/2018: OSTECTOMY; Right     Comment:  Procedure: OSTECTOMY-HAGLUNDS/RECTROCALCANEAL;  Surgeon:              [name], DPM;  Location: [facility];  Service:               Podiatry;  Laterality: Right; No date: SHOULDER ARTHROSCOPY; Bilateral No date: SHOULDER ARTHROSCOPY; Left 08/03/2017: THYROIDECTOMY; N/A     Comment:  Procedure: THYROIDECTOMY;  Surgeon: Carloyn Manner,               MD;  Location: [facility];  Service: ENT;  Laterality: N/A; No date: TONSILLECTOMY No date: TUBAL LIGATION     Reproductive/Obstetrics  Anesthesia Physical  Anesthesia Plan  ASA: III  Anesthesia Plan: General   Post-op Pain Management:    Induction: Intravenous  PONV Risk Score and Plan: 3 and Treatment may vary due to age or medical condition, TIVA and Propofol infusion  Airway Management Planned: Nasal Cannula and Natural Airway  Additional Equipment:   Intra-op Plan:   Post-operative Plan:   Informed Consent: I have reviewed the patients History and Physical, chart, labs and discussed the procedure including the risks, benefits and alternatives for the proposed anesthesia with the patient or authorized representative who has indicated his/her understanding and acceptance.     Dental Advisory Given  Plan Discussed with: CRNA  Anesthesia Plan Comments:         Anesthesia Quick Evaluation

## 2020-11-19 NOTE — CV Procedure (Signed)
Electrical Cardioversion Procedure Note Ahmani Daoud [facility] [id number] 11/03/1950  Procedure: Electrical Cardioversion Indications:  Paroxysmal non valvular atrial fibrillation  Procedure Details Consent: Risks of procedure as well as the alternatives and risks of each were explained to the (patient/caregiver).  Consent for procedure obtained. Time Out: Verified patient identification, verified procedure, site/side was marked, verified correct patient position, special equipment/implants available, medications/allergies/relevent history reviewed, required imaging and test results available.  Performed  Patient placed on cardiac monitor, pulse oximetry, supplemental oxygen as necessary.  Sedation given: Propofol and versed as per anesthesia  Pacer pads placed anterior and posterior chest.  Cardioverted 1 time(s).  Cardioverted at 120J.  Evaluation Findings: Post procedure EKG shows: NSR Complications: None Patient did tolerate procedure well.   Serafina Royals M.D. [facility] 11/19/2020, 9:04 AM

## 2020-11-19 NOTE — Discharge Instructions (Signed)
Electrical Cardioversion Electrical cardioversion is the delivery of a jolt of electricity to restore a normal rhythm to the heart. A rhythm that is too fast or is not regular keeps the heart from pumping well. In this procedure, sticky patches or metal paddles are placed on the chest to deliver electricity to the heart from a device. This procedure may be done in an emergency if:  There is low or no blood pressure as a result of the heart rhythm.  Normal rhythm must be restored as fast as possible to protect the brain and heart from further damage.  It may save a life. This may also be a scheduled procedure for irregular or fast heart rhythms that are not immediately life-threatening. Tell a health care provider about:  Any allergies you have.  All medicines you are taking, including vitamins, herbs, eye drops, creams, and over-the-counter medicines.  Any problems you or family members have had with anesthetic medicines.  Any blood disorders you have.  Any surgeries you have had.  Any medical conditions you have.  Whether you are pregnant or may be pregnant. What are the risks? Generally, this is a safe procedure. However, problems may occur, including:  Allergic reactions to medicines.  A blood clot that breaks free and travels to other parts of your body.  The possible return of an abnormal heart rhythm within hours or days after the procedure.  Your heart stopping (cardiac arrest). This is rare. What happens before the procedure? Medicines  Your health care provider may have you start taking: ? Blood-thinning medicines (anticoagulants) so your blood does not clot as easily. ? Medicines to help stabilize your heart rate and rhythm.  Ask your health care provider about: ? Changing or stopping your regular medicines. This is especially important if you are taking diabetes medicines or blood thinners. ? Taking medicines such as aspirin and ibuprofen. These medicines can  thin your blood. Do not take these medicines unless your health care provider tells you to take them. ? Taking over-the-counter medicines, vitamins, herbs, and supplements. General instructions  Follow instructions from your health care provider about eating or drinking restrictions.  Plan to have someone take you home from the hospital or clinic.  If you will be going home right after the procedure, plan to have someone with you for 24 hours.  Ask your health care provider what steps will be taken to help prevent infection. These may include washing your skin with a germ-killing soap. What happens during the procedure?  An IV will be inserted into one of your veins.  Sticky patches (electrodes) or metal paddles may be placed on your chest.  You will be given a medicine to help you relax (sedative).  An electrical shock will be delivered. The procedure may vary among health care providers and hospitals.   What can I expect after the procedure?  Your blood pressure, heart rate, breathing rate, and blood oxygen level will be monitored until you leave the hospital or clinic.  Your heart rhythm will be watched to make sure it does not change.  You may have some redness on the skin where the shocks were given. Follow these instructions at home:  Do not drive for 24 hours if you were given a sedative during your procedure.  Take over-the-counter and prescription medicines only as told by your health care provider.  Ask your health care provider how to check your pulse. Check it often.  Rest for 48 hours after the procedure   or as told by your health care provider.  Avoid or limit your caffeine use as told by your health care provider.  Keep all follow-up visits as told by your health care provider. This is important. Contact a health care provider if:  You feel like your heart is beating too quickly or your pulse is not regular.  You have a serious muscle cramp that does not go  away. Get help right away if:  You have discomfort in your chest.  You are dizzy or you feel faint.  You have trouble breathing or you are short of breath.  Your speech is slurred.  You have trouble moving an arm or leg on one side of your body.  Your fingers or toes turn cold or blue. Summary  Electrical cardioversion is the delivery of a jolt of electricity to restore a normal rhythm to the heart.  This procedure may be done right away in an emergency or may be a scheduled procedure if the condition is not an emergency.  Generally, this is a safe procedure.  After the procedure, check your pulse often as told by your health care provider. This information is not intended to replace advice given to you by your health care provider. Make sure you discuss any questions you have with your health care provider. Document Revised: 03/18/2019 Document Reviewed: 03/18/2019 Elsevier Patient Education  2021 Elsevier Inc.  

## 2020-11-19 NOTE — Anesthesia Procedure Notes (Signed)
Procedure Name: MAC Date/Time: 11/12/2020 8:00 AM Performed by: Alvin Critchley, MD Pre-anesthesia Checklist: Patient identified, Emergency Drugs available, Suction available and Patient being monitored Patient Re-evaluated:Patient Re-evaluated prior to induction Oxygen Delivery Method: Nasal cannula

## 2020-11-19 NOTE — OR Nursing (Signed)
PT up to bedside chair and eating per Dr. Nehemiah Massed

## 2020-11-19 NOTE — Transfer of Care (Signed)
Immediate Anesthesia Transfer of Care Note  Patient: [facility]  Procedure(s) Performed: CARDIOVERSION (N/A )  Patient Location: PACU and [facility]  Anesthesia Type:General  Level of Consciousness: awake, drowsy and patient cooperative  Airway & Oxygen Therapy: Patient Spontanous Breathing and Patient connected to nasal cannula oxygen  Post-op Assessment: Report given to RN and Post -op Vital signs reviewed and stable  Post vital signs: Reviewed and stable  Last Vitals:  Vitals Value Taken Time  BP 93/50 11/19/20 0815  Temp    Pulse 33 11/19/20 0814  Resp 22 11/19/20 0817  SpO2 98 % 11/19/20 0814    Last Pain:  Vitals:   11/19/20 0720  PainSc: 0-No pain         Complications: No complications documented.

## 2020-11-23 NOTE — Anesthesia Postprocedure Evaluation (Signed)
Anesthesia Post Note  Patient: Mandy Jones [facility]  Procedure(s) Performed: CARDIOVERSION (N/A )  Patient location during evaluation: PACU Anesthesia Type: General Level of consciousness: awake and alert and oriented Pain management: pain level controlled Vital Signs Assessment: post-procedure vital signs reviewed and stable Respiratory status: spontaneous breathing Cardiovascular status: blood pressure returned to baseline Anesthetic complications: no   No complications documented.   Last Vitals:  Vitals:   11/19/20 1030 11/19/20 1100  BP:  (!) 94/49  Pulse: 82   Resp: 15 15  Temp:    SpO2: 95% 95%    Last Pain:  Vitals:   11/19/20 1100  PainSc: 0-No pain                 Mubarak Bevens

## 2020-11-30 ENCOUNTER — Ambulatory Visit: Payer: Medicare HMO | Attending: Otolaryngology

## 2020-11-30 ENCOUNTER — Other Ambulatory Visit: Payer: Self-pay

## 2020-11-30 DIAGNOSIS — R2681 Unsteadiness on feet: Secondary | ICD-10-CM | POA: Diagnosis present

## 2020-11-30 NOTE — Therapy (Signed)
[facility] [address] [place], [place], [address] Phone: [phone]   Fax:  [phone]  Physical Therapy Evaluation  Patient Details  Name: Mandy Jones [facility] MRN: [id number] Date of Birth: 1951/03/06 Referring Provider (PT): Carloyn Manner   Encounter Date: 11/30/2020   PT End of Session - 11/30/20 2258    Visit Number 1    Number of Visits 17    Date for PT Re-Evaluation 01/25/21    Authorization Type eval: 11/30/20    PT Start Time 1107    PT Stop Time 1145    PT Time Calculation (min) 38 min    Activity Tolerance Patient tolerated treatment well    Behavior During Therapy [facility] for tasks assessed/performed           Past Medical History:  Diagnosis Date  . Arrhythmia    atrial fibrillation  . Arthritis   . CHF (congestive heart failure) ([place])   . Depression   . Dyspnea   . Dysrhythmia   . Fibromyalgia 1992  . GERD (gastroesophageal reflux disease)   . Hepatitis    Hepatitis B 1985  . Hyperlipidemia   . Hypertension   . Mitral valve prolapse   . Sleep apnea    CPAP  . Tendon tear, ankle    rt foot  . Thyroid dysfunction     Past Surgical History:  Procedure Laterality Date  . ACHILLES TENDON SURGERY Right 04/06/2018   Procedure: ACHILLES TENDON REPAIR;  Surgeon: [name], DPM;  Location: [facility] ORS;  Service: Podiatry;  Laterality: Right;  . CARDIOVERSION N/A 02/06/2018   Procedure: CARDIOVERSION;  Surgeon: Corey Skains, MD;  Location: [facility] ORS;  Service: Cardiovascular;  Laterality: N/A;  . CARDIOVERSION N/A 03/28/2018   Procedure: CARDIOVERSION;  Surgeon: Corey Skains, MD;  Location: [facility] ORS;  Service: Cardiovascular;  Laterality: N/A;  . CARDIOVERSION N/A 05/09/2018   Procedure: CARDIOVERSION;  Surgeon: Corey Skains, MD;  Location: [facility] ORS;  Service: Cardiovascular;  Laterality: N/A;  . CARDIOVERSION N/A 10/23/2019   Procedure: CARDIOVERSION;  Surgeon: Corey Skains, MD;   Location: [facility] ORS;  Service: Cardiovascular;  Laterality: N/A;  . CARDIOVERSION N/A 09/30/2020   Procedure: CARDIOVERSION;  Surgeon: Corey Skains, MD;  Location: [facility] ORS;  Service: Cardiovascular;  Laterality: N/A;  . CARDIOVERSION N/A 11/19/2020   Procedure: CARDIOVERSION;  Surgeon: Corey Skains, MD;  Location: [facility] ORS;  Service: Cardiovascular;  Laterality: N/A;  . CHOLECYSTECTOMY    . CONTINUOUS NERVE MONITORING N/A 08/03/2017   Procedure: LARYNGEAL NERVE MONITORING;  Surgeon: Carloyn Manner, MD;  Location: [facility] ORS;  Service: ENT;  Laterality: N/A;  . EXTRACORPOREAL SHOCK WAVE LITHOTRIPSY Right 02/13/2020   Procedure: EXTRACORPOREAL SHOCK WAVE LITHOTRIPSY (ESWL);  Surgeon: Billey Co, MD;  Location: [facility] ORS;  Service: Urology;  Laterality: Right;  . EYE SURGERY Bilateral   . FOOT SURGERY Left 2000  . JOINT REPLACEMENT     left knee  . OSTECTOMY Right 04/06/2018   Procedure: OSTECTOMY-HAGLUNDS/RECTROCALCANEAL;  Surgeon: [name], DPM;  Location: [facility] ORS;  Service: Podiatry;  Laterality: Right;  . SHOULDER ARTHROSCOPY Bilateral   . SHOULDER ARTHROSCOPY Left   . THYROIDECTOMY N/A 08/03/2017   Procedure: THYROIDECTOMY;  Surgeon: Carloyn Manner, MD;  Location: [facility] ORS;  Service: ENT;  Laterality: N/A;  . TONSILLECTOMY    . TUBAL LIGATION      There were no vitals filed for this visit.    Subjective Assessment - 11/30/20 1109  Subjective Dizziness    Pertinent History Pt referred to vestibular therapy by Dr. Pryor Ochoa for dizziness. She reports "my balance is off and feel wobbly." Her dizziness started after a fall in September 2021 where she hit the left side of her head and face. She was not diagnosed with a concussion at the time of her injury. She denies any excessive fatigue, difficulty concentrating, headaches, nausea, blurred vision, emotional lability, or disrupted sleep after her fall. She does complain of bilateral tinnitus which started a couple months  after the fall. She was seen by Dr. Pryor Ochoa on 10/23/20 who referred her for a VNG study. VNG was positive for abnormal smooth pursuits, saccades, and optokinetic testing. She also had 10 degrees of right beating nystagmus in the body left position and six degrees of left beating nystagmus in the body right position. Calorics were symmetrical. 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 History of cataract removal and macular degeneration. Due to central findings on VNG she was referred by [facility] to neurology but has not yet received a call to schedule. She had a R knee replacement in May 2021 and reports that she has osteoarthritis in both feet. History of CHF with fluctuating weight. She had a cardioversion two weeks ago. Possible ablation next month. 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 Pt has bilateral carpal tunnel which causes numbness in her hands. No numbness in the feet. Her primary complaint at this time is difficulty with her balance.    Limitations Walking    Diagnostic tests See history    Patient Stated Goals Improve balance so she can train and show her dogs with more confidence    Currently in Pain? Other (Comment)   Unrelated to current episode             [facility] PT Assessment - 11/30/20 1109      Assessment   Medical Diagnosis Dizziness    Referring Provider (PT) Vaught, Creighton    Onset Date/Surgical Date 04/29/20   Approximate   Hand Dominance Right    Next MD Visit Not reported    Prior Therapy None for this issue      Precautions   Precautions Fall      Restrictions   Weight Bearing Restrictions No      Balance Screen   Has the patient fallen in the past 6 months Yes    How many times? 1    Has the patient had a decrease in activity level because of a fear of falling?  No    Is the patient reluctant to leave their home because of a fear of falling?   No      Home Ecologist residence      Prior Function   Level of Independence Independent      Cognition   Overall Cognitive Status Within Functional Limits for tasks assessed                   VESTIBULAR AND BALANCE EVALUATION   HISTORY:  Subjective history of current problem: Pt referred to vestibular therapy by Dr. Pryor Ochoa for dizziness. She reports "my balance is off and feel wobbly." Her dizziness started after a fall in September 2021 where she hit the left side of her head and face. She was not diagnosed with a concussion at the time of her injury. She denies  any excessive fatigue, difficulty concentrating, headaches, nausea, blurred vision, emotional lability, or disrupted sleep after her fall. She does complain of bilateral tinnitus which started a couple months after the fall. She was seen by Dr. Pryor Ochoa on 10/23/20 who referred her for a VNG study. VNG was positive for abnormal smooth pursuits, saccades, and optokinetic testing. She also had 10 degrees of right beating nystagmus in the body left position and six degrees of left beating nystagmus in the body right position. Calorics were symmetrical. 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 History of cataract removal and macular degeneration. Due to central findings on VNG she was referred by [facility] ENT to neurology but has not yet received a call to schedule. She had a R knee replacement in May 2021 and reports that she has osteoarthritis in both feet. History of CHF with fluctuating weight. She had a cardioversion two weeks ago. Possible ablation next month. 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 Pt has bilateral carpal tunnel which causes numbness in her hands. No numbness in the feet. Her primary complaint at this time is difficulty with her balance.  Description of dizziness: general  unsteadiness (vertigo, unsteadiness, lightheadedness, falling, general unsteadiness, whoozy, swimmy-headed sensation, aural fullness)  Falls (yes/no): Yes Number of falls in past 6 months: 1  Prior Functional Level: Fully independent with ADLs/IADLs  Auditory complaints (tinnitus, pain, drainage, hearing loss, aural fullness): bilateral tinnitus, no fullness, bilateral hearing loss symmetrical, denies pain Vision (diplopia, visual field loss, recent changes, last eye exam): history of macular degeneration but otherwise no changes. Recent eye exam 2 weeks ago, prescription updated  Red Flags: (dysarthria, dysphagia, drop attacks, bowel and bladder changes, recent weight loss/gain) Review of systems negative for red flags.     EXAMINATION  POSTURE: WNL  NEUROLOGICAL SCREEN: (2+ unless otherwise noted.) N=normal  Ab=abnormal  Level Dermatome R L Myotome R L Reflex R L  C3 Anterior Neck N N Sidebend C2-3 N N Jaw CN V    C4 Top of Shoulder N N Shoulder Shrug C4 N N Hoffman's UMN    C5 Lateral Upper Arm N N Shoulder ABD C4-5 N N Biceps C5-6    C6 Lateral Arm/ Thumb N N Arm Flex/ Wrist Ext C5-6 N N Brachiorad. C5-6    C7 Middle Finger N N Arm Ext//Wrist Flex C6-7 N N Triceps C7    C8 4th & 5th Finger N N Flex/ Ext Carpi Ulnaris C8 N N Patellar (L3-4)    T1 Medial Arm N N Interossei T1 N N Gastrocnemius    L2 Medial thigh/groin N N Illiopsoas (L2-3) N N     L3 Lower thigh/med.knee N N Quadriceps (L3-4) N N     L4 Medial leg/lat thigh N N Tibialis Ant (L4-5) N N     L5 Lat. leg & dorsal foot N N EHL (L5) N N     S1 post/lat foot/thigh/leg N N Gastrocnemius (S1-2) N N     S2 Post./med. thigh & leg N N Hamstrings (L4-S3) N N       Cranial Nerves Visual acuity and visual fields are intact  Extraocular muscles are intact  Facial sensation is intact bilaterally  Facial strength is intact bilaterally  Hearing is normal as tested by gross conversation Palate elevates midline, normal  phonation  Shoulder shrug strength is intact  Tongue protrudes midline    SOMATOSENSORY:  Sensation           Intact      Diminished         Absent  Light touch Normal      COORDINATION: Finger to Nose: Normal Heel to Shin: Normal Pronator Drift: Negative Rapid Alternating Movements: Normal Finger to Thumb Opposition: Normal  MUSCULOSKELETAL SCREEN: Cervical Spine ROM: WFL and painless in all planes. No gross deficits identified   ROM: WNL  MMT: Grossly WNL with the exception of 4/5 bilateral ankle dorsiflexion  Functional Mobility: Independent for transfers and ambulation without assistive device    POSTURAL CONTROL TESTS:   Clinical Test of Sensory Interaction for Balance    (CTSIB): Deferred   OCULOMOTOR / VESTIBULAR TESTING:  Oculomotor Exam- Room Light  Findings Comments  Ocular Alignment normal   Ocular ROM normal   Spontaneous Nystagmus normal   Gaze-Holding Nystagmus normal   End-Gaze Nystagmus normal   Vergence (normal 2-3") not examined   Smooth Pursuit abnormal Mildly saccadic  Cross-Cover Test not examined   Saccades abnormal Normal horizontaly, slightly hypometric vertically  VOR Cancellation normal   Left Head Impulse not examined   Right Head Impulse not examined   Static Acuity not examined   Dynamic Acuity not examined     Oculomotor Exam- Fixation Suppressed: Deferred   BPPV TESTS:  Symptoms Duration Intensity Nystagmus  L Dix-Hallpike None   A few L torsional beats  R Dix-Hallpike None   None  L Head Roll None   A few L torsional beats  R Head Roll None   A few apogeotrophic beats  L Sidelying Test      R Sidelying Test       ABC: 67.5% FOTO:   FUNCTIONAL OUTCOME MEASURES: Deferred              Objective measurements completed on examination: See above findings.               PT Education - 11/30/20 2258    Education Details Plan of care    Person(s) Educated Patient    Methods Explanation     Comprehension Verbalized understanding            PT Short Term Goals - 12/01/20 1411      PT SHORT TERM GOAL #1   Title Pt will be independent with HEP in order to improve strength and balance in order to decrease fall risk and improve function at home and when training her dogs    Time 6    Period Weeks    Status New    Target Date 01/11/21             PT Long Term Goals - 12/01/20 1412      PT LONG TERM GOAL #1   Title Pt will improve BERG by at least 3 points in order to demonstrate clinically significant improvement in balance.    Baseline 11/30/20: To be completed at next appointment    Time 12    Period Weeks    Status New    Target Date 02/22/21      PT LONG TERM GOAL #2   Title Pt will improve ABC by at least 13% in order to demonstrate clinically significant improvement in balance confidence.    Baseline 11/30/20: 67.5%    Time 12    Period Weeks    Status New    Target Date 02/22/21      PT LONG TERM GOAL #3  Title Pt will improve FOTO to at least 55 in order to demonstrate clinically significant improvement in function related to balance and decrease her risk for falls.    Baseline 11/30/20: 46    Time 12    Period Weeks    Status New    Target Date 02/22/21                  Plan - 11/30/20 2301    Clinical Impression Statement Pt is a pleasant 70 year-old female referred for dizziness however when she arrives she reports that she feels like her primary complaint is unsteadiness and not necessarily dizziness. VNG was negative for any caloric weakness and showed mostly central findings. She is waiting to hear from neurology to schedule. PT evaluation was negative for peripheral findings. All BPPV testing negative. No focal neurologic deficits identified.  Due to time constraints unable to perform additional balance testing today however that will be performed at next follow-up session. Will also initiate HEP at that time. Pt will benefit from skilled PT  services to address deficits in balance, decrease risk for future falls, and improve function at home.    Personal Factors and Comorbidities Age;Comorbidity 3+    Comorbidities CHF, A-fib, depression, fibromyalgia, OA    Examination-Activity Limitations Locomotion Level;Stairs;Stand;Transfers    Examination-Participation Restrictions Community Activity;Other   Showing dogs   Stability/Clinical Decision Making Unstable/Unpredictable    Clinical Decision Making High    Rehab Potential Good    PT Frequency 2x / week    PT Duration 8 weeks    PT Treatment/Interventions ADLs/Self Care Home Management;Aquatic Therapy;Biofeedback;Canalith Repostioning;Cryotherapy;Electrical Stimulation;Moist Heat;Iontophoresis 4mg /ml Dexamethasone;Traction;Ultrasound;Contrast Bath;DME Instruction;Gait training;Stair training;Functional mobility training;Therapeutic activities;Therapeutic exercise;Balance training;Neuromuscular re-education;Patient/family education;Manual techniques;Passive range of motion;Dry needling;Vestibular;Spinal Manipulations;Joint Manipulations    PT Next Visit Plan BERG, FGA/DGI, additinal outcome measures, initiate HEP    PT Home Exercise Plan None currently    Consulted and Agree with Plan of Care Patient           Patient will benefit from skilled therapeutic intervention in order to improve the following deficits and impairments:  Decreased balance,Difficulty walking  Visit Diagnosis: Unsteadiness on feet     Problem List Patient Active Problem List   Diagnosis Date Noted  . Acute diastolic heart failure ([place]) 02/10/2020  . Acute respiratory failure with hypoxia ([place]) 02/09/2020  . Hydronephrosis with renal and ureteral calculus obstruction 02/09/2020  . Acute pyelonephritis 02/09/2020  . Acute diastolic CHF (congestive heart failure) ([place]) 02/09/2020  . 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 AF (paroxysmal atrial fibrillation) ([place]) s/p ablation 02/09/2020  . Post-op  pain 04/08/2018  . Post-operative pain 04/06/2018  . Atrial fibrillation with RVR ([place]) 12/14/2017  . Hypothyroidism 12/14/2017  . Valvular heart disease 12/14/2017  . Muscle cramps 12/14/2017  . S/P complete thyroidectomy 08/03/2017   Phillips Grout PT, DPT, GCS  Mandy Jones 12/01/2020, 2:20 PM  [facility] [address] [place], [place], [address] Phone: [phone]   Fax:  [phone]  Name: Mandy Jones [facility] MRN: [id number] Date of Birth: 01-10-1951

## 2020-12-03 ENCOUNTER — Other Ambulatory Visit: Payer: Self-pay

## 2020-12-03 ENCOUNTER — Ambulatory Visit: Payer: Medicare HMO

## 2020-12-03 DIAGNOSIS — R2681 Unsteadiness on feet: Secondary | ICD-10-CM | POA: Diagnosis not present

## 2020-12-03 NOTE — Therapy (Signed)
[facility] [address] [place], [place], [address] Phone: [phone]   Fax:  [phone]  Physical Therapy Treatment  Patient Details  Name: Mandy Jones MRN: [id number] Date of Birth: 05/27/1951 Referring Provider (PT): Carloyn Manner   Encounter Date: 12/03/2020   PT End of Session - 12/03/20 1543    Visit Number 2    Number of Visits 17    Date for PT Re-Evaluation 01/25/21    Authorization Type eval: 11/30/20    PT Start Time 1540    PT Stop Time 1615    PT Time Calculation (min) 35 min    Activity Tolerance Patient tolerated treatment well    Behavior During Therapy [facility] for tasks assessed/performed           Past Medical History:  Diagnosis Date  . Arrhythmia    atrial fibrillation  . Arthritis   . CHF (congestive heart failure) ([place])   . Depression   . Dyspnea   . Dysrhythmia   . Fibromyalgia 1992  . GERD (gastroesophageal reflux disease)   . Hepatitis    Hepatitis B 1985  . Hyperlipidemia   . Hypertension   . Mitral valve prolapse   . Sleep apnea    CPAP  . Tendon tear, ankle    rt foot  . Thyroid dysfunction     Past Surgical History:  Procedure Laterality Date  . ACHILLES TENDON SURGERY Right 04/06/2018   Procedure: ACHILLES TENDON REPAIR;  Surgeon: [name], DPM;  Location: [facility];  Service: Podiatry;  Laterality: Right;  . CARDIOVERSION N/A 02/06/2018   Procedure: CARDIOVERSION;  Surgeon: Corey Skains, MD;  Location: [facility];  Service: Cardiovascular;  Laterality: N/A;  . CARDIOVERSION N/A 03/28/2018   Procedure: CARDIOVERSION;  Surgeon: Corey Skains, MD;  Location: [facility];  Service: Cardiovascular;  Laterality: N/A;  . CARDIOVERSION N/A 05/09/2018   Procedure: CARDIOVERSION;  Surgeon: Corey Skains, MD;  Location: [facility];  Service: Cardiovascular;  Laterality: N/A;  . CARDIOVERSION N/A 10/23/2019   Procedure: CARDIOVERSION;  Surgeon: Corey Skains, MD;   Location: [facility];  Service: Cardiovascular;  Laterality: N/A;  . CARDIOVERSION N/A 09/30/2020   Procedure: CARDIOVERSION;  Surgeon: Corey Skains, MD;  Location: [facility];  Service: Cardiovascular;  Laterality: N/A;  . CARDIOVERSION N/A 11/19/2020   Procedure: CARDIOVERSION;  Surgeon: Corey Skains, MD;  Location: [facility];  Service: Cardiovascular;  Laterality: N/A;  . CHOLECYSTECTOMY    . CONTINUOUS NERVE MONITORING N/A 08/03/2017   Procedure: LARYNGEAL NERVE MONITORING;  Surgeon: Carloyn Manner, MD;  Location: [facility];  Service: ENT;  Laterality: N/A;  . EXTRACORPOREAL SHOCK WAVE LITHOTRIPSY Right 02/13/2020   Procedure: EXTRACORPOREAL SHOCK WAVE LITHOTRIPSY (ESWL);  Surgeon: Billey Co, MD;  Location: [facility];  Service: Urology;  Laterality: Right;  . EYE SURGERY Bilateral   . FOOT SURGERY Left 2000  . JOINT REPLACEMENT     left knee  . OSTECTOMY Right 04/06/2018   Procedure: OSTECTOMY-HAGLUNDS/RECTROCALCANEAL;  Surgeon: [name], DPM;  Location: [facility];  Service: Podiatry;  Laterality: Right;  . SHOULDER ARTHROSCOPY Bilateral   . SHOULDER ARTHROSCOPY Left   . THYROIDECTOMY N/A 08/03/2017   Procedure: THYROIDECTOMY;  Surgeon: Carloyn Manner, MD;  Location: [facility];  Service: ENT;  Laterality: N/A;  . TONSILLECTOMY    . TUBAL LIGATION      There were no vitals filed for this visit.   Subjective Assessment - 12/03/20 1541  Subjective Pt reports that she is doing well today. She denies any changes in health/medications since her initial evaluation. No stumbles or falls. Denies pain upon arrival. No specific questions or concerns currently.    Pertinent History Pt referred to vestibular therapy by Dr. Pryor Ochoa for dizziness. She reports "my balance is off and feel wobbly." Her dizziness started after a fall in September 2021 where she hit the left side of her head and face. She was not diagnosed with a concussion at the time of her injury. She denies any excessive  fatigue, difficulty concentrating, headaches, nausea, blurred vision, emotional lability, or disrupted sleep after her fall. She does complain of bilateral tinnitus which started a couple months after the fall. She was seen by Dr. Pryor Ochoa on 10/23/20 who referred her for a VNG study. VNG was positive for abnormal smooth pursuits, saccades, and optokinetic testing. She also had 10 degrees of right beating nystagmus in the body left position and six degrees of left beating nystagmus in the body right position. Calorics were symmetrical. 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 History of cataract removal and macular degeneration. Due to central findings on VNG she was referred by [facility] to neurology but has not yet received a call to schedule. She had a R knee replacement in May 2021 and reports that she has osteoarthritis in both feet. History of CHF with fluctuating weight. She had a cardioversion two weeks ago. Possible ablation next month. 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 Pt has bilateral carpal tunnel which causes numbness in her hands. No numbness in the feet. Her primary complaint at this time is difficulty with her balance.    Limitations Walking    Diagnostic tests See history    Patient Stated Goals Improve balance so she can train and show her dogs with more confidence    Currently in Pain? No/denies              TREATMENT   Neuromuscular Re-education    FUNCTIONAL OUTCOME MEASURES   11/30/20 12/03/20 Comments  BERG NT 49/56 Fall risk, in need of intervention  FGA NT 18/30 Fall risk, in need of intervention  TUG NT 11.2s WNL  5TSTS NT Unable to perform sit to stand without heavy UE reliance In need of intervention  FOTO 46 NT Predicted improvement to 55  10 Meter Gait Speed NT Self-selected: 11.2s = 0.89 m/s; Fastest: 8.7s = 1.15 m/s Self-selected speed below  normative values for full community ambulation  ABC Scale 67.5% NT In need of intervention    POSTURAL CONTROL TESTS:   Clinical Test of Sensory Interaction for Balance    (CTSIB):  CONDITION TIME STRATEGY SWAY  Eyes open, firm surface 30 seconds ankle 1+  Eyes closed, firm surface 30 seconds ankle 2+  Eyes open, foam surface 30 seconds ankle 2+  Eyes closed, foam surface 4.7 seconds ankle 4+     HEP issued with extensive explanation to patient about how to perform correctly/safely;    Pt arrived late so session was somewhat abbreviated today. Performed additional outcome measures with patient. She demonstrates balance impairments as indicated by 49/56 on the BERG and 18/30 on the FGA. Her self-selected 31m gait speed was 0.89 m/s which is below required value for full community ambulation. She is only able to maintain balance for 4.7s on foam surface with eyes closed (condition 4) of the modified  CTSIB. Initiated strength and balance exercises for HEP and issued to patient today. Pt encouraged to follow-up as scheduled. She will benefit from PT services to address deficits in strength, balance, and mobility in order to return to full function at home.                     PT Short Term Goals - 12/01/20 1411      PT SHORT TERM GOAL #1   Title Pt will be independent with HEP in order to improve strength and balance in order to decrease fall risk and improve function at home and when training her dogs    Time 6    Period Weeks    Status New    Target Date 01/11/21             PT Long Term Goals - 12/04/20 1027      PT LONG TERM GOAL #1   Title Pt will improve BERG by at least 3 points in order to demonstrate clinically significant improvement in balance.    Baseline 11/30/20: To be completed at next appointment; 12/04/20: 49/56    Time 12    Period Weeks    Status New    Target Date 02/22/21      PT LONG TERM GOAL #2   Title Pt will improve ABC by at least 13%  in order to demonstrate clinically significant improvement in balance confidence.    Baseline 11/30/20: 67.5%    Time 12    Period Weeks    Status New    Target Date 02/22/21      PT LONG TERM GOAL #3   Title Pt will improve FOTO to at least 55 in order to demonstrate clinically significant improvement in function related to balance and decrease her risk for falls.    Baseline 11/30/20: 46    Time 12    Period Weeks    Status New    Target Date 02/22/21      PT LONG TERM GOAL #4   Title Pt will demonstrate at least 4 point improvement in the FGA in order to demonstrate significant improvement in balance to decrease her fall risk at home and improve safety at home    Baseline 12/03/20:18/30    Time 12    Period Weeks    Status New    Target Date 02/22/21      PT LONG TERM GOAL #5   Title Pt will increase self-selected 10MWT by at least 0.13 m/s in order to demonstrate clinically significant improvement in community ambulation.    Baseline 12/03/20: 11.2s = 0.62m/s    Time 12    Period Weeks    Status New    Target Date 02/22/21                 Plan - 12/03/20 1544    Clinical Impression Statement Pt arrived late so session was somewhat abbreviated today. Performed additional outcome measures with patient. She demonstrates balance impairments as indicated by 49/56 on the BERG and 18/30 on the FGA. Her self-selected 57m gait speed was 0.89 m/s which is below required value for full community ambulation. She is only able to maintain balance for 4.7s on foam surface with eyes closed (condition 4) of the modified CTSIB. Initiated strength and balance exercises for HEP and issued to patient today. Pt encouraged to follow-up as scheduled. She will benefit from PT services to address deficits in strength, balance, and mobility  in order to return to full function at home.    Personal Factors and Comorbidities Age;Comorbidity 3+    Comorbidities CHF, A-fib, depression, fibromyalgia, OA     Examination-Activity Limitations Locomotion Level;Stairs;Stand;Transfers    Examination-Participation Restrictions Community Activity;Other   Showing dogs   Stability/Clinical Decision Making Unstable/Unpredictable    Rehab Potential Good    PT Frequency 2x / week    PT Duration 8 weeks    PT Treatment/Interventions ADLs/Self Care Home Management;Aquatic Therapy;Biofeedback;Canalith Repostioning;Cryotherapy;Electrical Stimulation;Moist Heat;Iontophoresis 4mg /ml Dexamethasone;Traction;Ultrasound;Contrast Bath;DME Instruction;Gait training;Stair training;Functional mobility training;Therapeutic activities;Therapeutic exercise;Balance training;Neuromuscular re-education;Patient/family education;Manual techniques;Passive range of motion;Dry needling;Vestibular;Spinal Manipulations;Joint Manipulations    PT Next Visit Plan Progress balance and strength    PT Home Exercise Plan Access Code: [email or web address]    Consulted and Agree with Plan of Care Patient           Patient will benefit from skilled therapeutic intervention in order to improve the following deficits and impairments:  Decreased balance,Difficulty walking  Visit Diagnosis: Unsteadiness on feet     Problem List Patient Active Problem List   Diagnosis Date Noted  . Acute diastolic heart failure ([place]) 02/10/2020  . Acute respiratory failure with hypoxia ([place]) 02/09/2020  . Hydronephrosis with renal and ureteral calculus obstruction 02/09/2020  . Acute pyelonephritis 02/09/2020  . Acute diastolic CHF (congestive heart failure) ([place]) 02/09/2020  . 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 AF (paroxysmal atrial fibrillation) ([place]) s/p ablation 02/09/2020  . Post-op pain 04/08/2018  . Post-operative pain 04/06/2018  . Atrial fibrillation with RVR ([place]) 12/14/2017  . Hypothyroidism 12/14/2017  . Valvular heart disease 12/14/2017  . Muscle cramps 12/14/2017  . S/P complete thyroidectomy 08/03/2017   Phillips Grout PT, DPT, GCS   Mandy Jones 12/04/2020, 10:32 AM  [facility] [address] [place], [place], [address] Phone: [phone]   Fax:  [phone]  Name: Mandy Jones MRN: [id number] Date of Birth: 09-26-1950

## 2020-12-03 NOTE — Patient Instructions (Addendum)
Access Code: [email or web address] URL: [email or web address] Date: 12/03/2020 Prepared by: Roxana Hires  Exercises Sit to Stand with Hands on Knees - 1 x daily - 7 x weekly - 2 sets - 10 reps Standing Tandem Balance with Counter Support - 1 x daily - 7 x weekly - 30s x 3 on each side hold

## 2020-12-07 ENCOUNTER — Ambulatory Visit: Payer: Medicare HMO | Admitting: Physical Therapy

## 2020-12-08 NOTE — Patient Instructions (Incomplete)
TREATMENT   Neuromuscular Re-education  Primary concern is balance related deficits, review HEP and progress balance and strengthening activities   Pt arrived late so session was somewhat abbreviated today. Performed additional outcome measures with patient. She demonstrates balance impairments as indicated by 49/56 on the BERG and 18/30 on the FGA. Her self-selected 10m gait speed was 0.89 m/s which is below required value for full community ambulation. She is only able to maintain balance for 4.7s on foam surface with eyes closed (condition 4) of the modified CTSIB. Initiated strength and balance exercises for HEP and issued to patient today. Pt encouraged to follow-up as scheduled. She will benefit from PT services to address deficits in strength, balance, and mobility in order to return to full function at home.

## 2020-12-10 ENCOUNTER — Ambulatory Visit: Payer: Medicare HMO

## 2020-12-10 ENCOUNTER — Other Ambulatory Visit: Payer: Self-pay

## 2020-12-10 VITALS — BP 149/70 | HR 46

## 2020-12-10 DIAGNOSIS — R2681 Unsteadiness on feet: Secondary | ICD-10-CM | POA: Diagnosis not present

## 2020-12-10 NOTE — Therapy (Signed)
[facility] [address] [place], [place], [address] Phone: [phone]   Fax:  [phone]  Physical Therapy Treatment  Patient Details  Name: Mandy Jones [facility] MRN: [id number] Date of Birth: [birth year] Referring Provider (PT): Carloyn Manner   Encounter Date: 12/10/2020   PT End of Session - 12/10/20 1207    Visit Number 3    Number of Visits 17    Date for PT Re-Evaluation 01/25/21    Authorization Type eval: 11/30/20    PT Start Time 1205    PT Stop Time 7253    PT Time Calculation (min) 30 min    Activity Tolerance Patient tolerated treatment well    Behavior During Therapy [facility] for tasks assessed/performed           Past Medical History:  Diagnosis Date  . Arrhythmia    atrial fibrillation  . Arthritis   . CHF (congestive heart failure) ([place])   . Depression   . Dyspnea   . Dysrhythmia   . Fibromyalgia 1992  . GERD (gastroesophageal reflux disease)   . Hepatitis    Hepatitis B 1985  . Hyperlipidemia   . Hypertension   . Mitral valve prolapse   . Sleep apnea    CPAP  . Tendon tear, ankle    rt foot  . Thyroid dysfunction     Past Surgical History:  Procedure Laterality Date  . ACHILLES TENDON SURGERY Right 04/06/2018   Procedure: ACHILLES TENDON REPAIR;  Surgeon: [name], DPM;  Location: [facility]S;  Service: Podiatry;  Laterality: Right;  . CARDIOVERSION N/A 02/06/2018   Procedure: CARDIOVERSION;  Surgeon: Corey Skains, MD;  Location: [facility];  Service: Cardiovascular;  Laterality: N/A;  . CARDIOVERSION N/A 03/28/2018   Procedure: CARDIOVERSION;  Surgeon: Corey Skains, MD;  Location: [facility];  Service: Cardiovascular;  Laterality: N/A;  . CARDIOVERSION N/A 05/09/2018   Procedure: CARDIOVERSION;  Surgeon: Corey Skains, MD;  Location: [facility];  Service: Cardiovascular;  Laterality: N/A;  . CARDIOVERSION N/A 10/23/2019   Procedure: CARDIOVERSION;  Surgeon: Corey Skains, MD;   Location: [facility];  Service: Cardiovascular;  Laterality: N/A;  . CARDIOVERSION N/A 09/30/2020   Procedure: CARDIOVERSION;  Surgeon: Corey Skains, MD;  Location: [facility];  Service: Cardiovascular;  Laterality: N/A;  . CARDIOVERSION N/A 11/19/2020   Procedure: CARDIOVERSION;  Surgeon: Corey Skains, MD;  Location: [facility];  Service: Cardiovascular;  Laterality: N/A;  . CHOLECYSTECTOMY    . CONTINUOUS NERVE MONITORING N/A 08/03/2017   Procedure: LARYNGEAL NERVE MONITORING;  Surgeon: Carloyn Manner, MD;  Location: [facility];  Service: ENT;  Laterality: N/A;  . EXTRACORPOREAL SHOCK WAVE LITHOTRIPSY Right 02/13/2020   Procedure: EXTRACORPOREAL SHOCK WAVE LITHOTRIPSY (ESWL);  Surgeon: Billey Co, MD;  Location: [facility];  Service: Urology;  Laterality: Right;  . EYE SURGERY Bilateral   . FOOT SURGERY Left 2000  . JOINT REPLACEMENT     left knee  . OSTECTOMY Right 04/06/2018   Procedure: OSTECTOMY-HAGLUNDS/RECTROCALCANEAL;  Surgeon: [name], DPM;  Location: [facility];  Service: Podiatry;  Laterality: Right;  . SHOULDER ARTHROSCOPY Bilateral   . SHOULDER ARTHROSCOPY Left   . THYROIDECTOMY N/A 08/03/2017   Procedure: THYROIDECTOMY;  Surgeon: Carloyn Manner, MD;  Location: [facility];  Service: ENT;  Laterality: N/A;  . TONSILLECTOMY    . TUBAL LIGATION      Vitals:   12/10/20 1207  BP: (!) 149/70  Pulse: (!) 46  SpO2: 97%  Subjective Assessment - 12/10/20 1207    Subjective Pt reports that she has had mutliple days of significant fatigue. She has been sleeping a lot. She also had some bilateral low back pain which was radiating into her buttocks but that has resolved. She reports L dorsal foot pain upon arrival which started this morning and is new for patient. She is unsure if the fatigue is related to her fibromyaglia or maybe her CHF. No significant change in her balance since last therapy session. No stumbles or falls. No specific questions or concerns currently.     Pertinent History Pt referred to vestibular therapy by Dr. Pryor Ochoa for dizziness. She reports "my balance is off and feel wobbly." Her dizziness started after a fall in September 2021 where she hit the left side of her head and face. She was not diagnosed with a concussion at the time of her injury. She denies any excessive fatigue, difficulty concentrating, headaches, nausea, blurred vision, emotional lability, or disrupted sleep after her fall. She does complain of bilateral tinnitus which started a couple months after the fall. She was seen by Dr. Pryor Ochoa on 10/23/20 who referred her for a VNG study. VNG was positive for abnormal smooth pursuits, saccades, and optokinetic testing. She also had 10 degrees of right beating nystagmus in the body left position and six degrees of left beating nystagmus in the body right position. Calorics were symmetrical. 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 History of cataract removal and macular degeneration. Due to central findings on VNG she was referred by [facility] to neurology but has not yet received a call to schedule. She had a R knee replacement in May 2021 and reports that she has osteoarthritis in both feet. History of CHF with fluctuating weight. She had a cardioversion two weeks ago. Possible ablation next month. 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 Pt has bilateral carpal tunnel which causes numbness in her hands. No numbness in the feet. Her primary complaint at this time is difficulty with her balance.    Limitations Walking    Diagnostic tests See history    Patient Stated Goals Improve balance so she can train and show her dogs with more confidence    Currently in Pain? Yes    Pain Score 5     Pain Location Foot    Pain Orientation Left    Pain Descriptors / Indicators Aching    Pain Type Acute pain    Pain Onset Today    Pain Frequency  Intermittent            TREATMENT   Ther-ex  NuStep L0-2 x 5 minutes for warm-up during history; Sit to stand from regular height chair with Airex pad on seat, no UE assist, x 7 but stopped due to R lateral knee pain; Seated LAQ 3# ankle weights (AW) x 1 minute;  Standing exercises with 3# AW: Hip flexion marching x 1 minute; Hip abduction x 1 minute; HS curls x 1 minute; Hip extension x 1 minute;    Neuromuscular Re-education  All balance exercises performed without UE support unless otherwise noted; Tandem balance alternating forward LE x 30s each; Heel/toe raises x 10 each direction without UE support; Tandem gait in // bars without UE support 10' x 2 lengths;   Pt educated throughout session about proper posture and technique with exercises. Improved exercise technique, movement at target joints,  use of target muscles after min to mod verbal, visual, tactile cues.    Pt arrived late so session was significantly abbreviated today. Focused mostly on strengthening but some balance exercises included in session. Pt demonstrates significant fatigue requiring regular seated rest breaks during session. Difficulty with balance in tandem stance.  Pt encouraged to follow-up as scheduled. She will benefit from PT services to address deficits in strength, balance, and mobility in order to return to full function at home.                            PT Short Term Goals - 12/01/20 1411      PT SHORT TERM GOAL #1   Title Pt will be independent with HEP in order to improve strength and balance in order to decrease fall risk and improve function at home and when training her dogs    Time 6    Period Weeks    Status New    Target Date 01/11/21             PT Long Term Goals - 12/04/20 1027      PT LONG TERM GOAL #1   Title Pt will improve BERG by at least 3 points in order to demonstrate clinically significant improvement in balance.    Baseline 11/30/20:  To be completed at next appointment; 12/04/20: 49/56    Time 12    Period Weeks    Status New    Target Date 02/22/21      PT LONG TERM GOAL #2   Title Pt will improve ABC by at least 13% in order to demonstrate clinically significant improvement in balance confidence.    Baseline 11/30/20: 67.5%    Time 12    Period Weeks    Status New    Target Date 02/22/21      PT LONG TERM GOAL #3   Title Pt will improve FOTO to at least 55 in order to demonstrate clinically significant improvement in function related to balance and decrease her risk for falls.    Baseline 11/30/20: 46    Time 12    Period Weeks    Status New    Target Date 02/22/21      PT LONG TERM GOAL #4   Title Pt will demonstrate at least 4 point improvement in the FGA in order to demonstrate significant improvement in balance to decrease her fall risk at home and improve safety at home    Baseline 12/03/20:18/30    Time 12    Period Weeks    Status New    Target Date 02/22/21      PT LONG TERM GOAL #5   Title Pt will increase self-selected 10MWT by at least 0.13 m/s in order to demonstrate clinically significant improvement in community ambulation.    Baseline 12/03/20: 11.2s = 0.63m/s    Time 12    Period Weeks    Status New    Target Date 02/22/21                 Plan - 12/10/20 1213    Clinical Impression Statement Pt arrived late so session was significantly abbreviated today. Focused mostly on strengthening but some balance exercises included in session. Pt demonstrates significant fatigue requiring regular seated rest breaks during session. Difficulty with balance in tandem stance.  Pt encouraged to follow-up as scheduled. She will benefit from PT services to address deficits in  strength, balance, and mobility in order to return to full function at home.    Personal Factors and Comorbidities Age;Comorbidity 3+    Comorbidities CHF, A-fib, depression, fibromyalgia, OA    Examination-Activity Limitations  Locomotion Level;Stairs;Stand;Transfers    Examination-Participation Restrictions Community Activity;Other   Showing dogs   Stability/Clinical Decision Making Unstable/Unpredictable    Rehab Potential Good    PT Frequency 2x / week    PT Duration 8 weeks    PT Treatment/Interventions ADLs/Self Care Home Management;Aquatic Therapy;Biofeedback;Canalith Repostioning;Cryotherapy;Electrical Stimulation;Moist Heat;Iontophoresis 4mg /ml Dexamethasone;Traction;Ultrasound;Contrast Bath;DME Instruction;Gait training;Stair training;Functional mobility training;Therapeutic activities;Therapeutic exercise;Balance training;Neuromuscular re-education;Patient/family education;Manual techniques;Passive range of motion;Dry needling;Vestibular;Spinal Manipulations;Joint Manipulations    PT Next Visit Plan Progress balance and strength    PT Home Exercise Plan Access Code: [email or web address]    Consulted and Agree with Plan of Care Patient           Patient will benefit from skilled therapeutic intervention in order to improve the following deficits and impairments:  Decreased balance,Difficulty walking  Visit Diagnosis: Unsteadiness on feet     Problem List Patient Active Problem List   Diagnosis Date Noted  . Acute diastolic heart failure ([place]) 02/10/2020  . Acute respiratory failure with hypoxia ([place]) 02/09/2020  . Hydronephrosis with renal and ureteral calculus obstruction 02/09/2020  . Acute pyelonephritis 02/09/2020  . Acute diastolic CHF (congestive heart failure) ([place]) 02/09/2020  . 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 AF (paroxysmal atrial fibrillation) ([place]) s/p ablation 02/09/2020  . Post-op pain 04/08/2018  . Post-operative pain 04/06/2018  . Atrial fibrillation with RVR ([place]) 12/14/2017  . Hypothyroidism 12/14/2017  . Valvular heart disease 12/14/2017  . Muscle cramps 12/14/2017  . S/P complete thyroidectomy 08/03/2017   Lyndel Safe Mandy Jones PT, DPT, GCS  Mandy Jones 12/10/2020, 2:02  PM   [facility] [address] [place], [place], [address] Phone: [phone]   Fax:  [phone]  Name: Mandy Jones [facility] MRN: [id number] Date of Birth: 19-Mar-1951

## 2020-12-14 ENCOUNTER — Other Ambulatory Visit: Payer: Self-pay

## 2020-12-14 ENCOUNTER — Ambulatory Visit: Payer: Medicare HMO

## 2020-12-14 DIAGNOSIS — R2681 Unsteadiness on feet: Secondary | ICD-10-CM

## 2020-12-14 NOTE — Therapy (Signed)
[facility] [address] [place], [place], [address] Phone: [phone]   Fax:  [phone]  Physical Therapy Treatment  Patient Details  Name: Mandy Jones [facility] MRN: [id number] Date of Birth: 07-Feb-1951 Referring Provider (PT): Carloyn Manner   Encounter Date: 12/14/2020   PT End of Session - 12/14/20 1029    Visit Number 4    Number of Visits 17    Date for PT Re-Evaluation 01/25/21    Authorization Type eval: 11/30/20    PT Start Time 1025    PT Stop Time 1100    PT Time Calculation (min) 35 min    Activity Tolerance Patient tolerated treatment well    Behavior During Therapy [facility] for tasks assessed/performed           Past Medical History:  Diagnosis Date  . Arrhythmia    atrial fibrillation  . Arthritis   . CHF (congestive heart failure) ([place])   . Depression   . Dyspnea   . Dysrhythmia   . Fibromyalgia 1992  . GERD (gastroesophageal reflux disease)   . Hepatitis    Hepatitis B 1985  . Hyperlipidemia   . Hypertension   . Mitral valve prolapse   . Sleep apnea    CPAP  . Tendon tear, ankle    rt foot  . Thyroid dysfunction     Past Surgical History:  Procedure Laterality Date  . ACHILLES TENDON SURGERY Right 04/06/2018   Procedure: ACHILLES TENDON REPAIR;  Surgeon: [name], DPM;  Location: [facility];  Service: Podiatry;  Laterality: Right;  . CARDIOVERSION N/A 02/06/2018   Procedure: CARDIOVERSION;  Surgeon: Corey Skains, MD;  Location: [facility];  Service: Cardiovascular;  Laterality: N/A;  . CARDIOVERSION N/A 03/28/2018   Procedure: CARDIOVERSION;  Surgeon: Corey Skains, MD;  Location: [facility];  Service: Cardiovascular;  Laterality: N/A;  . CARDIOVERSION N/A 05/09/2018   Procedure: CARDIOVERSION;  Surgeon: Corey Skains, MD;  Location: [facility];  Service: Cardiovascular;  Laterality: N/A;  . CARDIOVERSION N/A 10/23/2019   Procedure: CARDIOVERSION;  Surgeon: Corey Skains, MD;   Location: [facility];  Service: Cardiovascular;  Laterality: N/A;  . CARDIOVERSION N/A 09/30/2020   Procedure: CARDIOVERSION;  Surgeon: Corey Skains, MD;  Location: [facility];  Service: Cardiovascular;  Laterality: N/A;  . CARDIOVERSION N/A 11/19/2020   Procedure: CARDIOVERSION;  Surgeon: Corey Skains, MD;  Location: [facility];  Service: Cardiovascular;  Laterality: N/A;  . CHOLECYSTECTOMY    . CONTINUOUS NERVE MONITORING N/A 08/03/2017   Procedure: LARYNGEAL NERVE MONITORING;  Surgeon: Carloyn Manner, MD;  Location: [facility];  Service: ENT;  Laterality: N/A;  . EXTRACORPOREAL SHOCK WAVE LITHOTRIPSY Right 02/13/2020   Procedure: EXTRACORPOREAL SHOCK WAVE LITHOTRIPSY (ESWL);  Surgeon: Billey Co, MD;  Location: [facility];  Service: Urology;  Laterality: Right;  . EYE SURGERY Bilateral   . FOOT SURGERY Left 2000  . JOINT REPLACEMENT     left knee  . OSTECTOMY Right 04/06/2018   Procedure: OSTECTOMY-HAGLUNDS/RECTROCALCANEAL;  Surgeon: [name], DPM;  Location: [facility];  Service: Podiatry;  Laterality: Right;  . SHOULDER ARTHROSCOPY Bilateral   . SHOULDER ARTHROSCOPY Left   . THYROIDECTOMY N/A 08/03/2017   Procedure: THYROIDECTOMY;  Surgeon: Carloyn Manner, MD;  Location: [facility];  Service: ENT;  Laterality: N/A;  . TONSILLECTOMY    . TUBAL LIGATION      There were no vitals filed for this visit.   Subjective Assessment - 12/14/20 1024  Subjective Pt reports that her fatigue is improving slightly however she woke up the day after her last therapy session with bilateral knee and shoulder pain. No significant change in her balance since last therapy session. No stumbles or falls. No specific questions currently.    Pertinent History Pt referred to vestibular therapy by Dr. Pryor Ochoa for dizziness. She reports "my balance is off and feel wobbly." Her dizziness started after a fall in September 2021 where she hit the left side of her head and face. She was not diagnosed with a  concussion at the time of her injury. She denies any excessive fatigue, difficulty concentrating, headaches, nausea, blurred vision, emotional lability, or disrupted sleep after her fall. She does complain of bilateral tinnitus which started a couple months after the fall. She was seen by Dr. Pryor Ochoa on 10/23/20 who referred her for a VNG study. VNG was positive for abnormal smooth pursuits, saccades, and optokinetic testing. She also had 10 degrees of right beating nystagmus in the body left position and six degrees of left beating nystagmus in the body right position. Calorics were symmetrical. 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 History of cataract removal and macular degeneration. Due to central findings on VNG she was referred by [facility] to neurology but has not yet received a call to schedule. She had a R knee replacement in May 2021 and reports that she has osteoarthritis in both feet. History of CHF with fluctuating weight. She had a cardioversion two weeks ago. Possible ablation next month. 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 Pt has bilateral carpal tunnel which causes numbness in her hands. No numbness in the feet. Her primary complaint at this time is difficulty with her balance.    Limitations Walking    Diagnostic tests See history    Patient Stated Goals Improve balance so she can train and show her dogs with more confidence    Currently in Pain? Yes    Pain Score 3     Pain Location Foot    Pain Orientation Left    Pain Descriptors / Indicators Aching    Pain Type Acute pain    Pain Onset In the past 7 days            TREATMENT   Ther-ex  NuStep L0-2 x 5 minutes for warm-up during history;  Seated marches with green tband 2 x 1 minute; Seated clams with green tband 2 x 1 minute; Seated adductor ball squeeze 2 x 1 minute;  Seated LAQ with 3# ankle  weights 2 x 1 minute;   Neuromuscular Re-education  All balance exercises performed without UE support unless otherwise noted; Tandem balance alternating forward LE 2 x 30s each; Tandem gait in // bars without UE support 10' x 2 lengths; Airex feet together eyes open/closed x 30s each; Airex feet together horizontal and vertical head turns x 30s each;   Pt educated throughout session about proper posture and technique with exercises. Improved exercise technique, movement at target joints, use of target muscles after min to mod verbal, visual, tactile cues.    Pt arrived late so session was abbreviated again today. Focused on strengthening and balance today but worked on more seated exercises due to reports of some knee pain after last session. Pt demonstrates significant fatigue as well as DOE requiring regular seated rest breaks during session. SpO2 remains WNL throughout session. Pt  does struggle with head turns during balance on Airex. Pt encouraged to follow-up as scheduled. Shewill benefit from PT services to address deficits in strength, balance, and mobility in order to return to full function at home.                               PT Short Term Goals - 12/01/20 1411      PT SHORT TERM GOAL #1   Title Pt will be independent with HEP in order to improve strength and balance in order to decrease fall risk and improve function at home and when training her dogs    Time 6    Period Weeks    Status New    Target Date 01/11/21             PT Long Term Goals - 12/04/20 1027      PT LONG TERM GOAL #1   Title Pt will improve BERG by at least 3 points in order to demonstrate clinically significant improvement in balance.    Baseline 11/30/20: To be completed at next appointment; 12/04/20: 49/56    Time 12    Period Weeks    Status New    Target Date 02/22/21      PT LONG TERM GOAL #2   Title Pt will improve ABC by at least 13% in order to  demonstrate clinically significant improvement in balance confidence.    Baseline 11/30/20: 67.5%    Time 12    Period Weeks    Status New    Target Date 02/22/21      PT LONG TERM GOAL #3   Title Pt will improve FOTO to at least 55 in order to demonstrate clinically significant improvement in function related to balance and decrease her risk for falls.    Baseline 11/30/20: 46    Time 12    Period Weeks    Status New    Target Date 02/22/21      PT LONG TERM GOAL #4   Title Pt will demonstrate at least 4 point improvement in the FGA in order to demonstrate significant improvement in balance to decrease her fall risk at home and improve safety at home    Baseline 12/03/20:18/30    Time 12    Period Weeks    Status New    Target Date 02/22/21      PT LONG TERM GOAL #5   Title Pt will increase self-selected 10MWT by at least 0.13 m/s in order to demonstrate clinically significant improvement in community ambulation.    Baseline 12/03/20: 11.2s = 0.32m/s    Time 12    Period Weeks    Status New    Target Date 02/22/21                 Plan - 12/14/20 1030    Clinical Impression Statement Pt arrived late so session was abbreviated again today. Focused on strengthening and balance today but worked on more seated exercises due to reports of some knee pain after last session. Pt demonstrates significant fatigue as well as DOE requiring regular seated rest breaks during session. SpO2 remains WNL throughout session. Pt does struggle with head turns during balance on Airex. Pt encouraged to follow-up as scheduled. She will benefit from PT services to address deficits in strength, balance, and mobility in order to return to full function at home.    Personal Factors and  Comorbidities Age;Comorbidity 3+    Comorbidities CHF, A-fib, depression, fibromyalgia, OA    Examination-Activity Limitations Locomotion Level;Stairs;Stand;Transfers    Examination-Participation Restrictions Community  Activity;Other   Showing dogs   Stability/Clinical Decision Making Unstable/Unpredictable    Rehab Potential Good    PT Frequency 2x / week    PT Duration 8 weeks    PT Treatment/Interventions ADLs/Self Care Home Management;Aquatic Therapy;Biofeedback;Canalith Repostioning;Cryotherapy;Electrical Stimulation;Moist Heat;Iontophoresis 4mg /ml Dexamethasone;Traction;Ultrasound;Contrast Bath;DME Instruction;Gait training;Stair training;Functional mobility training;Therapeutic activities;Therapeutic exercise;Balance training;Neuromuscular re-education;Patient/family education;Manual techniques;Passive range of motion;Dry needling;Vestibular;Spinal Manipulations;Joint Manipulations    PT Next Visit Plan Progress balance and strength    PT Home Exercise Plan Access Code: [email or web address]    Consulted and Agree with Plan of Care Patient           Patient will benefit from skilled therapeutic intervention in order to improve the following deficits and impairments:  Decreased balance,Difficulty walking  Visit Diagnosis: Unsteadiness on feet     Problem List Patient Active Problem List   Diagnosis Date Noted  . Acute diastolic heart failure ([place]) 02/10/2020  . Acute respiratory failure with hypoxia ([place]) 02/09/2020  . Hydronephrosis with renal and ureteral calculus obstruction 02/09/2020  . Acute pyelonephritis 02/09/2020  . Acute diastolic CHF (congestive heart failure) ([place]) 02/09/2020  . 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 AF (paroxysmal atrial fibrillation) ([place]) s/p ablation 02/09/2020  . Post-op pain 04/08/2018  . Post-operative pain 04/06/2018  . Atrial fibrillation with RVR ([place]) 12/14/2017  . Hypothyroidism 12/14/2017  . Valvular heart disease 12/14/2017  . Muscle cramps 12/14/2017  . S/P complete thyroidectomy 08/03/2017   Phillips Grout PT, DPT, GCS  Kadar Chance 12/14/2020, 12:59 PM  [facility] [address] [place],  [place], [address] Phone: [phone]   Fax:  [phone]  Name: Teala Daffron [facility] MRN: [id number] Date of Birth: July 22, 1951

## 2020-12-21 ENCOUNTER — Ambulatory Visit: Payer: Medicare HMO

## 2020-12-21 NOTE — Patient Instructions (Incomplete)
TREATMENT   Ther-ex NuStep L0-2 x 5 minutes for warm-up during history;  Seated marches with green tband 2 x 1 minute; Seated clams with green tband 2 x 1 minute; Seated adductor ball squeeze 2 x 1 minute;  Seated LAQ with 3# ankle weights 2 x 1 minute;   Neuromuscular Re-education All balance exercises performed without UE support unless otherwise noted; Tandem balancealternating forward LE 2 x 30s each; Tandem gait in // bars without UE support 10' x 2 lengths; Airex feet together eyes open/closed x 30s each; Airex feet together horizontal and vertical head turns x 30s each;   Pt educated throughout session about proper posture and technique with exercises. Improved exercise technique, movement at target joints, use of target muscles after min to mod verbal, visual, tactile cues.   Pt arrived late so session was abbreviated again today. Focused on strengthening and balance today but worked on more seated exercises due to reports of some knee pain after last session. Pt demonstrates significant fatigue as well as DOE requiring regular seated rest breaks during session. SpO2 remains WNL throughout session. Pt does struggle with head turns during balance on Airex. Pt encouraged to follow-up as scheduled. Shewill benefit from PT services to address deficits in strength, balance, and mobility in order to return to full function at home.

## 2020-12-22 ENCOUNTER — Ambulatory Visit: Payer: Medicare HMO

## 2020-12-22 ENCOUNTER — Other Ambulatory Visit: Payer: Self-pay

## 2020-12-22 DIAGNOSIS — R2681 Unsteadiness on feet: Secondary | ICD-10-CM | POA: Diagnosis not present

## 2020-12-22 NOTE — Therapy (Signed)
[facility] [address] [place], [place], [address] Phone: [phone]   Fax:  [phone]  Physical Therapy Treatment  Patient Details  Name: Mandy [facility] MRN: [id number] Date of Birth: [birth year] Referring Provider (PT): Carloyn Manner   Encounter Date: 12/22/2020   PT End of Session - 12/22/20 1537    Visit Number 5    Number of Visits 17    Date for PT Re-Evaluation 01/25/21    Authorization Type eval: 11/30/20    PT Start Time 1532    PT Stop Time 1615    PT Time Calculation (min) 43 min    Activity Tolerance Patient tolerated treatment well    Behavior During Therapy [facility] for tasks assessed/performed           Past Medical History:  Diagnosis Date  . Arrhythmia    atrial fibrillation  . Arthritis   . CHF (congestive heart failure) ([place])   . Depression   . Dyspnea   . Dysrhythmia   . Fibromyalgia 1992  . GERD (gastroesophageal reflux disease)   . Hepatitis    Hepatitis B 1985  . Hyperlipidemia   . Hypertension   . Mitral valve prolapse   . Sleep apnea    CPAP  . Tendon tear, ankle    rt foot  . Thyroid dysfunction     Past Surgical History:  Procedure Laterality Date  . ACHILLES TENDON SURGERY Right 04/06/2018   Procedure: ACHILLES TENDON REPAIR;  Surgeon: [name], DPM;  Location: [facility];  Service: Podiatry;  Laterality: Right;  . CARDIOVERSION N/A 02/06/2018   Procedure: CARDIOVERSION;  Surgeon: Corey Skains, MD;  Location: [facility];  Service: Cardiovascular;  Laterality: N/A;  . CARDIOVERSION N/A 03/28/2018   Procedure: CARDIOVERSION;  Surgeon: Corey Skains, MD;  Location: [facility];  Service: Cardiovascular;  Laterality: N/A;  . CARDIOVERSION N/A 05/09/2018   Procedure: CARDIOVERSION;  Surgeon: Corey Skains, MD;  Location: [facility];  Service: Cardiovascular;  Laterality: N/A;  . CARDIOVERSION N/A 10/23/2019   Procedure: CARDIOVERSION;  Surgeon: Corey Skains, MD;   Location: [facility];  Service: Cardiovascular;  Laterality: N/A;  . CARDIOVERSION N/A 09/30/2020   Procedure: CARDIOVERSION;  Surgeon: Corey Skains, MD;  Location: [facility];  Service: Cardiovascular;  Laterality: N/A;  . CARDIOVERSION N/A 11/19/2020   Procedure: CARDIOVERSION;  Surgeon: Corey Skains, MD;  Location: [facility];  Service: Cardiovascular;  Laterality: N/A;  . CHOLECYSTECTOMY    . CONTINUOUS NERVE MONITORING N/A 08/03/2017   Procedure: LARYNGEAL NERVE MONITORING;  Surgeon: Carloyn Manner, MD;  Location: [facility];  Service: ENT;  Laterality: N/A;  . EXTRACORPOREAL SHOCK WAVE LITHOTRIPSY Right 02/13/2020   Procedure: EXTRACORPOREAL SHOCK WAVE LITHOTRIPSY (ESWL);  Surgeon: Billey Co, MD;  Location: [facility];  Service: Urology;  Laterality: Right;  . EYE SURGERY Bilateral   . FOOT SURGERY Left 2000  . JOINT REPLACEMENT     left knee  . OSTECTOMY Right 04/06/2018   Procedure: OSTECTOMY-HAGLUNDS/RECTROCALCANEAL;  Surgeon: [name], DPM;  Location: [facility];  Service: Podiatry;  Laterality: Right;  . SHOULDER ARTHROSCOPY Bilateral   . SHOULDER ARTHROSCOPY Left   . THYROIDECTOMY N/A 08/03/2017   Procedure: THYROIDECTOMY;  Surgeon: Carloyn Manner, MD;  Location: [facility];  Service: ENT;  Laterality: N/A;  . TONSILLECTOMY    . TUBAL LIGATION      There were no vitals filed for this visit.   Subjective Assessment - 12/22/20 1533  Subjective Pt reports that she is doing alright today but is having 7/10 bilateral hand soreness upon arrival today. She saw cardiology last Friday who wants to put in a pacemaker. No significant change in her balance since last therapy session. No stumbles or falls. No specific questions currently.    Pertinent History Pt referred to vestibular therapy by Dr. Pryor Ochoa for dizziness. She reports "my balance is off and feel wobbly." Her dizziness started after a fall in September 2021 where she hit the left side of her head and face. She was  not diagnosed with a concussion at the time of her injury. She denies any excessive fatigue, difficulty concentrating, headaches, nausea, blurred vision, emotional lability, or disrupted sleep after her fall. She does complain of bilateral tinnitus which started a couple months after the fall. She was seen by Dr. Pryor Ochoa on 10/23/20 who referred her for a VNG study. VNG was positive for abnormal smooth pursuits, saccades, and optokinetic testing. She also had 10 degrees of right beating nystagmus in the body left position and six degrees of left beating nystagmus in the body right position. Calorics were symmetrical. 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 History of cataract removal and macular degeneration. Due to central findings on VNG she was referred by [facility] to neurology but has not yet received a call to schedule. She had a R knee replacement in May 2021 and reports that she has osteoarthritis in both feet. History of CHF with fluctuating weight. She had a cardioversion two weeks ago. Possible ablation next month. 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 Pt has bilateral carpal tunnel which causes numbness in her hands. No numbness in the feet. Her primary complaint at this time is difficulty with her balance.    Limitations Walking    Diagnostic tests See history    Patient Stated Goals Improve balance so she can train and show her dogs with more confidence    Currently in Pain? Yes    Pain Score 7     Pain Location Hand    Pain Orientation Right;Left    Pain Descriptors / Indicators Aching    Pain Type Chronic pain    Pain Onset In the past 7 days    Pain Frequency Intermittent              TREATMENT   Ther-ex NuStep L0-2 x 5 minutes for warm-up during history;  Seated clams with green tband x 1 minute; Seated adductor ball squeeze x 1 minute;  Seated LAQ  with 3# ankle weights x 1 minute;  Standing exercises with 3# ankle weights: Hip flexion marches x 1 minute; Hip abduction x 1 minute; HS curls x 1 minute; Hip extension x 1 minute;   Neuromuscular Re-education All balance exercises performed without UE support unless otherwise noted; Tandem balancealternating forward LE x 30s each; Tandem gait in // bars without UE support 10' x 4 lengths; Tandem balancealternating forward LE with horizontal and vertical head turns x 30s each; Airex feet apart eyes open/closed x 30s each; Airex alternating 6" cone taps x 10 BLE;l   Pt educated throughout session about proper posture and technique with exercises. Improved exercise technique, movement at target joints, use of target muscles after min to mod verbal, visual, tactile cues.   Focused on strengthening and balance today during session. Pt denies any increase in pain during sesion. She struggles  today with horizontal and vertical head turns in tandem stance. Pt demonstrates less fatigue during session today and vitals remain WNL throughout session. HEP progressed to include single leg balance. Pt encouraged to follow-up as scheduled. Shewill benefit from PT services to address deficits in strength, balance, and mobility in order to return to full function at home.             PT Short Term Goals - 12/01/20 1411      PT SHORT TERM GOAL #1   Title Pt will be independent with HEP in order to improve strength and balance in order to decrease fall risk and improve function at home and when training her dogs    Time 6    Period Weeks    Status New    Target Date 01/11/21             PT Long Term Goals - 12/04/20 1027      PT LONG TERM GOAL #1   Title Pt will improve BERG by at least 3 points in order to demonstrate clinically significant improvement in balance.    Baseline 11/30/20: To be completed at next appointment; 12/04/20: 49/56    Time 12    Period Weeks     Status New    Target Date 02/22/21      PT LONG TERM GOAL #2   Title Pt will improve ABC by at least 13% in order to demonstrate clinically significant improvement in balance confidence.    Baseline 11/30/20: 67.5%    Time 12    Period Weeks    Status New    Target Date 02/22/21      PT LONG TERM GOAL #3   Title Pt will improve FOTO to at least 55 in order to demonstrate clinically significant improvement in function related to balance and decrease her risk for falls.    Baseline 11/30/20: 46    Time 12    Period Weeks    Status New    Target Date 02/22/21      PT LONG TERM GOAL #4   Title Pt will demonstrate at least 4 point improvement in the FGA in order to demonstrate significant improvement in balance to decrease her fall risk at home and improve safety at home    Baseline 12/03/20:18/30    Time 12    Period Weeks    Status New    Target Date 02/22/21      PT LONG TERM GOAL #5   Title Pt will increase self-selected by at least 0.13 m/s in order to demonstrate clinically significant improvement in community ambulation.    Baseline 12/03/20: 11.2s = 0.2m/s    Time 12    Period Weeks    Status New    Target Date 02/22/21                 Plan - 12/22/20 1537    Clinical Impression Statement Focused on strengthening and balance today during session. Pt denies any increase in pain during sesion. She struggles today with horizontal and vertical head turns in tandem stance. Pt demonstrates less fatigue during session today and vitals remain WNL throughout session. HEP progressed to include single leg balance. Pt encouraged to follow-up as scheduled. She will benefit from PT services to address deficits in strength, balance, and mobility in order to return to full function at home.    Personal Factors and Comorbidities Age;Comorbidity 3+    Comorbidities CHF, A-fib, depression,  fibromyalgia, OA    Examination-Activity Limitations Locomotion Level;Stairs;Stand;Transfers     Examination-Participation Restrictions Community Activity;Other   Showing dogs   Stability/Clinical Decision Making Unstable/Unpredictable    Rehab Potential Good    PT Frequency 2x / week    PT Duration 8 weeks    PT Treatment/Interventions ADLs/Self Care Home Management;Aquatic Therapy;Biofeedback;Canalith Repostioning;Cryotherapy;Electrical Stimulation;Moist Heat;Iontophoresis 4mg /ml Dexamethasone;Traction;Ultrasound;Contrast Bath;DME Instruction;Gait training;Stair training;Functional mobility training;Therapeutic activities;Therapeutic exercise;Balance training;Neuromuscular re-education;Patient/family education;Manual techniques;Passive range of motion;Dry needling;Vestibular;Spinal Manipulations;Joint Manipulations    PT Next Visit Plan Progress balance and strength    PT Home Exercise Plan Access Code: [email or web address]    Consulted and Agree with Plan of Care Patient           Patient will benefit from skilled therapeutic intervention in order to improve the following deficits and impairments:  Decreased balance,Difficulty walking  Visit Diagnosis: Unsteadiness on feet     Problem List Patient Active Problem List   Diagnosis Date Noted  . Acute diastolic heart failure ([place]) 02/10/2020  . Acute respiratory failure with hypoxia ([place]) 02/09/2020  . Hydronephrosis with renal and ureteral calculus obstruction 02/09/2020  . Acute pyelonephritis 02/09/2020  . Acute diastolic CHF (congestive heart failure) ([place]) 02/09/2020  . 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 AF (paroxysmal atrial fibrillation) ([place]) s/p ablation 02/09/2020  . Post-op pain 04/08/2018  . Post-operative pain 04/06/2018  . Atrial fibrillation with RVR ([place]) 12/14/2017  . Hypothyroidism 12/14/2017  . Valvular heart disease 12/14/2017  . Muscle cramps 12/14/2017  . S/P complete thyroidectomy 08/03/2017   Phillips Grout PT, DPT, GCS  Mandy Jones 12/22/2020, 4:58 PM  [facility] [address] [place], [place], [address] Phone: [phone]   Fax:  [phone]  Name: Mandy Jones [facility] MRN: [id number] Date of Birth: 1951-01-19

## 2020-12-24 ENCOUNTER — Ambulatory Visit: Payer: Medicare HMO

## 2020-12-24 ENCOUNTER — Other Ambulatory Visit: Payer: Self-pay

## 2020-12-24 DIAGNOSIS — R2681 Unsteadiness on feet: Secondary | ICD-10-CM | POA: Diagnosis not present

## 2020-12-24 NOTE — Therapy (Signed)
Mandy Jones Mandy Jones Mandy Jones [address]. [place], [place], [address] Phone: [phone]   Fax:  [phone]  Physical Therapy Treatment  Patient Details  Name: Mandy Jones MRN: [id number] Date of Birth: [birth year] Referring Provider (PT): Mandy Jones   Encounter Date: 12/24/2020   PT End of Session - 12/24/20 1606    Visit Number 6    Number of Visits 17    Date for PT Re-Evaluation 01/25/21    Authorization Type eval: 11/30/20    PT Start Time 1537    PT Stop Time 1615    PT Time Calculation (min) 38 min    Activity Tolerance Patient tolerated treatment well    Behavior During Therapy Mandy Jones for tasks assessed/performed           Past Medical History:  Diagnosis Date  . Arrhythmia    atrial fibrillation  . Arthritis   . CHF (congestive heart failure) ([place])   . Depression   . Dyspnea   . Dysrhythmia   . Fibromyalgia 1992  . GERD (gastroesophageal reflux disease)   . Hepatitis    Hepatitis B 1985  . Hyperlipidemia   . Hypertension   . Mitral valve prolapse   . Sleep apnea    CPAP  . Tendon tear, ankle    rt foot  . Thyroid dysfunction     Past Surgical History:  Procedure Laterality Date  . ACHILLES TENDON SURGERY Right 04/06/2018   Procedure: ACHILLES TENDON REPAIR;  Surgeon: [name], DPM;  Location: [facility];  Service: Podiatry;  Laterality: Right;  . CARDIOVERSION N/A 02/06/2018   Procedure: CARDIOVERSION;  Surgeon: Corey Skains, MD;  Location: [facility];  Service: Cardiovascular;  Laterality: N/A;  . CARDIOVERSION N/A 03/28/2018   Procedure: CARDIOVERSION;  Surgeon: Corey Skains, MD;  Location: [facility];  Service: Cardiovascular;  Laterality: N/A;  . CARDIOVERSION N/A 05/09/2018   Procedure: CARDIOVERSION;  Surgeon: Corey Skains, MD;  Location: [facility];  Service: Cardiovascular;  Laterality: N/A;  . CARDIOVERSION N/A 10/23/2019   Procedure: CARDIOVERSION;  Surgeon: Corey Skains, MD;   Location: [facility];  Service: Cardiovascular;  Laterality: N/A;  . CARDIOVERSION N/A 09/30/2020   Procedure: CARDIOVERSION;  Surgeon: Corey Skains, MD;  Location: [facility];  Service: Cardiovascular;  Laterality: N/A;  . CARDIOVERSION N/A 11/19/2020   Procedure: CARDIOVERSION;  Surgeon: Corey Skains, MD;  Location: [facility];  Service: Cardiovascular;  Laterality: N/A;  . CHOLECYSTECTOMY    . CONTINUOUS NERVE MONITORING N/A 08/03/2017   Procedure: LARYNGEAL NERVE MONITORING;  Surgeon: Mandy Manner, MD;  Location: [facility];  Service: ENT;  Laterality: N/A;  . EXTRACORPOREAL SHOCK WAVE LITHOTRIPSY Right 02/13/2020   Procedure: EXTRACORPOREAL SHOCK WAVE LITHOTRIPSY (ESWL);  Surgeon: Billey Co, MD;  Location: [facility];  Service: Urology;  Laterality: Right;  . EYE SURGERY Bilateral   . FOOT SURGERY Left 2000  . JOINT REPLACEMENT     left knee  . OSTECTOMY Right 04/06/2018   Procedure: OSTECTOMY-HAGLUNDS/RECTROCALCANEAL;  Surgeon: [name], DPM;  Location: [facility];  Service: Podiatry;  Laterality: Right;  . SHOULDER ARTHROSCOPY Bilateral   . SHOULDER ARTHROSCOPY Left   . THYROIDECTOMY N/A 08/03/2017   Procedure: THYROIDECTOMY;  Surgeon: Mandy Manner, MD;  Location: [facility];  Service: ENT;  Laterality: N/A;  . TONSILLECTOMY    . TUBAL LIGATION      There were no vitals filed for this visit.   Subjective Assessment - 12/24/20 1541  Subjective Pt reports that she is doing alright today. Denies any pain upon arrival. States she had a little LLE soreness after her last therapy session. No significant change in her balance since last therapy session. No stumbles or falls. No specific questions currently.    Pertinent History Pt referred to vestibular therapy by Dr. Pryor Ochoa for dizziness. She reports "my balance is off and feel wobbly." Her dizziness started after a fall in September 2021 where she hit the left side of her head and face. She was not diagnosed with a  concussion at the time of her injury. She denies any excessive fatigue, difficulty concentrating, headaches, nausea, blurred vision, emotional lability, or disrupted sleep after her fall. She does complain of bilateral tinnitus which started a couple months after the fall. She was seen by Dr. Pryor Ochoa on 10/23/20 who referred her for a VNG study. VNG was positive for abnormal smooth pursuits, saccades, and optokinetic testing. She also had 10 degrees of right beating nystagmus in the body left position and six degrees of left beating nystagmus in the body right position. Calorics were symmetrical. 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 History of cataract removal and macular degeneration. Due to central findings on VNG she was referred by [facility] to neurology but has not yet received a call to schedule. She had a R knee replacement in May 2021 and reports that she has osteoarthritis in both feet. History of CHF with fluctuating weight. She had a cardioversion two weeks ago. Possible ablation next month. 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 Pt has bilateral carpal tunnel which causes numbness in her hands. No numbness in the feet. Her primary complaint at this time is difficulty with her balance.    Limitations Walking    Diagnostic tests See history    Patient Stated Goals Improve balance so she can train and show her dogs with more confidence    Currently in Pain? No/denies              TREATMENT   Ther-ex NuStep L0-3 x 5 minutes for warm-up during history;  Seated LAQ with 3# ankle weights x 1 minute;  Standing exercises with 3# ankle weights: Hip flexion marches x 1 minute; Hip abduction x 1 minute; HS curls x 1 minute; Hip extension x 1 minute;   Neuromuscular Re-education All balance exercises performed without UE support unless otherwise noted;  Gait  outside on grass/mulch: Forward/backwards eyes open/closed x 2 minutes each; Lateral stepping eyes open x 2 minutes each direction; Forward walking with horizontal and vertical head turns x 2 minutes each;  Airex tandem balancealternating forward LEx 30s each; Airex balance beam side stepping x 6 lengths;   Pt educated throughout session about proper posture and technique with exercises. Improved exercise technique, movement at target joints, use of target muscles after min to mod verbal, visual, tactile cues.   Focused on strengthening and balance todayduring session. Worked outside on grass today to challenge balance on uneven surfaces and with eyes closed. Continued with seated and standing exercises today to increase LE strength. Pt denies any increase in pain during session. Pt encouraged to continue HEP and follow-up as scheduled. Shewill benefit from PT services to address deficits in strength, balance, and mobility in order to return to full function at home.  PT Short Term Goals - 12/01/20 1411      PT SHORT TERM GOAL #1   Title Pt will be independent with HEP in order to improve strength and balance in order to decrease fall risk and improve function at home and when training her dogs    Time 6    Period Weeks    Status New    Target Date 01/11/21             PT Long Term Goals - 12/04/20 1027      PT LONG TERM GOAL #1   Title Pt will improve BERG by at least 3 points in order to demonstrate clinically significant improvement in balance.    Baseline 11/30/20: To be completed at next appointment; 12/04/20: 49/56    Time 12    Period Weeks    Status New    Target Date 02/22/21      PT LONG TERM GOAL #2   Title Pt will improve ABC by at least 13% in order to demonstrate clinically significant improvement in balance confidence.    Baseline 11/30/20: 67.5%    Time 12    Period Weeks    Status New    Target Date  02/22/21      PT LONG TERM GOAL #3   Title Pt will improve FOTO to at least 55 in order to demonstrate clinically significant improvement in function related to balance and decrease her risk for falls.    Baseline 11/30/20: 46    Time 12    Period Weeks    Status New    Target Date 02/22/21      PT LONG TERM GOAL #4   Title Pt will demonstrate at least 4 point improvement in the FGA in order to demonstrate significant improvement in balance to decrease her fall risk at home and improve safety at home    Baseline 12/03/20:18/30    Time 12    Period Weeks    Status New    Target Date 02/22/21      PT LONG TERM GOAL #5   Title Pt will increase self-selected 10MWT by at least 0.13 m/s in order to demonstrate clinically significant improvement in community ambulation.    Baseline 12/03/20: 11.2s = 0.14m/s    Time 12    Period Weeks    Status New    Target Date 02/22/21                 Plan - 12/24/20 1607    Clinical Impression Statement Focused on strengthening and balance today during session. Worked outside on grass today to challenge balance on uneven surfaces and with eyes closed. Continued with seated and standing exercises today to increase LE strength. Pt denies any increase in pain during session. Pt encouraged to continue HEP and follow-up as scheduled. She will benefit from PT services to address deficits in strength, balance, and mobility in order to return to full function at home.    Personal Factors and Comorbidities Age;Comorbidity 3+    Comorbidities CHF, A-fib, depression, fibromyalgia, OA    Examination-Activity Limitations Locomotion Level;Stairs;Stand;Transfers    Examination-Participation Restrictions Community Activity;Other   Showing dogs   Stability/Clinical Decision Making Unstable/Unpredictable    Rehab Potential Good    PT Frequency 2x / week    PT Duration 8 weeks    PT Treatment/Interventions ADLs/Self Care Home Management;Aquatic  Therapy;Biofeedback;Canalith Repostioning;Cryotherapy;Electrical Stimulation;Moist Heat;Iontophoresis 4mg /ml Dexamethasone;Traction;Ultrasound;Contrast Bath;DME Instruction;Gait training;Stair training;Functional mobility training;Therapeutic activities;Therapeutic exercise;Balance training;Neuromuscular re-education;Patient/family  education;Manual techniques;Passive range of motion;Dry needling;Vestibular;Spinal Manipulations;Joint Manipulations    PT Next Visit Plan Progress balance and strength    PT Home Exercise Plan Access Code: [email or web address]    Consulted and Agree with Plan of Care Patient           Patient will benefit from skilled therapeutic intervention in order to improve the following deficits and impairments:  Decreased balance,Difficulty walking  Visit Diagnosis: Unsteadiness on feet     Problem List Patient Active Problem List   Diagnosis Date Noted  . Acute diastolic heart failure ([place]) 02/10/2020  . Acute respiratory failure with hypoxia ([place]) 02/09/2020  . Hydronephrosis with renal and ureteral calculus obstruction 02/09/2020  . Acute pyelonephritis 02/09/2020  . Acute diastolic CHF (congestive heart failure) ([place]) 02/09/2020  . 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 AF (paroxysmal atrial fibrillation) ([place]) s/p ablation 02/09/2020  . Post-op pain 04/08/2018  . Post-operative pain 04/06/2018  . Atrial fibrillation with RVR ([place]) 12/14/2017  . Hypothyroidism 12/14/2017  . Valvular heart disease 12/14/2017  . Muscle cramps 12/14/2017  . S/P complete thyroidectomy 08/03/2017   Phillips Grout PT, DPT, GCS  Tabbetha Kutscher 12/25/2020, 12:40 PM  [place] [facility] [address]. [place], [place], [address] Phone: [phone]   Fax:  [phone]  Name: Mandy Jones MRN: [id number] Date of Birth: 06-19-1951

## 2020-12-25 NOTE — Patient Instructions (Incomplete)
TREATMENT   Ther-ex NuStep L0-3 x 5 minutes for warm-up during history;  Seated LAQ with 3# ankle weights x 1 minute;  Standing exercises with 3# ankle weights: Hip flexion marches x 1 minute; Hip abduction x 1 minute; HS curls x 1 minute; Hip extension x 1 minute;   Neuromuscular Re-education All balance exercises performed without UE support unless otherwise noted;  Gait outside on grass/mulch: Forward/backwards eyes open/closed x 2 minutes each; Lateral stepping eyes open x 2 minutes each direction; Forward walking with horizontal and vertical head turns x 2 minutes each;  Airex tandem balancealternating forward LEx 30s each; Airex balance beam side stepping x 6 lengths;   Pt educated throughout session about proper posture and technique with exercises. Improved exercise technique, movement at target joints, use of target muscles after min to mod verbal, visual, tactile cues.   Focused on strengthening and balance todayduring session. Worked outside on grass today to challenge balance on uneven surfaces and with eyes closed. Continued with seated and standing exercises today to increase LE strength. Pt denies any increase in pain during session. Pt encouraged to continue HEP and follow-up as scheduled. Shewill benefit from PT services to address deficits in strength, balance, and mobility in order to return to full function at home.

## 2020-12-28 ENCOUNTER — Ambulatory Visit: Payer: Medicare HMO

## 2020-12-28 DIAGNOSIS — R2681 Unsteadiness on feet: Secondary | ICD-10-CM

## 2020-12-28 NOTE — Patient Instructions (Incomplete)
TREATMENT   Ther-ex NuStep L0-3 x 5 minutes for warm-up during history;  Seated LAQ with 3# ankle weights x 1 minute;  Standing exercises with 3# ankle weights: Hip flexion marches x 1 minute; Hip abduction x 1 minute; HS curls x 1 minute; Hip extension x 1 minute;   Neuromuscular Re-education All balance exercises performed without UE support unless otherwise noted;  Gait outside on grass/mulch: Forward/backwards eyes open/closed x 2 minutes each; Lateral stepping eyes open x 2 minutes each direction; Forward walking with horizontal and vertical head turns x 2 minutes each;  Airex tandem balancealternating forward LEx 30s each; Airex balance beam side stepping x 6 lengths;   Pt educated throughout session about proper posture and technique with exercises. Improved exercise technique, movement at target joints, use of target muscles after min to mod verbal, visual, tactile cues.   Focused on strengthening and balance todayduring session. Worked outside on grass today to challenge balance on uneven surfaces and with eyes closed. Continued with seated and standing exercises today to increase LE strength. Pt denies any increase in pain during session. Pt encouraged to continue HEP and follow-up as scheduled. Shewill benefit from PT services to address deficits in strength, balance, and mobility in order to return to full function at home. 

## 2020-12-30 ENCOUNTER — Ambulatory Visit: Payer: Medicare HMO | Attending: Otolaryngology

## 2020-12-30 ENCOUNTER — Other Ambulatory Visit: Payer: Self-pay

## 2020-12-30 DIAGNOSIS — R2681 Unsteadiness on feet: Secondary | ICD-10-CM | POA: Insufficient documentation

## 2020-12-30 NOTE — Therapy (Signed)
[facility] [address] [place], [place], [address] Phone: [phone]   Fax:  [phone]  Physical Therapy Treatment  Patient Details  Name: Mandy Jones [facility] MRN: [id number] Date of Birth: 1950/08/31 Referring Provider (PT): Carloyn Manner   Encounter Date: 12/30/2020   PT End of Session - 12/30/20 1030    Visit Number 7    Number of Visits 17    Date for PT Re-Evaluation 01/25/21    Authorization Type eval: 11/30/20    PT Start Time 1027    PT Stop Time 1100    PT Time Calculation (min) 33 min    Activity Tolerance Patient tolerated treatment well    Behavior During Therapy [facility] for tasks assessed/performed           Past Medical History:  Diagnosis Date  . Arrhythmia    atrial fibrillation  . Arthritis   . CHF (congestive heart failure) ([place])   . Depression   . Dyspnea   . Dysrhythmia   . Fibromyalgia 1992  . GERD (gastroesophageal reflux disease)   . Hepatitis    Hepatitis B 1985  . Hyperlipidemia   . Hypertension   . Mitral valve prolapse   . Sleep apnea    CPAP  . Tendon tear, ankle    rt foot  . Thyroid dysfunction     Past Surgical History:  Procedure Laterality Date  . ACHILLES TENDON SURGERY Right 04/06/2018   Procedure: ACHILLES TENDON REPAIR;  Surgeon: [name], DPM;  Location: [facility];  Service: Podiatry;  Laterality: Right;  . CARDIOVERSION N/A 02/06/2018   Procedure: CARDIOVERSION;  Surgeon: Corey Skains, MD;  Location: [facility];  Service: Cardiovascular;  Laterality: N/A;  . CARDIOVERSION N/A 03/28/2018   Procedure: CARDIOVERSION;  Surgeon: Corey Skains, MD;  Location: [facility];  Service: Cardiovascular;  Laterality: N/A;  . CARDIOVERSION N/A 05/09/2018   Procedure: CARDIOVERSION;  Surgeon: Corey Skains, MD;  Location: [facility];  Service: Cardiovascular;  Laterality: N/A;  . CARDIOVERSION N/A 10/23/2019   Procedure: CARDIOVERSION;  Surgeon: Corey Skains, MD;   Location: [facility];  Service: Cardiovascular;  Laterality: N/A;  . CARDIOVERSION N/A 09/30/2020   Procedure: CARDIOVERSION;  Surgeon: Corey Skains, MD;  Location: [facility];  Service: Cardiovascular;  Laterality: N/A;  . CARDIOVERSION N/A 11/19/2020   Procedure: CARDIOVERSION;  Surgeon: Corey Skains, MD;  Location: [facility];  Service: Cardiovascular;  Laterality: N/A;  . CHOLECYSTECTOMY    . CONTINUOUS NERVE MONITORING N/A 08/03/2017   Procedure: LARYNGEAL NERVE MONITORING;  Surgeon: Carloyn Manner, MD;  Location: [facility];  Service: ENT;  Laterality: N/A;  . EXTRACORPOREAL SHOCK WAVE LITHOTRIPSY Right 02/13/2020   Procedure: EXTRACORPOREAL SHOCK WAVE LITHOTRIPSY (ESWL);  Surgeon: Billey Co, MD;  Location: [facility];  Service: Urology;  Laterality: Right;  . EYE SURGERY Bilateral   . FOOT SURGERY Left 2000  . JOINT REPLACEMENT     left knee  . OSTECTOMY Right 04/06/2018   Procedure: OSTECTOMY-HAGLUNDS/RECTROCALCANEAL;  Surgeon: [name], DPM;  Location: [facility];  Service: Podiatry;  Laterality: Right;  . SHOULDER ARTHROSCOPY Bilateral   . SHOULDER ARTHROSCOPY Left   . THYROIDECTOMY N/A 08/03/2017   Procedure: THYROIDECTOMY;  Surgeon: Carloyn Manner, MD;  Location: [facility];  Service: ENT;  Laterality: N/A;  . TONSILLECTOMY    . TUBAL LIGATION      There were no vitals filed for this visit.   Subjective Assessment - 12/30/20 1029  Subjective Pt reports that she is doing alright today. She reports fatigue today as well as 6/10 neck pain. Denies any soreness after the last therapy session. No significant change in her balance since last therapy session. No stumbles or falls. No specific questions currently.    Pertinent History Pt referred to vestibular therapy by Dr. Pryor Ochoa for dizziness. She reports "my balance is off and feel wobbly." Her dizziness started after a fall in September 2021 where she hit the left side of her head and face. She was not diagnosed with a  concussion at the time of her injury. She denies any excessive fatigue, difficulty concentrating, headaches, nausea, blurred vision, emotional lability, or disrupted sleep after her fall. She does complain of bilateral tinnitus which started a couple months after the fall. She was seen by Dr. Pryor Ochoa on 10/23/20 who referred her for a VNG study. VNG was positive for abnormal smooth pursuits, saccades, and optokinetic testing. She also had 10 degrees of right beating nystagmus in the body left position and six degrees of left beating nystagmus in the body right position. Calorics were symmetrical. 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 History of cataract removal and macular degeneration. Due to central findings on VNG she was referred by [facility] to neurology but has not yet received a call to schedule. She had a R knee replacement in May 2021 and reports that she has osteoarthritis in both feet. History of CHF with fluctuating weight. She had a cardioversion two weeks ago. Possible ablation next month. 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 Pt has bilateral carpal tunnel which causes numbness in her hands. No numbness in the feet. Her primary complaint at this time is difficulty with her balance.    Limitations Walking    Diagnostic tests See history    Patient Stated Goals Improve balance so she can train and show her dogs with more confidence    Currently in Pain? Yes    Pain Score 6     Pain Location Neck    Pain Orientation Posterior    Pain Descriptors / Indicators Aching    Pain Type Chronic pain    Pain Onset In the past 7 days    Pain Frequency Intermittent                 TREATMENT   Ther-ex NuStep L1-3 x 5 minutes for warm-up during history with therapist monitoring fatigue and adjusting resistance, pt maintaining SPM >75;  Seated LAQ with 4# ankle weights x  1 minute;  Standing exercises with 4# ankle weights: Hip flexion marches x 1 minute; Hip abduction x 1 minute; HS curls x 1 minute; Hip extension x 1 minute;   Neuromuscular Re-education All balance exercises performed without UE support unless otherwise noted;  Airex feet together eyes open/closed x 30s each; Airex feet together eyes open with horizontal and vertical head turns x 30s each; Airex alternating 6" cone taps x 10 BLE; Gait in hallway with horizontal ball tosses with head/eye follow x 75' each direction;   Pt educated throughout session about proper posture and technique with exercises. Improved exercise technique, movement at target joints, use of target muscles after min to mod verbal, visual, tactile cues.   Pt arrived late so session is somewhat limited today. Focused on strengthening and balance todayduring session. Progressed ankle weights to 4 pounds today. Continued with balance on  Airex pad. Pt denies any increase in pain during session. She becomes tearful at the end of session due to frustration related to her balance deficits. Pt encouraged to continue HEP and follow-up as scheduled. Shewill benefit from PT services to address deficits in strength, balance, and mobility in order to return to full function at home.                       PT Short Term Goals - 12/01/20 1411      PT SHORT TERM GOAL #1   Title Pt will be independent with HEP in order to improve strength and balance in order to decrease fall risk and improve function at home and when training her dogs    Time 6    Period Weeks    Status New    Target Date 01/11/21             PT Long Term Goals - 12/04/20 1027      PT LONG TERM GOAL #1   Title Pt will improve BERG by at least 3 points in order to demonstrate clinically significant improvement in balance.    Baseline 11/30/20: To be completed at next appointment; 12/04/20: 49/56    Time 12    Period Weeks     Status New    Target Date 02/22/21      PT LONG TERM GOAL #2   Title Pt will improve ABC by at least 13% in order to demonstrate clinically significant improvement in balance confidence.    Baseline 11/30/20: 67.5%    Time 12    Period Weeks    Status New    Target Date 02/22/21      PT LONG TERM GOAL #3   Title Pt will improve FOTO to at least 55 in order to demonstrate clinically significant improvement in function related to balance and decrease her risk for falls.    Baseline 11/30/20: 46    Time 12    Period Weeks    Status New    Target Date 02/22/21      PT LONG TERM GOAL #4   Title Pt will demonstrate at least 4 point improvement in the FGA in order to demonstrate significant improvement in balance to decrease her fall risk at home and improve safety at home    Baseline 12/03/20:18/30    Time 12    Period Weeks    Status New    Target Date 02/22/21      PT LONG TERM GOAL #5   Title Pt will increase self-selected 10MWT by at least 0.13 m/s in order to demonstrate clinically significant improvement in community ambulation.    Baseline 12/03/20: 11.2s = 0.51m/s    Time 12    Period Weeks    Status New    Target Date 02/22/21                 Plan - 12/30/20 1030    Clinical Impression Statement Pt arrived late so session is somewhat limited today. Focused on strengthening and balance today during session. Progressed ankle weights to 4 pounds today. Continued with balance on Airex pad. Pt denies any increase in pain during session. She becomes tearful at the end of session due to frustration related to her balance deficits. Pt encouraged to continue HEP and follow-up as scheduled. She will benefit from PT services to address deficits in strength, balance, and mobility in order to return to full function  at home.    Personal Factors and Comorbidities Age;Comorbidity 3+    Comorbidities CHF, A-fib, depression, fibromyalgia, OA    Examination-Activity Limitations Locomotion  Level;Stairs;Stand;Transfers    Examination-Participation Restrictions Community Activity;Other   Showing dogs   Stability/Clinical Decision Making Unstable/Unpredictable    Rehab Potential Good    PT Frequency 2x / week    PT Duration 8 weeks    PT Treatment/Interventions ADLs/Self Care Home Management;Aquatic Therapy;Biofeedback;Canalith Repostioning;Cryotherapy;Electrical Stimulation;Moist Heat;Iontophoresis 4mg /ml Dexamethasone;Traction;Ultrasound;Contrast Bath;DME Instruction;Gait training;Stair training;Functional mobility training;Therapeutic activities;Therapeutic exercise;Balance training;Neuromuscular re-education;Patient/family education;Manual techniques;Passive range of motion;Dry needling;Vestibular;Spinal Manipulations;Joint Manipulations    PT Next Visit Plan Progress balance and strength    PT Home Exercise Plan Access Code: [email or web address]    Consulted and Agree with Plan of Care Patient           Patient will benefit from skilled therapeutic intervention in order to improve the following deficits and impairments:  Decreased balance,Difficulty walking  Visit Diagnosis: Unsteadiness on feet     Problem List Patient Active Problem List   Diagnosis Date Noted  . Acute diastolic heart failure ([place]) 02/10/2020  . Acute respiratory failure with hypoxia ([place]) 02/09/2020  . Hydronephrosis with renal and ureteral calculus obstruction 02/09/2020  . Acute pyelonephritis 02/09/2020  . Acute diastolic CHF (congestive heart failure) ([place]) 02/09/2020  . 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 AF (paroxysmal atrial fibrillation) ([place]) s/p ablation 02/09/2020  . Post-op pain 04/08/2018  . Post-operative pain 04/06/2018  . Atrial fibrillation with RVR ([place]) 12/14/2017  . Hypothyroidism 12/14/2017  . Valvular heart disease 12/14/2017  . Muscle cramps 12/14/2017  . S/P complete thyroidectomy 08/03/2017   Phillips Grout PT, DPT, GCS  Devorah Givhan 12/30/2020, 9:02 PM  [facility] [address]. [place], [place], [address] Phone: [phone]   Fax:  [phone]  Name: Mandy Jones [facility] MRN: [id number] Date of Birth: [birth year]

## 2021-01-01 NOTE — Patient Instructions (Incomplete)
TREATMENT   Ther-ex NuStep L1-3x 5 minutes for warm-up during history with therapist monitoring fatigue and adjusting resistance, pt maintaining SPM >75;  Seated LAQ with 4# ankle weights x 1 minute;  Standing exercises with 4# ankle weights: Hip flexion marches x 1 minute; Hip abduction x 1 minute; HS curls x 1 minute; Hip extension x 1 minute;   Neuromuscular Re-education All balance exercises performed without UE support unless otherwise noted;  Airex feet together eyes open/closed x 30s each; Airex feet together eyes open with horizontal and vertical head turns x 30s each; Airex alternating 6" cone taps x 10 BLE; Gait in hallway with horizontal ball tosses with head/eye follow x 75' each direction;   Pt educated throughout session about proper posture and technique with exercises. Improved exercise technique, movement at target joints, use of target muscles after min to mod verbal, visual, tactile cues.   Pt arrived late so session is somewhat limited today. Focused on strengthening and balance todayduring session. Progressed ankle weights to 4 pounds today. Continued with balance on Airex pad. Pt denies any increase in pain during session. She becomes tearful at the end of session due to frustration related to her balance deficits. Pt encouraged tocontinue HEP andfollow-up as scheduled. Shewill benefit from PT services to address deficits in strength, balance, and mobility in order to return to full function at home.  

## 2021-01-04 ENCOUNTER — Ambulatory Visit: Payer: Medicare HMO

## 2021-01-04 DIAGNOSIS — R2681 Unsteadiness on feet: Secondary | ICD-10-CM

## 2021-01-06 DIAGNOSIS — Z01818 Encounter for other preprocedural examination: Secondary | ICD-10-CM | POA: Insufficient documentation

## 2021-01-06 NOTE — Patient Instructions (Incomplete)
TREATMENT   Ther-ex NuStep L1-3x 5 minutes for warm-up during history with therapist monitoring fatigue and adjusting resistance, pt maintaining SPM >75;  Seated LAQ with 4# ankle weights x 1 minute;  Standing exercises with 4# ankle weights: Hip flexion marches x 1 minute; Hip abduction x 1 minute; HS curls x 1 minute; Hip extension x 1 minute;   Neuromuscular Re-education All balance exercises performed without UE support unless otherwise noted;  Airex feet together eyes open/closed x 30s each; Airex feet together eyes open with horizontal and vertical head turns x 30s each; Airex alternating 6" cone taps x 10 BLE; Gait in hallway with horizontal ball tosses with head/eye follow x 75' each direction;   Pt educated throughout session about proper posture and technique with exercises. Improved exercise technique, movement at target joints, use of target muscles after min to mod verbal, visual, tactile cues.   Pt arrived late so session is somewhat limited today. Focused on strengthening and balance todayduring session. Progressed ankle weights to 4 pounds today. Continued with balance on Airex pad. Pt denies any increase in pain during session. She becomes tearful at the end of session due to frustration related to her balance deficits. Pt encouraged tocontinue HEP andfollow-up as scheduled. Shewill benefit from PT services to address deficits in strength, balance, and mobility in order to return to full function at home.  

## 2021-01-07 ENCOUNTER — Other Ambulatory Visit: Payer: Self-pay

## 2021-01-07 ENCOUNTER — Ambulatory Visit: Payer: Medicare HMO

## 2021-01-07 VITALS — BP 137/75 | HR 72

## 2021-01-07 DIAGNOSIS — R2681 Unsteadiness on feet: Secondary | ICD-10-CM | POA: Diagnosis not present

## 2021-01-07 NOTE — Therapy (Signed)
[facility] [address]. [place], [place], [address] Phone: [phone]   Fax:  [phone]  Physical Therapy Treatment  Patient Details  Name: Mandy Jones MRN: [id number] Date of Birth: [birth year] Referring Provider (PT): Carloyn Manner   Encounter Date: 01/07/2021   PT End of Session - 01/08/21 1451    Visit Number 8    Number of Visits 17    Date for PT Re-Evaluation 01/25/21    Authorization Type eval: 11/30/20    PT Start Time 1105    PT Stop Time 1147    PT Time Calculation (min) 42 min    Activity Tolerance Patient tolerated treatment well    Behavior During Therapy [facility] for tasks assessed/performed           Past Medical History:  Diagnosis Date  . Arrhythmia    atrial fibrillation  . Arthritis   . CHF (congestive heart failure) ([place])   . Depression   . Dyspnea   . Dysrhythmia   . Fibromyalgia 1992  . GERD (gastroesophageal reflux disease)   . Hepatitis    Hepatitis B 1985  . Hyperlipidemia   . Hypertension   . Mitral valve prolapse   . Sleep apnea    CPAP  . Tendon tear, ankle    rt foot  . Thyroid dysfunction     Past Surgical History:  Procedure Laterality Date  . ACHILLES TENDON SURGERY Right 04/06/2018   Procedure: ACHILLES TENDON REPAIR;  Surgeon: [name], DPM;  Location: [facility];  Service: Podiatry;  Laterality: Right;  . CARDIOVERSION N/A 02/06/2018   Procedure: CARDIOVERSION;  Surgeon: Corey Skains, MD;  Location: [facility];  Service: Cardiovascular;  Laterality: N/A;  . CARDIOVERSION N/A 03/28/2018   Procedure: CARDIOVERSION;  Surgeon: Corey Skains, MD;  Location: [facility];  Service: Cardiovascular;  Laterality: N/A;  . CARDIOVERSION N/A 05/09/2018   Procedure: CARDIOVERSION;  Surgeon: Corey Skains, MD;  Location: [facility];  Service: Cardiovascular;  Laterality: N/A;  . CARDIOVERSION N/A 10/23/2019   Procedure: CARDIOVERSION;  Surgeon: Corey Skains, MD;   Location: [facility];  Service: Cardiovascular;  Laterality: N/A;  . CARDIOVERSION N/A 09/30/2020   Procedure: CARDIOVERSION;  Surgeon: Corey Skains, MD;  Location: [facility];  Service: Cardiovascular;  Laterality: N/A;  . CARDIOVERSION N/A 11/19/2020   Procedure: CARDIOVERSION;  Surgeon: Corey Skains, MD;  Location: [facility];  Service: Cardiovascular;  Laterality: N/A;  . CHOLECYSTECTOMY    . CONTINUOUS NERVE MONITORING N/A 08/03/2017   Procedure: LARYNGEAL NERVE MONITORING;  Surgeon: Carloyn Manner, MD;  Location: [facility];  Service: ENT;  Laterality: N/A;  . EXTRACORPOREAL SHOCK WAVE LITHOTRIPSY Right 02/13/2020   Procedure: EXTRACORPOREAL SHOCK WAVE LITHOTRIPSY (ESWL);  Surgeon: Billey Co, MD;  Location: [facility];  Service: Urology;  Laterality: Right;  . EYE SURGERY Bilateral   . FOOT SURGERY Left 2000  . JOINT REPLACEMENT     left knee  . OSTECTOMY Right 04/06/2018   Procedure: OSTECTOMY-HAGLUNDS/RECTROCALCANEAL;  Surgeon: [name], DPM;  Location: [facility];  Service: Podiatry;  Laterality: Right;  . SHOULDER ARTHROSCOPY Bilateral   . SHOULDER ARTHROSCOPY Left   . THYROIDECTOMY N/A 08/03/2017   Procedure: THYROIDECTOMY;  Surgeon: Carloyn Manner, MD;  Location: [facility];  Service: ENT;  Laterality: N/A;  . TONSILLECTOMY    . TUBAL LIGATION      Vitals:   01/07/21 1109  BP: 137/75  Pulse: 72  SpO2: 100%  Subjective Assessment - 01/07/21 1124    Subjective Pt reports that she is doing alright today. She reports fatigue yesterday but better today. Her neck remains sore. Denies any excessive muscle soreness after the last therapy session. No stumbles or falls. No specific questions currently.    Pertinent History Pt referred to vestibular therapy by Dr. Pryor Ochoa for dizziness. She reports "my balance is off and feel wobbly." Her dizziness started after a fall in September 2021 where she hit the left side of her head and face. She was not diagnosed with a  concussion at the time of her injury. She denies any excessive fatigue, difficulty concentrating, headaches, nausea, blurred vision, emotional lability, or disrupted sleep after her fall. She does complain of bilateral tinnitus which started a couple months after the fall. She was seen by Dr. Pryor Ochoa on 10/23/20 who referred her for a VNG study. VNG was positive for abnormal smooth pursuits, saccades, and optokinetic testing. She also had 10 degrees of right beating nystagmus in the body left position and six degrees of left beating nystagmus in the body right position. Calorics were symmetrical. 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 History of cataract removal and macular degeneration. Due to central findings on VNG she was referred by [facility] to neurology but has not yet received a call to schedule. She had a R knee replacement in May 2021 and reports that she has osteoarthritis in both feet. History of CHF with fluctuating weight. She had a cardioversion two weeks ago. Possible ablation next month. 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 Pt has bilateral carpal tunnel which causes numbness in her hands. No numbness in the feet. Her primary complaint at this time is difficulty with her balance.    Limitations Walking    Diagnostic tests See history    Patient Stated Goals Improve balance so she can train and show her dogs with more confidence    Currently in Pain? Yes    Pain Score --   Does not rate   Pain Location Neck    Pain Orientation Posterior    Pain Descriptors / Indicators Aching    Pain Type Chronic pain    Pain Onset In the past 7 days    Pain Frequency Intermittent            TREATMENT   Ther-ex NuStep L1-3x 5 minutes for warm-up during history with therapist monitoring fatigue and adjusting resistance;  Seated LAQ with 4# ankle weights x 1  minute;  Standing exercises with 4# ankle weights: Hip flexion marches x 1 minute; Hip abduction x 1 minute; HS curls x 1 minute; Hip extension x 1 minute;  Standing heel raises x 30s; Side stepping with 4# AW x 6 lengths;   Neuromuscular Re-education All balance exercises performed without UE support unless otherwise noted;  Airex feet together eyes open/closed x 30s each; Airex feet together eyes open with horizontal and vertical head turns x 30s each; Airex balance beam tandem stance alternating forward LE x 30s each;  Seated rest breaks   Pt educated throughout session about proper posture and technique with exercises. Improved exercise technique, movement at target joints, use of target muscles after min to mod verbal, visual, tactile cues.   Pt demonstrates excellent motivation during session today. Focused on strengthening and balanceduring session. Continued with 4# ankle weights today for exercises. Continued with balance on Airex  pad. Pt denies any increase in pain during session. Pt encouraged tocontinue HEP andfollow-up as scheduled. Shewill benefit from PT services to address deficits in strength, balance, and mobility in order to return to full function at home.                           PT Short Term Goals - 12/01/20 1411      PT SHORT TERM GOAL #1   Title Pt will be independent with HEP in order to improve strength and balance in order to decrease fall risk and improve function at home and when training her dogs    Time 6    Period Weeks    Status New    Target Date 01/11/21             PT Long Term Goals - 12/04/20 1027      PT LONG TERM GOAL #1   Title Pt will improve BERG by at least 3 points in order to demonstrate clinically significant improvement in balance.    Baseline 11/30/20: To be completed at next appointment; 12/04/20: 49/56    Time 12    Period Weeks    Status New    Target Date 02/22/21      PT LONG  TERM GOAL #2   Title Pt will improve ABC by at least 13% in order to demonstrate clinically significant improvement in balance confidence.    Baseline 11/30/20: 67.5%    Time 12    Period Weeks    Status New    Target Date 02/22/21      PT LONG TERM GOAL #3   Title Pt will improve FOTO to at least 55 in order to demonstrate clinically significant improvement in function related to balance and decrease her risk for falls.    Baseline 11/30/20: 46    Time 12    Period Weeks    Status New    Target Date 02/22/21      PT LONG TERM GOAL #4   Title Pt will demonstrate at least 4 point improvement in the FGA in order to demonstrate significant improvement in balance to decrease her fall risk at home and improve safety at home    Baseline 12/03/20:18/30    Time 12    Period Weeks    Status New    Target Date 02/22/21      PT LONG TERM GOAL #5   Title Pt will increase self-selected by at least 0.13 m/s in order to demonstrate clinically significant improvement in community ambulation.    Baseline 12/03/20: 11.2s = 0.59m/s    Time 12    Period Weeks    Status New    Target Date 02/22/21                 Plan - 01/07/21 1118    Clinical Impression Statement Pt demonstrates excellent motivation during session today. Focused on strengthening and balance during session. Continued with 4# ankle weights today for exercises. Continued with balance on Airex pad. Pt denies any increase in pain during session. Pt encouraged to continue HEP and follow-up as scheduled. She will benefit from PT services to address deficits in strength, balance, and mobility in order to return to full function at home.    Personal Factors and Comorbidities Age;Comorbidity 3+    Comorbidities CHF, A-fib, depression, fibromyalgia, OA    Examination-Activity Limitations Locomotion Level;Stairs;Stand;Transfers    Examination-Participation Restrictions Community  Activity;Other   Showing dogs   Stability/Clinical  Decision Making Unstable/Unpredictable    Rehab Potential Good    PT Frequency 2x / week    PT Duration 8 weeks    PT Treatment/Interventions ADLs/Self Care Home Management;Aquatic Therapy;Biofeedback;Canalith Repostioning;Cryotherapy;Electrical Stimulation;Moist Heat;Iontophoresis 4mg /ml Dexamethasone;Traction;Ultrasound;Contrast Bath;DME Instruction;Gait training;Stair training;Functional mobility training;Therapeutic activities;Therapeutic exercise;Balance training;Neuromuscular re-education;Patient/family education;Manual techniques;Passive range of motion;Dry needling;Vestibular;Spinal Manipulations;Joint Manipulations    PT Next Visit Plan Progress balance and strength    PT Home Exercise Plan Access Code: [email or web address]    Consulted and Agree with Plan of Care Patient           Patient will benefit from skilled therapeutic intervention in order to improve the following deficits and impairments:  Decreased balance,Difficulty walking  Visit Diagnosis: Unsteadiness on feet     Problem List Patient Active Problem List   Diagnosis Date Noted  . Acute diastolic heart failure ([place]) 02/10/2020  . Acute respiratory failure with hypoxia ([place]) 02/09/2020  . Hydronephrosis with renal and ureteral calculus obstruction 02/09/2020  . Acute pyelonephritis 02/09/2020  . Acute diastolic CHF (congestive heart failure) ([place]) 02/09/2020  . 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 AF (paroxysmal atrial fibrillation) ([place]) s/p ablation 02/09/2020  . Post-op pain 04/08/2018  . Post-operative pain 04/06/2018  . Atrial fibrillation with RVR ([place]) 12/14/2017  . Hypothyroidism 12/14/2017  . Valvular heart disease 12/14/2017  . Muscle cramps 12/14/2017  . S/P complete thyroidectomy 08/03/2017   Phillips Grout PT, DPT, GCS  Damon Baisch 01/08/2021, 2:55 PM  [facility] [address] [place], [place], [address] Phone: [phone]   Fax:   [phone]  Name: Mandy Jones [facility] MRN: [id number] Date of Birth: [birth year]

## 2021-01-11 ENCOUNTER — Other Ambulatory Visit: Payer: Self-pay | Admitting: Neurology

## 2021-01-11 ENCOUNTER — Ambulatory Visit: Payer: Medicare HMO

## 2021-01-11 DIAGNOSIS — R2689 Other abnormalities of gait and mobility: Secondary | ICD-10-CM

## 2021-01-11 NOTE — Patient Instructions (Incomplete)
TREATMENT   Ther-ex NuStep L1-3x 5 minutes for warm-up during history with therapist monitoring fatigue and adjusting resistance, pt maintaining SPM >75;  Seated LAQ with 4# ankle weights x 1 minute;  Standing exercises with 4# ankle weights: Hip flexion marches x 1 minute; Hip abduction x 1 minute; HS curls x 1 minute; Hip extension x 1 minute;   Neuromuscular Re-education All balance exercises performed without UE support unless otherwise noted;  Airex feet together eyes open/closed x 30s each; Airex feet together eyes open with horizontal and vertical head turns x 30s each; Airex alternating 6" cone taps x 10 BLE; Gait in hallway with horizontal ball tosses with head/eye follow x 75' each direction;   Pt educated throughout session about proper posture and technique with exercises. Improved exercise technique, movement at target joints, use of target muscles after min to mod verbal, visual, tactile cues.   Pt arrived late so session is somewhat limited today. Focused on strengthening and balance todayduring session. Progressed ankle weights to 4 pounds today. Continued with balance on Airex pad. Pt denies any increase in pain during session. She becomes tearful at the end of session due to frustration related to her balance deficits. Pt encouraged tocontinue HEP andfollow-up as scheduled. Shewill benefit from PT services to address deficits in strength, balance, and mobility in order to return to full function at home.

## 2021-01-12 ENCOUNTER — Ambulatory Visit
Admission: RE | Admit: 2021-01-12 | Discharge: 2021-01-12 | Disposition: A | Discharge status: Home / Self Care | Payer: Medicare HMO | Source: Ambulatory Visit | Attending: Neurology | Admitting: Neurology

## 2021-01-12 ENCOUNTER — Other Ambulatory Visit: Payer: Self-pay

## 2021-01-12 DIAGNOSIS — R2689 Other abnormalities of gait and mobility: Secondary | ICD-10-CM | POA: Diagnosis present

## 2021-01-13 DIAGNOSIS — Z950 Presence of cardiac pacemaker: Secondary | ICD-10-CM

## 2021-01-13 HISTORY — DX: Presence of cardiac pacemaker: Z95.0

## 2021-01-14 ENCOUNTER — Ambulatory Visit: Payer: Medicare HMO

## 2021-01-18 ENCOUNTER — Ambulatory Visit: Payer: Medicare HMO

## 2021-01-18 NOTE — Patient Instructions (Incomplete)
TREATMENT   Ther-ex NuStep L1-3x 5 minutes for warm-up during historywith therapist monitoring fatigue and adjusting resistance;  Seated LAQ with4# ankle weights x 1 minute;  Standing exerciseswith 4# ankle weights: Hip flexion marches x 1 minute; Hip abduction x 1 minute; HS curls x 1 minute; Hip extension x 1 minute;  Standing heel raises x 30s; Side stepping with 4# AW x 6 lengths;   Neuromuscular Re-education All balance exercises performed without UE support unless otherwise noted;  Airex feet together eyes open/closed x 30s each; Airex feet together eyes open with horizontal and vertical head turns x 30s each; Airex balance beam tandem stance alternating forward LE x 30s each;  Seated rest breaks   Pt educated throughout session about proper posture and technique with exercises. Improved exercise technique, movement at target joints, use of target muscles after min to mod verbal, visual, tactile cues.   Pt demonstrates excellent motivation during session today.Focused on strengthening and balanceduring session. Continued with 4# ankle weights today for exercises. Continued with balance on Airex pad.Pt denies any increase in pain during session.Pt encouraged tocontinue HEP andfollow-up as scheduled. Shewill benefit from PT services to address deficits in strength, balance, and mobility in order to return to full function at home.

## 2021-01-20 ENCOUNTER — Ambulatory Visit: Payer: Medicare HMO

## 2021-01-21 ENCOUNTER — Ambulatory Visit: Payer: Medicare HMO

## 2021-02-04 ENCOUNTER — Ambulatory Visit: Payer: Medicare HMO | Attending: Otolaryngology

## 2021-02-04 ENCOUNTER — Other Ambulatory Visit: Payer: Self-pay

## 2021-02-04 DIAGNOSIS — R2681 Unsteadiness on feet: Secondary | ICD-10-CM | POA: Diagnosis present

## 2021-02-04 NOTE — Therapy (Signed)
[facility] [address]. [place], [place], [address] Phone: [phone]   Fax:  [phone]  Physical Therapy Treatment/Recertification  Patient Details  Name: Mandy Jones [facility] MRN: [id number] Date of Birth: 03-11-1951 Referring Provider (PT): Carloyn Manner   Encounter Date: 02/04/2021   PT End of Session - 02/05/21 1436     Visit Number 9    Number of Visits 33    Date for PT Re-Evaluation 04/01/21    Authorization Type eval: 10/29/10, recert: 10/02/80    PT Start Time 1625    PT Stop Time 1700    PT Time Calculation (min) 35 min    Activity Tolerance Patient tolerated treatment well    Behavior During Therapy [facility] for tasks assessed/performed             Past Medical History:  Diagnosis Date   Arrhythmia    atrial fibrillation   Arthritis    CHF (congestive heart failure) ([place])    Depression    Dyspnea    Dysrhythmia    Fibromyalgia 1992   GERD (gastroesophageal reflux disease)    Hepatitis    Hepatitis B 1985   Hyperlipidemia    Hypertension    Mitral valve prolapse    Sleep apnea    CPAP   Tendon tear, ankle    rt foot   Thyroid dysfunction     Past Surgical History:  Procedure Laterality Date   ACHILLES TENDON SURGERY Right 04/06/2018   Procedure: ACHILLES TENDON REPAIR;  Surgeon: [name], DPM;  Location: [facility];  Service: Podiatry;  Laterality: Right;   CARDIOVERSION N/A 02/06/2018   Procedure: CARDIOVERSION;  Surgeon: Corey Skains, MD;  Location: [facility];  Service: Cardiovascular;  Laterality: N/A;   CARDIOVERSION N/A 03/28/2018   Procedure: CARDIOVERSION;  Surgeon: Corey Skains, MD;  Location: [facility];  Service: Cardiovascular;  Laterality: N/A;   CARDIOVERSION N/A 05/09/2018   Procedure: CARDIOVERSION;  Surgeon: Corey Skains, MD;  Location: [facility];  Service: Cardiovascular;  Laterality: N/A;   CARDIOVERSION N/A 10/23/2019   Procedure: CARDIOVERSION;  Surgeon: Corey Skains, MD;  Location: [facility];  Service: Cardiovascular;  Laterality: N/A;   CARDIOVERSION N/A 09/30/2020   Procedure: CARDIOVERSION;  Surgeon: Corey Skains, MD;  Location: [facility];  Service: Cardiovascular;  Laterality: N/A;   CARDIOVERSION N/A 11/19/2020   Procedure: CARDIOVERSION;  Surgeon: Corey Skains, MD;  Location: [facility];  Service: Cardiovascular;  Laterality: N/A;   CHOLECYSTECTOMY     CONTINUOUS NERVE MONITORING N/A 08/03/2017   Procedure: LARYNGEAL NERVE MONITORING;  Surgeon: Carloyn Manner, MD;  Location: [facility];  Service: ENT;  Laterality: N/A;   EXTRACORPOREAL SHOCK WAVE LITHOTRIPSY Right 02/13/2020   Procedure: EXTRACORPOREAL SHOCK WAVE LITHOTRIPSY (ESWL);  Surgeon: Billey Co, MD;  Location: [facility];  Service: Urology;  Laterality: Right;   EYE SURGERY Bilateral    FOOT SURGERY Left 2000   JOINT REPLACEMENT     left knee   OSTECTOMY Right 04/06/2018   Procedure: OSTECTOMY-HAGLUNDS/RECTROCALCANEAL;  Surgeon: [name], DPM;  Location: [facility];  Service: Podiatry;  Laterality: Right;   SHOULDER ARTHROSCOPY Bilateral    SHOULDER ARTHROSCOPY Left    THYROIDECTOMY N/A 08/03/2017   Procedure: THYROIDECTOMY;  Surgeon: Carloyn Manner, MD;  Location: [facility];  Service: ENT;  Laterality: N/A;   TONSILLECTOMY     TUBAL LIGATION      There were no vitals filed for this visit.   Subjective  Assessment - 02/04/21 1628     Subjective Pt reports that she is doing alright today. She reports some improvement in her energy since her pacemaker placement. She has LUE lifting restrictions still in place until the end of the month s/p pacemaker placement. No stumbles or falls since last therapy session. No specific questions currently.    Pertinent History Pt referred to vestibular therapy by Dr. Pryor Ochoa for dizziness. She reports "my balance is off and feel wobbly." Her dizziness started after a fall in September 2021 where she hit the left side of her head and  face. She was not diagnosed with a concussion at the time of her injury. She denies any excessive fatigue, difficulty concentrating, headaches, nausea, blurred vision, emotional lability, or disrupted sleep after her fall. She does complain of bilateral tinnitus which started a couple months after the fall. She was seen by Dr. Pryor Ochoa on 10/23/20 who referred her for a VNG study. VNG was positive for abnormal smooth pursuits, saccades, and optokinetic testing. She also had 10 degrees of right beating nystagmus in the body left position and six degrees of left beating nystagmus in the body right position. Calorics were symmetrical. 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 History of cataract removal and macular degeneration. Due to central findings on VNG she was referred by [facility] to neurology but has not yet received a call to schedule. She had a R knee replacement in May 2021 and reports that she has osteoarthritis in both feet. History of CHF with fluctuating weight. She had a cardioversion two weeks ago. Possible ablation next month. 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 Pt has bilateral carpal tunnel which causes numbness in her hands. No numbness in the feet. Her primary complaint at this time is difficulty with her balance.    Limitations Walking    Diagnostic tests See history    Patient Stated Goals Improve balance so she can train and show her dogs with more confidence    Currently in Pain? No/denies    Pain Onset --                [facility] PT Assessment - 02/04/21 1639       Standardized Balance Assessment   Standardized Balance Assessment Berg Balance Test      Berg Balance Test   Sit to Stand Able to stand without using hands and stabilize independently    Standing Unsupported Able to stand safely 2 minutes    Sitting with Back Unsupported but Feet Supported on Floor  or Stool Able to sit safely and securely 2 minutes    Stand to Sit Sits safely with minimal use of hands    Transfers Able to transfer safely, minor use of hands    Standing Unsupported with Eyes Closed Able to stand 10 seconds safely    Standing Unsupported with Feet Together Able to place feet together independently and stand 1 minute safely    From Standing, Reach Forward with Outstretched Arm Can reach forward >12 cm safely (5")    From Standing Position, Pick up Object from Floor Able to pick up shoe safely and easily    From Standing Position, Turn to Look Behind Over each Shoulder Looks behind from both sides and weight shifts well    Turn 360 Degrees Able to turn 360 degrees safely in 4 seconds or less    Standing  Unsupported, Alternately Place Feet on Step/Stool Able to stand independently and safely and complete 8 steps in 20 seconds    Standing Unsupported, One Foot in Front Able to plae foot ahead of the other independently and hold 30 seconds    Standing on One Leg Able to lift leg independently and hold 5-10 seconds    Total Score 53      Functional Gait  Assessment   Gait assessed  Yes    Gait Level Surface Walks 20 ft in less than 5.5 sec, no assistive devices, good speed, no evidence for imbalance, normal gait pattern, deviates no more than 6 in outside of the 12 in walkway width.    Change in Gait Speed Able to smoothly change walking speed without loss of balance or gait deviation. Deviate no more than 6 in outside of the 12 in walkway width.    Gait with Horizontal Head Turns Performs head turns smoothly with slight change in gait velocity (eg, minor disruption to smooth gait path), deviates 6-10 in outside 12 in walkway width, or uses an assistive device.    Gait with Vertical Head Turns Performs task with slight change in gait velocity (eg, minor disruption to smooth gait path), deviates 6 - 10 in outside 12 in walkway width or uses assistive device    Gait and Pivot Turn  Pivot turns safely within 3 sec and stops quickly with no loss of balance.    Step Over Obstacle Is able to step over one shoe box (4.5 in total height) without changing gait speed. No evidence of imbalance.    Gait with Narrow Base of Support Ambulates 4-7 steps.    Gait with Eyes Closed Walks 20 ft, slow speed, abnormal gait pattern, evidence for imbalance, deviates 10-15 in outside 12 in walkway width. Requires more than 9 sec to ambulate 20 ft.    Ambulating Backwards Walks 20 ft, uses assistive device, slower speed, mild gait deviations, deviates 6-10 in outside 12 in walkway width.    Steps Two feet to a stair, must use rail.    Total Score 20                TREATMENT   Neuromuscular Re-education  Updated outcome measures with patient: ABC: 65.625% FOTO: 49 35mgait: self-selected: 13.1s = 0.76 m/s, fastest: 10.3s = 0.97 m/s BERG: 53/56; FGA: 20/30   Pt arrived late to appointment so session was somewhat abbreviated. Updated outcome measures with patient during session today. Her ABC was unchanged today and her self-selected 171mait speed decreased. However her FOTO, BERG, and FGA all improved. Overall she is making slow but steady progress toward her goals. She would benefit from additional physical therapy services to address deficits in strength and balance in order to return to full function at home.                            PT Short Term Goals - 02/05/21 1439       PT SHORT TERM GOAL #1   Title Pt will be independent with HEP in order to improve strength and balance in order to decrease fall risk and improve function at home and when training her dogs    Time 4    Period Weeks    Status On-going    Target Date 03/04/21               PT Long Term Goals -  02/05/21 1440       PT LONG TERM GOAL #1   Title Pt will improve BERG by at least 3 points in order to demonstrate clinically significant improvement in balance.    Baseline  11/30/20: To be completed at next appointment; 12/04/20: 49/56; 02/04/21: 53/56;    Time 12    Period Weeks    Status Achieved      PT LONG TERM GOAL #2   Title Pt will improve ABC by at least 13% in order to demonstrate clinically significant improvement in balance confidence.    Baseline 11/30/20: 67.5%; 02/04/21: 65.625%    Time 12    Period Weeks    Status On-going    Target Date 04/01/21      PT LONG TERM GOAL #3   Title Pt will improve FOTO to at least 55 in order to demonstrate clinically significant improvement in function related to balance and decrease her risk for falls.    Baseline 11/30/20: 46; 02/04/21: 49    Time 12    Period Weeks    Status Partially Met    Target Date 04/01/21      PT LONG TERM GOAL #4   Title Pt will demonstrate at least 4 point improvement in the FGA in order to demonstrate significant improvement in balance to decrease her fall risk at home and improve safety at home    Baseline 12/03/20:18/30; 02/04/21: 20/30;    Time 12    Period Weeks    Status Partially Met    Target Date 04/01/21      PT LONG TERM GOAL #5   Title Pt will increase self-selected 10MWT by at least 0.13 m/s in order to demonstrate clinically significant improvement in community ambulation.    Baseline 12/03/20: 11.2s = 0.22ms; 02/04/21: 0.76 m/s    Time 12    Period Weeks    Status On-going    Target Date 04/01/21                   Plan - 02/05/21 1437     Clinical Impression Statement Pt arrived late to appointment so session was somewhat abbreviated. Updated outcome measures with patient during session today. Her ABC was unchanged today and her self-selected 115mait speed decreased. However her FOTO, BERG, and FGA all improved. Overall she is making slow but steady progress toward her goals. She would benefit from additional physical therapy services to address deficits in strength and balance in order to return to full function at home.    Personal Factors and Comorbidities  Age;Comorbidity 3+    Comorbidities CHF, A-fib, depression, fibromyalgia, OA    Examination-Activity Limitations Locomotion Level;Stairs;Stand;Transfers    Examination-Participation Restrictions Community Activity;Other   Showing dogs   Stability/Clinical Decision Making Unstable/Unpredictable    Rehab Potential Good    PT Frequency 2x / week    PT Duration 8 weeks    PT Treatment/Interventions ADLs/Self Care Home Management;Aquatic Therapy;Biofeedback;Canalith Repostioning;Cryotherapy;Electrical Stimulation;Moist Heat;Iontophoresis 107m59ml Dexamethasone;Traction;Ultrasound;Contrast Bath;DME Instruction;Gait training;Stair training;Functional mobility training;Therapeutic activities;Therapeutic exercise;Balance training;Neuromuscular re-education;Patient/family education;Manual techniques;Passive range of motion;Dry needling;Vestibular;Spinal Manipulations;Joint Manipulations    PT Next Visit Plan Progress note, Progress balance and strength    PT Home Exercise Plan Access Code: [email or web address] Consulted and Agree with Plan of Care Patient             Patient will benefit from skilled therapeutic intervention in order to improve the following deficits and impairments:  Decreased balance, Difficulty walking  Visit  Diagnosis: Unsteadiness on feet - Plan: PT plan of care cert/re-cert     Problem List Patient Active Problem List   Diagnosis Date Noted   Acute diastolic heart failure ([place]) 02/10/2020   Acute respiratory failure with hypoxia ([place]) 02/09/2020   Hydronephrosis with renal and ureteral calculus obstruction 02/09/2020   Acute pyelonephritis 34/19/6222   Acute diastolic CHF (congestive heart failure) ([place]) 02/09/2020   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/09/2020   AF (paroxysmal atrial fibrillation) ([place]) s/p ablation 02/09/2020   Post-op pain 04/08/2018   Post-operative pain 04/06/2018   Atrial fibrillation with RVR ([place]) 12/14/2017   Hypothyroidism 12/14/2017   Valvular heart  disease 12/14/2017   Muscle cramps 12/14/2017   S/P complete thyroidectomy 08/03/2017   Lyndel Safe Adrian Specht PT, DPT, GCS  Mandy Jones 02/05/2021, 2:57 PM  [facility] [address] [place], [place], [address] Phone: [phone]   Fax:  [phone]  Name: Mandy Jones [facility] MRN: [id number] Date of Birth: [birth year]

## 2021-02-16 ENCOUNTER — Other Ambulatory Visit: Payer: Self-pay

## 2021-02-16 ENCOUNTER — Ambulatory Visit: Payer: Medicare HMO

## 2021-02-16 VITALS — BP 113/92 | HR 100

## 2021-02-16 DIAGNOSIS — R2681 Unsteadiness on feet: Secondary | ICD-10-CM

## 2021-02-16 NOTE — Therapy (Signed)
[facility] [address]. [place], [place], [address] Phone: [phone]   Fax:  [phone]  Physical Therapy Progress Note  Dates of reporting period  11/30/20   to   02/16/21  Patient Details  Name: Mandy Jones [facility] MRN: [id number] Date of Birth: [birth year] Referring Provider (PT): Carloyn Manner   Encounter Date: 02/16/2021   PT End of Session - 02/16/21 1555     Visit Number 10    Number of Visits 33    Date for PT Re-Evaluation 04/01/21    Authorization Type eval: 02/04/78, recert: 12/05/99    PT Start Time 6553    PT Stop Time 7482    PT Time Calculation (min) 30 min    Activity Tolerance Patient tolerated treatment well    Behavior During Therapy [facility] for tasks assessed/performed             Past Medical History:  Diagnosis Date   Arrhythmia    atrial fibrillation   Arthritis    CHF (congestive heart failure) ([place])    Depression    Dyspnea    Dysrhythmia    Fibromyalgia 1992   GERD (gastroesophageal reflux disease)    Hepatitis    Hepatitis B 1985   Hyperlipidemia    Hypertension    Mitral valve prolapse    Sleep apnea    CPAP   Tendon tear, ankle    rt foot   Thyroid dysfunction     Past Surgical History:  Procedure Laterality Date   ACHILLES TENDON SURGERY Right 04/06/2018   Procedure: ACHILLES TENDON REPAIR;  Surgeon: [name], DPM;  Location: [facility];  Service: Podiatry;  Laterality: Right;   CARDIOVERSION N/A 02/06/2018   Procedure: CARDIOVERSION;  Surgeon: Corey Skains, MD;  Location: [facility];  Service: Cardiovascular;  Laterality: N/A;   CARDIOVERSION N/A 03/28/2018   Procedure: CARDIOVERSION;  Surgeon: Corey Skains, MD;  Location: [facility];  Service: Cardiovascular;  Laterality: N/A;   CARDIOVERSION N/A 05/09/2018   Procedure: CARDIOVERSION;  Surgeon: Corey Skains, MD;  Location: [facility];  Service: Cardiovascular;  Laterality: N/A;   CARDIOVERSION N/A 10/23/2019    Procedure: CARDIOVERSION;  Surgeon: Corey Skains, MD;  Location: [facility];  Service: Cardiovascular;  Laterality: N/A;   CARDIOVERSION N/A 09/30/2020   Procedure: CARDIOVERSION;  Surgeon: Corey Skains, MD;  Location: [facility];  Service: Cardiovascular;  Laterality: N/A;   CARDIOVERSION N/A 11/19/2020   Procedure: CARDIOVERSION;  Surgeon: Corey Skains, MD;  Location: [facility];  Service: Cardiovascular;  Laterality: N/A;   CHOLECYSTECTOMY     CONTINUOUS NERVE MONITORING N/A 08/03/2017   Procedure: LARYNGEAL NERVE MONITORING;  Surgeon: Carloyn Manner, MD;  Location: [facility];  Service: ENT;  Laterality: N/A;   EXTRACORPOREAL SHOCK WAVE LITHOTRIPSY Right 02/13/2020   Procedure: EXTRACORPOREAL SHOCK WAVE LITHOTRIPSY (ESWL);  Surgeon: Billey Co, MD;  Location: [facility];  Service: Urology;  Laterality: Right;   EYE SURGERY Bilateral    FOOT SURGERY Left 2000   JOINT REPLACEMENT     left knee   OSTECTOMY Right 04/06/2018   Procedure: OSTECTOMY-HAGLUNDS/RECTROCALCANEAL;  Surgeon: [name], DPM;  Location: [facility];  Service: Podiatry;  Laterality: Right;   SHOULDER ARTHROSCOPY Bilateral    SHOULDER ARTHROSCOPY Left    THYROIDECTOMY N/A 08/03/2017   Procedure: THYROIDECTOMY;  Surgeon: Carloyn Manner, MD;  Location: [facility];  Service: ENT;  Laterality: N/A;   TONSILLECTOMY     TUBAL LIGATION  Vitals:   02/16/21 1548  BP: (!) 113/92  Pulse: 100  SpO2: 98%     Subjective Assessment - 02/17/21 1114     Subjective Pt reports that she is doing alright today. She reports some improvement in her energy since her pacemaker placement but continues to notice staggering and issues with her balance. However no falls since last therapy session. She has a follow-up appointment scheduled with her cardiology on Friday. No specific questions currently.    Pertinent History Pt referred to vestibular therapy by Dr. Pryor Ochoa for dizziness. She reports "my balance is off and feel  wobbly." Her dizziness started after a fall in September 2021 where she hit the left side of her head and face. She was not diagnosed with a concussion at the time of her injury. She denies any excessive fatigue, difficulty concentrating, headaches, nausea, blurred vision, emotional lability, or disrupted sleep after her fall. She does complain of bilateral tinnitus which started a couple months after the fall. She was seen by Dr. Pryor Ochoa on 10/23/20 who referred her for a VNG study. VNG was positive for abnormal smooth pursuits, saccades, and optokinetic testing. She also had 10 degrees of right beating nystagmus in the body left position and six degrees of left beating nystagmus in the body right position. Calorics were symmetrical. 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 History of cataract removal and macular degeneration. Due to central findings on VNG she was referred by [facility] to neurology but has not yet received a call to schedule. She had a R knee replacement in May 2021 and reports that she has osteoarthritis in both feet. History of CHF with fluctuating weight. She had a cardioversion two weeks ago. Possible ablation next month. 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 Pt has bilateral carpal tunnel which causes numbness in her hands. No numbness in the feet. Her primary complaint at this time is difficulty with her balance.    Limitations Walking    Diagnostic tests See history    Patient Stated Goals Improve balance so she can train and show her dogs with more confidence    Currently in Pain? No/denies                 TREATMENT     Neuromuscular Re-education  Vitals obtained and discussed concerns regarding persistently elevated heart rate with patient, also discussed her concerns regarding getting MRI approved.   All balance exercises performed without UE support  unless otherwise noted;   Firm tandem balance alternating forward LE 2 x 30s each; Firm feet apart eyes open/closed x 30s each; Firm feet together eyes open/closed x 30s each; Airex feet apart eyes open/closed x 30s each; Airex feet together eyes open/closed x 30s each; Airex feet together eyes open with horizontal and vertical head turns x 30s each; Airex alternating 6" step taps x 10 each; Airex alternating 6" cone taps x 10 each Seated rest breaks throughout session;     Pt educated throughout session about proper posture and technique with exercises. Improved exercise technique, movement at target joints, use of target muscles after min to mod verbal, visual, tactile cues.      Pt arrived late to appointment so session was somewhat abbreviated. Updated outcome measures with patient during 02/04/21 appointment so not updated again today. Her ABC was unchanged at that time and her self-selected 15mgait speed decreased. However her  FOTO, BERG, and FGA all improved. Overall she is making slow but steady progress toward her goals. She continues to report staggering and imbalance during gait. Focused on balance exercises during session today. She would benefit from additional physical therapy services to address deficits in strength and balance in order to return to full function at home.                         PT Short Term Goals - 02/17/21 1122       PT SHORT TERM GOAL #1   Title Pt will be independent with HEP in order to improve strength and balance in order to decrease fall risk and improve function at home and when training her dogs    Time 4    Period Weeks    Status On-going    Target Date 03/04/21               PT Long Term Goals - 02/17/21 1122       PT LONG TERM GOAL #1   Title Pt will improve BERG by at least 3 points in order to demonstrate clinically significant improvement in balance.    Baseline 11/30/20: To be completed at next appointment;  12/04/20: 49/56; 02/04/21: 53/56;    Time 12    Period Weeks    Status Achieved      PT LONG TERM GOAL #2   Title Pt will improve ABC by at least 13% in order to demonstrate clinically significant improvement in balance confidence.    Baseline 11/30/20: 67.5%; 02/04/21: 65.625%    Time 12    Period Weeks    Status On-going    Target Date 04/01/21      PT LONG TERM GOAL #3   Title Pt will improve FOTO to at least 55 in order to demonstrate clinically significant improvement in function related to balance and decrease her risk for falls.    Baseline 11/30/20: 46; 02/04/21: 49    Time 12    Period Weeks    Status Partially Met    Target Date 04/01/21      PT LONG TERM GOAL #4   Title Pt will demonstrate at least 4 point improvement in the FGA in order to demonstrate significant improvement in balance to decrease her fall risk at home and improve safety at home    Baseline 12/03/20:18/30; 02/04/21: 20/30;    Time 12    Period Weeks    Status Partially Met    Target Date 04/01/21      PT LONG TERM GOAL #5   Title Pt will increase self-selected 10MWT by at least 0.13 m/s in order to demonstrate clinically significant improvement in community ambulation.    Baseline 12/03/20: 11.2s = 0.64ms; 02/04/21: 0.76 m/s    Time 12    Period Weeks    Status On-going    Target Date 04/01/21                   Plan - 02/17/21 1115     Clinical Impression Statement Pt arrived late to appointment so session was somewhat abbreviated. Updated outcome measures with patient during 02/04/21 appointment so not updated again today. Her ABC was unchanged at that time and her self-selected 120mait speed decreased. However her FOTO, BERG, and FGA all improved. Overall she is making slow but steady progress toward her goals. She continues to report staggering and imbalance during gait. Focused on balance  exercises during session today. She would benefit from additional physical therapy services to address deficits in  strength and balance in order to return to full function at home.    Personal Factors and Comorbidities Age;Comorbidity 3+    Comorbidities CHF, A-fib, depression, fibromyalgia, OA    Examination-Activity Limitations Locomotion Level;Stairs;Stand;Transfers    Examination-Participation Restrictions Community Activity;Other   Showing dogs   Stability/Clinical Decision Making Unstable/Unpredictable    Rehab Potential Good    PT Frequency 2x / week    PT Duration 8 weeks    PT Treatment/Interventions ADLs/Self Care Home Management;Aquatic Therapy;Biofeedback;Canalith Repostioning;Cryotherapy;Electrical Stimulation;Moist Heat;Iontophoresis 26m/ml Dexamethasone;Traction;Ultrasound;Contrast Bath;DME Instruction;Gait training;Stair training;Functional mobility training;Therapeutic activities;Therapeutic exercise;Balance training;Neuromuscular re-education;Patient/family education;Manual techniques;Passive range of motion;Dry needling;Vestibular;Spinal Manipulations;Joint Manipulations    PT Next Visit Plan Progress note, Progress balance and strength    PT Home Exercise Plan Access Code: [email or web address]   Consulted and Agree with Plan of Care Patient             Patient will benefit from skilled therapeutic intervention in order to improve the following deficits and impairments:  Decreased balance, Difficulty walking  Visit Diagnosis: Unsteadiness on feet     Problem List Patient Active Problem List   Diagnosis Date Noted   Acute diastolic heart failure ([facility] 02/10/2020   Acute respiratory failure with hypoxia ([facility] 02/09/2020   Hydronephrosis with renal and ureteral calculus obstruction 02/09/2020   Acute pyelonephritis 018/28/8337  Acute diastolic CHF (congestive heart failure) ([facility] 02/09/2020   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/09/2020   AF (paroxysmal atrial fibrillation) ([place] s/p ablation 02/09/2020   Post-op pain 04/08/2018   Post-operative pain 04/06/2018   Atrial fibrillation with  RVR ([facility] 12/14/2017   Hypothyroidism 12/14/2017   Valvular heart disease 12/14/2017   Muscle cramps 12/14/2017   S/P complete thyroidectomy 08/03/2017   JLyndel SafeHuprich PT, DPT, GCS  Conny Situ 02/17/2021, 11:27 AM  [place] [facility] [place] [address]Phone: [phone]  Fax:  [phone] Name: Mandy BorysMMesa Az Endoscopy Asc LLCMRN: [id number] of Birth: 103-22-1952

## 2021-02-22 ENCOUNTER — Emergency Department: Payer: Medicare HMO

## 2021-02-22 ENCOUNTER — Encounter: Payer: Self-pay | Admitting: Emergency Medicine

## 2021-02-22 ENCOUNTER — Other Ambulatory Visit: Payer: Self-pay

## 2021-02-22 ENCOUNTER — Ambulatory Visit: Payer: Medicare HMO

## 2021-02-22 ENCOUNTER — Ambulatory Visit
Admission: EM | Admit: 2021-02-22 | Discharge: 2021-02-22 | Disposition: A | Discharge status: Home / Self Care | Payer: Medicare HMO | Attending: Family Medicine | Admitting: Family Medicine

## 2021-02-22 DIAGNOSIS — J189 Pneumonia, unspecified organism: Secondary | ICD-10-CM | POA: Diagnosis not present

## 2021-02-22 DIAGNOSIS — M797 Fibromyalgia: Secondary | ICD-10-CM | POA: Diagnosis present

## 2021-02-22 DIAGNOSIS — Z6841 Body Mass Index (BMI) 40.0 and over, adult: Secondary | ICD-10-CM

## 2021-02-22 DIAGNOSIS — Z7901 Long term (current) use of anticoagulants: Secondary | ICD-10-CM

## 2021-02-22 DIAGNOSIS — E876 Hypokalemia: Secondary | ICD-10-CM | POA: Diagnosis present

## 2021-02-22 DIAGNOSIS — R590 Localized enlarged lymph nodes: Secondary | ICD-10-CM | POA: Diagnosis present

## 2021-02-22 DIAGNOSIS — Z9049 Acquired absence of other specified parts of digestive tract: Secondary | ICD-10-CM

## 2021-02-22 DIAGNOSIS — I110 Hypertensive heart disease with heart failure: Secondary | ICD-10-CM | POA: Diagnosis present

## 2021-02-22 DIAGNOSIS — I5032 Chronic diastolic (congestive) heart failure: Secondary | ICD-10-CM | POA: Diagnosis present

## 2021-02-22 DIAGNOSIS — I2510 Atherosclerotic heart disease of native coronary artery without angina pectoris: Secondary | ICD-10-CM | POA: Diagnosis present

## 2021-02-22 DIAGNOSIS — F32A Depression, unspecified: Secondary | ICD-10-CM | POA: Diagnosis present

## 2021-02-22 DIAGNOSIS — Z20822 Contact with and (suspected) exposure to covid-19: Secondary | ICD-10-CM | POA: Diagnosis present

## 2021-02-22 DIAGNOSIS — Z800 Family history of malignant neoplasm of digestive organs: Secondary | ICD-10-CM

## 2021-02-22 DIAGNOSIS — Z888 Allergy status to other drugs, medicaments and biological substances status: Secondary | ICD-10-CM

## 2021-02-22 DIAGNOSIS — R509 Fever, unspecified: Secondary | ICD-10-CM

## 2021-02-22 DIAGNOSIS — Z79890 Hormone replacement therapy (postmenopausal): Secondary | ICD-10-CM

## 2021-02-22 DIAGNOSIS — I341 Nonrheumatic mitral (valve) prolapse: Secondary | ICD-10-CM | POA: Diagnosis present

## 2021-02-22 DIAGNOSIS — T502X5A Adverse effect of carbonic-anhydrase inhibitors, benzothiadiazides and other diuretics, initial encounter: Secondary | ICD-10-CM | POA: Diagnosis present

## 2021-02-22 DIAGNOSIS — Z8249 Family history of ischemic heart disease and other diseases of the circulatory system: Secondary | ICD-10-CM

## 2021-02-22 DIAGNOSIS — Z87891 Personal history of nicotine dependence: Secondary | ICD-10-CM

## 2021-02-22 DIAGNOSIS — R0602 Shortness of breath: Secondary | ICD-10-CM | POA: Diagnosis not present

## 2021-02-22 DIAGNOSIS — Z950 Presence of cardiac pacemaker: Secondary | ICD-10-CM

## 2021-02-22 DIAGNOSIS — I2721 Secondary pulmonary arterial hypertension: Secondary | ICD-10-CM | POA: Diagnosis present

## 2021-02-22 DIAGNOSIS — R079 Chest pain, unspecified: Secondary | ICD-10-CM | POA: Diagnosis not present

## 2021-02-22 DIAGNOSIS — E785 Hyperlipidemia, unspecified: Secondary | ICD-10-CM | POA: Diagnosis present

## 2021-02-22 DIAGNOSIS — E890 Postprocedural hypothyroidism: Secondary | ICD-10-CM | POA: Diagnosis present

## 2021-02-22 DIAGNOSIS — I4891 Unspecified atrial fibrillation: Secondary | ICD-10-CM | POA: Diagnosis present

## 2021-02-22 DIAGNOSIS — I700 Atherosclerosis of aorta: Secondary | ICD-10-CM | POA: Diagnosis present

## 2021-02-22 DIAGNOSIS — K219 Gastro-esophageal reflux disease without esophagitis: Secondary | ICD-10-CM | POA: Diagnosis present

## 2021-02-22 DIAGNOSIS — Z79899 Other long term (current) drug therapy: Secondary | ICD-10-CM

## 2021-02-22 DIAGNOSIS — I493 Ventricular premature depolarization: Secondary | ICD-10-CM | POA: Diagnosis present

## 2021-02-22 LAB — CBC
HCT: 39.7 % (ref 36.0–46.0)
Hemoglobin: 13.1 g/dL (ref 12.0–15.0)
MCH: 29.1 pg (ref 26.0–34.0)
MCHC: 33 g/dL (ref 30.0–36.0)
MCV: 88.2 fL (ref 80.0–100.0)
Platelets: 256 K/uL (ref 150–400)
RBC: 4.5 MIL/uL (ref 3.87–5.11)
RDW: 13.7 % (ref 11.5–15.5)
WBC: 8.1 K/uL (ref 4.0–10.5)
nRBC: 0 % (ref 0.0–0.2)

## 2021-02-22 LAB — COMPREHENSIVE METABOLIC PANEL WITH GFR
ALT: 23 U/L (ref 0–44)
AST: 17 U/L (ref 15–41)
Albumin: 3.7 g/dL (ref 3.5–5.0)
Alkaline Phosphatase: 110 U/L (ref 38–126)
Anion gap: 8 (ref 5–15)
BUN: 15 mg/dL (ref 8–23)
CO2: 26 mmol/L (ref 22–32)
Calcium: 8.8 mg/dL — ABNORMAL LOW (ref 8.9–10.3)
Chloride: 103 mmol/L (ref 98–111)
Creatinine, Ser: 0.54 mg/dL (ref 0.44–1.00)
GFR, Estimated: >60 mL/min (ref >60)
Glucose, Bld: 124 mg/dL — ABNORMAL HIGH (ref 70–99)
Potassium: 3.2 mmol/L — ABNORMAL LOW (ref 3.5–5.1)
Sodium: 137 mmol/L (ref 135–145)
Total Bilirubin: 0.8 mg/dL (ref 0.3–1.2)
Total Protein: 7 g/dL (ref 6.5–8.1)

## 2021-02-22 LAB — TROPONIN I (HIGH SENSITIVITY): Troponin I (High Sensitivity): 5 ng/L (ref <18)

## 2021-02-22 LAB — BRAIN NATRIURETIC PEPTIDE: B Natriuretic Peptide: 108.8 pg/mL — ABNORMAL HIGH (ref 0.0–100.0)

## 2021-02-22 LAB — LACTIC ACID, PLASMA: Lactic Acid, Venous: 1 mmol/L (ref 0.5–1.9)

## 2021-02-22 NOTE — Discharge Instructions (Addendum)
Please go directly to the ER.  You need labs, imaging, and monitoring.  Best of luck  Dr. Lacinda Axon

## 2021-02-22 NOTE — ED Notes (Signed)
Patient is being discharged from the Urgent Care and sent to the Emergency Department via POV . Per Dr. Lacinda Axon, patient is in need of higher level of care due to Tachypnea, Urinary Retention, fever. Patient is aware and verbalizes understanding of plan of care.  Vitals:   02/22/21 1759  BP: 116/65  Pulse: 93  Resp: (!) 24  Temp: 98.9 F (37.2 C)  SpO2: 97%

## 2021-02-22 NOTE — ED Provider Notes (Addendum)
[facility]    CSN: [id number] Arrival date & time: 02/22/21  1724      History   Chief Complaint Chief Complaint  Patient presents with   Fever   Shortness of Breath   Cough   HPI 70 year old female presents with the above complaints.  Patient states that she has been sick since last Tuesday.  She has had ongoing cough, shortness of breath, and fever.  She states that she had some central chest pain this morning which lasted 30 minutes and then resolved.  She has recently had a pacemaker placement.  Home testing for COVID negative today.  Patient states that she is concerned about infection.  She states that she is not drinking very much.  She has not urinating as she normally does after her Lasix.  Symptoms seem to be worse when she lies down as it resolves and cough.  She has no known CHF.  She states that her last fever was 100.6 this morning.  No relieving factors.  Past Medical History:  Diagnosis Date   Arrhythmia    atrial fibrillation   Arthritis    CHF (congestive heart failure) ([facility])    Depression    Dyspnea    Dysrhythmia    Fibromyalgia 1992   GERD (gastroesophageal reflux disease)    Hepatitis    Hepatitis B 1985   Hyperlipidemia    Hypertension    Mitral valve prolapse    Pacemaker    Sleep apnea    CPAP   Tendon tear, ankle    rt foot   Thyroid dysfunction     Patient Active Problem List   Diagnosis Date Noted   Acute diastolic heart failure ([place]) 02/10/2020   Acute respiratory failure with hypoxia ([place]) 02/09/2020   Hydronephrosis with renal and ureteral calculus obstruction 02/09/2020   Acute pyelonephritis 33/00/7622   Acute diastolic CHF (congestive heart failure) ([place]) 02/09/2020   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/09/2020   AF (paroxysmal atrial fibrillation) ([facility]) s/p ablation 02/09/2020   Post-op pain 04/08/2018   Post-operative pain 04/06/2018   Atrial fibrillation with RVR ([place]) 12/14/2017   Hypothyroidism 12/14/2017    Valvular heart disease 12/14/2017   Muscle cramps 12/14/2017   S/P complete thyroidectomy 08/03/2017    Past Surgical History:  Procedure Laterality Date   ACHILLES TENDON SURGERY Right 04/06/2018   Procedure: ACHILLES TENDON REPAIR;  Surgeon: [name], DPM;  Location: [facility];  Service: Podiatry;  Laterality: Right;   CARDIOVERSION N/A 02/06/2018   Procedure: CARDIOVERSION;  Surgeon: Corey Skains, MD;  Location: [facility];  Service: Cardiovascular;  Laterality: N/A;   CARDIOVERSION N/A 03/28/2018   Procedure: CARDIOVERSION;  Surgeon: Corey Skains, MD;  Location: [facility];  Service: Cardiovascular;  Laterality: N/A;   CARDIOVERSION N/A 05/09/2018   Procedure: CARDIOVERSION;  Surgeon: Corey Skains, MD;  Location: [facility];  Service: Cardiovascular;  Laterality: N/A;   CARDIOVERSION N/A 10/23/2019   Procedure: CARDIOVERSION;  Surgeon: Corey Skains, MD;  Location: [facility];  Service: Cardiovascular;  Laterality: N/A;   CARDIOVERSION N/A 09/30/2020   Procedure: CARDIOVERSION;  Surgeon: Corey Skains, MD;  Location: [facility];  Service: Cardiovascular;  Laterality: N/A;   CARDIOVERSION N/A 11/19/2020   Procedure: CARDIOVERSION;  Surgeon: Corey Skains, MD;  Location: [facility];  Service: Cardiovascular;  Laterality: N/A;   CHOLECYSTECTOMY     CONTINUOUS NERVE MONITORING N/A 08/03/2017   Procedure: LARYNGEAL NERVE MONITORING;  Surgeon: Carloyn Manner, MD;  Location: [facility];  Service: ENT;  Laterality: N/A;   EXTRACORPOREAL SHOCK WAVE LITHOTRIPSY Right 02/13/2020   Procedure: EXTRACORPOREAL SHOCK WAVE LITHOTRIPSY (ESWL);  Surgeon: Billey Co, MD;  Location: [facility];  Service: Urology;  Laterality: Right;   EYE SURGERY Bilateral    FOOT SURGERY Left 2000   JOINT REPLACEMENT     left knee   OSTECTOMY Right 04/06/2018   Procedure: OSTECTOMY-HAGLUNDS/RECTROCALCANEAL;  Surgeon: [name], DPM;  Location: [facility];  Service: Podiatry;  Laterality: Right;    SHOULDER ARTHROSCOPY Bilateral    SHOULDER ARTHROSCOPY Left    THYROIDECTOMY N/A 08/03/2017   Procedure: THYROIDECTOMY;  Surgeon: Carloyn Manner, MD;  Location: [facility];  Service: ENT;  Laterality: N/A;   TONSILLECTOMY     TUBAL LIGATION      OB History   No obstetric history on file.      Home Medications    Prior to Admission medications   Medication Sig Start Date End Date Taking? Authorizing Provider  albuterol (VENTOLIN HFA) 108 (90 Base) MCG/ACT inhaler Inhale 1-2 puffs into the lungs every 6 (six) hours as needed for wheezing or shortness of breath.    [provider]  amiodarone (PACERONE) 200 MG tablet Take 1 tablet (200 mg total) by mouth daily. 11/19/20   Corey Skains, MD  apixaban (ELIQUIS) 5 MG TABS tablet Take 1 tablet (5 mg total) by mouth 2 (two) times daily. 02/14/20   Val Riles, MD  Ascorbic Acid (VITAMIN C) 1000 MG tablet Take 1,000 mg by mouth every evening.    [provider]  B Complex-C (B-COMPLEX WITH VITAMIN C) tablet Take 1 tablet by mouth daily.    [provider]  Boswellia-Glucosamine-Vit D (OSTEO BI-FLEX ONE PER DAY) TABS Take 1 tablet by mouth daily.    [provider]  Calcium-Magnesium-Vitamin D (CALCIUM MAGNESIUM PO) Take 1 tablet by mouth daily.    [provider]  Cholecalciferol (VITAMIN D3) 50 MCG (2000 UT) TABS Take 2,000 Units by mouth at bedtime.    [provider]  cycloSPORINE (RESTASIS) 0.05 % ophthalmic emulsion Place 1 drop into both eyes 2 (two) times daily.    [provider]  famotidine (PEPCID) 20 MG tablet Take 40 mg by mouth at bedtime. 10/09/19   [provider]  furosemide (LASIX) 40 MG tablet Take 1 tablet (40 mg total) by mouth 2 (two) times daily. Patient taking differently: Take 80 mg by mouth daily. 02/11/20   Val Riles, MD  levothyroxine (SYNTHROID) 150 MCG tablet Take 150 mcg by mouth every other day. 10/15/19   [provider]   levothyroxine (SYNTHROID) 175 MCG tablet Take 175 mcg by mouth every other day. 10/09/19   [provider]  Multiple Vitamins-Minerals (LUTEIN-ZEAXANTHIN) TABS Take 1 tablet by mouth in the morning and at bedtime.    [provider]  potassium chloride (KLOR-CON) 10 MEQ tablet Take 10 mEq by mouth 2 (two) times daily. 09/12/20   [provider]  Red Yeast Rice Extract 600 MG CAPS Take 600 mg by mouth in the morning and at bedtime.    [provider]  sacubitril-valsartan (ENTRESTO) 24-26 MG Take 1 tablet by mouth 2 (two) times daily. Needs appt for further refills or request goes to [facility] 11/14/20   Darylene Price A, FNP  traZODone (DESYREL) 50 MG tablet Take 50 mg by mouth at bedtime as needed for sleep. 12/04/17   [provider]    Social History Social History   Tobacco  Use   Smoking status: Former    Pack years: 0.00   Smokeless tobacco: Never   Tobacco comments:    quit 30 years ago  Vaping Use   Vaping Use: Never used  Substance Use Topics   Alcohol use: Yes    Comment: occassional   Drug use: Not Currently    Types: Marijuana     Allergies   Tape   Review of Systems Review of Systems Per HPI  Physical Exam Triage Vital Signs ED Triage Vitals  Enc Vitals Group     BP 02/22/21 1759 116/65     Pulse Rate 02/22/21 1759 93     Resp 02/22/21 1759 (!) 24     Temp 02/22/21 1759 98.9 F (37.2 C)     Temp Source 02/22/21 1759 Oral     SpO2 02/22/21 1759 97 %     Weight --      Height --      Head Circumference --      Peak Flow --      Pain Score 02/22/21 1834 0     Pain Loc --      Pain Edu? --      Excl. in [place]? --    Updated Vital Signs BP 116/65 (BP Location: Left Arm)   Pulse 93   Temp 98.9 F (37.2 C) (Oral)   Resp (!) 24   SpO2 97%   Visual Acuity Right Eye Distance:   Left Eye Distance:   Bilateral Distance:    Right Eye Near:   Left Eye Near:    Bilateral Near:     Physical Exam Vitals and  nursing note reviewed.  Constitutional:      Comments: Appears tachypneic.  HENT:     Head: Normocephalic and atraumatic.  Eyes:     General:        Right eye: No discharge.        Left eye: No discharge.     Conjunctiva/sclera: Conjunctivae normal.  Cardiovascular:     Rate and Rhythm: Normal rate and regular rhythm.  Pulmonary:     Breath sounds: No wheezing.     Comments: Tachypneic.  No appreciable crackles on exam. Neurological:     Mental Status: She is alert.  Psychiatric:        Mood and Affect: Mood normal.        Behavior: Behavior normal.     UC Treatments / Results  Labs (all labs ordered are listed, but only abnormal results are displayed) Labs Reviewed - No data to display  EKG   Radiology No results found.  Procedures Procedures (including critical care time)  Medications Ordered in UC Medications - No data to display  Initial Impression / Assessment and Plan / UC Course  I have reviewed the triage vital signs and the nursing notes.  Pertinent labs & imaging results that were available during my care of the patient were reviewed by me and considered in my medical decision making (see chart for details).    70 year old female with multiple comorbidities presents with fever, cough, and shortness of breath.  Patient tachypneic on exam.  I advised the patient that she needs to go to the hospital for further evaluation and management.  Patient needs labs, imaging, and monitoring.  I do not have access to lab after 5:00.  Patient declined EMS transport.  She is going directly to the hospital at [facility].  May need hospital admission pending work-up.  ED  nurse [place] notified.  Final Clinical Impressions(s) / UC Diagnoses   Final diagnoses:  SOB (shortness of breath)  Fever, unspecified fever cause     Discharge Instructions      Please go directly to the ER.  You need labs, imaging, and monitoring.  Best of luck  Dr. Lacinda Axon    ED Prescriptions    None    PDMP not reviewed this encounter.    Coral Spikes, [place] 02/22/21 [phone]

## 2021-02-22 NOTE — ED Triage Notes (Signed)
Pt in with co cough and fever since last week, states as high as 102.6 at home. States today started having chest pain and some shob.

## 2021-02-22 NOTE — ED Triage Notes (Signed)
Pt presents today with c/o SOB, cough and fever x 1 week. She also reports waking up at 5am with chest pain described as a tight band across chest that relieved in 30 min.   She reports having a pacemaker placed 5 weeks ago.   Tested negative for Covid on 6/25 with home test.

## 2021-02-23 ENCOUNTER — Encounter: Payer: Self-pay | Admitting: Internal Medicine

## 2021-02-23 ENCOUNTER — Emergency Department: Payer: Medicare HMO

## 2021-02-23 ENCOUNTER — Ambulatory Visit: Payer: Medicare HMO

## 2021-02-23 ENCOUNTER — Inpatient Hospital Stay
Admission: EM | Admit: 2021-02-23 | Discharge: 2021-02-25 | DRG: 194 | Disposition: A | Discharge status: Home Health | Payer: Medicare HMO | Attending: Internal Medicine | Admitting: Internal Medicine

## 2021-02-23 DIAGNOSIS — Z6841 Body Mass Index (BMI) 40.0 and over, adult: Secondary | ICD-10-CM | POA: Diagnosis not present

## 2021-02-23 DIAGNOSIS — Z8249 Family history of ischemic heart disease and other diseases of the circulatory system: Secondary | ICD-10-CM | POA: Diagnosis not present

## 2021-02-23 DIAGNOSIS — E039 Hypothyroidism, unspecified: Secondary | ICD-10-CM | POA: Diagnosis present

## 2021-02-23 DIAGNOSIS — R059 Cough, unspecified: Secondary | ICD-10-CM

## 2021-02-23 DIAGNOSIS — E876 Hypokalemia: Secondary | ICD-10-CM | POA: Diagnosis not present

## 2021-02-23 DIAGNOSIS — M797 Fibromyalgia: Secondary | ICD-10-CM | POA: Diagnosis present

## 2021-02-23 DIAGNOSIS — I700 Atherosclerosis of aorta: Secondary | ICD-10-CM | POA: Diagnosis present

## 2021-02-23 DIAGNOSIS — Z800 Family history of malignant neoplasm of digestive organs: Secondary | ICD-10-CM | POA: Diagnosis not present

## 2021-02-23 DIAGNOSIS — I2721 Secondary pulmonary arterial hypertension: Secondary | ICD-10-CM | POA: Diagnosis present

## 2021-02-23 DIAGNOSIS — I509 Heart failure, unspecified: Secondary | ICD-10-CM

## 2021-02-23 DIAGNOSIS — I110 Hypertensive heart disease with heart failure: Secondary | ICD-10-CM | POA: Diagnosis present

## 2021-02-23 DIAGNOSIS — Z20822 Contact with and (suspected) exposure to covid-19: Secondary | ICD-10-CM | POA: Diagnosis present

## 2021-02-23 DIAGNOSIS — Z9049 Acquired absence of other specified parts of digestive tract: Secondary | ICD-10-CM | POA: Diagnosis not present

## 2021-02-23 DIAGNOSIS — I2510 Atherosclerotic heart disease of native coronary artery without angina pectoris: Secondary | ICD-10-CM | POA: Diagnosis present

## 2021-02-23 DIAGNOSIS — I5032 Chronic diastolic (congestive) heart failure: Secondary | ICD-10-CM | POA: Diagnosis present

## 2021-02-23 DIAGNOSIS — Z888 Allergy status to other drugs, medicaments and biological substances status: Secondary | ICD-10-CM | POA: Diagnosis not present

## 2021-02-23 DIAGNOSIS — E785 Hyperlipidemia, unspecified: Secondary | ICD-10-CM | POA: Diagnosis present

## 2021-02-23 DIAGNOSIS — Z950 Presence of cardiac pacemaker: Secondary | ICD-10-CM | POA: Diagnosis not present

## 2021-02-23 DIAGNOSIS — J189 Pneumonia, unspecified organism: Secondary | ICD-10-CM | POA: Diagnosis present

## 2021-02-23 DIAGNOSIS — K219 Gastro-esophageal reflux disease without esophagitis: Secondary | ICD-10-CM | POA: Diagnosis present

## 2021-02-23 DIAGNOSIS — E890 Postprocedural hypothyroidism: Secondary | ICD-10-CM | POA: Diagnosis present

## 2021-02-23 DIAGNOSIS — R079 Chest pain, unspecified: Secondary | ICD-10-CM | POA: Diagnosis present

## 2021-02-23 DIAGNOSIS — I493 Ventricular premature depolarization: Secondary | ICD-10-CM | POA: Diagnosis present

## 2021-02-23 DIAGNOSIS — I4891 Unspecified atrial fibrillation: Secondary | ICD-10-CM | POA: Diagnosis present

## 2021-02-23 DIAGNOSIS — Z87891 Personal history of nicotine dependence: Secondary | ICD-10-CM | POA: Diagnosis not present

## 2021-02-23 DIAGNOSIS — R0600 Dyspnea, unspecified: Secondary | ICD-10-CM

## 2021-02-23 DIAGNOSIS — R509 Fever, unspecified: Secondary | ICD-10-CM

## 2021-02-23 DIAGNOSIS — R590 Localized enlarged lymph nodes: Secondary | ICD-10-CM | POA: Diagnosis present

## 2021-02-23 DIAGNOSIS — F32A Depression, unspecified: Secondary | ICD-10-CM | POA: Diagnosis present

## 2021-02-23 DIAGNOSIS — I341 Nonrheumatic mitral (valve) prolapse: Secondary | ICD-10-CM | POA: Diagnosis present

## 2021-02-23 LAB — TROPONIN I (HIGH SENSITIVITY): Troponin I (High Sensitivity): 5 ng/L (ref <18)

## 2021-02-23 LAB — URINALYSIS, COMPLETE (UACMP) WITH MICROSCOPIC
Bacteria, UA: NONE SEEN
Bilirubin Urine: NEGATIVE
Glucose, UA: NEGATIVE mg/dL
Hgb urine dipstick: NEGATIVE
Ketones, ur: 20 mg/dL — AB
Nitrite: NEGATIVE
Protein, ur: 100 mg/dL — AB
Specific Gravity, Urine: 1.029 (ref 1.005–1.030)
pH: 5 (ref 5.0–8.0)

## 2021-02-23 LAB — PROCALCITONIN: Procalcitonin: <0.1 ng/mL

## 2021-02-23 LAB — RESP PANEL BY RT-PCR (FLU A&B, COVID) ARPGX2
Influenza A by PCR: NEGATIVE
Influenza B by PCR: NEGATIVE
SARS Coronavirus 2 by RT PCR: NEGATIVE

## 2021-02-23 LAB — HIV ANTIBODY (ROUTINE TESTING W REFLEX): HIV Screen 4th Generation wRfx: NONREACTIVE

## 2021-02-23 MED ORDER — CYCLOSPORINE 0.05 % OP EMUL
1.0000 [drp] | Freq: Two times a day (BID) | OPHTHALMIC | Status: DC
  Administered 2021-02-23 – 2021-02-25 (×5): 1 [drp] via OPHTHALMIC
  Filled 2021-02-23 (×6): qty 1

## 2021-02-23 MED ORDER — CEFTRIAXONE SODIUM 2 G IJ SOLR: 2.0000 g | INTRAMUSCULAR | Status: DC

## 2021-02-23 MED ORDER — FUROSEMIDE 40 MG PO TABS
80.0000 mg | ORAL_TABLET | Freq: Every day | ORAL | Status: DC
  Administered 2021-02-24 – 2021-02-25 (×2): 80 mg via ORAL
  Filled 2021-02-23 (×2): qty 2

## 2021-02-23 MED ORDER — SODIUM CHLORIDE 0.9 % IV SOLN
1.0000 g | Freq: Once | INTRAVENOUS | Status: AC
  Administered 2021-02-23: 1 g via INTRAVENOUS
  Filled 2021-02-23: qty 10

## 2021-02-23 MED ORDER — FUROSEMIDE 10 MG/ML IJ SOLN
40.0000 mg | Freq: Once | INTRAMUSCULAR | Status: AC
  Administered 2021-02-23: 40 mg via INTRAVENOUS
  Filled 2021-02-23: qty 4

## 2021-02-23 MED ORDER — LEVOTHYROXINE SODIUM 75 MCG PO TABS
150.0000 ug | ORAL_TABLET | Freq: Every day | ORAL | Status: DC
  Administered 2021-02-24 – 2021-02-25 (×2): 150 ug via ORAL
  Filled 2021-02-23 (×2): qty 2
  Filled 2021-02-23: qty 1

## 2021-02-23 MED ORDER — HYDROCOD POLST-CPM POLST ER 10-8 MG/5ML PO SUER
5.0000 mL | Freq: Once | ORAL | Status: AC
  Administered 2021-02-23: 5 mL via ORAL
  Filled 2021-02-23: qty 5

## 2021-02-23 MED ORDER — ASCORBIC ACID 500 MG PO TABS
1000.0000 mg | ORAL_TABLET | Freq: Every evening | ORAL | Status: DC
  Administered 2021-02-23 – 2021-02-25 (×3): 1000 mg via ORAL
  Filled 2021-02-23 (×3): qty 2

## 2021-02-23 MED ORDER — AMIODARONE HCL 200 MG PO TABS
200.0000 mg | ORAL_TABLET | Freq: Every day | ORAL | Status: DC
  Administered 2021-02-23 – 2021-02-25 (×3): 200 mg via ORAL
  Filled 2021-02-23 (×3): qty 1

## 2021-02-23 MED ORDER — POTASSIUM CHLORIDE CRYS ER 20 MEQ PO TBCR
40.0000 meq | EXTENDED_RELEASE_TABLET | Freq: Once | ORAL | Status: AC
  Administered 2021-02-23: 40 meq via ORAL
  Filled 2021-02-23: qty 2

## 2021-02-23 MED ORDER — FAMOTIDINE 20 MG PO TABS
40.0000 mg | ORAL_TABLET | Freq: Every day | ORAL | Status: DC
  Administered 2021-02-23 – 2021-02-24 (×2): 40 mg via ORAL
  Filled 2021-02-23 (×2): qty 2

## 2021-02-23 MED ORDER — FUROSEMIDE 40 MG PO TABS: 80.0000 mg | ORAL_TABLET | Freq: Every day | ORAL | Status: DC

## 2021-02-23 MED ORDER — TRAZODONE HCL 50 MG PO TABS
50.0000 mg | ORAL_TABLET | Freq: Every evening | ORAL | Status: DC | PRN
  Administered 2021-02-24: 21:00:00 50 mg via ORAL
  Filled 2021-02-23 (×2): qty 1

## 2021-02-23 MED ORDER — SODIUM CHLORIDE 0.9 % IV SOLN
500.0000 mg | INTRAVENOUS | Status: DC
  Administered 2021-02-24 (×2): 500 mg via INTRAVENOUS
  Filled 2021-02-23 (×3): qty 500

## 2021-02-23 MED ORDER — ORAL CARE MOUTH RINSE
15.0000 mL | Freq: Two times a day (BID) | OROMUCOSAL | Status: DC
  Administered 2021-02-23 – 2021-02-25 (×5): 15 mL via OROMUCOSAL
  Filled 2021-02-23: qty 15

## 2021-02-23 MED ORDER — SACUBITRIL-VALSARTAN 24-26 MG PO TABS
1.0000 | ORAL_TABLET | Freq: Two times a day (BID) | ORAL | Status: DC
  Administered 2021-02-23 – 2021-02-25 (×5): 1 via ORAL
  Filled 2021-02-23 (×6): qty 1

## 2021-02-23 MED ORDER — B COMPLEX-C PO TABS
1.0000 | ORAL_TABLET | Freq: Every day | ORAL | Status: DC
  Administered 2021-02-23 – 2021-02-25 (×3): 1 via ORAL
  Filled 2021-02-23 (×3): qty 1

## 2021-02-23 MED ORDER — SODIUM CHLORIDE 0.9 % IV SOLN
500.0000 mg | Freq: Once | INTRAVENOUS | Status: AC
  Administered 2021-02-23: 500 mg via INTRAVENOUS
  Filled 2021-02-23: qty 500

## 2021-02-23 MED ORDER — SODIUM CHLORIDE 0.9 % IV SOLN
2.0000 g | INTRAVENOUS | Status: DC
  Administered 2021-02-24 – 2021-02-25 (×2): 2 g via INTRAVENOUS
  Filled 2021-02-23 (×2): qty 2

## 2021-02-23 MED ORDER — APIXABAN 5 MG PO TABS
5.0000 mg | ORAL_TABLET | Freq: Two times a day (BID) | ORAL | Status: DC
  Administered 2021-02-23 – 2021-02-25 (×5): 5 mg via ORAL
  Filled 2021-02-23 (×5): qty 1

## 2021-02-23 MED ORDER — POTASSIUM CHLORIDE CRYS ER 10 MEQ PO TBCR
10.0000 meq | EXTENDED_RELEASE_TABLET | Freq: Two times a day (BID) | ORAL | Status: DC
  Administered 2021-02-23 – 2021-02-25 (×5): 10 meq via ORAL
  Filled 2021-02-23 (×5): qty 1

## 2021-02-23 MED ORDER — VITAMIN D3 25 MCG (1000 UNIT) PO TABS
2000.0000 [IU] | ORAL_TABLET | Freq: Every day | ORAL | Status: DC
  Administered 2021-02-23 – 2021-02-24 (×2): 2000 [IU] via ORAL
  Filled 2021-02-23 (×5): qty 2

## 2021-02-23 MED ORDER — AZITHROMYCIN 500 MG IV SOLR: 500.0000 mg | INTRAVENOUS | Status: DC

## 2021-02-23 NOTE — TOC Initial Note (Signed)
Transition of Care [facility]) - Initial/Assessment Note    Patient Details  Name: Mandy Jones MRN: [id number] Date of Birth: 1951/08/14  Transition of Care [facility]) CM/SW Contact:    Shelbie Hutching, RN Phone Number: 02/23/2021, 10:11 AM  Clinical Narrative:                 Patient admitted to the hospital with community acquired pneumonia currently on acute O2.  RNCM met with patient at the bedside this morning.  Patient is from home where she lives alone and is independent in ADL's.  Patient has DME in her shed from when she had knee replacements but does not currently use anything.  Patient drives.  She is current with her PCP and uses [facility] for [facility].  Patient drove herself to the hospital so she will drive herself home.   No discharge needs identified at this time.    Expected Discharge Plan: Home/Self Care Barriers to Discharge: Continued Medical Work up   Patient Goals and CMS Choice Patient states their goals for this hospitalization and ongoing recovery are:: Hopes to go home tomorrow      Expected Discharge Plan and Services Expected Discharge Plan: Home/Self Care   Discharge Planning Services: CM Consult   Living arrangements for the past 2 months: Mobile Home                 DME Arranged: N/A DME Agency: NA       HH Arranged: NA          Prior Living Arrangements/Services Living arrangements for the past 2 months: Mobile Home Lives with:: Self   Do you feel safe going back to the place where you live?: Yes      Need for Family Participation in Patient Care: No (Comment) Care giver support system in place?: No (comment) Current home services: DME (walker, cane, 3 in1) Criminal Activity/Legal Involvement Pertinent to Current Situation/Hospitalization: No - Comment as needed  Activities of Daily Living Home Assistive Devices/Equipment: Cane (specify quad or straight), Shower chair without back, Eyeglasses, Grab bars in shower, Grab bars  around toilet ADL Screening (condition at time of admission) Patient's cognitive ability adequate to safely complete daily activities?: Yes Is the patient deaf or have difficulty hearing?: No Does the patient have difficulty seeing, even when wearing glasses/contacts?: No Does the patient have difficulty concentrating, remembering, or making decisions?: No Patient able to express need for assistance with ADLs?: Yes Does the patient have difficulty dressing or bathing?: No Independently performs ADLs?: Yes (appropriate for developmental age) Does the patient have difficulty walking or climbing stairs?: Yes Weakness of Legs: None Weakness of Arms/Hands: None  Permission Sought/Granted   Permission granted to share information with : No              Emotional Assessment Appearance:: Appears stated age Attitude/Demeanor/Rapport: Engaged Affect (typically observed): Accepting Orientation: : Oriented to Self, Oriented to Place, Oriented to  Time, Oriented to Situation Alcohol / Substance Use: Not Applicable Psych Involvement: No (comment)  Admission diagnosis:  Cough [R05.9] Hypokalemia [E87.6] CAP (community acquired pneumonia) [J18.9] Fever, unspecified fever cause [R50.9] Dyspnea, unspecified type [R06.00] Community acquired pneumonia of left lower lobe of lung [J18.9] Acute on chronic congestive heart failure, unspecified heart failure type ([place]) [I50.9] Patient Active Problem List   Diagnosis Date Noted   CAP (community acquired pneumonia) 02/23/2021   Chronic diastolic CHF (congestive heart failure) ([place]) 02/23/2021   Hypokalemia 02/23/2021   Obesity, Class  III, BMI 40-49.9 (morbid obesity) ([place]) 58/00/6349   Acute diastolic heart failure ([place]) 02/10/2020   Acute respiratory failure with hypoxia ([place]) 02/09/2020   Hydronephrosis with renal and ureteral calculus obstruction 02/09/2020   Acute pyelonephritis 49/44/7395   Acute diastolic CHF (congestive heart failure) ([place])  02/09/2020   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/09/2020   AF (paroxysmal atrial fibrillation) ([place]) s/p ablation 02/09/2020   Post-op pain 04/08/2018   Post-operative pain 04/06/2018   Atrial fibrillation with RVR ([place]) 12/14/2017   Hypothyroidism 12/14/2017   Valvular heart disease 12/14/2017   Muscle cramps 12/14/2017   S/P complete thyroidectomy 08/03/2017   PCP:  Loraine Grip, MD Pharm[facility]   [id number] [place] [id number]: [phone]: [phone]    Social Determinants of Health (SDOH) Interventions    Readmission Risk Interventions No flowsheet data found.

## 2021-02-23 NOTE — ED Notes (Signed)
Pt placed on 2 liters [place] per EDP request

## 2021-02-23 NOTE — ED Notes (Signed)
Ambulated pt in hallway, saturations 90-93% on room air, hr 110-123, pt with increased coughing

## 2021-02-23 NOTE — ED Notes (Signed)
ED Provider at bedside. 

## 2021-02-23 NOTE — H&P (Addendum)
History and Physical    Mandy Jones [facility] ATF:[id number] DOB: 1951-01-11 DOA: 02/23/2021  PCP: Loraine Grip, MD   Patient coming from: Home  I have personally briefly reviewed patient's old medical records in [place]  Chief Complaint: Fever  HPI: Mandy Jones is a 70 y.o. female with medical history significant for atrial fibrillation status post pacemaker insertion 5 weeks ago, depression, GERD, chronic CHF, hypertension who presents to the emergency room for evaluation of a 1 week history of fever with a T-max of 102F, shortness of breath with exertion and a dry cough.  Over the last 24 hours she developed chest pain which she described as a tight band across her chest that concerned her for possible cardiac event and so she presented to the ER for further evaluation. She had a negative home COVID-19 test on 06/25 and is vaccinated and boosted against the COVID-19 virus. She denies having any urinary symptoms, no abdominal pain, no changes in her bowel habits, no headache, no dizziness, no lightheadedness, no palpitations, no diaphoresis, no nausea, no vomiting no blurred vision no focal deficits. Labs show sodium 137, potassium 3.2, chloride 103, bicarb 26, glucose 124, BUN 15, creatinine 0.54, calcium 8.8, alkaline phosphatase 110, albumin 3.7, AST 17, ALT 23, total protein 7.0, BNP 108, troponin 5, lactic acid 1.0, procalcitonin less than 0.10, white count 8.1, hemoglobin 13.1, hematocrit 39.7, MCV 88.2, RDW 13.7, platelet count 256 Respiratory viral panel is negative Chest x-ray reviewed by me shows Cardiomegaly with mild central vascular congestion. No focal consolidation. CT scan of the chest without contrast shows Nodular infiltrate throughout the left lower lobe, likely infectious in the acute setting with associated left mediastinal adenopathy. Multiple 5 mm indeterminate pulmonary nodules within the right upper lobe and right middle lobe. No follow-up needed if  patient is low-risk (and has no known or suspected primary neoplasm). Non-contrast chest CT can be considered in 12 months if patient is high-risk.   Moderate coronary artery calcification. Mild cardiomegaly. Morphologic changes in keeping with pulmonary arterial hypertension. Aortic Atherosclerosis  Twelve-lead EKG reviewed by me shows atrial fibrillation with rapid ventricular rate and occasional PVCs.  T wave abnormality in the anterior leads.  ED Course: Patient is a 70 year old female who presents to the ER for evaluation of fever, dry cough and shortness of breath for over a week as well as new onset chest pain which she describes as bandlike across her chest. She ruled out for an acute coronary syndrome. Imaging is suggestive of a left lower lobe infiltrate most likely infectious. Patient received a dose of Rocephin and Zithromax and will be admitted to the hospital for further evaluation.    Review of Systems: As per HPI otherwise all other systems reviewed and negative.    Past Medical History:  Diagnosis Date   Arrhythmia    atrial fibrillation   Arthritis    CHF (congestive heart failure) (HCC)    Depression    Dyspnea    Dysrhythmia    Fibromyalgia 1992   GERD (gastroesophageal reflux disease)    Hepatitis    Hepatitis B 1985   Hyperlipidemia    Hypertension    Mitral valve prolapse    Pacemaker    Sleep apnea    CPAP   Tendon tear, ankle    rt foot   Thyroid dysfunction     Past Surgical History:  Procedure Laterality Date   ACHILLES TENDON SURGERY Right 04/06/2018   Procedure: ACHILLES TENDON REPAIR;  Surgeon: [name], DPM;  Location: [facility] ORS;  Service: Podiatry;  Laterality: Right;   CARDIOVERSION N/A 02/06/2018   Procedure: CARDIOVERSION;  Surgeon: Corey Skains, MD;  Location: [facility] ORS;  Service: Cardiovascular;  Laterality: N/A;   CARDIOVERSION N/A 03/28/2018   Procedure: CARDIOVERSION;  Surgeon: Corey Skains, MD;  Location: [facility] ORS;   Service: Cardiovascular;  Laterality: N/A;   CARDIOVERSION N/A 05/09/2018   Procedure: CARDIOVERSION;  Surgeon: Corey Skains, MD;  Location: [facility] ORS;  Service: Cardiovascular;  Laterality: N/A;   CARDIOVERSION N/A 10/23/2019   Procedure: CARDIOVERSION;  Surgeon: Corey Skains, MD;  Location: [place] [facility];  Service: Cardiovascular;  Laterality: N/A;   CARDIOVERSION N/A 09/30/2020   Procedure: CARDIOVERSION;  Surgeon: Corey Skains, MD;  Location: [facility] OR[facility];  Service: Cardiovascular;  Laterality: N/A;   CARDIOVERSION N/A 11/19/2020   Procedure: CARDIOVERSION;  Surgeon: Corey Skains, MD;  Location: [facility];  Service: Cardiovascular;  Laterality: N/A;   CHOLECYSTECTOMY     CONTINUOUS NERVE MONITORING N/A 08/03/2017   Procedure: LARYNGEAL NERVE MONITORING;  Surgeon: Carloyn Manner, MD;  Location: [facility] ORS;  Service: ENT;  Laterality: N/A;   EXTRACORPOREAL SHOCK WAVE LITHOTRIPSY Right 02/13/2020   Procedure: EXTRACORPOREAL SHOCK WAVE LITHOTRIPSY (ESWL);  Surgeon: Billey Co, MD;  Location: [facility] OR[facility];  Service: Urology;  Laterality: Right;   EYE SURGERY Bilateral    FOOT SURGERY Left 2000   JOINT REPLACEMENT     left knee   OSTECTOMY Right 04/06/2018   Procedure: OSTECTOMY-HAGLUNDS/RECTROCALCANEAL;  Surgeon: [name], DPM;  Location: [facility];  Service: Podiatry;  Laterality: Right;   SHOULDER ARTHROSCOPY Bilateral    SHOULDER ARTHROSCOPY Left    THYROIDECTOMY N/A 08/03/2017   Procedure: THYROIDECTOMY;  Surgeon: Carloyn Manner, MD;  Location: [facility] ORS;  Service: ENT;  Laterality: N/A;   TONSILLECTOMY     TUBAL LIGATION       reports that she has quit smoking. She has never used smokeless tobacco. She reports current alcohol use. She reports previous drug use. Drug: Marijuana.  Allergies  Allergen Reactions   Tape Rash    Pads used for ablation left a rash for a couple of days.    Family History  Problem Relation Age of Onset   Hypertension Mother    Gastric  cancer Father       Prior to Admission medications   Medication Sig Start Date End Date Taking? Authorizing Provider  amiodarone (PACERONE) 200 MG tablet Take 1 tablet (200 mg total) by mouth daily. 11/19/20  Yes Corey Skains, MD  apixaban (ELIQUIS) 5 MG TABS tablet Take 1 tablet (5 mg total) by mouth 2 (two) times daily. 02/14/20  Yes Val Riles, MD  Ascorbic Acid (VITAMIN C) 1000 MG tablet Take 1,000 mg by mouth every evening.   Yes [provider]  B Complex-C (B-COMPLEX WITH VITAMIN C) tablet Take 1 tablet by mouth daily.   Yes [provider]  Boswellia-Glucosamine-Vit D (OSTEO BI-FLEX ONE PER DAY) TABS Take 1 tablet by mouth daily.   Yes [provider]  Calcium-Magnesium-Vitamin D (CALCIUM MAGNESIUM PO) Take 1 tablet by mouth daily.   Yes [provider]  Cholecalciferol (VITAMIN D3) 50 MCG (2000 UT) TABS Take 2,000 Units by mouth at bedtime.   Yes [provider]  cycloSPORINE (RESTASIS) 0.05 % ophthalmic emulsion Place 1 drop into both eyes 2 (two) times daily.   Yes [provider]  famotidine (PEPCID) 20 MG tablet Take 40 mg by  mouth at bedtime. 10/09/19  Yes [provider]  furosemide (LASIX) 40 MG tablet Take 1 tablet (40 mg total) by mouth 2 (two) times daily. Patient taking differently: Take 80 mg by mouth daily. 02/11/20  Yes Val Riles, MD  levothyroxine (SYNTHROID) 150 MCG tablet Take 150 mcg by mouth daily before breakfast. 10/15/19  Yes [provider]  melatonin 5 MG TABS Take 5 mg by mouth at bedtime as needed.   Yes [provider]  Multiple Vitamins-Minerals (LUTEIN-ZEAXANTHIN) TABS Take 1 tablet by mouth in the morning and at bedtime.   Yes [provider]  potassium chloride (KLOR-CON) 10 MEQ tablet Take 10 mEq by mouth 2 (two) times daily. 09/12/20  Yes [provider]  Red Yeast Rice Extract 600 MG CAPS Take 600 mg by mouth every evening.   Yes [provider]  sacubitril-valsartan (ENTRESTO) 24-26 MG Take 1 tablet by mouth 2 (two) times daily. Needs appt for further refills or request goes to [facility] 11/14/20  Yes Hackney, Aura Fey, FNP  traZODone (DESYREL) 50 MG tablet Take 50 mg by mouth at bedtime as needed for sleep. 12/04/17  Yes [provider]  valACYclovir (VALTREX) 1000 MG tablet Take 2,000 mg by mouth as directed. For breakout 11/23/20  Yes [provider]    Physical Exam: Vitals:   02/23/21 0330 02/23/21 0400 02/23/21 0605 02/23/21 0840  BP: 120/72 114/79 108/61 131/80  Pulse: 88 92 82 88  Resp: (!) 26 (!) 24 (!) 27 16  Temp:    98.5 F (36.9 C)  TempSrc:      SpO2: 95% 95% 95% 98%  Weight:      Height:         Vitals:   02/23/21 0330 02/23/21 0400 02/23/21 0605 02/23/21 0840  BP: 120/72 114/79 108/61 131/80  Pulse: 88 92 82 88  Resp: (!) 26 (!) 24 (!) 27 16  Temp:    98.5 F (36.9 C)  TempSrc:      SpO2: 95% 95% 95% 98%  Weight:      Height:          Constitutional: Alert and oriented x 3 . Not in any apparent distress HEENT:      Head: Normocephalic and atraumatic.         Eyes: PERLA, EOMI, Conjunctivae are normal. Sclera is non-icteric.       Mouth/Throat: Mucous membranes are moist.       Neck: Supple with no signs of meningismus. Cardiovascular: Irregularly irregular. No murmurs, gallops, or rubs. 2+ symmetrical distal pulses are present . No JVD. No LE edema Respiratory: Respiratory effort normal .rhonchi at the left lung base. No wheezes or crackles Gastrointestinal: Soft, non tender, and non distended with positive bowel sounds.  Genitourinary: No CVA tenderness. Musculoskeletal: Nontender with normal range of motion in all extremities. No cyanosis, or erythema of extremities. Neurologic:  Face is symmetric. Moving all extremities. No gross focal neurologic deficits . Skin: Skin is warm, dry.  No rash or ulcers Psychiatric: Mood and affect are normal    Labs on  Admission: I have personally reviewed following labs and imaging studies  CBC: Recent Labs  Lab 02/22/21 2226  WBC 8.1  HGB 13.1  HCT 39.7  MCV 88.2  PLT 741   Basic Metabolic Panel: Recent Labs  Lab 02/22/21 2226  NA 137  K 3.2*  CL 103  CO2 26  GLUCOSE 124*  BUN 15  CREATININE 0.54  CALCIUM  8.8*   GFR: Estimated Creatinine Clearance: 71.1 mL/min (by C-G formula based on SCr of 0.54 mg/dL). Liver Function Tests: Recent Labs  Lab 02/22/21 2226  AST 17  ALT 23  ALKPHOS 110  BILITOT 0.8  PROT 7.0  ALBUMIN 3.7   No results for input(s): LIPASE, AMYLASE in the last 168 hours. No results for input(s): AMMONIA in the last 168 hours. Coagulation Profile: No results for input(s): INR, PROTIME in the last 168 hours. Cardiac Enzymes: No results for input(s): CKTOTAL, CKMB, CKMBINDEX, TROPONINI in the last 168 hours. BNP (last 3 results) No results for input(s): PROBNP in the last 8760 hours. HbA1C: No results for input(s): HGBA1C in the last 72 hours. CBG: No results for input(s): GLUCAP in the last 168 hours. Lipid Profile: No results for input(s): CHOL, HDL, LDLCALC, TRIG, CHOLHDL, LDLDIRECT in the last 72 hours. Thyroid Function Tests: No results for input(s): TSH, T4TOTAL, FREET4, T3FREE, THYROIDAB in the last 72 hours. Anemia Panel: No results for input(s): VITAMINB12, FOLATE, FERRITIN, TIBC, IRON, RETICCTPCT in the last 72 hours. Urine analysis:    Component Value Date/Time   COLORURINE YELLOW (A) 02/23/2021 0111   APPEARANCEUR CLOUDY (A) 02/23/2021 0111   APPEARANCEUR Clear 04/09/2020 1519   LABSPEC 1.029 02/23/2021 0111   PHURINE 5.0 02/23/2021 0111   GLUCOSEU NEGATIVE 02/23/2021 0111   HGBUR NEGATIVE 02/23/2021 0111   BILIRUBINUR NEGATIVE 02/23/2021 0111   BILIRUBINUR Negative 04/09/2020 1519   KETONESUR 20 (A) 02/23/2021 0111   PROTEINUR 100 (A) 02/23/2021 0111   NITRITE NEGATIVE 02/23/2021 0111   LEUKOCYTESUR TRACE (A) 02/23/2021 0111     Radiological Exams on Admission: DG Chest 2 View  Result Date: 02/22/2021 CLINICAL DATA:  70 year old female with chest pain. EXAM: CHEST - 2 VIEW COMPARISON:  Chest radiograph dated 02/09/2020. FINDINGS: Mild cardiomegaly with mild central vascular congestion. No focal consolidation, pleural effusion, pneumothorax. No acute osseous pathology. Left pectoral pacemaker device. Degenerative changes of the spine. IMPRESSION: Cardiomegaly with mild central vascular congestion. No focal consolidation. Electronically Signed   By: Anner Crete M.D.   On: 02/22/2021 22:51   CT Chest Wo Contrast  Result Date: 02/23/2021 CLINICAL DATA:  Chest pain, cough, fever, dyspnea EXAM: CT CHEST WITHOUT CONTRAST TECHNIQUE: Multidetector CT imaging of the chest was performed following the standard protocol without IV contrast. COMPARISON:  None. FINDINGS: Cardiovascular: Moderate coronary artery calcification largely within the left main and left anterior descending coronary arteries. Mild cardiomegaly. Left subclavian pacemaker leads are seen within the right atrium and right ventricle. No pericardial effusion. The central pulmonary arteries are enlarged in keeping with changes of pulmonary artery hypertension. Mild atherosclerotic calcification within the thoracic aorta. No aortic aneurysm. Mediastinum/Nodes: The thyroid gland is absent. There is shotty prevascular and aortopulmonary window lymphadenopathy, possibly reactive in nature. No additional pathologic thoracic adenopathy. The esophagus is unremarkable. Lungs/Pleura: There is nodular infiltrates scattered throughout the left lower lobe, possibly infectious in the acute setting. Minimal ground-glass infiltrate is also identified within the right lower lobe. 5 mm noncalcified subpleural pulmonary nodule is seen within the right middle lobe, axial image # 57/3, indeterminate. 5 mm indeterminate pulmonary nodule is seen within the right upper lobe, axial image #  44/3, indeterminate. No pneumothorax or pleural effusion. Central airways are widely patent. 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 Nodular thickening of the left adrenal gland is unchanged. No acute abnormality within  the visualized upper abdomen. Musculoskeletal: No acute bone abnormality. No lytic or blastic bone lesion identified. IMPRESSION: Nodular infiltrate throughout the left lower lobe, likely infectious in the acute setting with associated left mediastinal adenopathy. Multiple 5 mm indeterminate pulmonary nodules within the right upper lobe and right middle lobe. No follow-up needed if patient is low-risk (and has no known or suspected primary neoplasm). Non-contrast chest CT can be considered in 12 months if patient is high-risk. This recommendation follows the consensus statement: Guidelines for Management of Incidental Pulmonary Nodules Detected on CT Images: From the Fleischner Society 2017; Radiology 2017; 284:228-243. Moderate coronary artery calcification. Mild cardiomegaly. Morphologic changes in keeping with pulmonary arterial hypertension. Aortic Atherosclerosis (ICD10-I70.0). Electronically Signed   By: Fidela Salisbury MD   On: 02/23/2021 04:50     Assessment/Plan Principal Problem:   CAP (community acquired pneumonia) Active Problems:   Atrial fibrillation with RVR (HCC)   Hypothyroidism   Chronic diastolic CHF (congestive heart failure) (HCC)   Hypokalemia   Obesity, Class III, BMI 40-49.9 (morbid obesity) ([place])     Community-acquired pneumonia Patient presents for evaluation of 1 week history of intermittent fever associated with a dry cough and exertional shortness of breath. Imaging shows a left lower lobe pneumonia Will place patient on Rocephin and Zithromax to complete a 5-day course of therapy Follow-up results of blood culture    Atrial  fibrillation with rapid ventricular rate Continue amiodarone for rate control Continue Eliquis as primary prophylaxis for an acute stroke    Chronic diastolic dysfunction CHF Not acutely exacerbated Last known LVEF 55 - 60% Continue furosemide and Entresto    Hypokalemia Secondary to diuretic use Supplement potassium    Hypothyroidism Continue Synthroid    Morbid obesity (BMI 40) Complicates overall prognosis and care Lifestyle modification and exercise has been discussed with patient in detail  DVT prophylaxis: Apixaban Code Status: full code  Family Communication: Greater than 50% of time was spent discussing patient's condition and plan of care with her at the bedside.  All questions and concerns have been addressed.  She verbalizes understanding and agrees with the plan. Disposition Plan: Back to previous home environment Consults called: none  Status: At the time of admission, it appears that the appropriate admission status for this patient is inpatient. This is judged to be reasonable and necessary in order to provide the required intensity of service to ensure the patient's safety given the presenting symptoms, physical exam findings, and initial radiographic and laboratory data in the context of their comorbid conditions. Patient requires inpatient status due to high intensity of service, high risk for further deterioration and high frequency of surveillance required.    Collier Bullock MD [facility]     02/23/2021, 9:26 AM

## 2021-02-23 NOTE — ED Provider Notes (Signed)
[facility] Emergency Department Provider Note   ____________________________________________   Event Date/Time   First MD Initiated Contact with Patient 02/23/21 0210     (approximate)  I have reviewed the triage vital signs and the nursing notes.   HISTORY  Chief Complaint Fever, Cough, and Chest Pain    HPI Mandy Jones is a 70 y.o. female referred to the ED from urgent care with a chief complaint of fever, cough, chest pain and shortness of breath.  Patient with a history of CHF not on home oxygen, atrial fibrillation on Pacerone and Eliquis.  Reports a 1 week history of nonproductive cough, fever to 102.6 F at home.  Reports shortness of breath and chest pain starting yesterday.  Reports decreased oral intake.  States not urinating as well on Lasix.  Denies abdominal pain, nausea, vomiting or dizziness.     Past Medical History:  Diagnosis Date  . Arrhythmia    atrial fibrillation  . Arthritis   . CHF (congestive heart failure) ([place])   . Depression   . Dyspnea   . Dysrhythmia   . Fibromyalgia 1992  . GERD (gastroesophageal reflux disease)   . Hepatitis    Hepatitis B 1985  . Hyperlipidemia   . Hypertension   . Mitral valve prolapse   . Pacemaker   . Sleep apnea    CPAP  . Tendon tear, ankle    rt foot  . Thyroid dysfunction     Patient Active Problem List   Diagnosis Date Noted  . Acute diastolic heart failure ([place]) 02/10/2020  . Acute respiratory failure with hypoxia ([place]) 02/09/2020  . Hydronephrosis with renal and ureteral calculus obstruction 02/09/2020  . Acute pyelonephritis 02/09/2020  . Acute diastolic CHF (congestive heart failure) ([place]) 02/09/2020  . 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 AF (paroxysmal atrial fibrillation) ([place]) s/p ablation 02/09/2020  . Post-op pain 04/08/2018  . Post-operative pain 04/06/2018  . Atrial fibrillation with RVR ([place]) 12/14/2017  . Hypothyroidism 12/14/2017  . Valvular  heart disease 12/14/2017  . Muscle cramps 12/14/2017  . S/P complete thyroidectomy 08/03/2017    Past Surgical History:  Procedure Laterality Date  . ACHILLES TENDON SURGERY Right 04/06/2018   Procedure: ACHILLES TENDON REPAIR;  Surgeon: [name], DPM;  Location: [facility] ORS;  Service: Podiatry;  Laterality: Right;  . CARDIOVERSION N/A 02/06/2018   Procedure: CARDIOVERSION;  Surgeon: Corey Skains, MD;  Location: [facility] ORS;  Service: Cardiovascular;  Laterality: N/A;  . CARDIOVERSION N/A 03/28/2018   Procedure: CARDIOVERSION;  Surgeon: Corey Skains, MD;  Location: [facility] ORS;  Service: Cardiovascular;  Laterality: N/A;  . CARDIOVERSION N/A 05/09/2018   Procedure: CARDIOVERSION;  Surgeon: Corey Skains, MD;  Location: [facility] OR[facility];  Service: Cardiovascular;  Laterality: N/A;  . CARDIOVERSION N/A 10/23/2019   Procedure: CARDIOVERSION;  Surgeon: Corey Skains, MD;  Location: [facility] OR[facility];  Service: Cardiovascular;  Laterality: N/A;  . CARDIOVERSION N/A 09/30/2020   Procedure: CARDIOVERSION;  Surgeon: Corey Skains, MD;  Location: [facility];  Service: Cardiovascular;  Laterality: N/A;  . CARDIOVERSION N/A 11/19/2020   Procedure: CARDIOVERSION;  Surgeon: Corey Skains, MD;  Location: [facility] OR[facility];  Service: Cardiovascular;  Laterality: N/A;  . CHOLECYSTECTOMY    . CONTINUOUS NERVE MONITORING N/A 08/03/2017   Procedure: LARYNGEAL NERVE MONITORING;  Surgeon: Carloyn Manner, MD;  Location: [facility] OR[facility];  Service: ENT;  Laterality: N/A;  . EXTRACORPOREAL SHOCK WAVE LITHOTRIPSY Right 02/13/2020   Procedure: EXTRACORPOREAL SHOCK WAVE LITHOTRIPSY (ESWL);  Surgeon:  Billey Co, MD;  Location: [facility] ORS;  Service: Urology;  Laterality: Right;  . EYE SURGERY Bilateral   . FOOT SURGERY Left 2000  . JOINT REPLACEMENT     left knee  . OSTECTOMY Right 04/06/2018   Procedure: OSTECTOMY-HAGLUNDS/RECTROCALCANEAL;  Surgeon: [name], DPM;  Location: [facility] OR[facility];  Service: Podiatry;  Laterality: Right;   . SHOULDER ARTHROSCOPY Bilateral   . SHOULDER ARTHROSCOPY Left   . THYROIDECTOMY N/A 08/03/2017   Procedure: THYROIDECTOMY;  Surgeon: Carloyn Manner, MD;  Location: [facility] ORS;  Service: ENT;  Laterality: N/A;  . TONSILLECTOMY    . TUBAL LIGATION      Prior to Admission medications   Medication Sig Start Date End Date Taking? Authorizing Provider  albuterol (VENTOLIN HFA) 108 (90 Base) MCG/ACT inhaler Inhale 1-2 puffs into the lungs every 6 (six) hours as needed for wheezing or shortness of breath.    [provider]  amiodarone (PACERONE) 200 MG tablet Take 1 tablet (200 mg total) by mouth daily. 11/19/20   Corey Skains, MD  apixaban (ELIQUIS) 5 MG TABS tablet Take 1 tablet (5 mg total) by mouth 2 (two) times daily. 02/14/20   Val Riles, MD  Ascorbic Acid (VITAMIN C) 1000 MG tablet Take 1,000 mg by mouth every evening.    [provider]  B Complex-C (B-COMPLEX WITH VITAMIN C) tablet Take 1 tablet by mouth daily.    [provider]  Boswellia-Glucosamine-Vit D (OSTEO BI-FLEX ONE PER DAY) TABS Take 1 tablet by mouth daily.    [provider]  Calcium-Magnesium-Vitamin D (CALCIUM MAGNESIUM PO) Take 1 tablet by mouth daily.    [provider]  Cholecalciferol (VITAMIN D3) 50 MCG (2000 UT) TABS Take 2,000 Units by mouth at bedtime.    [provider]  cycloSPORINE (RESTASIS) 0.05 % ophthalmic emulsion Place 1 drop into both eyes 2 (two) times daily.    [provider]  famotidine (PEPCID) 20 MG tablet Take 40 mg by mouth at bedtime. 10/09/19   [provider]  furosemide (LASIX) 40 MG tablet Take 1 tablet (40 mg total) by mouth 2 (two) times daily. Patient taking differently: Take 80 mg by mouth daily. 02/11/20   Val Riles, MD  levothyroxine (SYNTHROID) 150 MCG tablet Take 150 mcg by mouth every other day. 10/15/19   [provider]  levothyroxine (SYNTHROID) 175 MCG tablet Take 175 mcg by mouth every other  day. 10/09/19   [provider]  Multiple Vitamins-Minerals (LUTEIN-ZEAXANTHIN) TABS Take 1 tablet by mouth in the morning and at bedtime.    [provider]  potassium chloride (KLOR-CON) 10 MEQ tablet Take 10 mEq by mouth 2 (two) times daily. 09/12/20   [provider]  Red Yeast Rice Extract 600 MG CAPS Take 600 mg by mouth in the morning and at bedtime.    [provider]  sacubitril-valsartan (ENTRESTO) 24-26 MG Take 1 tablet by mouth 2 (two) times daily. Needs appt for further refills or request goes to [facility] 11/14/20   Darylene Price A, FNP  traZODone (DESYREL) 50 MG tablet Take 50 mg by mouth at bedtime as needed for sleep. 12/04/17   [provider]    Allergies Tape  No family history on file.  Social History Social History   Tobacco Use  . Smoking status: Former    Pack years: 0.00  . Smokeless tobacco: Never  . Tobacco comments:    quit 30 years ago  Vaping Use  .  Vaping Use: Never used  Substance Use Topics  . Alcohol use: Yes    Comment: occassional  . Drug use: Not Currently    Types: Marijuana    Review of Systems  Constitutional: Positive for fever Eyes: No visual changes. ENT: No sore throat. Cardiovascular: Positive for chest pain. Respiratory: Positive for cough and shortness of breath. Gastrointestinal: No abdominal pain.  No nausea, no vomiting.  No diarrhea.  No constipation. Genitourinary: Negative for dysuria. Musculoskeletal: Negative for back pain. Skin: Negative for rash. Neurological: Negative for headaches, focal weakness or numbness.   ____________________________________________   PHYSICAL EXAM:  VITAL SIGNS: ED Triage Vitals [02/22/21 2221]  Enc Vitals Group     BP 130/67     Pulse Rate 99     Resp 18     Temp 99 F (37.2 C)     Temp Source Oral     SpO2 99 %     Weight 225 lb (102.1 kg)     Height 5\' 2"  (1.575 m)     Head Circumference      Peak Flow      Pain Score 0      Pain Loc      Pain Edu?      Excl. in [place]?     Constitutional: Alert and oriented.  Elderly appearing and in mild acute distress. Eyes: Conjunctivae are normal. PERRL. EOMI. Head: Atraumatic. Nose: No congestion/rhinnorhea. Mouth/Throat: Mucous membranes are mildly dry. Neck: No stridor.   Cardiovascular: Normal rate, irregular rhythm. Grossly normal heart sounds.  Good peripheral circulation. Respiratory: Increased respiratory effort.  No retractions. Lungs with faint bibasilar rales. Gastrointestinal: Soft and nontender to light or deep palpation. No distention. No abdominal bruits. No CVA tenderness. Musculoskeletal: No lower extremity tenderness.  BLE 1+ nonpitting edema.  No joint effusions. Neurologic:  Normal speech and language. No gross focal neurologic deficits are appreciated. No gait instability. Skin:  Skin is warm, dry and intact. No rash noted. Psychiatric: Mood and affect are normal. Speech and behavior are normal.  ____________________________________________   LABS (all labs ordered are listed, but only abnormal results are displayed)  Labs Reviewed  COMPREHENSIVE METABOLIC PANEL - Abnormal; Notable for the following components:      Result Value   Potassium 3.2 (*)    Glucose, Bld 124 (*)    Calcium 8.8 (*)    All other components within normal limits  BRAIN NATRIURETIC PEPTIDE - Abnormal; Notable for the following components:   B Natriuretic Peptide 108.8 (*)    All other components within normal limits  URINALYSIS, COMPLETE (UACMP) WITH MICROSCOPIC - Abnormal; Notable for the following components:   Color, Urine YELLOW (*)    APPearance CLOUDY (*)    Ketones, ur 20 (*)    Protein, ur 100 (*)    Leukocytes,Ua TRACE (*)    All other components within normal limits  RESP PANEL BY RT-PCR (FLU A&B, COVID) ARPGX2  URINE CULTURE  CULTURE, BLOOD (ROUTINE X 2)  CULTURE, BLOOD (ROUTINE X 2)  CBC  LACTIC ACID, PLASMA  PROCALCITONIN  TROPONIN I (HIGH  SENSITIVITY)  TROPONIN I (HIGH SENSITIVITY)   ____________________________________________  EKG  ED ECG REPORT I, Cayleb Jarnigan J, the attending physician, personally viewed and interpreted this ECG.   Date: 02/23/2021  EKG Time: 2217  Rate: 111  Rhythm: atrial fibrillation, rate 111  Axis: Normal  Intervals:nonspecific intraventricular conduction delay  ST&T Change: Nonspecific  ____________________________________________  RADIOLOGY Cecilie Kicks J, personally viewed and evaluated  these images (plain radiographs) as part of my medical decision making, as well as reviewing the written report by the radiologist.  ED MD interpretation: Pulmonary vascular congestion; CT demonstrates left lower lobe nodular infiltrate, likely infectious  Official radiology report(s): DG Chest 2 View  Result Date: 02/22/2021 CLINICAL DATA:  70 year old female with chest pain. EXAM: CHEST - 2 VIEW COMPARISON:  Chest radiograph dated 02/09/2020. FINDINGS: Mild cardiomegaly with mild central vascular congestion. No focal consolidation, pleural effusion, pneumothorax. No acute osseous pathology. Left pectoral pacemaker device. Degenerative changes of the spine. IMPRESSION: Cardiomegaly with mild central vascular congestion. No focal consolidation. Electronically Signed   By: Anner Crete M.D.   On: 02/22/2021 22:51   CT Chest Wo Contrast  Result Date: 02/23/2021 CLINICAL DATA:  Chest pain, cough, fever, dyspnea EXAM: CT CHEST WITHOUT CONTRAST TECHNIQUE: Multidetector CT imaging of the chest was performed following the standard protocol without IV contrast. COMPARISON:  None. FINDINGS: Cardiovascular: Moderate coronary artery calcification largely within the left main and left anterior descending coronary arteries. Mild cardiomegaly. Left subclavian pacemaker leads are seen within the right atrium and right ventricle. No pericardial effusion. The central pulmonary arteries are enlarged in keeping with changes  of pulmonary artery hypertension. Mild atherosclerotic calcification within the thoracic aorta. No aortic aneurysm. Mediastinum/Nodes: The thyroid gland is absent. There is shotty prevascular and aortopulmonary window lymphadenopathy, possibly reactive in nature. No additional pathologic thoracic adenopathy. The esophagus is unremarkable. Lungs/Pleura: There is nodular infiltrates scattered throughout the left lower lobe, possibly infectious in the acute setting. Minimal ground-glass infiltrate is also identified within the right lower lobe. 5 mm noncalcified subpleural pulmonary nodule is seen within the right middle lobe, axial image # 57/3, indeterminate. 5 mm indeterminate pulmonary nodule is seen within the right upper lobe, axial image # 44/3, indeterminate. No pneumothorax or pleural effusion. Central airways are widely patent. 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 Nodular thickening of the left adrenal gland is unchanged. No acute abnormality within the visualized upper abdomen. Musculoskeletal: No acute bone abnormality. No lytic or blastic bone lesion identified. IMPRESSION: Nodular infiltrate throughout the left lower lobe, likely infectious in the acute setting with associated left mediastinal adenopathy. Multiple 5 mm indeterminate pulmonary nodules within the right upper lobe and right middle lobe. No follow-up needed if patient is low-risk (and has no known or suspected primary neoplasm). Non-contrast chest CT can be considered in 12 months if patient is high-risk. This recommendation follows the consensus statement: Guidelines for Management of Incidental Pulmonary Nodules Detected on CT Images: From the Fleischner Society 2017; Radiology 2017; 284:228-243. Moderate coronary artery calcification. Mild cardiomegaly. Morphologic changes in keeping with pulmonary arterial  hypertension. Aortic Atherosclerosis (ICD10-I70.0). Electronically Signed   By: Fidela Salisbury MD   On: 02/23/2021 04:50    ____________________________________________   PROCEDURES  Procedure(s) performed (including Critical Care):  .1-3 Lead EKG Interpretation  Date/Time: 02/23/2021 2:54 AM Performed by: Paulette Blanch, MD Authorized by: Paulette Blanch, MD     Interpretation: abnormal     ECG rate:  97   ECG rate assessment: normal     Rhythm: atrial fibrillation     Ectopy: none     Conduction: normal   Comments:     Patient placed on cardiac monitor to evaluate for arrhythmias   CRITICAL CARE Performed by: Paulette Blanch   Total critical care time: 45 minutes  Critical care time was exclusive of separately billable procedures and treating other patients.  Critical care was necessary to treat or prevent imminent or life-threatening deterioration.  Critical care was time spent personally by me on the following activities: development of treatment plan with patient and/or surrogate as well as nursing, discussions with consultants, evaluation of patient's response to treatment, examination of patient, obtaining history from patient or surrogate, ordering and performing treatments and interventions, ordering and review of laboratory studies, ordering and review of radiographic studies, pulse oximetry and re-evaluation of patient's condition.  ____________________________________________   INITIAL IMPRESSION / ASSESSMENT AND PLAN / ED COURSE  As part of my medical decision making, I reviewed the following data within the [place] notes reviewed and incorporated, Labs reviewed, EKG interpreted, Old chart reviewed, Radiograph reviewed, Discussed with admitting physician, and Notes from prior ED visits     70 year old female sent from urgent care for fever, cough, chest pain and shortness of breath. Differential includes, but is not limited to, viral  syndrome, bronchitis including COPD exacerbation, pneumonia, reactive airway disease including asthma, CHF including exacerbation with or without pulmonary/interstitial edema, pneumothorax, ACS, thoracic trauma, and pulmonary embolism.   Lab results demonstrate dehydration, mild hypokalemia, pulmonary vascular congestion on chest x-ray.  Normal lactic acid.  Will obtain noncontrast CT scan to delineate fluid versus infection.  Will administer Tussionex for cough, IV Lasix for diuresis.  Will perform ambulation trial and reassess.  Clinical Course as of 02/23/21 0459  Tue Feb 23, 2021  0337 Patient ambulated with increased tachypnea with rate of 27, mild hypoxia to 90%, tachycardia to the 120s. [JS]  0426 Noted leukocyte positive urine.  Given negative lactic acid and procalcitonin, will hold antibiotics pending urine culture.  Awaiting results of CT chest. [JS]  0456 Will administer IV Rocephin and azithromycin based on CT chest results.  Will discuss with hospitalist services for admission. [JS]    Clinical Course User Index [JS] Paulette Blanch, MD     ____________________________________________   FINAL CLINICAL IMPRESSION(S) / ED DIAGNOSES  Final diagnoses:  Fever, unspecified fever cause  Cough  Dyspnea, unspecified type  Acute on chronic congestive heart failure, unspecified heart failure type [facility])  Community acquired pneumonia of left lower lobe of lung  Hypokalemia     ED Discharge Orders     None        Note:  This document was prepared using Dragon voice recognition software and may include unintentional dictation errors.    Paulette Blanch, MD 02/23/21 [phone]

## 2021-02-23 NOTE — Evaluation (Signed)
Physical Therapy Evaluation Patient Details Name: Mandy Jones MRN: [id number] DOB: [birth year] Today's Date: 02/23/2021   History of Present Illness  Per MD notes, pt is a 70 y.o. female with medical history significant for atrial fibrillation status post pacemaker insertion 5 weeks ago, chronic CHF, hypertension who presents to the emergency room for evaluation of a 1 week history of fever with a T-max of 102F, shortness of breath with exertion and a dry cough. MD assessment includes community-acquired pneumonia and atrial fibrillation with rapid ventricular rate.  Clinical Impression  Pt was pleasant and motivated to participate during the session. Pt required no physical assistance for functional mobility. Pt demonstrated some mild drifting right/left and inconsistent cadence while ambulating but remained steady with no LOB. Pt reports gait pattern is secondary to vestibular symptoms that she was being treated for in OPPT prior to hospital admission. Pt HR and SpO2 assessed throughout session on 2L supplemental O2 at SpO2: 98% HR: 80bpm after 10ft walk, SpO2: 97% HR: 83bmp after 105ft walk, and SpO2: 97% HR: 91bpm after 1105ft walk. Pt will benefit from OPPT upon discharge to safely address deficits listed in patient problem list for decreased caregiver assistance and eventual return to PLOF.      Follow Up Recommendations Outpatient PT    Equipment Recommendations  None recommended by PT    Recommendations for Other Services       Precautions / Restrictions Precautions Precautions: Fall Restrictions Weight Bearing Restrictions: No      Mobility  Bed Mobility Overal bed mobility: Independent                  Transfers Overall transfer level: Independent Equipment used: None                Ambulation/Gait Ambulation/Gait assistance: Supervision Gait Distance (Feet): 150 Feet x1, 80 feet x1, 30 feet x1 Assistive device: None Gait Pattern/deviations:  Drifts right/left Gait velocity: decreased   General Gait Details: Inconsistent cadence but steady throughout with no LOB. Pt reports drifting is due to vestibular symptoms she had prior to admission.  Stairs            Wheelchair Mobility    Modified Rankin (Stroke Patients Only)       Balance Overall balance assessment: Independent                                           Pertinent Vitals/Pain Pain Assessment: No/denies pain    Home Living Family/patient expects to be discharged to:: Private residence Living Arrangements: Alone Available Help at Discharge: Friend(s);Neighbor;Available PRN/intermittently Type of Home: House Home Access: Stairs to enter Entrance Stairs-Rails: Right;Left (Cannot reach both) Entrance Stairs-Number of Steps: 2-3 Home Layout: One level Home Equipment: Walker - standard;Bedside commode;Shower seat - built in;Grab bars - tub/shower;Grab bars - toilet;Cane - single point      Prior Function Level of Independence: Independent with assistive device(s)   Gait / Transfers Assistance Needed: Pt ambulates using SPC for uneven terrain but indpendent without AD for household and community distances on even ground  ADL's / Homemaking Assistance Needed: Independent  Comments: Pt has a housekeeper for household chores 2x per wk but independent with all ADLs. Pt reported a bad fall in Sept 2021 tripping on a crack on the sidewalk.     Hand Dominance        Extremity/Trunk Assessment  Upper Extremity Assessment Upper Extremity Assessment: Overall WFL for tasks assessed    Lower Extremity Assessment Lower Extremity Assessment: Overall WFL for tasks assessed    Cervical / Trunk Assessment Cervical / Trunk Assessment: Normal  Communication   Communication: No difficulties  Cognition Arousal/Alertness: Awake/alert Behavior During Therapy: WFL for tasks assessed/performed Overall Cognitive Status: Within Functional  Limits for tasks assessed                                        General Comments      Exercises     Assessment/Plan    PT Assessment Patient needs continued PT services  PT Problem List Decreased activity tolerance       PT Treatment Interventions DME instruction;Gait training;Stair training;Functional mobility training;Therapeutic activities;Therapeutic exercise;Balance training;Patient/family education    PT Goals (Current goals can be found in the Care Plan section)  Acute Rehab PT Goals Patient Stated Goal: Improved endurance PT Goal Formulation: With patient Time For Goal Achievement: 03/08/21 Potential to Achieve Goals: Good    Frequency Min 2X/week   Barriers to discharge        Co-evaluation               AM-PAC PT "6 Clicks" Mobility  Outcome Measure Help needed turning from your back to your side while in a flat bed without using bedrails?: None Help needed moving from lying on your back to sitting on the side of a flat bed without using bedrails?: None Help needed moving to and from a bed to a chair (including a wheelchair)?: None Help needed standing up from a chair using your arms (e.g., wheelchair or bedside chair)?: None Help needed to walk in hospital room?: None Help needed climbing 3-5 steps with a railing? : None 6 Click Score: 24    End of Session Equipment Utilized During Treatment: Gait belt;Oxygen Activity Tolerance: Patient tolerated treatment well Patient left: in chair;with call bell/phone within reach;with chair alarm set Nurse Communication: Mobility status PT Visit Diagnosis: Difficulty in walking, not elsewhere classified (R26.2)    Time: 6270-3500 PT Time Calculation (min) (ACUTE ONLY): 28 min   Charges:              Dayton Scrape SPT 02/23/21, 5:00 PM

## 2021-02-24 ENCOUNTER — Encounter: Payer: Self-pay | Admitting: Internal Medicine

## 2021-02-24 LAB — CBC
HCT: 36.6 % (ref 36.0–46.0)
Hemoglobin: 12.1 g/dL (ref 12.0–15.0)
MCH: 29.5 pg (ref 26.0–34.0)
MCHC: 33.1 g/dL (ref 30.0–36.0)
MCV: 89.3 fL (ref 80.0–100.0)
Platelets: 251 10*3/uL (ref 150–400)
RBC: 4.1 MIL/uL (ref 3.87–5.11)
RDW: 13.8 % (ref 11.5–15.5)
WBC: 5.6 10*3/uL (ref 4.0–10.5)
nRBC: 0 % (ref 0.0–0.2)

## 2021-02-24 LAB — URINE CULTURE

## 2021-02-24 LAB — BASIC METABOLIC PANEL
Anion gap: 10 (ref 5–15)
BUN: 10 mg/dL (ref 8–23)
CO2: 28 mmol/L (ref 22–32)
Calcium: 8.9 mg/dL (ref 8.9–10.3)
Chloride: 103 mmol/L (ref 98–111)
Creatinine, Ser: 0.54 mg/dL (ref 0.44–1.00)
GFR, Estimated: >60 mL/min (ref >60)
Glucose, Bld: 95 mg/dL (ref 70–99)
Potassium: 3.7 mmol/L (ref 3.5–5.1)
Sodium: 141 mmol/L (ref 135–145)

## 2021-02-24 MED ORDER — GUAIFENESIN-DM 100-10 MG/5ML PO SYRP
5.0000 mL | ORAL_SOLUTION | ORAL | Status: DC | PRN
  Administered 2021-02-24 (×2): 5 mL via ORAL
  Filled 2021-02-24 (×2): qty 5

## 2021-02-24 NOTE — Progress Notes (Signed)
SATURATION QUALIFICATIONS: (This note is used to comply with regulatory documentation for home oxygen)  Patient Saturations on Room Air at Rest = 93%  Patient Saturations on Room Air while Ambulating = 90-92%   

## 2021-02-24 NOTE — Progress Notes (Signed)
PROGRESS NOTE  Mandy Jones [facility]  DOB: 1951/05/19  PCP: Loraine Grip, MD KVQ:[id number]  DOA: 02/23/2021  LOS: 1 day  Hospital Day: 2   Chief Complaint  Patient presents with   Fever   Cough   Chest Pain    Brief narrative: Mandy Jones is a 70 y.o. female with PMH significant for A. fib status post pacemaker insertion 5 weeks ago; chronic CHF, hypertension, GERD, depression, chronic bilateral lower extremity pain, gait instability after COVID booster.   Patient presented to the ED on 6/28 for evaluation of a fever of 102, shortness of breath, cough, chest pain.   COVID-19 test at home negative on 6/25.    In the ED, patient had a temperature of 100.2. Labs with WC count normal at 8.1, lactic acid negative, procalcitonin level negative, potassium low at 3.2. Respiratory virus panel negative. CT chest showed nodular infiltrate throughout the left lower lobe, likely infectious, associated with left mediastinal adenopathy.  It also showed multiple 5 mm indeterminate pulmonary nodules within the right upper lobe and right middle lobe.  EKG showed rapid A. fib at the rate of 111 bpm with nonspecific ST-T wave changes.  Patient was started on IV Rocephin, azithromycin and admitted to hospitalist service  Subjective: Patient was seen and examined this morning.  Recent elderly Caucasian female.  Lying in bed.  Feels better than at presentation.  Less cough.  No fever overnight.  On low-flow oxygen.  Assessment/Plan: Community-acquired pneumonia Left lower lobe pneumonia -Presented with 1 week history of intermittent fever, dry cough, chest pain, shortness of breath -CT chest with left lower lobe pneumonia and associated adenopathy. -Currently unclear if bacterial or viral in etiology.  Lactic acid level and procalcitonin level are negative. -Currently improving clinically on IV Rocephin and IV azithromycin. -Pending report of blood culture. -Not making any  sputum. -Low-flow oxygen.  Wean down as tolerated.  Not on supplemental oxygen at home.   Recent Labs  Lab 02/22/21 2226 02/23/21 0111 02/24/21 0620  WBC 8.1  --  5.6  LATICACIDVEN 1.0  --   --   PROCALCITON  --  <0.10  --    A. fib with RVR -Chronic A. fib on amiodarone and Eliquis. -Rate was high initially probably because of fever.  Currently rate controlled.  Chronic diastolic CHF -Currently remains compensated.  Last EF 55 to 60%.  Continue Lasix and Entresto.  Hypokalemia -Improved with replacement. Recent Labs  Lab 02/22/21 2226 02/24/21 0620  K 3.2* 3.7    Hypothyroidism -Continue Synthroid   Morbid obesity  -Body mass index is 41.15 kg/m. Patient has been advised to make an attempt to improve diet and exercise patterns to aid in weight loss.  Mobility: Encourage ambulation.  Outpatient PT suggested Code Status:   Code Status: Full Code  Nutritional status: Body mass index is 41.15 kg/m.     Diet Order             Diet 2 gram sodium Room service appropriate? Yes; Fluid consistency: Thin  Diet effective now                   DVT prophylaxis:   apixaban (ELIQUIS) tablet 5 mg   Antimicrobials: IV Rocephin, azithromycin Fluid: None Consultants: None Family Communication: None  Status is: Inpatient Remains inpatient appropriate because: Continue IV antibiotics  Dispo: The patient is from: Home              Anticipated d/c is to: Home  with outpatient PT likely in 1 to 2 days              Patient currently is not medically stable to d/c.   Difficult to place patient No     Infusions:   azithromycin 500 mg (02/24/21 0100)   cefTRIAXone (ROCEPHIN)  IV 2 g (02/24/21 0857)    Scheduled Meds:  amiodarone  200 mg Oral Daily   apixaban  5 mg Oral BID   vitamin C  1,000 mg Oral QPM   B-complex with vitamin C  1 tablet Oral Daily   cholecalciferol  2,000 Units Oral QHS   cycloSPORINE  1 drop Both Eyes BID   famotidine  40 mg Oral QHS    furosemide  80 mg Oral Daily   levothyroxine  150 mcg Oral QAC breakfast   mouth rinse  15 mL Mouth Rinse BID   potassium chloride  10 mEq Oral BID   sacubitril-valsartan  1 tablet Oral BID    Antimicrobials: Anti-infectives (From admission, onward)    Start     Dose/Rate Route Frequency Ordered Stop   02/24/21 0800  cefTRIAXone (ROCEPHIN) 2 g in sodium chloride 0.9 % 100 mL IVPB        2 g 200 mL/hr over 30 Minutes Intravenous Every 24 hours 02/23/21 0918 03/01/21 0759   02/24/21 0000  azithromycin (ZITHROMAX) 500 mg in sodium chloride 0.9 % 250 mL IVPB        500 mg 250 mL/hr over 60 Minutes Intravenous Every 24 hours 02/23/21 0918 02/28/21 2359   02/23/21 1015  cefTRIAXone (ROCEPHIN) 2 g in sodium chloride 0.9 % 100 mL IVPB  Status:  Discontinued        2 g 200 mL/hr over 30 Minutes Intravenous Every 24 hours 02/23/21 0917 02/23/21 0918   02/23/21 1015  azithromycin (ZITHROMAX) 500 mg in sodium chloride 0.9 % 250 mL IVPB  Status:  Discontinued        500 mg 250 mL/hr over 60 Minutes Intravenous Every 24 hours 02/23/21 0917 02/23/21 0918   02/23/21 0500  cefTRIAXone (ROCEPHIN) 1 g in sodium chloride 0.9 % 100 mL IVPB        1 g 200 mL/hr over 30 Minutes Intravenous  Once 02/23/21 0457 02/23/21 0649   02/23/21 0500  azithromycin (ZITHROMAX) 500 mg in sodium chloride 0.9 % 250 mL IVPB        500 mg 250 mL/hr over 60 Minutes Intravenous  Once 02/23/21 0457 02/23/21 2032       PRN meds: guaiFENesin-dextromethorphan, traZODone   Objective: Vitals:   02/24/21 0349 02/24/21 0747  BP: 130/80 113/76  Pulse: 73 76  Resp: 16 20  Temp: 98.1 F (36.7 C) 97.7 F (36.5 C)  SpO2: 98% 96%    Intake/Output Summary (Last 24 hours) at 02/24/2021 0914 Last data filed at 02/24/2021 0400 Gross per 24 hour  Intake 858.6 ml  Output --  Net 858.6 ml   Filed Weights   02/22/21 2221  Weight: 102.1 kg   Weight change:  Body mass index is 41.15 kg/m.   Physical Exam: General exam:  Pleasant elderly Caucasian female.  Not in distress Skin: No rashes, lesions or ulcers. HEENT: Atraumatic, normocephalic, no obvious bleeding Lungs: Mild scattered bronchial sounds on the left lower lobe. CVS: Regular rate and rhythm, no murmur GI/Abd soft, nontender, nondistended, bowel sound present CNS: Alert, awake, oriented x3 Psychiatry: Mood appropriate Extremities: No pedal edema, no calf tenderness.  Chronic bilateral  lower extremity tenderness on gentle touch.  Data Review: I have personally reviewed the laboratory data and studies available.  Recent Labs  Lab 02/22/21 2226 02/24/21 0620  WBC 8.1 5.6  HGB 13.1 12.1  HCT 39.7 36.6  MCV 88.2 89.3  PLT 256 251   Recent Labs  Lab 02/22/21 2226 02/24/21 0620  NA 137 141  K 3.2* 3.7  CL 103 103  CO2 26 28  GLUCOSE 124* 95  BUN 15 10  CREATININE 0.54 0.54  CALCIUM 8.8* 8.9    F/u labs ordered. Unresulted Labs (From admission, onward)     Start     Ordered   Unscheduled  CBC with Differential/Platelet  Daily,   R     Question:  Specimen collection method  Answer:  Lab=Lab collect   02/24/21 0914   Unscheduled  Basic metabolic panel  Daily,   R     Question:  Specimen collection method  Answer:  Lab=Lab collect   02/24/21 0914   Unscheduled  Phosphorus  Tomorrow morning,   R       Question:  Specimen collection method  Answer:  Lab=Lab collect   02/24/21 0914   Unscheduled  Magnesium  Tomorrow morning,   STAT       Question:  Specimen collection method  Answer:  Lab=Lab collect   02/24/21 0914            Signed, Terrilee Croak, MD [facility] 02/24/2021

## 2021-02-25 LAB — BASIC METABOLIC PANEL
Anion gap: 9 (ref 5–15)
BUN: 11 mg/dL (ref 8–23)
CO2: 31 mmol/L (ref 22–32)
Calcium: 9.4 mg/dL (ref 8.9–10.3)
Chloride: 102 mmol/L (ref 98–111)
Creatinine, Ser: 0.62 mg/dL (ref 0.44–1.00)
GFR, Estimated: >60 mL/min (ref >60)
Glucose, Bld: 103 mg/dL — ABNORMAL HIGH (ref 70–99)
Potassium: 4.1 mmol/L (ref 3.5–5.1)
Sodium: 142 mmol/L (ref 135–145)

## 2021-02-25 LAB — CBC WITH DIFFERENTIAL/PLATELET
Abs Immature Granulocytes: 0.16 10*3/uL — ABNORMAL HIGH (ref 0.00–0.07)
Basophils Absolute: 0 10*3/uL (ref 0.0–0.1)
Basophils Relative: 1 %
Eosinophils Absolute: 0.2 10*3/uL (ref 0.0–0.5)
Eosinophils Relative: 3 %
HCT: 40.2 % (ref 36.0–46.0)
Hemoglobin: 13.5 g/dL (ref 12.0–15.0)
Immature Granulocytes: 2 %
Lymphocytes Relative: 25 %
Lymphs Abs: 1.6 10*3/uL (ref 0.7–4.0)
MCH: 29.3 pg (ref 26.0–34.0)
MCHC: 33.6 g/dL (ref 30.0–36.0)
MCV: 87.2 fL (ref 80.0–100.0)
Monocytes Absolute: 0.5 10*3/uL (ref 0.1–1.0)
Monocytes Relative: 7 %
Neutro Abs: 4.1 10*3/uL (ref 1.7–7.7)
Neutrophils Relative %: 62 %
Platelets: 270 10*3/uL (ref 150–400)
RBC: 4.61 MIL/uL (ref 3.87–5.11)
RDW: 13.7 % (ref 11.5–15.5)
WBC: 6.6 10*3/uL (ref 4.0–10.5)
nRBC: 0 % (ref 0.0–0.2)

## 2021-02-25 LAB — GLUCOSE, CAPILLARY: Glucose-Capillary: 105 mg/dL — ABNORMAL HIGH (ref 70–99)

## 2021-02-25 LAB — PHOSPHORUS: Phosphorus: 3.8 mg/dL (ref 2.5–4.6)

## 2021-02-25 LAB — MAGNESIUM: Magnesium: 2.1 mg/dL (ref 1.7–2.4)

## 2021-02-25 MED ORDER — CEFDINIR 300 MG PO CAPS: 300.0000 mg | ORAL_CAPSULE | Freq: Two times a day (BID) | ORAL | 0 refills | Status: AC

## 2021-02-25 MED ORDER — SACCHAROMYCES BOULARDII 250 MG PO CAPS: 250.0000 mg | ORAL_CAPSULE | Freq: Two times a day (BID) | ORAL | 0 refills | Status: AC

## 2021-02-25 MED ORDER — GUAIFENESIN-DM 100-10 MG/5ML PO SYRP
5.0000 mL | ORAL_SOLUTION | ORAL | 0 refills | Status: DC | PRN
Start: 2021-02-25 — End: 2021-07-15

## 2021-02-25 NOTE — Discharge Summary (Signed)
Physician Discharge Summary  Xiana Carns [facility] KPT:[id number] DOB: 03/31/1951 DOA: 02/23/2021  PCP: Loraine Grip, MD  Admit date: 02/23/2021 Discharge date: 02/25/2021  Admitted From: Home Discharge disposition: Home with outpatient PT   Code Status: Full Code  Diet Recommendation: Cardiac diet  Discharge Diagnosis:   Principal Problem:   CAP (community acquired pneumonia) Active Problems:   Atrial fibrillation with RVR ([place])   Hypothyroidism   Chronic diastolic CHF (congestive heart failure) (HCC)   Hypokalemia   Obesity, Class III, BMI 40-49.9 (morbid obesity) ([place])   Chief Complaint  Patient presents with   Fever   Cough   Chest Pain    Brief narrative: Mandy Jones is a 70 y.o. female with PMH significant for A. fib status post pacemaker insertion 5 weeks ago; chronic CHF, hypertension, GERD, depression, chronic bilateral lower extremity pain, gait instability after COVID booster.   Patient presented to the ED on 6/28 for evaluation of a fever of 102, shortness of breath, cough, chest pain.   COVID-19 test at home negative on 6/25.    In the ED, patient had a temperature of 100.2. Labs with WC count normal at 8.1, lactic acid negative, procalcitonin level negative, potassium low at 3.2. Respiratory virus panel negative. CT chest showed nodular infiltrate throughout the left lower lobe, likely infectious, associated with left mediastinal adenopathy.  It also showed multiple 5 mm indeterminate pulmonary nodules within the right upper lobe and right middle lobe.  EKG showed rapid A. fib at the rate of 111 bpm with nonspecific ST-T wave changes.  Patient was started on IV Rocephin, azithromycin and admitted to hospitalist service  Subjective: Patient was seen and examined this morning.  Feels much better.  Not on supplemental oxygen.  No cough.  Definitely wants to go home.  Assessment/Plan: Community-acquired pneumonia Left lower lobe  pneumonia -Presented with 1 week history of intermittent fever, dry cough, chest pain, shortness of breath -CT chest with left lower lobe pneumonia and associated adenopathy. -Currently unclear if bacterial or viral in etiology.  Lactic acid level and procalcitonin level are negative. -Currently improving clinically on IV Rocephin and IV azithromycin. -No growth in blood cultures so far.  Not make any phlegm. -She will require supplemental oxygen.  Currently not requiring oxygen.  Able to ambulate without oxygen and maintain saturation. -We will discharge her home oral Omnicef for next 7 days with probiotics. -To follow-up with PCP/pulmonology next 4 weeks for repeat chest x-ray/CT chest. Recent Labs  Lab 02/22/21 2226 02/23/21 0111 02/24/21 0620 02/25/21 0446  WBC 8.1  --  5.6 6.6  LATICACIDVEN 1.0  --   --   --   PROCALCITON  --  <0.10  --   --   A. fib with RVR -Chronic A. fib on amiodarone and Eliquis. -Rate was high initially probably because of fever.  Currently rate controlled.  Chronic diastolic CHF -Currently remains compensated.  Last EF 55 to 60%.  Continue Lasix and Entresto.  Hypokalemia -Improved with replacement. Recent Labs  Lab 02/22/21 2226 02/24/21 0620 02/25/21 0446  K 3.2* 3.7 4.1  MG  --   --  2.1  PHOS  --   --  3.8   Hypothyroidism -Continue Synthroid   Morbid obesity  -Body mass index is 41.15 kg/m. Patient has been advised to make an attempt to improve diet and exercise patterns to aid in weight loss.   Wound care:    Discharge Exam:   Vitals:   02/25/21 0351  02/25/21 0547 02/25/21 0604 02/25/21 0643  BP:  140/83    Pulse:  70    Resp: 17 17 14 18   Temp:  97.9 F (36.6 C)    TempSrc:      SpO2:  98%    Weight:      Height:        Body mass index is 41.15 kg/m.  General exam: Pleasant, elderly Caucasian female.  Not in distress. Skin: No rashes, lesions or ulcers. HEENT: Atraumatic, normocephalic, no obvious bleeding Lungs:  Clear to auscultation bilaterally.  No wheezing or crackles. CVS: Regular rate and rhythm, no murmur GI/Abd soft, nontender, nondistended, bowel sound present CNS: Alert, awake, oriented x3 Psychiatry: Mood appropriate Extremities: No pedal edema, no calf tenderness  Follow ups:   Discharge Instructions     Diet - low sodium heart healthy   Complete by: As directed    Increase activity slowly   Complete by: As directed        Follow-up Information     Loraine Grip, MD Follow up.   Specialty: Family Medicine Contact information: [address] [facility] [address] [place] [address] [phone]                 Recommendations for Outpatient Follow-Up:   Follow-up with PCP/pulmonology as an outpatient  Discharge Instructions:  Follow with Primary MD Loraine Grip, MD in 7 days   Get CBC/BMP checked in next visit within 1 week by PCP or SNF MD ( we routinely change or add medications that can affect your baseline labs and fluid status, therefore we recommend that you get the mentioned basic workup next visit with your PCP, your PCP may decide not to get them or add new tests based on their clinical decision)  On your next visit with your PCP, please Get Medicines reviewed and adjusted.  Please request your PCP  to go over all Hospital Tests and Procedure/Radiological results at the follow up, please get all Hospital records sent to your Prim MD by signing hospital release before you go home.  Activity: As tolerated with Full fall precautions use walker/cane & assistance as needed  For Heart failure patients - Check your Weight same time everyday, if you gain over 2 pounds, or you develop in leg swelling, experience more shortness of breath or chest pain, call your Primary MD immediately. Follow Cardiac Low Salt Diet and 1.5 lit/day fluid restriction.  If you have smoked or chewed Tobacco in the last 2 yrs please stop smoking, stop any regular Alcohol  and or any  Recreational drug use.  If you experience worsening of your admission symptoms, develop shortness of breath, life threatening emergency, suicidal or homicidal thoughts you must seek medical attention immediately by calling 911 or calling your MD immediately  if symptoms less severe.  You Must read complete instructions/literature along with all the possible adverse reactions/side effects for all the Medicines you take and that have been prescribed to you. Take any new Medicines after you have completely understood and accpet all the possible adverse reactions/side effects.   Do not drive, operate heavy machinery, perform activities at heights, swimming or participation in water activities or provide baby sitting services if your were admitted for syncope or siezures until you have seen by Primary MD or a Neurologist and advised to do so again.  Do not drive when taking Pain medications.  Do not take more than prescribed Pain, Sleep and Anxiety Medications  Wear Seat belts while  driving.   Please note You were cared for by a hospitalist during your hospital stay. If you have any questions about your discharge medications or the care you received while you were in the hospital after you are discharged, you can call the unit and asked to speak with the hospitalist on call if the hospitalist that took care of you is not available. Once you are discharged, your primary care physician will handle any further medical issues. Please note that NO REFILLS for any discharge medications will be authorized once you are discharged, as it is imperative that you return to your primary care physician (or establish a relationship with a primary care physician if you do not have one) for your aftercare needs so that they can reassess your need for medications and monitor your lab values.    Time coordinating discharge: 35 minutes  Allergies as of 02/25/2021       Reactions   Tape Rash   Pads used for ablation  left a rash for a couple of days.        Medication List     TAKE these medications    amiodarone 200 MG tablet Commonly known as: PACERONE Take 1 tablet (200 mg total) by mouth daily.   apixaban 5 MG Tabs tablet Commonly known as: ELIQUIS Take 1 tablet (5 mg total) by mouth 2 (two) times daily.   B-complex with vitamin C tablet Take 1 tablet by mouth daily.   CALCIUM MAGNESIUM PO Take 1 tablet by mouth daily.   cefdinir 300 MG capsule Commonly known as: OMNICEF Take 1 capsule (300 mg total) by mouth 2 (two) times daily for 7 days.   cycloSPORINE 0.05 % ophthalmic emulsion Commonly known as: RESTASIS Place 1 drop into both eyes 2 (two) times daily.   Entresto 24-26 MG Generic drug: sacubitril-valsartan Take 1 tablet by mouth 2 (two) times daily. Needs appt for further refills or request goes to [facility]   famotidine 20 MG tablet Commonly known as: PEPCID Take 40 mg by mouth at bedtime.   furosemide 40 MG tablet Commonly known as: LASIX Take 1 tablet (40 mg total) by mouth 2 (two) times daily. What changed:  how much to take when to take this   guaiFENesin-dextromethorphan 100-10 MG/5ML syrup Commonly known as: ROBITUSSIN DM Take 5 mLs by mouth every 4 (four) hours as needed for cough.   levothyroxine 150 MCG tablet Commonly known as: SYNTHROID Take 150 mcg by mouth daily before breakfast.   Lutein-Zeaxanthin Tabs Take 1 tablet by mouth in the morning and at bedtime.   melatonin 5 MG Tabs Take 5 mg by mouth at bedtime as needed.   Osteo Bi-Flex One Per Day Tabs Take 1 tablet by mouth daily.   potassium chloride 10 MEQ tablet Commonly known as: KLOR-CON Take 10 mEq by mouth 2 (two) times daily.   Red Yeast Rice Extract 600 MG Caps Take 600 mg by mouth every evening.   saccharomyces boulardii 250 MG capsule Commonly known as: FLORASTOR Take 1 capsule (250 mg total) by mouth 2 (two) times daily for 7 days.   traZODone 50 MG tablet Commonly  known as: DESYREL Take 50 mg by mouth at bedtime as needed for sleep.   valACYclovir 1000 MG tablet Commonly known as: VALTREX Take 2,000 mg by mouth as directed. For breakout   vitamin C 1000 MG tablet Take 1,000 mg by mouth every evening.   Vitamin D3 50 MCG (2000 UT) Tabs Take 2,000  Units by mouth at bedtime.        The results of significant diagnostics from this hospitalization (including imaging, microbiology, ancillary and laboratory) are listed below for reference.    Procedures and Diagnostic Studies:   CT Chest Wo Contrast  Result Date: 02/23/2021 CLINICAL DATA:  Chest pain, cough, fever, dyspnea EXAM: CT CHEST WITHOUT CONTRAST TECHNIQUE: Multidetector CT imaging of the chest was performed following the standard protocol without IV contrast. COMPARISON:  None. FINDINGS: Cardiovascular: Moderate coronary artery calcification largely within the left main and left anterior descending coronary arteries. Mild cardiomegaly. Left subclavian pacemaker leads are seen within the right atrium and right ventricle. No pericardial effusion. The central pulmonary arteries are enlarged in keeping with changes of pulmonary artery hypertension. Mild atherosclerotic calcification within the thoracic aorta. No aortic aneurysm. Mediastinum/Nodes: The thyroid gland is absent. There is shotty prevascular and aortopulmonary window lymphadenopathy, possibly reactive in nature. No additional pathologic thoracic adenopathy. The esophagus is unremarkable. Lungs/Pleura: There is nodular infiltrates scattered throughout the left lower lobe, possibly infectious in the acute setting. Minimal ground-glass infiltrate is also identified within the right lower lobe. 5 mm noncalcified subpleural pulmonary nodule is seen within the right middle lobe, axial image # 57/3, indeterminate. 5 mm indeterminate pulmonary nodule is seen within the right upper lobe, axial image # 44/3, indeterminate. No pneumothorax or pleural  effusion. Central airways are widely patent. 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 Nodular thickening of the left adrenal gland is unchanged. No acute abnormality within the visualized upper abdomen. Musculoskeletal: No acute bone abnormality. No lytic or blastic bone lesion identified. IMPRESSION: Nodular infiltrate throughout the left lower lobe, likely infectious in the acute setting with associated left mediastinal adenopathy. Multiple 5 mm indeterminate pulmonary nodules within the right upper lobe and right middle lobe. No follow-up needed if patient is low-risk (and has no known or suspected primary neoplasm). Non-contrast chest CT can be considered in 12 months if patient is high-risk. This recommendation follows the consensus statement: Guidelines for Management of Incidental Pulmonary Nodules Detected on CT Images: From the Fleischner Society 2017; Radiology 2017; 284:228-243. Moderate coronary artery calcification. Mild cardiomegaly. Morphologic changes in keeping with pulmonary arterial hypertension. Aortic Atherosclerosis (ICD10-I70.0). Electronically Signed   By: Fidela Salisbury MD   On: 02/23/2021 04:50     Labs:   Basic Metabolic Panel: Recent Labs  Lab 02/22/21 2226 02/24/21 0620 02/25/21 0446  NA 137 141 142  K 3.2* 3.7 4.1  CL 103 103 102  CO2 26 28 31   GLUCOSE 124* 95 103*  BUN 15 10 11   CREATININE 0.54 0.54 0.62  CALCIUM 8.8* 8.9 9.4  MG  --   --  2.1  PHOS  --   --  3.8   GFR Estimated Creatinine Clearance: 71.1 mL/min (by C-G formula based on SCr of 0.62 mg/dL). Liver Function Tests: Recent Labs  Lab 02/22/21 2226  AST 17  ALT 23  ALKPHOS 110  BILITOT 0.8  PROT 7.0  ALBUMIN 3.7   No results for input(s): LIPASE, AMYLASE in the last 168 hours. No results for input(s): AMMONIA in the last 168 hours. Coagulation profile No results for  input(s): INR, PROTIME in the last 168 hours.  CBC: Recent Labs  Lab 02/22/21 2226 02/24/21 0620 02/25/21 0446  WBC 8.1 5.6 6.6  NEUTROABS  --   --  4.1  HGB 13.1  12.1 13.5  HCT 39.7 36.6 40.2  MCV 88.2 89.3 87.2  PLT 256 251 270   Cardiac Enzymes: No results for input(s): CKTOTAL, CKMB, CKMBINDEX, TROPONINI in the last 168 hours. BNP: Invalid input(s): POCBNP CBG: No results for input(s): GLUCAP in the last 168 hours. D-Dimer No results for input(s): DDIMER in the last 72 hours. Hgb A1c No results for input(s): HGBA1C in the last 72 hours. Lipid Profile No results for input(s): CHOL, HDL, LDLCALC, TRIG, CHOLHDL, LDLDIRECT in the last 72 hours. Thyroid function studies No results for input(s): TSH, T4TOTAL, T3FREE, THYROIDAB in the last 72 hours.  Invalid input(s): FREET3 Anemia work up No results for input(s): VITAMINB12, FOLATE, FERRITIN, TIBC, IRON, RETICCTPCT in the last 72 hours. Microbiology Recent Results (from the past 240 hour(s))  Urine Culture     Status: Abnormal   Collection Time: 02/23/21  1:11 AM   Specimen: Urine, Random  Result Value Ref Range Status   Specimen Description   Final    URINE, RANDOM Performed at [facility], [address], [place], [place] [address]    Special Requests   Final    NONE Performed at [facility], [address] [place], [place] [address]    Culture MULTIPLE SPECIES PRESENT, SUGGEST RECOLLECTION (A)  Final   Report Status 02/24/2021 FINAL  Final  Culture, blood (Routine X 2) w Reflex to ID Panel     Status: None (Preliminary result)   Collection Time: 02/23/21  1:11 AM   Specimen: BLOOD  Result Value Ref Range Status   Specimen Description BLOOD BLOOD LEFT FOREARM  Final   Special Requests   Final    BOTTLES DRAWN AEROBIC AND ANAEROBIC Blood Culture adequate volume   Culture   Final    NO GROWTH 2 DAYS Performed at [facility], [address], [place], [place] [address]     Report Status PENDING  Incomplete  Resp Panel by RT-PCR (Flu A&B, Covid) Nasopharyngeal Swab     Status: None   Collection Time: 02/23/21  1:11 AM   Specimen: Nasopharyngeal Swab; Nasopharyngeal(NP) swabs in vial transport medium  Result Value Ref Range Status   SARS Coronavirus 2 by RT PCR NEGATIVE NEGATIVE Final    Comment: (NOTE) SARS-CoV-2 target nucleic acids are NOT DETECTED.  The SARS-CoV-2 RNA is generally detectable in upper respiratory specimens during the acute phase of infection. The lowest concentration of SARS-CoV-2 viral copies this assay can detect is 138 copies/mL. A negative result does not preclude SARS-Cov-2 infection and should not be used as the sole basis for treatment or other patient management decisions. A negative result may occur with  improper specimen collection/handling, submission of specimen other than nasopharyngeal swab, presence of viral mutation(s) within the areas targeted by this assay, and inadequate number of viral copies(<138 copies/mL). A negative result must be combined with clinical observations, patient history, and epidemiological information. The expected result is Negative.  Fact Sheet for Patients:  EntrepreneurPulse.com.au  Fact Sheet for Healthcare Providers:  IncredibleEmployment.be  This test is no t yet approved or cleared by the [place] FDA and  has been authorized for detection and/or diagnosis of SARS-CoV-2 by FDA under an Emergency Use Authorization (EUA). This EUA will remain  in effect (meaning this test can be used) for the duration of the COVID-19 declaration under Section 564(b)(1) of the Act, 21 U.S.C.section 360bbb-3(b)(1), unless the authorization is terminated  or revoked sooner.       Influenza A by  PCR NEGATIVE NEGATIVE Final   Influenza B by PCR NEGATIVE NEGATIVE Final    Comment: (NOTE) The Xpert Xpress SARS-CoV-2/FLU/RSV plus assay is intended as an aid in the  diagnosis of influenza from Nasopharyngeal swab specimens and should not be used as a sole basis for treatment. Nasal washings and aspirates are unacceptable for Xpert Xpress SARS-CoV-2/FLU/RSV testing.  Fact Sheet for Patients: EntrepreneurPulse.com.au  Fact Sheet for Healthcare Providers: IncredibleEmployment.be  This test is not yet approved or cleared by the [place] FDA and has been authorized for detection and/or diagnosis of SARS-CoV-2 by FDA under an Emergency Use Authorization (EUA). This EUA will remain in effect (meaning this test can be used) for the duration of the COVID-19 declaration under Section 564(b)(1) of the Act, 21 U.S.C. section 360bbb-3(b)(1), unless the authorization is terminated or revoked.  Performed at [facility], [address] [place], [place] [address]   Culture, blood (Routine X 2) w Reflex to ID Panel     Status: None (Preliminary result)   Collection Time: 02/23/21  8:49 AM   Specimen: BLOOD LEFT HAND  Result Value Ref Range Status   Specimen Description BLOOD LEFT HAND  Final   Special Requests   Final    BOTTLES DRAWN AEROBIC AND ANAEROBIC Blood Culture adequate volume   Culture   Final    NO GROWTH 2 DAYS Performed at [facility], [address], [place], [place] [address]    Report Status PENDING  Incomplete     Signed: Terrilee Croak  [facility] 02/25/2021, 10:08 AM

## 2021-02-25 NOTE — TOC Transition Note (Signed)
Transition of Care [facility]) - CM/SW Discharge Note   Patient Details  Name: Mandy Jones MRN: [id number] Date of Birth: 06-16-1951  [facility] CM/SW Contact:  Shelbie Hutching, RN Phone Number: 02/25/2021, 11:27 AM   Clinical Narrative:    Patient is medically cleared for discharge home today.  Patient was going to OP Therapy before coming into the hospital and PT recommends that patient continue OP PT, patient agrees.  Patient will drive herself home today her car is outside the emergency department in the parking lot.      Final next level of care: OP Rehab Barriers to Discharge: Barriers Resolved   Patient Goals and CMS Choice Patient states their goals for this hospitalization and ongoing recovery are:: Hopes to go home tomorrow      Discharge Placement                       Discharge Plan and Services   Discharge Planning Services: CM Consult            DME Arranged: N/A DME Agency: NA       HH Arranged: NA          Social Determinants of Health (SDOH) Interventions     Readmission Risk Interventions No flowsheet data found.

## 2021-02-25 NOTE — Progress Notes (Signed)
Mandy Jones to be D/C'd Home per MD order.  Discussed prescriptions and follow up appointments with the patient. Prescriptions given to patient, medication list explained in detail. Pt verbalized understanding.  Allergies as of 02/25/2021       Reactions   Tape Rash   Pads used for ablation left a rash for a couple of days.        Medication List     TAKE these medications    amiodarone 200 MG tablet Commonly known as: PACERONE Take 1 tablet (200 mg total) by mouth daily.   apixaban 5 MG Tabs tablet Commonly known as: ELIQUIS Take 1 tablet (5 mg total) by mouth 2 (two) times daily.   B-complex with vitamin C tablet Take 1 tablet by mouth daily.   CALCIUM MAGNESIUM PO Take 1 tablet by mouth daily.   cefdinir 300 MG capsule Commonly known as: OMNICEF Take 1 capsule (300 mg total) by mouth 2 (two) times daily for 7 days.   cycloSPORINE 0.05 % ophthalmic emulsion Commonly known as: RESTASIS Place 1 drop into both eyes 2 (two) times daily.   Entresto 24-26 MG Generic drug: sacubitril-valsartan Take 1 tablet by mouth 2 (two) times daily. Needs appt for further refills or request goes to [facility]   famotidine 20 MG tablet Commonly known as: PEPCID Take 40 mg by mouth at bedtime.   furosemide 40 MG tablet Commonly known as: LASIX Take 1 tablet (40 mg total) by mouth 2 (two) times daily. What changed:  how much to take when to take this   guaiFENesin-dextromethorphan 100-10 MG/5ML syrup Commonly known as: ROBITUSSIN DM Take 5 mLs by mouth every 4 (four) hours as needed for cough.   levothyroxine 150 MCG tablet Commonly known as: SYNTHROID Take 150 mcg by mouth daily before breakfast.   Lutein-Zeaxanthin Tabs Take 1 tablet by mouth in the morning and at bedtime.   melatonin 5 MG Tabs Take 5 mg by mouth at bedtime as needed.   Osteo Bi-Flex One Per Day Tabs Take 1 tablet by mouth daily.   potassium chloride 10 MEQ tablet Commonly known as:  KLOR-CON Take 10 mEq by mouth 2 (two) times daily.   Red Yeast Rice Extract 600 MG Caps Take 600 mg by mouth every evening.   saccharomyces boulardii 250 MG capsule Commonly known as: FLORASTOR Take 1 capsule (250 mg total) by mouth 2 (two) times daily for 7 days.   traZODone 50 MG tablet Commonly known as: DESYREL Take 50 mg by mouth at bedtime as needed for sleep.   valACYclovir 1000 MG tablet Commonly known as: VALTREX Take 2,000 mg by mouth as directed. For breakout   vitamin C 1000 MG tablet Take 1,000 mg by mouth every evening.   Vitamin D3 50 MCG (2000 UT) Tabs Take 2,000 Units by mouth at bedtime.        Vitals:   02/25/21 0643 02/25/21 1139  BP:  135/71  Pulse:  79  Resp: 18 17  Temp:  98.4 F (36.9 C)  SpO2:  93%    Tele box removed and returned. Skin clean, dry and intact without evidence of skin break down, no evidence of skin tears noted. IV catheter discontinued intact. Site without signs and symptoms of complications. Dressing and pressure applied. Pt denies pain at this time. No complaints noted.  An After Visit Summary was printed and given to the patient. Patient escorted via [place], and D/C home via private auto.  Rolley Sims

## 2021-03-02 ENCOUNTER — Other Ambulatory Visit: Payer: Self-pay

## 2021-03-02 ENCOUNTER — Ambulatory Visit: Payer: Medicare HMO | Attending: Otolaryngology

## 2021-03-02 VITALS — BP 114/63 | HR 85

## 2021-03-02 DIAGNOSIS — R2681 Unsteadiness on feet: Secondary | ICD-10-CM | POA: Diagnosis present

## 2021-03-02 LAB — CULTURE, BLOOD (ROUTINE X 2)
Culture: NO GROWTH
Culture: NO GROWTH
Special Requests: ADEQUATE
Special Requests: ADEQUATE

## 2021-03-02 NOTE — Therapy (Signed)
[facility] [address] [place], [place], [address] Phone: [phone]   Fax:  [phone]  Physical Therapy Treatment  Patient Details  Name: Mandy Jones [facility] MRN: [id number] Date of Birth: 01/29/1951 Referring Provider (PT): Carloyn Manner   Encounter Date: 03/02/2021   PT End of Session - 03/02/21 1621     Visit Number 11    Number of Visits 33    Date for PT Re-Evaluation 04/01/21    Authorization Type eval: 11/28/91, recert: 03/06/01    PT Start Time 4097    PT Stop Time 1700    PT Time Calculation (min) 43 min    Activity Tolerance Patient tolerated treatment well    Behavior During Therapy [facility] for tasks assessed/performed             Past Medical History:  Diagnosis Date   Arrhythmia    atrial fibrillation   Arthritis    CHF (congestive heart failure) ([place])    Depression    Dyspnea    Dysrhythmia    Fibromyalgia 1992   GERD (gastroesophageal reflux disease)    Hepatitis    Hepatitis B 1985   Hyperlipidemia    Hypertension    Mitral valve prolapse    Pacemaker    Pacemaker 01/13/2021   Sleep apnea    CPAP   Tendon tear, ankle    rt foot   Thyroid dysfunction     Past Surgical History:  Procedure Laterality Date   ACHILLES TENDON SURGERY Right 04/06/2018   Procedure: ACHILLES TENDON REPAIR;  Surgeon: [name], DPM;  Location: [facility];  Service: Podiatry;  Laterality: Right;   CARDIOVERSION N/A 02/06/2018   Procedure: CARDIOVERSION;  Surgeon: Corey Skains, MD;  Location: [facility];  Service: Cardiovascular;  Laterality: N/A;   CARDIOVERSION N/A 03/28/2018   Procedure: CARDIOVERSION;  Surgeon: Corey Skains, MD;  Location: [facility];  Service: Cardiovascular;  Laterality: N/A;   CARDIOVERSION N/A 05/09/2018   Procedure: CARDIOVERSION;  Surgeon: Corey Skains, MD;  Location: [facility];  Service: Cardiovascular;  Laterality: N/A;   CARDIOVERSION N/A 10/23/2019   Procedure:  CARDIOVERSION;  Surgeon: Corey Skains, MD;  Location: [facility];  Service: Cardiovascular;  Laterality: N/A;   CARDIOVERSION N/A 09/30/2020   Procedure: CARDIOVERSION;  Surgeon: Corey Skains, MD;  Location: [facility];  Service: Cardiovascular;  Laterality: N/A;   CARDIOVERSION N/A 11/19/2020   Procedure: CARDIOVERSION;  Surgeon: Corey Skains, MD;  Location: [facility];  Service: Cardiovascular;  Laterality: N/A;   CHOLECYSTECTOMY     CONTINUOUS NERVE MONITORING N/A 08/03/2017   Procedure: LARYNGEAL NERVE MONITORING;  Surgeon: Carloyn Manner, MD;  Location: [facility];  Service: ENT;  Laterality: N/A;   EXTRACORPOREAL SHOCK WAVE LITHOTRIPSY Right 02/13/2020   Procedure: EXTRACORPOREAL SHOCK WAVE LITHOTRIPSY (ESWL);  Surgeon: Billey Co, MD;  Location: [facility];  Service: Urology;  Laterality: Right;   EYE SURGERY Bilateral    FOOT SURGERY Left 2000   JOINT REPLACEMENT     left knee   OSTECTOMY Right 04/06/2018   Procedure: OSTECTOMY-HAGLUNDS/RECTROCALCANEAL;  Surgeon: [name], DPM;  Location: [facility];  Service: Podiatry;  Laterality: Right;   SHOULDER ARTHROSCOPY Bilateral    SHOULDER ARTHROSCOPY Left    THYROIDECTOMY N/A 08/03/2017   Procedure: THYROIDECTOMY;  Surgeon: Carloyn Manner, MD;  Location: [facility];  Service: ENT;  Laterality: N/A;   TONSILLECTOMY     TUBAL LIGATION      Vitals:  03/02/21 1622  BP: 114/63  Pulse: 85  SpO2: 96%     Subjective Assessment - 03/02/21 1620     Subjective Pt reports that she is doing alright. She had an admission at [facility] for a fever related to PNA. She was discharged on 02/25/21. No specific questions currently.    Pertinent History Pt referred to vestibular therapy by Dr. Pryor Ochoa for dizziness. She reports "my balance is off and feel wobbly." Her dizziness started after a fall in September 2021 where she hit the left side of her head and face. She was not diagnosed with a concussion at the time of her injury. She denies any  excessive fatigue, difficulty concentrating, headaches, nausea, blurred vision, emotional lability, or disrupted sleep after her fall. She does complain of bilateral tinnitus which started a couple months after the fall. She was seen by Dr. Pryor Ochoa on 10/23/20 who referred her for a VNG study. VNG was positive for abnormal smooth pursuits, saccades, and optokinetic testing. She also had 10 degrees of right beating nystagmus in the body left position and six degrees of left beating nystagmus in the body right position. Calorics were symmetrical. 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 History of cataract removal and macular degeneration. Due to central findings on VNG she was referred by [facility] to neurology but has not yet received a call to schedule. She had a R knee replacement in May 2021 and reports that she has osteoarthritis in both feet. History of CHF with fluctuating weight. She had a cardioversion two weeks ago. Possible ablation next month. 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 Pt has bilateral carpal tunnel which causes numbness in her hands. No numbness in the feet. Her primary complaint at this time is difficulty with her balance.    Limitations Walking    Diagnostic tests See history    Patient Stated Goals Improve balance so she can train and show her dogs with more confidence    Currently in Pain? No/denies                TREATMENT    Ther-ex  Seated LAQ with 3# ankle weights x 10 BLE;  Standing hip strengthening with 3# ankle weights: Hip flexion marches x 10 BLE; HS curls x 10 BLE; Hip abduction x 10 BLE; Hip extension x 0 BLE;  Sit to stand from regular height chair with Airex pad on top without UE support 2 x 10; Mini squats with UE support x 10, cues for proper hip hinge;    Neuromuscular Re-education  All balance exercises performed without UE  support unless otherwise noted;   Firm tandem balance alternating forward LE 2 x 30s each; Firm tandem balance alternating forward LE horizontal head turns x 30s each; Tandem gait in // bars x 4 lengths; Airex alternating 6" cone taps x 10 each     Pt educated throughout session about proper posture and technique with exercises. Improved exercise technique, movement at target joints, use of target muscles after min to mod verbal, visual, tactile cues.      Patient demonstrates excellent motivation during session today.  Checked her vitals and heart rate is much better controlled today compared to previous sessions.  She has an appointment to see the heart failure clinic tomorrow.  Patient encouraged to discuss the possibility of cardiac/pulmonary rehab with her physicians.  Continue with strengthening and balance exercises  during session today.  Monitored vitals intermittently during session and SPO2 drops occasionally to 94% but recovers to 96% or greater within 60 seconds.  Patient encouraged to continue her HEP and follow-up as scheduled.  She would benefit from additional physical therapy services to address deficits in strength and balance in order to return to full function at home.                             PT Short Term Goals - 02/17/21 1122       PT SHORT TERM GOAL #1   Title Pt will be independent with HEP in order to improve strength and balance in order to decrease fall risk and improve function at home and when training her dogs    Time 4    Period Weeks    Status On-going    Target Date 03/04/21               PT Long Term Goals - 02/17/21 1122       PT LONG TERM GOAL #1   Title Pt will improve BERG by at least 3 points in order to demonstrate clinically significant improvement in balance.    Baseline 11/30/20: To be completed at next appointment; 12/04/20: 49/56; 02/04/21: 53/56;    Time 12    Period Weeks    Status Achieved      PT LONG TERM  GOAL #2   Title Pt will improve ABC by at least 13% in order to demonstrate clinically significant improvement in balance confidence.    Baseline 11/30/20: 67.5%; 02/04/21: 65.625%    Time 12    Period Weeks    Status On-going    Target Date 04/01/21      PT LONG TERM GOAL #3   Title Pt will improve FOTO to at least 55 in order to demonstrate clinically significant improvement in function related to balance and decrease her risk for falls.    Baseline 11/30/20: 46; 02/04/21: 49    Time 12    Period Weeks    Status Partially Met    Target Date 04/01/21      PT LONG TERM GOAL #4   Title Pt will demonstrate at least 4 point improvement in the FGA in order to demonstrate significant improvement in balance to decrease her fall risk at home and improve safety at home    Baseline 12/03/20:18/30; 02/04/21: 20/30;    Time 12    Period Weeks    Status Partially Met    Target Date 04/01/21      PT LONG TERM GOAL #5   Title Pt will increase self-selected 10MWT by at least 0.13 m/s in order to demonstrate clinically significant improvement in community ambulation.    Baseline 12/03/20: 11.2s = 0.48ms; 02/04/21: 0.76 m/s    Time 12    Period Weeks    Status On-going    Target Date 04/01/21                   Plan - 03/02/21 1642     Clinical Impression Statement Patient demonstrates excellent motivation during session today.  Checked her vitals and heart rate is much better controlled today compared to previous sessions.  She has an appointment to see the heart failure clinic tomorrow.  Patient encouraged to discuss the possibility of cardiac/pulmonary rehab with her physicians.  Continue with strengthening and balance exercises during session today.  Monitored vitals  intermittently during session and SPO2 drops occasionally to 94% but recovers to 96% or greater within 60 seconds.  Patient encouraged to continue her HEP and follow-up as scheduled.  She would benefit from additional physical therapy  services to address deficits in strength and balance in order to return to full function at home.    Personal Factors and Comorbidities Age;Comorbidity 3+    Comorbidities CHF, A-fib, depression, fibromyalgia, OA    Examination-Activity Limitations Locomotion Level;Stairs;Stand;Transfers    Examination-Participation Restrictions Community Activity;Other   Showing dogs   Stability/Clinical Decision Making Unstable/Unpredictable    Rehab Potential Good    PT Frequency 2x / week    PT Duration 8 weeks    PT Treatment/Interventions ADLs/Self Care Home Management;Aquatic Therapy;Biofeedback;Canalith Repostioning;Cryotherapy;Electrical Stimulation;Moist Heat;Iontophoresis 15m/ml Dexamethasone;Traction;Ultrasound;Contrast Bath;DME Instruction;Gait training;Stair training;Functional mobility training;Therapeutic activities;Therapeutic exercise;Balance training;Neuromuscular re-education;Patient/family education;Manual techniques;Passive range of motion;Dry needling;Vestibular;Spinal Manipulations;Joint Manipulations    PT Next Visit Plan Progress note, Progress balance and strength    PT Home Exercise Plan Access Code: [email or web address]   Consulted and Agree with Plan of Care Patient             Patient will benefit from skilled therapeutic intervention in order to improve the following deficits and impairments:  Decreased balance, Difficulty walking  Visit Diagnosis: Unsteadiness on feet     Problem List Patient Active Problem List   Diagnosis Date Noted   CAP (community acquired pneumonia) 02/23/2021   Chronic diastolic CH[place] (congestive heart [place]) [place] 02/23/2021   Hypokalemia 02/23/2021   Obesity, Class III, BMI 40-49.9 (morbid obesity) ([place] 002/28/4069  Acute diastolic heart failure ([place] 02/10/2020   Acute respiratory failure with hypoxia ([place] 02/09/2020   Hydronephrosis with renal and ureteral calculus obstruction 02/09/2020   Acute pyelonephritis 086/14/8307  Acute diastolic CHF  (congestive heart failure) ([facility] 02/09/2020   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/09/2020   AF (paroxysmal atrial fibrillation) ([place] s/p ablation 02/09/2020   Post-op pain 04/08/2018   Post-operative pain 04/06/2018   Atrial fibrillation with RVR ([place] 12/14/2017   Hypothyroidism 12/14/2017   Valvular heart disease 12/14/2017   Muscle cramps 12/14/2017   S/P complete thyroidectomy 08/03/2017   JLyndel SafeHuprich PT, DPT, GCS  Athziry Millican 03/03/2021, 1:05 PM  [facility] [place] [address]Phone: [phone]  Fax:  [phone] Name: DChalyn AmescuaMAvicenna Asc IncMRN: [id number] of Birth: [birth year]

## 2021-03-02 NOTE — Progress Notes (Signed)
Patient ID: Mandy Jones, female    DOB: 02-25-1951, 70 y.o.   MRN: [id number]  HPI  Mandy Jones is a 70 y/o female with a history of atrial fibrillation, HTN, thyroid disease, GERD, sleep apnea, depression, fibromyalgia, hyperlipidemia, previous tobacco use and chronic heart failure.   Echo report from 10/15/20 reviewed and showed an EF of 40%. Echo report from 02/10/20 reviewed and showed an EF of 55-60% along with mild LAE.   Admitted 02/23/21 due to fever, cough & shortness of breath due to pneumonia. IV antibiotics initially given with transition to oral medication. Discharged after 2 days.   She presents today for a follow-up visit with a chief complaint of moderate shortness of breath upon minimal exertion. She describes this as chronic in nature having been present for several years although does feel worse since her recent admission with pneumonia. She has associated fatigue, cough, pedal edema, abdominal distention, dizziness, difficulty sleeping and fluctuating weight along with this.   Takes her furosemide once a day because if she takes it twice a day, it keeps her up too much at night. Had been on beta blocker in the past but was stopped due to bradycardia.   Going to PT twice a week. Using spirometer at home.   Past Medical History:  Diagnosis Date   Arrhythmia    atrial fibrillation   Arthritis    CHF (congestive heart failure) (HCC)    Depression    Dyspnea    Dysrhythmia    Fibromyalgia 1992   GERD (gastroesophageal reflux disease)    Hepatitis    Hepatitis B 1985   Hyperlipidemia    Hypertension    Mitral valve prolapse    Pacemaker    Sleep apnea    CPAP   Tendon tear, ankle    rt foot   Thyroid dysfunction    Past Surgical History:  Procedure Laterality Date   ACHILLES TENDON SURGERY Right 04/06/2018   Procedure: ACHILLES TENDON REPAIR;  Surgeon: [name], DPM;  Location: [facility];  Service: Podiatry;  Laterality: Right;   CARDIOVERSION N/A  02/06/2018   Procedure: CARDIOVERSION;  Surgeon: Corey Skains, MD;  Location: [facility];  Service: Cardiovascular;  Laterality: N/A;   CARDIOVERSION N/A 03/28/2018   Procedure: CARDIOVERSION;  Surgeon: Corey Skains, MD;  Location: [facility];  Service: Cardiovascular;  Laterality: N/A;   CARDIOVERSION N/A 05/09/2018   Procedure: CARDIOVERSION;  Surgeon: Corey Skains, MD;  Location: [facility];  Service: Cardiovascular;  Laterality: N/A;   CARDIOVERSION N/A 10/23/2019   Procedure: CARDIOVERSION;  Surgeon: Corey Skains, MD;  Location: [facility];  Service: Cardiovascular;  Laterality: N/A;   CARDIOVERSION N/A 09/30/2020   Procedure: CARDIOVERSION;  Surgeon: Corey Skains, MD;  Location: [facility];  Service: Cardiovascular;  Laterality: N/A;   CARDIOVERSION N/A 11/19/2020   Procedure: CARDIOVERSION;  Surgeon: Corey Skains, MD;  Location: [facility];  Service: Cardiovascular;  Laterality: N/A;   CHOLECYSTECTOMY     CONTINUOUS NERVE MONITORING N/A 08/03/2017   Procedure: LARYNGEAL NERVE MONITORING;  Surgeon: Carloyn Manner, MD;  Location: [facility];  Service: ENT;  Laterality: N/A;   EXTRACORPOREAL SHOCK WAVE LITHOTRIPSY Right 02/13/2020   Procedure: EXTRACORPOREAL SHOCK WAVE LITHOTRIPSY (ESWL);  Surgeon: Billey Co, MD;  Location: [facility];  Service: Urology;  Laterality: Right;   EYE SURGERY Bilateral    FOOT SURGERY Left 2000   JOINT REPLACEMENT     left knee   OSTECTOMY Right 04/06/2018  Procedure: OSTECTOMY-HAGLUNDS/RECTROCALCANEAL;  Surgeon: [name], DPM;  Location: [facility];  Service: Podiatry;  Laterality: Right;   SHOULDER ARTHROSCOPY Bilateral    SHOULDER ARTHROSCOPY Left    THYROIDECTOMY N/A 08/03/2017   Procedure: THYROIDECTOMY;  Surgeon: Carloyn Manner, MD;  Location: [facility];  Service: ENT;  Laterality: N/A;   TONSILLECTOMY     TUBAL LIGATION     Family History  Problem Relation Age of Onset   Hypertension Mother    Gastric cancer Father     Social History   Tobacco Use   Smoking status: Former    Pack years: 0.00   Smokeless tobacco: Never   Tobacco comments:    quit 30 years ago  Substance Use Topics   Alcohol use: Yes    Comment: occassional   Allergies  Allergen Reactions   Tape Rash    Pads used for ablation left a rash for a couple of days.   Prior to Admission medications   Medication Sig Start Date End Date Taking? Authorizing Provider  amiodarone (PACERONE) 200 MG tablet Take 1 tablet (200 mg total) by mouth daily. 11/19/20  Yes Corey Skains, MD  apixaban (ELIQUIS) 5 MG TABS tablet Take 1 tablet (5 mg total) by mouth 2 (two) times daily. 02/14/20  Yes Val Riles, MD  Ascorbic Acid (VITAMIN C) 1000 MG tablet Take 1,000 mg by mouth every evening.   Yes [provider]  B Complex-C (B-COMPLEX WITH VITAMIN C) tablet Take 1 tablet by mouth daily.   Yes [provider]  Boswellia-Glucosamine-Vit D (OSTEO BI-FLEX ONE PER DAY) TABS Take 1 tablet by mouth daily.   Yes [provider]  Calcium-Magnesium-Vitamin D (CALCIUM MAGNESIUM PO) Take 1 tablet by mouth daily.   Yes [provider]  cefdinir (OMNICEF) 300 MG capsule Take 1 capsule (300 mg total) by mouth 2 (two) times daily for 7 days. 02/25/21 03/04/21 Yes Dahal, Marlowe Aschoff, MD  Cholecalciferol (VITAMIN D3) 50 MCG (2000 UT) TABS Take 2,000 Units by mouth at bedtime.   Yes [provider]  cycloSPORINE (RESTASIS) 0.05 % ophthalmic emulsion Place 1 drop into both eyes 2 (two) times daily.   Yes [provider]  famotidine (PEPCID) 20 MG tablet Take 40 mg by mouth at bedtime. 10/09/19  Yes [provider]  furosemide (LASIX) 40 MG tablet Take 1 tablet (40 mg total) by mouth 2 (two) times daily. 02/11/20  Yes Val Riles, MD  guaiFENesin-dextromethorphan (ROBITUSSIN DM) 100-10 MG/5ML syrup Take 5 mLs by mouth every 4 (four) hours as needed for cough. 02/25/21  Yes Dahal, Marlowe Aschoff, MD  levothyroxine (SYNTHROID)  150 MCG tablet Take 150 mcg by mouth daily before breakfast. 10/15/19  Yes [provider]  melatonin 5 MG TABS Take 5 mg by mouth at bedtime as needed.   Yes [provider]  Multiple Vitamins-Minerals (LUTEIN-ZEAXANTHIN) TABS Take 1 tablet by mouth in the morning and at bedtime.   Yes [provider]  potassium chloride (KLOR-CON) 10 MEQ tablet Take 10 mEq by mouth 2 (two) times daily. 09/12/20  Yes [provider]  Red Yeast Rice Extract 600 MG CAPS Take 600 mg by mouth every evening.   Yes [provider]  saccharomyces boulardii (FLORASTOR) 250 MG capsule Take 1 capsule (250 mg total) by mouth 2 (two) times daily for 7 days. 02/25/21 03/04/21 Yes Dahal, Marlowe Aschoff, MD  sacubitril-valsartan (ENTRESTO) 24-26 MG Take 1 tablet by mouth 2 (two) times daily.  11/14/20  Yes Alisa Graff,  FNP  traZODone (DESYREL) 50 MG tablet Take 50 mg by mouth at bedtime as needed for sleep. 12/04/17  Yes [provider]  valACYclovir (VALTREX) 1000 MG tablet Take 2,000 mg by mouth as directed. For breakout 11/23/20  Yes [provider]    Review of Systems  Constitutional:  Positive for fatigue. Negative for appetite change.  HENT:  Negative for congestion, postnasal drip and sore throat.   Eyes: Negative.   Respiratory:  Positive for cough ("little bit") and shortness of breath. Negative for chest tightness.   Cardiovascular:  Positive for leg swelling (minimal). Negative for chest pain and palpitations.  Gastrointestinal:  Positive for abdominal distention. Negative for abdominal pain.  Endocrine: Negative.   Musculoskeletal:  Positive for arthralgias (left knee). Negative for back pain.  Skin: Negative.   Allergic/Immunologic: Negative.   Neurological:  Positive for dizziness. Negative for light-headedness.  Hematological:  Negative for adenopathy. Does not bruise/bleed easily.  Psychiatric/Behavioral:  Positive for sleep disturbance (interrupted sleep;  sleeping on 1-2 pillows; wearing CPAP most nights). Negative for dysphoric mood. The patient is not nervous/anxious.    Vitals:   03/03/21 0946  BP: (!) 124/58  Pulse: 70  Resp: 16  SpO2: 98%  Weight: 229 lb (103.9 kg)  Height: 5\' 2"  (1.575 m)   Wt Readings from Last 3 Encounters:  03/03/21 229 lb (103.9 kg)  02/22/21 225 lb (102.1 kg)  01/12/21 220 lb (99.8 kg)   Lab Results  Component Value Date   CREATININE 0.62 02/25/2021   CREATININE 0.54 02/24/2021   CREATININE 0.54 02/22/2021   Physical Exam Vitals and nursing note reviewed.  Constitutional:      Appearance: Normal appearance.  HENT:     Head: Normocephalic and atraumatic.  Cardiovascular:     Rate and Rhythm: Normal rate and regular rhythm.  Pulmonary:     Effort: Pulmonary effort is normal. No respiratory distress.     Breath sounds: No wheezing or rales.  Abdominal:     General: There is distension.     Palpations: Abdomen is soft.  Musculoskeletal:        General: No tenderness.     Cervical back: Normal range of motion and neck supple.     Right lower leg: Edema (trace pitting) present.     Left lower leg: Edema (trace pitting) present.  Skin:    General: Skin is warm and dry.  Neurological:     General: No focal deficit present.     Mental Status: She is alert and oriented to person, place, and time.  Psychiatric:        Mood and Affect: Mood normal.        Behavior: Behavior normal.        Thought Content: Thought content normal.   Assessment & Plan:  1: Chronic heart failure with reduced ejection fraction- - NYHA class III - euvolemic today - weighing daily; reminded to call for an overnight weight gain of >2 pounds or a weekly weight gain of >5 pounds - weight up 9 pounds from last visit here 7 months ago - not adding salt except to pasta water - saw cardiology Nehemiah Massed) 02/09/21; returns 04/15/21 - on GDMT of entresto - previous history of bradycardia on beta-blocker but she now has a  pacemaker present - discussed adding metoprolol, spironolactone or SGLT2 today; uses of medications were discussed and we will start spironolactone 25mg  daily - when she starts the medication, she is to reduce her potassium to  once a day; (was taking it BID); may be able to stop potassium in the future depending on lab results - order written out for her to get BMP drawn at [facility] in 1 week - will also recheck it at her next visit with me  - saw pulmonology Raul Del) 12/24/20; returns 06/24/21 - going to physical therapy twice a week - encouraged her to elevate her legs when sitting for long periods of time; does wear compression socks but doesn't have them on today - discussed pulmonary rehab referral and she is interested but will discuss further with Dr. Marcello Moores later this week; advised her to let me know if she needs me to place the referra.  - BNP 02/22/21 was 108.8  2: HTN- - BP looks good today - saw PCP Quentin Cornwall) 11/23/20 - BMP 02/25/21 reviewed and showed sodium 142, potassium 4.1, creatinine 0.62 and GFR >60  3: Atrial fibrillation- - previous ablation done - on amiodarone and apixaban - sees EP Marcello Moores) 03/05/21; pacemaker placed May 2022   Patient did not bring her medications nor a list. Each medication was verbally reviewed with the patient and she was encouraged to bring the bottles to every visit to confirm accuracy of list.  Return in 1 month or sooner for any questions/problems before then.

## 2021-03-03 ENCOUNTER — Ambulatory Visit: Payer: Medicare HMO | Attending: Family | Admitting: Family

## 2021-03-03 ENCOUNTER — Encounter: Payer: Self-pay | Admitting: Family

## 2021-03-03 ENCOUNTER — Other Ambulatory Visit: Payer: Self-pay | Admitting: Family

## 2021-03-03 VITALS — BP 124/58 | HR 70 | Resp 16 | Ht 62.0 in | Wt 229.0 lb

## 2021-03-03 DIAGNOSIS — I110 Hypertensive heart disease with heart failure: Secondary | ICD-10-CM | POA: Diagnosis not present

## 2021-03-03 DIAGNOSIS — I4891 Unspecified atrial fibrillation: Secondary | ICD-10-CM | POA: Diagnosis not present

## 2021-03-03 DIAGNOSIS — Z950 Presence of cardiac pacemaker: Secondary | ICD-10-CM | POA: Insufficient documentation

## 2021-03-03 DIAGNOSIS — Z79899 Other long term (current) drug therapy: Secondary | ICD-10-CM | POA: Diagnosis not present

## 2021-03-03 DIAGNOSIS — E785 Hyperlipidemia, unspecified: Secondary | ICD-10-CM | POA: Diagnosis not present

## 2021-03-03 DIAGNOSIS — Z87891 Personal history of nicotine dependence: Secondary | ICD-10-CM | POA: Diagnosis not present

## 2021-03-03 DIAGNOSIS — I480 Paroxysmal atrial fibrillation: Secondary | ICD-10-CM

## 2021-03-03 DIAGNOSIS — I341 Nonrheumatic mitral (valve) prolapse: Secondary | ICD-10-CM | POA: Insufficient documentation

## 2021-03-03 DIAGNOSIS — Z8249 Family history of ischemic heart disease and other diseases of the circulatory system: Secondary | ICD-10-CM | POA: Insufficient documentation

## 2021-03-03 DIAGNOSIS — I509 Heart failure, unspecified: Secondary | ICD-10-CM | POA: Diagnosis present

## 2021-03-03 DIAGNOSIS — Z7901 Long term (current) use of anticoagulants: Secondary | ICD-10-CM | POA: Insufficient documentation

## 2021-03-03 DIAGNOSIS — I10 Essential (primary) hypertension: Secondary | ICD-10-CM

## 2021-03-03 DIAGNOSIS — I5022 Chronic systolic (congestive) heart failure: Secondary | ICD-10-CM

## 2021-03-03 MED ORDER — SPIRONOLACTONE 25 MG PO TABS: 25.0000 mg | ORAL_TABLET | Freq: Every day | ORAL | 5 refills | Status: DC

## 2021-03-03 NOTE — Patient Instructions (Addendum)
Continue weighing daily and call for an overnight weight gain of > 2 pounds or a weekly weight gain of >5 pounds.    Ask Dr. Marcello Moores about pulmonary rehab referral.    Adding spironolactone 25mg  once a day in the morning. When you start this medication, decrease your potassium to 1 tablet daily.

## 2021-03-04 ENCOUNTER — Ambulatory Visit: Payer: Medicare HMO

## 2021-03-04 ENCOUNTER — Other Ambulatory Visit: Payer: Self-pay

## 2021-03-04 VITALS — HR 86

## 2021-03-04 DIAGNOSIS — R2681 Unsteadiness on feet: Secondary | ICD-10-CM | POA: Diagnosis not present

## 2021-03-04 DIAGNOSIS — R911 Solitary pulmonary nodule: Secondary | ICD-10-CM | POA: Insufficient documentation

## 2021-03-04 NOTE — Therapy (Signed)
[facility] [address] [place], [place], [address] Phone: [phone]   Fax:  [phone]  Physical Therapy Treatment  Patient Details  Name: Mandy Jones [facility] MRN: [id number] Date of Birth: [birth year] Referring Provider (PT): Carloyn Manner   Encounter Date: 03/04/2021   PT End of Session - 03/04/21 1328     Visit Number 12    Number of Visits 33    Date for PT Re-Evaluation 04/01/21    Authorization Type eval: 10/07/90, recert: 11/29/66    PT Start Time 1322    PT Stop Time 1400    PT Time Calculation (min) 38 min    Activity Tolerance Patient tolerated treatment well    Behavior During Therapy [facility] for tasks assessed/performed             Past Medical History:  Diagnosis Date   Arrhythmia    atrial fibrillation   Arthritis    CHF (congestive heart failure) ([place])    Depression    Dyspnea    Dysrhythmia    Fibromyalgia 1992   GERD (gastroesophageal reflux disease)    Hepatitis    Hepatitis B 1985   Hyperlipidemia    Hypertension    Mitral valve prolapse    Pacemaker    Pacemaker 01/13/2021   Sleep apnea    CPAP   Tendon tear, ankle    rt foot   Thyroid dysfunction     Past Surgical History:  Procedure Laterality Date   ACHILLES TENDON SURGERY Right 04/06/2018   Procedure: ACHILLES TENDON REPAIR;  Surgeon: [name], DPM;  Location: [facility];  Service: Podiatry;  Laterality: Right;   CARDIOVERSION N/A 02/06/2018   Procedure: CARDIOVERSION;  Surgeon: Corey Skains, MD;  Location: [facility];  Service: Cardiovascular;  Laterality: N/A;   CARDIOVERSION N/A 03/28/2018   Procedure: CARDIOVERSION;  Surgeon: Corey Skains, MD;  Location: [facility];  Service: Cardiovascular;  Laterality: N/A;   CARDIOVERSION N/A 05/09/2018   Procedure: CARDIOVERSION;  Surgeon: Corey Skains, MD;  Location: [facility];  Service: Cardiovascular;  Laterality: N/A;   CARDIOVERSION N/A 10/23/2019   Procedure:  CARDIOVERSION;  Surgeon: Corey Skains, MD;  Location: [facility];  Service: Cardiovascular;  Laterality: N/A;   CARDIOVERSION N/A 09/30/2020   Procedure: CARDIOVERSION;  Surgeon: Corey Skains, MD;  Location: [facility];  Service: Cardiovascular;  Laterality: N/A;   CARDIOVERSION N/A 11/19/2020   Procedure: CARDIOVERSION;  Surgeon: Corey Skains, MD;  Location: [facility];  Service: Cardiovascular;  Laterality: N/A;   CHOLECYSTECTOMY     CONTINUOUS NERVE MONITORING N/A 08/03/2017   Procedure: LARYNGEAL NERVE MONITORING;  Surgeon: Carloyn Manner, MD;  Location: [facility];  Service: ENT;  Laterality: N/A;   EXTRACORPOREAL SHOCK WAVE LITHOTRIPSY Right 02/13/2020   Procedure: EXTRACORPOREAL SHOCK WAVE LITHOTRIPSY (ESWL);  Surgeon: Billey Co, MD;  Location: [facility];  Service: Urology;  Laterality: Right;   EYE SURGERY Bilateral    FOOT SURGERY Left 2000   JOINT REPLACEMENT     left knee   OSTECTOMY Right 04/06/2018   Procedure: OSTECTOMY-HAGLUNDS/RECTROCALCANEAL;  Surgeon: [name], DPM;  Location: [facility];  Service: Podiatry;  Laterality: Right;   SHOULDER ARTHROSCOPY Bilateral    SHOULDER ARTHROSCOPY Left    THYROIDECTOMY N/A 08/03/2017   Procedure: THYROIDECTOMY;  Surgeon: Carloyn Manner, MD;  Location: [facility];  Service: ENT;  Laterality: N/A;   TONSILLECTOMY     TUBAL LIGATION      Vitals:  03/04/21 1324  Pulse: 86  SpO2: 98%     Subjective Assessment - 03/04/21 1325     Subjective Pt reports that she is doing alright. She had follow-ups yesterday with the CHF clinic and her post-admission follow-up with PCP. She states that they added spironolactone to her medication regimine. No specific questions currently.    Pertinent History Pt referred to vestibular therapy by Dr. Pryor Ochoa for dizziness. She reports "my balance is off and feel wobbly." Her dizziness started after a fall in September 2021 where she hit the left side of her head and face. She was not diagnosed  with a concussion at the time of her injury. She denies any excessive fatigue, difficulty concentrating, headaches, nausea, blurred vision, emotional lability, or disrupted sleep after her fall. She does complain of bilateral tinnitus which started a couple months after the fall. She was seen by Dr. Pryor Ochoa on 10/23/20 who referred her for a VNG study. VNG was positive for abnormal smooth pursuits, saccades, and optokinetic testing. She also had 10 degrees of right beating nystagmus in the body left position and six degrees of left beating nystagmus in the body right position. Calorics were symmetrical. 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 History of cataract removal and macular degeneration. Due to central findings on VNG she was referred by [facility]NT to neurology but has not yet received a call to schedule. She had a R knee replacement in May 2021 and reports that she has osteoarthritis in both feet. History of CHF with fluctuating weight. She had a cardioversion two weeks ago. Possible ablation next month. 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 Pt has bilateral carpal tunnel which causes numbness in her hands. No numbness in the feet. Her primary complaint at this time is difficulty with her balance.    Limitations Walking    Diagnostic tests See history    Patient Stated Goals Improve balance so she can train and show her dogs with more confidence                TREATMENT    Ther-ex  NuStep L2-4 for warm-up with therapist monitoring fatigue and breathing throughout and adjusting resistance x 5 minutes (BORG 3-4/10 at end);  Sit to stand from regular height chair with Airex pad on top without UE support 2 x 10; Mini squats with UE support x 10, cues for proper hip hinge;    Neuromuscular Re-education  All balance exercises performed without UE support unless otherwise  noted;   Firm tandem balance alternating forward LE 2 x 30s each; Firm tandem balance alternating forward LE horizontal head turns x 30s each; Tandem gait in // bars x 6 lengths; Airex balance beam tandem balance alternating forward LE 2 x 30s each; Airex balance beam side stepping x 4 lengths; Heel/toe raises with 3s hold x 10 each;     Pt educated throughout session about proper posture and technique with exercises. Improved exercise technique, movement at target joints, use of target muscles after min to mod verbal, visual, tactile cues.      Patient demonstrates excellent motivation during session today. Monitored her SpO2 which remains >95% throughout the entire session. Incorporated additional balance exercises today as she reports frustration regarding her balance. Continued with a few strengthening exercises as well today. Patient encouraged to continue her HEP and follow-up as scheduled.  She would benefit from additional physical  therapy services to address deficits in strength and balance in order to return to full function at home.                             PT Short Term Goals - 02/17/21 1122       PT SHORT TERM GOAL #1   Title Pt will be independent with HEP in order to improve strength and balance in order to decrease fall risk and improve function at home and when training her dogs    Time 4    Period Weeks    Status On-going    Target Date 03/04/21               PT Long Term Goals - 02/17/21 1122       PT LONG TERM GOAL #1   Title Pt will improve BERG by at least 3 points in order to demonstrate clinically significant improvement in balance.    Baseline 11/30/20: To be completed at next appointment; 12/04/20: 49/56; 02/04/21: 53/56;    Time 12    Period Weeks    Status Achieved      PT LONG TERM GOAL #2   Title Pt will improve ABC by at least 13% in order to demonstrate clinically significant improvement in balance confidence.     Baseline 11/30/20: 67.5%; 02/04/21: 65.625%    Time 12    Period Weeks    Status On-going    Target Date 04/01/21      PT LONG TERM GOAL #3   Title Pt will improve FOTO to at least 55 in order to demonstrate clinically significant improvement in function related to balance and decrease her risk for falls.    Baseline 11/30/20: 46; 02/04/21: 49    Time 12    Period Weeks    Status Partially Met    Target Date 04/01/21      PT LONG TERM GOAL #4   Title Pt will demonstrate at least 4 point improvement in the FGA in order to demonstrate significant improvement in balance to decrease her fall risk at home and improve safety at home    Baseline 12/03/20:18/30; 02/04/21: 20/30;    Time 12    Period Weeks    Status Partially Met    Target Date 04/01/21      PT LONG TERM GOAL #5   Title Pt will increase self-selected 10MWT by at least 0.13 m/s in order to demonstrate clinically significant improvement in community ambulation.    Baseline 12/03/20: 11.2s = 0.36ms; 02/04/21: 0.76 m/s    Time 12    Period Weeks    Status On-going    Target Date 04/01/21                   Plan - 03/04/21 1328     Clinical Impression Statement Patient demonstrates excellent motivation during session today. Monitored her SpO2 which remains >95% throughout the entire session. Incorporated additional balance exercises today as she reports frustration regarding her balance. Continued with a few strengthening exercises as well today. Patient encouraged to continue her HEP and follow-up as scheduled.  She would benefit from additional physical therapy services to address deficits in strength and balance in order to return to full function at home.    Personal Factors and Comorbidities Age;Comorbidity 3+    Comorbidities CHF, A-fib, depression, fibromyalgia, OA    Examination-Activity Limitations Locomotion Level;Stairs;Stand;Transfers    Examination-Participation Restrictions  Community Activity;Other   Showing dogs    Stability/Clinical Decision Making Unstable/Unpredictable    Rehab Potential Good    PT Frequency 2x / week    PT Duration 8 weeks    PT Treatment/Interventions ADLs/Self Care Home Management;Aquatic Therapy;Biofeedback;Canalith Repostioning;Cryotherapy;Electrical Stimulation;Moist Heat;Iontophoresis 61m/ml Dexamethasone;Traction;Ultrasound;Contrast Bath;DME Instruction;Gait training;Stair training;Functional mobility training;Therapeutic activities;Therapeutic exercise;Balance training;Neuromuscular re-education;Patient/family education;Manual techniques;Passive range of motion;Dry needling;Vestibular;Spinal Manipulations;Joint Manipulations    PT Next Visit Plan Progress balance and strength    PT Home Exercise Plan Access Code: [email or web address]   Consulted and Agree with Plan of Care Patient             Patient will benefit from skilled therapeutic intervention in order to improve the following deficits and impairments:  Decreased balance, Difficulty walking  Visit Diagnosis: Unsteadiness on feet     Problem List Patient Active Problem List   Diagnosis Date Noted   CAP (community acquired pneumonia) 02/23/2021   Chronic diastolic CHF (congestive heart failure) ([place] 02/23/2021   Hypokalemia 02/23/2021   Obesity, Class III, BMI 40-49.9 (morbid obesity) ([place] 015/72/6203  Acute diastolic heart failure ([place] 02/10/2020   Acute respiratory failure with hypoxia ([place] 02/09/2020   Hydronephrosis with renal and ureteral calculus obstruction 02/09/2020   Acute pyelonephritis 055/97/4163  Acute diastolic CHF (congestive heart failure) ([place] 02/09/2020   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/09/2020   AF (paroxysmal atrial fibrillation) ([place] s/p ablation 02/09/2020   Post-op pain 04/08/2018   Post-operative pain 04/06/2018   Atrial fibrillation with RVR ([place] 12/14/2017   Hypothyroidism 12/14/2017   Valvular heart disease 12/14/2017   Muscle cramps 12/14/2017   S/P complete thyroidectomy  08/03/2017   JLyndel SafeHuprich PT, DPT, GCS  Nadira Single 03/04/2021, 2:05 PM  [place] [facility] [place] [address]Phone: [phone]  Fax:  [phone] Name: Mandy KuhnleMOhio Valley Medical CenterMRN: [id number] of Birth: [birth year]

## 2021-03-09 ENCOUNTER — Other Ambulatory Visit: Payer: Self-pay

## 2021-03-09 ENCOUNTER — Ambulatory Visit: Payer: Medicare HMO

## 2021-03-09 VITALS — BP 118/67 | HR 84

## 2021-03-09 DIAGNOSIS — R2681 Unsteadiness on feet: Secondary | ICD-10-CM

## 2021-03-09 NOTE — Therapy (Signed)
[place] [address] [place], [place], [address] Phone: [phone]   Fax:  [phone]  Physical Therapy Treatment  Patient Details  Name: Mandy Jones [facility] MRN: [id number] Date of Birth: 07-22-1951 Referring Provider (PT): Carloyn Manner   Encounter Date: 03/09/2021   PT End of Session - 03/09/21 1203     Visit Number 13    Number of Visits 33    Date for PT Re-Evaluation 04/01/21    Authorization Type eval: 04/04/85, recert: 03/04/71    PT Start Time 1155    PT Stop Time 1225    PT Time Calculation (min) 30 min    Activity Tolerance Patient tolerated treatment well    Behavior During Therapy [facility] for tasks assessed/performed             Past Medical History:  Diagnosis Date   Arrhythmia    atrial fibrillation   Arthritis    CHF (congestive heart failure) ([place])    Depression    Dyspnea    Dysrhythmia    Fibromyalgia 1992   GERD (gastroesophageal reflux disease)    Hepatitis    Hepatitis B 1985   Hyperlipidemia    Hypertension    Mitral valve prolapse    Pacemaker    Pacemaker 01/13/2021   Sleep apnea    CPAP   Tendon tear, ankle    rt foot   Thyroid dysfunction     Past Surgical History:  Procedure Laterality Date   ACHILLES TENDON SURGERY Right 04/06/2018   Procedure: ACHILLES TENDON REPAIR;  Surgeon: [name], DPM;  Location: [facility];  Service: Podiatry;  Laterality: Right;   CARDIOVERSION N/A 02/06/2018   Procedure: CARDIOVERSION;  Surgeon: Corey Skains, MD;  Location: [facility];  Service: Cardiovascular;  Laterality: N/A;   CARDIOVERSION N/A 03/28/2018   Procedure: CARDIOVERSION;  Surgeon: Corey Skains, MD;  Location: [facility];  Service: Cardiovascular;  Laterality: N/A;   CARDIOVERSION N/A 05/09/2018   Procedure: CARDIOVERSION;  Surgeon: Corey Skains, MD;  Location: [facility];  Service: Cardiovascular;  Laterality: N/A;   CARDIOVERSION N/A 10/23/2019   Procedure:  CARDIOVERSION;  Surgeon: Corey Skains, MD;  Location: [facility];  Service: Cardiovascular;  Laterality: N/A;   CARDIOVERSION N/A 09/30/2020   Procedure: CARDIOVERSION;  Surgeon: Corey Skains, MD;  Location: [facility];  Service: Cardiovascular;  Laterality: N/A;   CARDIOVERSION N/A 11/19/2020   Procedure: CARDIOVERSION;  Surgeon: Corey Skains, MD;  Location: [facility];  Service: Cardiovascular;  Laterality: N/A;   CHOLECYSTECTOMY     CONTINUOUS NERVE MONITORING N/A 08/03/2017   Procedure: LARYNGEAL NERVE MONITORING;  Surgeon: Carloyn Manner, MD;  Location: [facility];  Service: ENT;  Laterality: N/A;   EXTRACORPOREAL SHOCK WAVE LITHOTRIPSY Right 02/13/2020   Procedure: EXTRACORPOREAL SHOCK WAVE LITHOTRIPSY (ESWL);  Surgeon: Billey Co, MD;  Location: [facility];  Service: Urology;  Laterality: Right;   EYE SURGERY Bilateral    FOOT SURGERY Left 2000   JOINT REPLACEMENT     left knee   OSTECTOMY Right 04/06/2018   Procedure: OSTECTOMY-HAGLUNDS/RECTROCALCANEAL;  Surgeon: [name], DPM;  Location: [facility];  Service: Podiatry;  Laterality: Right;   SHOULDER ARTHROSCOPY Bilateral    SHOULDER ARTHROSCOPY Left    THYROIDECTOMY N/A 08/03/2017   Procedure: THYROIDECTOMY;  Surgeon: Carloyn Manner, MD;  Location: [facility];  Service: ENT;  Laterality: N/A;   TONSILLECTOMY     TUBAL LIGATION      Vitals:  03/09/21 1159  BP: 118/67  Pulse: 84     Subjective Assessment - 03/09/21 1201     Subjective Pt reports that she is doing alright. She saw [facility] for a pacemaker follow-up and the physician said she is still in a-fib and he wants to do a cardioversion. She has a follow-up appointment with cardiology on Thursday. Denies pain currently. No specific questions currently.    Pertinent History Pt referred to vestibular therapy by Dr. Pryor Ochoa for dizziness. She reports "my balance is off and feel wobbly." Her dizziness started after a fall in September 2021 where she hit the left  side of her head and face. She was not diagnosed with a concussion at the time of her injury. She denies any excessive fatigue, difficulty concentrating, headaches, nausea, blurred vision, emotional lability, or disrupted sleep after her fall. She does complain of bilateral tinnitus which started a couple months after the fall. She was seen by Dr. Pryor Ochoa on 10/23/20 who referred her for a VNG study. VNG was positive for abnormal smooth pursuits, saccades, and optokinetic testing. She also had 10 degrees of right beating nystagmus in the body left position and six degrees of left beating nystagmus in the body right position. Calorics were symmetrical. 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 History of cataract removal and macular degeneration. Due to central findings on VNG she was referred by [facility] to neurology but has not yet received a call to schedule. She had a R knee replacement in May 2021 and reports that she has osteoarthritis in both feet. History of CHF with fluctuating weight. She had a cardioversion two weeks ago. Possible ablation next month. 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 Pt has bilateral carpal tunnel which causes numbness in her hands. No numbness in the feet. Her primary complaint at this time is difficulty with her balance.    Limitations Walking    Diagnostic tests See history    Patient Stated Goals Improve balance so she can train and show her dogs with more confidence                TREATMENT    Ther-ex  NuStep L2-4 for warm-up with therapist monitoring fatigue and breathing throughout and adjusting resistance x 5 minutes;  Sit to stand from regular height chair with Airex pad on top without UE support x 10, Airex pad removed x 10; Mini squats with UE support x 10, cues for proper hip hinge;    Neuromuscular Re-education  All balance exercises  performed without UE support unless otherwise noted;   Firm tandem balance alternating forward LE 2 x 30s each; Firm tandem balance alternating forward LE horizontal head turns x 30s each; Tandem gait in // bars x 6 lengths; Heel/toe raises with 3s hold x 10 each; 6" forward hurdle steps x 10 leading with each LE; 6" lateral hurdle steps x 5 each direction;   Pt educated throughout session about proper posture and technique with exercises. Improved exercise technique, movement at target joints, use of target muscles after min to mod verbal, visual, tactile cues.      Patient demonstrates excellent motivation during session today. Continued with balance exercises and strengthening today. She arrived late so her session was abbreviated accordingly. She sees Dr. Nehemiah Massed her primary cardiologist on Thursday to talk about the possibility of a cardioversion. Continued with a few strengthening exercises as well  today. Patient encouraged to continue her HEP and follow-up as scheduled.  She would benefit from additional physical therapy services to address deficits in strength and balance in order to return to full function at home.                             PT Short Term Goals - 02/17/21 1122       PT SHORT TERM GOAL #1   Title Pt will be independent with HEP in order to improve strength and balance in order to decrease fall risk and improve function at home and when training her dogs    Time 4    Period Weeks    Status On-going    Target Date 03/04/21               PT Long Term Goals - 02/17/21 1122       PT LONG TERM GOAL #1   Title Pt will improve BERG by at least 3 points in order to demonstrate clinically significant improvement in balance.    Baseline 11/30/20: To be completed at next appointment; 12/04/20: 49/56; 02/04/21: 53/56;    Time 12    Period Weeks    Status Achieved      PT LONG TERM GOAL #2   Title Pt will improve ABC by at least 13% in order  to demonstrate clinically significant improvement in balance confidence.    Baseline 11/30/20: 67.5%; 02/04/21: 65.625%    Time 12    Period Weeks    Status On-going    Target Date 04/01/21      PT LONG TERM GOAL #3   Title Pt will improve FOTO to at least 55 in order to demonstrate clinically significant improvement in function related to balance and decrease her risk for falls.    Baseline 11/30/20: 46; 02/04/21: 49    Time 12    Period Weeks    Status Partially Met    Target Date 04/01/21      PT LONG TERM GOAL #4   Title Pt will demonstrate at least 4 point improvement in the FGA in order to demonstrate significant improvement in balance to decrease her fall risk at home and improve safety at home    Baseline 12/03/20:18/30; 02/04/21: 20/30;    Time 12    Period Weeks    Status Partially Met    Target Date 04/01/21      PT LONG TERM GOAL #5   Title Pt will increase self-selected 10MWT by at least 0.13 m/s in order to demonstrate clinically significant improvement in community ambulation.    Baseline 12/03/20: 11.2s = 0.15ms; 02/04/21: 0.76 m/s    Time 12    Period Weeks    Status On-going    Target Date 04/01/21                   Plan - 03/09/21 1204     Clinical Impression Statement Patient demonstrates excellent motivation during session today. Continued with balance exercises and strengthening today. She arrived late so her session was abbreviated accordingly. She sees Dr. KNehemiah Massedher primary cardiologist on Thursday to talk about the possibility of a cardioversion. Continued with a few strengthening exercises as well today. Patient encouraged to continue her HEP and follow-up as scheduled.  She would benefit from additional physical therapy services to address deficits in strength and balance in order to return to full function at home.  Personal Factors and Comorbidities Age;Comorbidity 3+    Comorbidities CHF, A-fib, depression, fibromyalgia, OA    Examination-Activity  Limitations Locomotion Level;Stairs;Stand;Transfers    Examination-Participation Restrictions Community Activity;Other   Showing dogs   Stability/Clinical Decision Making Unstable/Unpredictable    Rehab Potential Good    PT Frequency 2x / week    PT Duration 8 weeks    PT Treatment/Interventions ADLs/Self Care Home Management;Aquatic Therapy;Biofeedback;Canalith Repostioning;Cryotherapy;Electrical Stimulation;Moist Heat;Iontophoresis 79m/ml Dexamethasone;Traction;Ultrasound;Contrast Bath;DME Instruction;Gait training;Stair training;Functional mobility training;Therapeutic activities;Therapeutic exercise;Balance training;Neuromuscular re-education;Patient/family education;Manual techniques;Passive range of motion;Dry needling;Vestibular;Spinal Manipulations;Joint Manipulations    PT Next Visit Plan Progress balance and strength    PT Home Exercise Plan Access Code: [email or web address]   Consulted and Agree with Plan of Care Patient             Patient will benefit from skilled therapeutic intervention in order to improve the following deficits and impairments:  Decreased balance, Difficulty walking  Visit Diagnosis: Unsteadiness on feet     Problem List Patient Active Problem List   Diagnosis Date Noted   CAP (community acquired pneumonia) 02/23/2021   Chronic diastolic CHF (congestive heart failure) ([place] 02/23/2021   Hypokalemia 02/23/2021   Obesity, Class III, BMI 40-49.9 (morbid obesity) ([place] 035/32/9924  Acute diastolic heart failure ([place] 02/10/2020   Acute respiratory failure with hypoxia ([place] 02/09/2020   Hydronephrosis with renal and ureteral calculus obstruction 02/09/2020   Acute pyelonephritis 026/83/4196  Acute diastolic CHF (congestive heart failure) ([place] 02/09/2020   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/09/2020   AF (paroxysmal atrial fibrillation) ([place] s/p ablation 02/09/2020   Post-op pain 04/08/2018   Post-operative pain 04/06/2018   Atrial fibrillation with RVR ([place]  12/14/2017   Hypothyroidism 12/14/2017   Valvular heart disease 12/14/2017   Muscle cramps 12/14/2017   S/P complete thyroidectomy 08/03/2017    JLyndel SafeHuprich PT, DPT, GCS  Mandy Jones 03/09/2021, 1:03 PM  [place] [facility] [place] [address]Phone: [phone]  Fax:  [phone] Name: DKynzli ReaseMSolara Hospital McallenMRN: [id number] of Birth: 1November 09, 1952

## 2021-03-12 ENCOUNTER — Other Ambulatory Visit: Payer: Self-pay

## 2021-03-12 ENCOUNTER — Ambulatory Visit: Payer: Medicare HMO

## 2021-03-12 VITALS — BP 99/58 | HR 85

## 2021-03-12 DIAGNOSIS — R2681 Unsteadiness on feet: Secondary | ICD-10-CM | POA: Diagnosis not present

## 2021-03-12 NOTE — Therapy (Signed)
[facility] [address] [place], [place], [address] Phone: [phone]   Fax:  [phone]  Physical Therapy Treatment  Patient Details  Name: Mandy Jones [facility] MRN: [id number] Date of Birth: 1951/01/25 Referring Provider (PT): Carloyn Manner   Encounter Date: 03/12/2021   PT End of Session - 03/12/21 1207     Visit Number 14    Number of Visits 33    Date for PT Re-Evaluation 04/01/21    Authorization Type eval: 0/6/23, recert: 03/04/27    PT Start Time 1158    PT Stop Time 3151    PT Time Calculation (min) 32 min    Activity Tolerance Patient tolerated treatment well    Behavior During Therapy [facility] for tasks assessed/performed             Past Medical History:  Diagnosis Date   Arrhythmia    atrial fibrillation   Arthritis    CHF (congestive heart failure) ([place])    Depression    Dyspnea    Dysrhythmia    Fibromyalgia 1992   GERD (gastroesophageal reflux disease)    Hepatitis    Hepatitis B 1985   Hyperlipidemia    Hypertension    Mitral valve prolapse    Pacemaker    Pacemaker 01/13/2021   Sleep apnea    CPAP   Tendon tear, ankle    rt foot   Thyroid dysfunction     Past Surgical History:  Procedure Laterality Date   ACHILLES TENDON SURGERY Right 04/06/2018   Procedure: ACHILLES TENDON REPAIR;  Surgeon: [name], DPM;  Location: [facility];  Service: Podiatry;  Laterality: Right;   CARDIOVERSION N/A 02/06/2018   Procedure: CARDIOVERSION;  Surgeon: Corey Skains, MD;  Location: [facility];  Service: Cardiovascular;  Laterality: N/A;   CARDIOVERSION N/A 03/28/2018   Procedure: CARDIOVERSION;  Surgeon: Corey Skains, MD;  Location: [facility];  Service: Cardiovascular;  Laterality: N/A;   CARDIOVERSION N/A 05/09/2018   Procedure: CARDIOVERSION;  Surgeon: Corey Skains, MD;  Location: [facility];  Service: Cardiovascular;  Laterality: N/A;   CARDIOVERSION N/A 10/23/2019   Procedure:  CARDIOVERSION;  Surgeon: Corey Skains, MD;  Location: [facility];  Service: Cardiovascular;  Laterality: N/A;   CARDIOVERSION N/A 09/30/2020   Procedure: CARDIOVERSION;  Surgeon: Corey Skains, MD;  Location: [facility];  Service: Cardiovascular;  Laterality: N/A;   CARDIOVERSION N/A 11/19/2020   Procedure: CARDIOVERSION;  Surgeon: Corey Skains, MD;  Location: [facility];  Service: Cardiovascular;  Laterality: N/A;   CHOLECYSTECTOMY     CONTINUOUS NERVE MONITORING N/A 08/03/2017   Procedure: LARYNGEAL NERVE MONITORING;  Surgeon: Carloyn Manner, MD;  Location: [facility];  Service: ENT;  Laterality: N/A;   EXTRACORPOREAL SHOCK WAVE LITHOTRIPSY Right 02/13/2020   Procedure: EXTRACORPOREAL SHOCK WAVE LITHOTRIPSY (ESWL);  Surgeon: Billey Co, MD;  Location: [facility];  Service: Urology;  Laterality: Right;   EYE SURGERY Bilateral    FOOT SURGERY Left 2000   JOINT REPLACEMENT     left knee   OSTECTOMY Right 04/06/2018   Procedure: OSTECTOMY-HAGLUNDS/RECTROCALCANEAL;  Surgeon: [name], DPM;  Location: [facility];  Service: Podiatry;  Laterality: Right;   SHOULDER ARTHROSCOPY Bilateral    SHOULDER ARTHROSCOPY Left    THYROIDECTOMY N/A 08/03/2017   Procedure: THYROIDECTOMY;  Surgeon: Carloyn Manner, MD;  Location: [facility];  Service: ENT;  Laterality: N/A;   TONSILLECTOMY     TUBAL LIGATION      Vitals:  03/12/21 1203  BP: (!) 99/58  Pulse: 85  SpO2: 97%     Subjective Assessment - 03/12/21 1206     Subjective Pt reports that she is doing alright. She saw cardiology yesterday and they scheduled a cardioversion for next week. Denies pain currently. No specific questions currently.    Pertinent History Pt referred to vestibular therapy by Dr. Pryor Ochoa for dizziness. She reports "my balance is off and feel wobbly." Her dizziness started after a fall in September 2021 where she hit the left side of her head and face. She was not diagnosed with a concussion at the time of her  injury. She denies any excessive fatigue, difficulty concentrating, headaches, nausea, blurred vision, emotional lability, or disrupted sleep after her fall. She does complain of bilateral tinnitus which started a couple months after the fall. She was seen by Dr. Pryor Ochoa on 10/23/20 who referred her for a VNG study. VNG was positive for abnormal smooth pursuits, saccades, and optokinetic testing. She also had 10 degrees of right beating nystagmus in the body left position and six degrees of left beating nystagmus in the body right position. Calorics were symmetrical. 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 History of cataract removal and macular degeneration. Due to central findings on VNG she was referred by [facility] to neurology but has not yet received a call to schedule. She had a R knee replacement in May 2021 and reports that she has osteoarthritis in both feet. History of CHF with fluctuating weight. She had a cardioversion two weeks ago. Possible ablation next month. 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 Pt has bilateral carpal tunnel which causes numbness in her hands. No numbness in the feet. Her primary complaint at this time is difficulty with her balance.    Limitations Walking    Diagnostic tests See history    Patient Stated Goals Improve balance so she can train and show her dogs with more confidence                TREATMENT    Ther-ex  NuStep L2-3 for warm-up with therapist monitoring fatigue and breathing throughout and adjusting resistance x 5 minutes;  Sit to stand from regular height chair without UE support x 5, discontinued due to L knee pain; Mini squats with UE support 2 x 10, cues for proper hip hinge;    Neuromuscular Re-education  All balance exercises performed without UE support unless otherwise noted;   Firm tandem balance alternating forward LE  2 x 30s each; Airex balance beam side stepping x 4 lengths; Airex balance beam tandem gait x 2 lengths; Airex alternating 6" step taps x 10 each;   Pt educated throughout session about proper posture and technique with exercises. Improved exercise technique, movement at target joints, use of target muscles after min to mod verbal, visual, tactile cues.      Patient demonstrates excellent motivation during session today. She arrived late so session was abbreviated accordingly. Continued with balance exercises and strengthening today. She has a cardioversion next week so appointment on Tuesday cancelled and pt will return on Thursdays. Patient encouraged to continue her HEP and follow-up as scheduled.  She would benefit from additional physical therapy services to address deficits in strength and balance in order to return to full function at home.  PT Short Term Goals - 02/17/21 1122       PT SHORT TERM GOAL #1   Title Pt will be independent with HEP in order to improve strength and balance in order to decrease fall risk and improve function at home and when training her dogs    Time 4    Period Weeks    Status On-going    Target Date 03/04/21               PT Long Term Goals - 02/17/21 1122       PT LONG TERM GOAL #1   Title Pt will improve BERG by at least 3 points in order to demonstrate clinically significant improvement in balance.    Baseline 11/30/20: To be completed at next appointment; 12/04/20: 49/56; 02/04/21: 53/56;    Time 12    Period Weeks    Status Achieved      PT LONG TERM GOAL #2   Title Pt will improve ABC by at least 13% in order to demonstrate clinically significant improvement in balance confidence.    Baseline 11/30/20: 67.5%; 02/04/21: 65.625%    Time 12    Period Weeks    Status On-going    Target Date 04/01/21      PT LONG TERM GOAL #3   Title Pt will improve FOTO to at least 55 in order to demonstrate  clinically significant improvement in function related to balance and decrease her risk for falls.    Baseline 11/30/20: 46; 02/04/21: 49    Time 12    Period Weeks    Status Partially Met    Target Date 04/01/21      PT LONG TERM GOAL #4   Title Pt will demonstrate at least 4 point improvement in the FGA in order to demonstrate significant improvement in balance to decrease her fall risk at home and improve safety at home    Baseline 12/03/20:18/30; 02/04/21: 20/30;    Time 12    Period Weeks    Status Partially Met    Target Date 04/01/21      PT LONG TERM GOAL #5   Title Pt will increase self-selected 10MWT by at least 0.13 m/s in order to demonstrate clinically significant improvement in community ambulation.    Baseline 12/03/20: 11.2s = 0.59ms; 02/04/21: 0.76 m/s    Time 12    Period Weeks    Status On-going    Target Date 04/01/21                   Plan - 03/12/21 1207     Clinical Impression Statement Patient demonstrates excellent motivation during session today. She arrived late so session was abbreviated accordingly. Continued with balance exercises and strengthening today. She has a cardioversion next week so appointment on Tuesday cancelled and pt will return on Thursdays. Patient encouraged to continue her HEP and follow-up as scheduled.  She would benefit from additional physical therapy services to address deficits in strength and balance in order to return to full function at home.    Personal Factors and Comorbidities Age;Comorbidity 3+    Comorbidities CHF, A-fib, depression, fibromyalgia, OA    Examination-Activity Limitations Locomotion Level;Stairs;Stand;Transfers    Examination-Participation Restrictions Community Activity;Other   Showing dogs   Stability/Clinical Decision Making Unstable/Unpredictable    Rehab Potential Good    PT Frequency 2x / week    PT Duration 8 weeks    PT Treatment/Interventions ADLs/Self Care Home Management;Aquatic  Therapy;Biofeedback;Canalith Repostioning;Cryotherapy;EDealer  Stimulation;Moist Heat;Iontophoresis 30m/ml Dexamethasone;Traction;Ultrasound;Contrast Bath;DME Instruction;Gait training;Stair training;Functional mobility training;Therapeutic activities;Therapeutic exercise;Balance training;Neuromuscular re-education;Patient/family education;Manual techniques;Passive range of motion;Dry needling;Vestibular;Spinal Manipulations;Joint Manipulations    PT Next Visit Plan Progress balance and strength    PT Home Exercise Plan Access Code: [email or web address]   Consulted and Agree with Plan of Care Patient             Patient will benefit from skilled therapeutic intervention in order to improve the following deficits and impairments:  Decreased balance, Difficulty walking  Visit Diagnosis: Unsteadiness on feet     Problem List Patient Active Problem List   Diagnosis Date Noted   CAP (community acquired pneumonia) 02/23/2021   Chronic diastolic CHF (congestive heart failure) ([place] 02/23/2021   Hypokalemia 02/23/2021   Obesity, Class III, BMI 40-49.9 (morbid obesity) ([place] 027/74/1287  Acute diastolic heart failure ([place] 02/10/2020   Acute respiratory failure with hypoxia ([place] 02/09/2020   Hydronephrosis with renal and ureteral calculus obstruction 02/09/2020   Acute pyelonephritis 086/76/7209  Acute diastolic CHF (congestive heart failure) ([place] 02/09/2020   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/09/2020   AF (paroxysmal atrial fibrillation) ([place] s/p ablation 02/09/2020   Post-op pain 04/08/2018   Post-operative pain 04/06/2018   Atrial fibrillation with RVR ([place] 12/14/2017   Hypothyroidism 12/14/2017   Valvular heart disease 12/14/2017   Muscle cramps 12/14/2017   S/P complete thyroidectomy 08/03/2017    JLyndel SafeHuprich PT, DPT, GCS  Yves Fodor 03/12/2021, 1:46 PM  [place] [facility] [facility] [place] [address]Phone: [phone]   Fax:  [phone] Name: DCressida MilfordMHighpoint HealthMRN: [id number] of Birth: 1May 28, 1952

## 2021-03-16 ENCOUNTER — Ambulatory Visit: Payer: Medicare HMO | Admitting: Anesthesiology

## 2021-03-16 ENCOUNTER — Encounter
Admission: RE | Disposition: A | Discharge status: Home / Self Care | Payer: Self-pay | Source: Home / Self Care | Attending: Internal Medicine

## 2021-03-16 ENCOUNTER — Ambulatory Visit
Admission: RE | Admit: 2021-03-16 | Discharge: 2021-03-16 | Disposition: A | Discharge status: Home / Self Care | Payer: Medicare HMO | Attending: Internal Medicine | Admitting: Internal Medicine

## 2021-03-16 ENCOUNTER — Encounter: Payer: Self-pay | Admitting: Internal Medicine

## 2021-03-16 ENCOUNTER — Other Ambulatory Visit: Payer: Self-pay

## 2021-03-16 DIAGNOSIS — Z6841 Body Mass Index (BMI) 40.0 and over, adult: Secondary | ICD-10-CM | POA: Diagnosis not present

## 2021-03-16 DIAGNOSIS — E669 Obesity, unspecified: Secondary | ICD-10-CM | POA: Diagnosis not present

## 2021-03-16 DIAGNOSIS — Z8249 Family history of ischemic heart disease and other diseases of the circulatory system: Secondary | ICD-10-CM | POA: Insufficient documentation

## 2021-03-16 DIAGNOSIS — I2510 Atherosclerotic heart disease of native coronary artery without angina pectoris: Secondary | ICD-10-CM | POA: Insufficient documentation

## 2021-03-16 DIAGNOSIS — I480 Paroxysmal atrial fibrillation: Secondary | ICD-10-CM | POA: Diagnosis present

## 2021-03-16 DIAGNOSIS — Z79890 Hormone replacement therapy (postmenopausal): Secondary | ICD-10-CM | POA: Insufficient documentation

## 2021-03-16 DIAGNOSIS — I110 Hypertensive heart disease with heart failure: Secondary | ICD-10-CM | POA: Diagnosis not present

## 2021-03-16 DIAGNOSIS — Z7901 Long term (current) use of anticoagulants: Secondary | ICD-10-CM | POA: Insufficient documentation

## 2021-03-16 DIAGNOSIS — I5033 Acute on chronic diastolic (congestive) heart failure: Secondary | ICD-10-CM | POA: Diagnosis not present

## 2021-03-16 DIAGNOSIS — Z79899 Other long term (current) drug therapy: Secondary | ICD-10-CM | POA: Insufficient documentation

## 2021-03-16 DIAGNOSIS — H25019 Cortical age-related cataract, unspecified eye: Secondary | ICD-10-CM | POA: Insufficient documentation

## 2021-03-16 DIAGNOSIS — Z8619 Personal history of other infectious and parasitic diseases: Secondary | ICD-10-CM | POA: Insufficient documentation

## 2021-03-16 DIAGNOSIS — Z9049 Acquired absence of other specified parts of digestive tract: Secondary | ICD-10-CM | POA: Insufficient documentation

## 2021-03-16 DIAGNOSIS — Z87891 Personal history of nicotine dependence: Secondary | ICD-10-CM | POA: Insufficient documentation

## 2021-03-16 DIAGNOSIS — G4730 Sleep apnea, unspecified: Secondary | ICD-10-CM | POA: Diagnosis not present

## 2021-03-16 DIAGNOSIS — I6523 Occlusion and stenosis of bilateral carotid arteries: Secondary | ICD-10-CM | POA: Insufficient documentation

## 2021-03-16 HISTORY — PX: CARDIOVERSION: SHX1299

## 2021-03-16 SURGERY — CARDIOVERSION
Anesthesia: General

## 2021-03-16 MED ORDER — AMIODARONE HCL 200 MG PO TABS: 200.0000 mg | ORAL_TABLET | Freq: Two times a day (BID) | ORAL | 4 refills | Status: DC

## 2021-03-16 MED ORDER — PROPOFOL 10 MG/ML IV BOLUS
INTRAVENOUS | Status: DC | PRN
Start: 2021-03-16 — End: 2021-03-16
  Administered 2021-03-16 (×2): 20 mg via INTRAVENOUS
  Administered 2021-03-16: 60 mg via INTRAVENOUS

## 2021-03-16 MED ORDER — SODIUM CHLORIDE 0.9 % IV SOLN: INTRAVENOUS | Status: DC | PRN

## 2021-03-16 NOTE — Anesthesia Postprocedure Evaluation (Signed)
Anesthesia Post Note  Patient: Mandy Jones [facility]  Procedure(s) Performed: CARDIOVERSION  Patient location during evaluation: Specials Recovery Anesthesia Type: General Level of consciousness: awake and alert Pain management: pain level controlled Vital Signs Assessment: post-procedure vital signs reviewed and stable Respiratory status: spontaneous breathing, nonlabored ventilation, respiratory function stable and patient connected to nasal cannula oxygen Cardiovascular status: blood pressure returned to baseline and stable Postop Assessment: no apparent nausea or vomiting Anesthetic complications: no   No notable events documented.   Last Vitals:  Vitals:   03/16/21 0800 03/16/21 0815  BP: 102/61 (!) 93/49  Pulse: 81 64  Resp: (!) 22 20  Temp:    SpO2: 96% 94%    Last Pain:  Vitals:   03/16/21 0815  TempSrc:   PainSc: 0-No pain                 Arita Miss

## 2021-03-16 NOTE — CV Procedure (Signed)
Electrical Cardioversion Procedure Note Mandy Jones [facility] [id number] [birth year]  Procedure: Electrical Cardioversion Indications:  Paroxysmal non valvular atrial fibrillation  Procedure Details Consent: Risks of procedure as well as the alternatives and risks of each were explained to the (patient/caregiver).  Consent for procedure obtained. Time Out: Verified patient identification, verified procedure, site/side was marked, verified correct patient position, special equipment/implants available, medications/allergies/relevent history reviewed, required imaging and test results available.  Performed  Patient placed on cardiac monitor, pulse oximetry, supplemental oxygen as necessary.  Sedation given: Propofol and versed as per anesthesia  Pacer pads placed anterior and posterior chest.  Cardioverted 1 time(s).  Cardioverted at 120J.  Evaluation Findings: Post procedure EKG shows: NSR Complications: None Patient did   tolerate procedure well.   Mandy Jones M.D. [facility] 03/16/2021, 8:02 AM

## 2021-03-16 NOTE — Anesthesia Preprocedure Evaluation (Signed)
Anesthesia Evaluation  Patient identified by MRN, date of birth, ID band Patient awake    Reviewed: Allergy & Precautions, H&P , NPO status , Patient's Chart, lab work & pertinent test results, reviewed documented beta blocker date and time   History of Anesthesia Complications Negative for: history of anesthetic complications  Airway Mallampati: III  TM Distance: >3 FB Neck ROM: full    Dental  (+) Chipped   Pulmonary shortness of breath, sleep apnea and Continuous Positive Airway Pressure Ventilation , neg COPD, Patient abstained from smoking.Not current smoker, former smoker,    Pulmonary exam normal        Cardiovascular Exercise Tolerance: Good METShypertension, +CHF  (-) CAD and (-) Past MI + dysrhythmias Atrial Fibrillation + pacemaker  Rhythm:irregular Rate:Normal  TTE 2021: 1. Left ventricular ejection fraction, by estimation, is 55 to 60%. Left  ventricular ejection fraction by PLAX is 53 %. The left ventricle has  normal function. Left ventricular endocardial border not optimally defined  to evaluate regional wall motion.  Left ventricular diastolic parameters were normal.  2. Right ventricular systolic function is normal. The right ventricular  size is normal. There is normal pulmonary artery systolic pressure.  3. Left atrial size was mildly dilated.  4. Right atrial size was mildly dilated.  5. The mitral valve was not well visualized. No evidence of mitral valve  regurgitation.  6. The aortic valve was not well visualized. Aortic valve regurgitation  is not visualized.    Neuro/Psych PSYCHIATRIC DISORDERS Depression  Neuromuscular disease    GI/Hepatic GERD  Medicated and Controlled,(+)     (-) substance abuse  , Hepatitis -  Endo/Other  neg diabetesHypothyroidism Morbid obesity  Renal/GU negative Renal ROS     Musculoskeletal  (+) Arthritis , Fibromyalgia -  Abdominal   Peds  Hematology    Anesthesia Other Findings Past Medical History: No date: Arthritis No date: Depression No date: Dyspnea No date: Dysrhythmia 1992: Fibromyalgia No date: GERD (gastroesophageal reflux disease) No date: Hepatitis     Comment:  Hepatitis B 1985 No date: Mitral valve prolapse No date: Sleep apnea     Comment:  CPAP No date: Tendon tear, ankle     Comment:  rt foot No date: Thyroid dysfunction  Past Surgical History: 04/06/2018: ACHILLES TENDON SURGERY; Right     Comment:  Procedure: ACHILLES TENDON REPAIR;  Surgeon: [name], DPM;  Location: [facility];  Service: Podiatry;                Laterality: Right; 02/06/2018: CARDIOVERSION; N/A     Comment:  Procedure: CARDIOVERSION;  Surgeon: Corey Skains,               MD;  Location: [facility];  Service: Cardiovascular;                Laterality: N/A; 03/28/2018: CARDIOVERSION; N/A     Comment:  Procedure: CARDIOVERSION;  Surgeon: Corey Skains,               MD;  Location: [facility];  Service: Cardiovascular;                Laterality: N/A; No date: CHOLECYSTECTOMY 08/03/2017: CONTINUOUS NERVE MONITORING; N/A     Comment:  Procedure: LARYNGEAL NERVE MONITORING;  Surgeon: Carloyn Manner, MD;  Location: [facility];  Service: ENT;                Laterality: N/A; No date: EYE SURGERY; Bilateral 2000: FOOT SURGERY; Left No date: JOINT REPLACEMENT     Comment:  left knee 04/06/2018: OSTECTOMY; Right     Comment:  Procedure: OSTECTOMY-HAGLUNDS/RECTROCALCANEAL;  Surgeon:              [name], DPM;  Location: [facility];  Service:               Podiatry;  Laterality: Right; No date: SHOULDER ARTHROSCOPY; Bilateral No date: SHOULDER ARTHROSCOPY; Left 08/03/2017: THYROIDECTOMY; N/A     Comment:  Procedure: THYROIDECTOMY;  Surgeon: Carloyn Manner,               MD;  Location: [facility];  Service: ENT;  Laterality: N/A; No date: TONSILLECTOMY No date: TUBAL LIGATION      Reproductive/Obstetrics                             Anesthesia Physical  Anesthesia Plan  ASA: 3  Anesthesia Plan: General   Post-op Pain Management:    Induction: Intravenous  PONV Risk Score and Plan: 3 and Treatment may vary due to age or medical condition, TIVA and Propofol infusion  Airway Management Planned: Nasal Cannula and Natural Airway  Additional Equipment: None  Intra-op Plan:   Post-operative Plan:   Informed Consent: I have reviewed the patients History and Physical, chart, labs and discussed the procedure including the risks, benefits and alternatives for the proposed anesthesia with the patient or authorized representative who has indicated his/her understanding and acceptance.     Dental Advisory Given  Plan Discussed with: CRNA  Anesthesia Plan Comments: (Discussed risks of anesthesia with patient, including possibility of difficulty with spontaneous ventilation under anesthesia necessitating airway intervention, PONV, and rare risks such as cardiac or respiratory or neurological events. Patient understands.)        Anesthesia Quick Evaluation

## 2021-03-16 NOTE — Transfer of Care (Signed)
Immediate Anesthesia Transfer of Care Note  Patient: Mandy Jones [facility]  Procedure(s) Performed: CARDIOVERSION  Patient Location: SR 5  Anesthesia Type:General  Level of Consciousness: sedated  Airway & Oxygen Therapy: Patient Spontanous Breathing and Patient connected to nasal cannula oxygen  Post-op Assessment: Report given to RN and Post -op Vital signs reviewed and stable  Post vital signs: Reviewed and stable  Last Vitals:  Vitals Value Taken Time  BP 101/68 03/16/21 0759  Temp    Pulse 80 03/16/21 0759  Resp 24 03/16/21 0759  SpO2 94 % 03/16/21 0759    Last Pain:  Vitals:   03/16/21 0759  TempSrc:   PainSc: 0-No pain         Complications: No notable events documented.

## 2021-03-18 ENCOUNTER — Other Ambulatory Visit: Payer: Self-pay

## 2021-03-18 ENCOUNTER — Ambulatory Visit: Payer: Medicare HMO

## 2021-03-18 VITALS — BP 111/61 | HR 83

## 2021-03-18 DIAGNOSIS — R2681 Unsteadiness on feet: Secondary | ICD-10-CM | POA: Diagnosis not present

## 2021-03-18 NOTE — Therapy (Signed)
[facility] [address] [place], [place], [address] Phone: [phone]   Fax:  [phone]  Physical Therapy Treatment  Patient Details  Name: Mandy Jones [facility] MRN: [id number] Date of Birth: 17-Sep-1950 Referring Provider (PT): Carloyn Manner   Encounter Date: 03/18/2021   PT End of Session - 03/18/21 1152     Visit Number 15    Number of Visits 33    Date for PT Re-Evaluation 04/01/21    Authorization Type eval: 03/02/11, recert: 04/04/85    PT Start Time 1152    PT Stop Time 7672    PT Time Calculation (min) 38 min    Activity Tolerance Patient tolerated treatment well    Behavior During Therapy [facility] for tasks assessed/performed             Past Medical History:  Diagnosis Date   Arrhythmia    atrial fibrillation   Arthritis    CHF (congestive heart failure) ([place])    Depression    Dyspnea    Dysrhythmia    Fibromyalgia 1992   GERD (gastroesophageal reflux disease)    Hepatitis    Hepatitis B 1985   Hyperlipidemia    Hypertension    Mitral valve prolapse    Pacemaker    Pacemaker 01/13/2021   Sleep apnea    CPAP   Tendon tear, ankle    rt foot   Thyroid dysfunction     Past Surgical History:  Procedure Laterality Date   ACHILLES TENDON SURGERY Right 04/06/2018   Procedure: ACHILLES TENDON REPAIR;  Surgeon: [name], DPM;  Location: [facility];  Service: Podiatry;  Laterality: Right;   CARDIOVERSION N/A 02/06/2018   Procedure: CARDIOVERSION;  Surgeon: Corey Skains, MD;  Location: [facility];  Service: Cardiovascular;  Laterality: N/A;   CARDIOVERSION N/A 03/28/2018   Procedure: CARDIOVERSION;  Surgeon: Corey Skains, MD;  Location: [facility];  Service: Cardiovascular;  Laterality: N/A;   CARDIOVERSION N/A 05/09/2018   Procedure: CARDIOVERSION;  Surgeon: Corey Skains, MD;  Location: [facility];  Service: Cardiovascular;  Laterality: N/A;   CARDIOVERSION N/A 10/23/2019   Procedure:  CARDIOVERSION;  Surgeon: Corey Skains, MD;  Location: [place] [facility];  Service: Cardiovascular;  Laterality: N/A;   CARDIOVERSION N/A 09/30/2020   Procedure: CARDIOVERSION;  Surgeon: Corey Skains, MD;  Location: [facility];  Service: Cardiovascular;  Laterality: N/A;   CARDIOVERSION N/A 11/19/2020   Procedure: CARDIOVERSION;  Surgeon: Corey Skains, MD;  Location: [facility];  Service: Cardiovascular;  Laterality: N/A;   CARDIOVERSION N/A 03/16/2021   Procedure: CARDIOVERSION;  Surgeon: Corey Skains, MD;  Location: [facility];  Service: Cardiovascular;  Laterality: N/A;   CHOLECYSTECTOMY     CONTINUOUS NERVE MONITORING N/A 08/03/2017   Procedure: LARYNGEAL NERVE MONITORING;  Surgeon: Carloyn Manner, MD;  Location: [facility];  Service: ENT;  Laterality: N/A;   EXTRACORPOREAL SHOCK WAVE LITHOTRIPSY Right 02/13/2020   Procedure: EXTRACORPOREAL SHOCK WAVE LITHOTRIPSY (ESWL);  Surgeon: Billey Co, MD;  Location: [facility];  Service: Urology;  Laterality: Right;   EYE SURGERY Bilateral    FOOT SURGERY Left 2000   JOINT REPLACEMENT     left knee   OSTECTOMY Right 04/06/2018   Procedure: OSTECTOMY-HAGLUNDS/RECTROCALCANEAL;  Surgeon: [name], DPM;  Location: [facility];  Service: Podiatry;  Laterality: Right;   SHOULDER ARTHROSCOPY Bilateral    SHOULDER ARTHROSCOPY Left    THYROIDECTOMY N/A 08/03/2017   Procedure: THYROIDECTOMY;  Surgeon: Carloyn Manner, MD;  Location:  [facility];  Service: ENT;  Laterality: N/A;   TONSILLECTOMY     TUBAL LIGATION      Vitals:   03/18/21 1155  BP: 111/61  Pulse: 83  SpO2: 96%     Subjective Assessment - 03/18/21 1151     Subjective Pt reports that she is doing alright. She had her cardioversion on Tuesday and it went well. Per pt, cardiology cleared her to return to therapy today. Denies pain currently. No specific questions currently.    Pertinent History Pt referred to vestibular therapy by Dr. Pryor Ochoa for dizziness. She reports "my balance is  off and feel wobbly." Her dizziness started after a fall in September 2021 where she hit the left side of her head and face. She was not diagnosed with a concussion at the time of her injury. She denies any excessive fatigue, difficulty concentrating, headaches, nausea, blurred vision, emotional lability, or disrupted sleep after her fall. She does complain of bilateral tinnitus which started a couple months after the fall. She was seen by Dr. Pryor Ochoa on 10/23/20 who referred her for a VNG study. VNG was positive for abnormal smooth pursuits, saccades, and optokinetic testing. She also had 10 degrees of right beating nystagmus in the body left position and six degrees of left beating nystagmus in the body right position. Calorics were symmetrical. 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 History of cataract removal and macular degeneration. Due to central findings on VNG she was referred by [facility] to neurology but has not yet received a call to schedule. She had a R knee replacement in May 2021 and reports that she has osteoarthritis in both feet. History of CHF with fluctuating weight. She had a cardioversion two weeks ago. Possible ablation next month. 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 Pt has bilateral carpal tunnel which causes numbness in her hands. No numbness in the feet. Her primary complaint at this time is difficulty with her balance.    Limitations Walking    Diagnostic tests See history    Patient Stated Goals Improve balance so she can train and show her dogs with more confidence    Pain Onset --                TREATMENT     Neuromuscular Re-education  All balance exercises performed without UE support unless otherwise noted;   Firm tandem balance alternating forward LE 2 x 30s each; Tandem gait in parallel bars x4 lengths; Airex balance beam tandem gait x 4  lengths; Airex balance beam side stepping x 4 lengths; Airex balance beam side stepping with horizontal and vertical head turns x 4 lengths each; Airex feet together eyes open/eyes closed x30 seconds each; Airex feet together horizontal and vertical head turns x30 seconds each; Alternating 6 inch step taps x10 with each lower extremity; Airex alternating 6 inch step taps 2x10 with each lower extremity;   Pt educated throughout session about proper posture and technique with exercises. Improved exercise technique, movement at target joints, use of target muscles after min to mod verbal, visual, tactile cues.      Patient demonstrates excellent motivation during session today.  Focused on balance exercises today since patient had a cardioversion on Tuesday.  Patient challenged with a variety of conditions including unstable surfaces, eyes closed, and head turning activities.  She demonstrates improved stability today during session.  Patient encouraged to  continue her HEP and follow-up as scheduled.  She would benefit from additional physical therapy services to address deficits in strength and balance in order to return to full function at home.                             PT Short Term Goals - 02/17/21 1122       PT SHORT TERM GOAL #1   Title Pt will be independent with HEP in order to improve strength and balance in order to decrease fall risk and improve function at home and when training her dogs    Time 4    Period Weeks    Status On-going    Target Date 03/04/21               PT Long Term Goals - 02/17/21 1122       PT LONG TERM GOAL #1   Title Pt will improve BERG by at least 3 points in order to demonstrate clinically significant improvement in balance.    Baseline 11/30/20: To be completed at next appointment; 12/04/20: 49/56; 02/04/21: 53/56;    Time 12    Period Weeks    Status Achieved      PT LONG TERM GOAL #2   Title Pt will improve ABC by at  least 13% in order to demonstrate clinically significant improvement in balance confidence.    Baseline 11/30/20: 67.5%; 02/04/21: 65.625%    Time 12    Period Weeks    Status On-going    Target Date 04/01/21      PT LONG TERM GOAL #3   Title Pt will improve FOTO to at least 55 in order to demonstrate clinically significant improvement in function related to balance and decrease her risk for falls.    Baseline 11/30/20: 46; 02/04/21: 49    Time 12    Period Weeks    Status Partially Met    Target Date 04/01/21      PT LONG TERM GOAL #4   Title Pt will demonstrate at least 4 point improvement in the FGA in order to demonstrate significant improvement in balance to decrease her fall risk at home and improve safety at home    Baseline 12/03/20:18/30; 02/04/21: 20/30;    Time 12    Period Weeks    Status Partially Met    Target Date 04/01/21      PT LONG TERM GOAL #5   Title Pt will increase self-selected 10MWT by at least 0.13 m/s in order to demonstrate clinically significant improvement in community ambulation.    Baseline 12/03/20: 11.2s = 0.30ms; 02/04/21: 0.76 m/s    Time 12    Period Weeks    Status On-going    Target Date 04/01/21                   Plan - 03/18/21 1155     Clinical Impression Statement Patient demonstrates excellent motivation during session today.  Focused on balance exercises today since patient had a cardioversion on Tuesday.  Patient challenged with a variety of conditions including unstable surfaces, eyes closed, and head turning activities.  She demonstrates improved stability today during session.  Patient encouraged to continue her HEP and follow-up as scheduled.  She would benefit from additional physical therapy services to address deficits in strength and balance in order to return to full function at home.    Personal Factors and Comorbidities Age;Comorbidity  3+    Comorbidities CHF, A-fib, depression, fibromyalgia, OA    Examination-Activity  Limitations Locomotion Level;Stairs;Stand;Transfers    Examination-Participation Restrictions Community Activity;Other   Showing dogs   Stability/Clinical Decision Making Unstable/Unpredictable    Rehab Potential Good    PT Frequency 2x / week    PT Duration 8 weeks    PT Treatment/Interventions ADLs/Self Care Home Management;Aquatic Therapy;Biofeedback;Canalith Repostioning;Cryotherapy;Electrical Stimulation;Moist Heat;Iontophoresis 48m/ml Dexamethasone;Traction;Ultrasound;Contrast Bath;DME Instruction;Gait training;Stair training;Functional mobility training;Therapeutic activities;Therapeutic exercise;Balance training;Neuromuscular re-education;Patient/family education;Manual techniques;Passive range of motion;Dry needling;Vestibular;Spinal Manipulations;Joint Manipulations    PT Next Visit Plan Progress balance and strength    PT Home Exercise Plan Access Code: [email or web address]   Consulted and Agree with Plan of Care Patient             Patient will benefit from skilled therapeutic intervention in order to improve the following deficits and impairments:  Decreased balance, Difficulty walking  Visit Diagnosis: Unsteadiness on feet     Problem List Patient Active Problem List   Diagnosis Date Noted   CAP (community acquired pneumonia) 02/23/2021   Chronic diastolic CHF (congestive heart failure) ([place] 02/23/2021   Hypokalemia 02/23/2021   Obesity, Class III, BMI 40-49.9 (morbid obesity) ([place] 099/35/7017  Acute diastolic heart failure ([place] 02/10/2020   Acute respiratory failure with hypoxia ([place] 02/09/2020   Hydronephrosis with renal and ureteral calculus obstruction 02/09/2020   Acute pyelonephritis 079/39/0300  Acute diastolic CHF (congestive heart failure) ([place] 02/09/2020   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/09/2020   AF (paroxysmal atrial fibrillation) ([place] s/p ablation 02/09/2020   Post-op pain 04/08/2018   Post-operative pain 04/06/2018   Atrial fibrillation with RVR ([place]  12/14/2017   Hypothyroidism 12/14/2017   Valvular heart disease 12/14/2017   Muscle cramps 12/14/2017   S/P complete thyroidectomy 08/03/2017    JLyndel SafeHuprich PT, DPT, GCS  Lucas Exline 03/18/2021, 3:03 PM  [place] [facility]reen[facility] LLCMMon[facility] LLC[address] [place] [address]Phone: [phone]  Fax:  [phone] Name: DRoosevelt BisherMKindred Hospital - Santa AnaMRN: [id number] of Birth: [birth year]

## 2021-03-25 ENCOUNTER — Ambulatory Visit: Payer: Medicare HMO

## 2021-03-29 ENCOUNTER — Ambulatory Visit: Payer: Medicare HMO | Attending: Otolaryngology

## 2021-03-29 ENCOUNTER — Other Ambulatory Visit: Payer: Self-pay

## 2021-03-29 DIAGNOSIS — R2681 Unsteadiness on feet: Secondary | ICD-10-CM

## 2021-03-29 NOTE — Therapy (Signed)
[facility] [address] [place], [place], [address] Phone: [phone]   Fax:  [phone]  Physical Therapy Treatment  Patient Details  Name: Mandy Jones MRN: [id number] Date of Birth: 17-Feb-1951 Referring Provider (PT): Carloyn Manner   Encounter Date: 03/29/2021   PT End of Session - 03/29/21 1213     Visit Number 16    Number of Visits 33    Date for PT Re-Evaluation 04/01/21    Authorization Type eval: 03/05/92, recert: 03/29/00    PT Start Time 1205    PT Stop Time 1230    PT Time Calculation (min) 25 min    Activity Tolerance Patient tolerated treatment well    Behavior During Therapy [facility] for tasks assessed/performed             Past Medical History:  Diagnosis Date   Arrhythmia    atrial fibrillation   Arthritis    CHF (congestive heart failure) ([place])    Depression    Dyspnea    Dysrhythmia    Fibromyalgia 1992   GERD (gastroesophageal reflux disease)    Hepatitis    Hepatitis B 1985   Hyperlipidemia    Hypertension    Mitral valve prolapse    Pacemaker    Pacemaker 01/13/2021   Sleep apnea    CPAP   Tendon tear, ankle    rt foot   Thyroid dysfunction     Past Surgical History:  Procedure Laterality Date   ACHILLES TENDON SURGERY Right 04/06/2018   Procedure: ACHILLES TENDON REPAIR;  Surgeon: [name], DPM;  Location: [facility];  Service: Podiatry;  Laterality: Right;   CARDIOVERSION N/A 02/06/2018   Procedure: CARDIOVERSION;  Surgeon: Corey Skains, MD;  Location: [facility];  Service: Cardiovascular;  Laterality: N/A;   CARDIOVERSION N/A 03/28/2018   Procedure: CARDIOVERSION;  Surgeon: Corey Skains, MD;  Location: [facility];  Service: Cardiovascular;  Laterality: N/A;   CARDIOVERSION N/A 05/09/2018   Procedure: CARDIOVERSION;  Surgeon: Corey Skains, MD;  Location: [facility];  Service: Cardiovascular;  Laterality: N/A;   CARDIOVERSION N/A 10/23/2019   Procedure:  CARDIOVERSION;  Surgeon: Corey Skains, MD;  Location: [place] [facility];  Service: Cardiovascular;  Laterality: N/A;   CARDIOVERSION N/A 09/30/2020   Procedure: CARDIOVERSION;  Surgeon: Corey Skains, MD;  Location: [facility];  Service: Cardiovascular;  Laterality: N/A;   CARDIOVERSION N/A 11/19/2020   Procedure: CARDIOVERSION;  Surgeon: Corey Skains, MD;  Location: [facility];  Service: Cardiovascular;  Laterality: N/A;   CARDIOVERSION N/A 03/16/2021   Procedure: CARDIOVERSION;  Surgeon: Corey Skains, MD;  Location: [facility];  Service: Cardiovascular;  Laterality: N/A;   CHOLECYSTECTOMY     CONTINUOUS NERVE MONITORING N/A 08/03/2017   Procedure: LARYNGEAL NERVE MONITORING;  Surgeon: Carloyn Manner, MD;  Location: [facility];  Service: ENT;  Laterality: N/A;   EXTRACORPOREAL SHOCK WAVE LITHOTRIPSY Right 02/13/2020   Procedure: EXTRACORPOREAL SHOCK WAVE LITHOTRIPSY (ESWL);  Surgeon: Billey Co, MD;  Location: [facility];  Service: Urology;  Laterality: Right;   EYE SURGERY Bilateral    FOOT SURGERY Left 2000   JOINT REPLACEMENT     left knee   OSTECTOMY Right 04/06/2018   Procedure: OSTECTOMY-HAGLUNDS/RECTROCALCANEAL;  Surgeon: [name], DPM;  Location: [facility];  Service: Podiatry;  Laterality: Right;   SHOULDER ARTHROSCOPY Bilateral    SHOULDER ARTHROSCOPY Left    THYROIDECTOMY N/A 08/03/2017   Procedure: THYROIDECTOMY;  Surgeon: Carloyn Manner, MD;  Location:  [facility];  Service: ENT;  Laterality: N/A;   TONSILLECTOMY     TUBAL LIGATION      There were no vitals filed for this visit.    Subjective Assessment - 03/29/21 1208     Subjective Pt reports that she is doing alright.  Denies any chest pain or palpitations. She saw the cardiologist last week who told her that she is still in atrial flutter s/p cardioversion. She is being referred back to the electrophysiologist to discuss alternative options such as a repeat ablation. Denies pain currently. No specific questions  currently.    Pertinent History Pt referred to vestibular therapy by Dr. Pryor Ochoa for dizziness. She reports "my balance is off and feel wobbly." Her dizziness started after a fall in September 2021 where she hit the left side of her head and face. She was not diagnosed with a concussion at the time of her injury. She denies any excessive fatigue, difficulty concentrating, headaches, nausea, blurred vision, emotional lability, or disrupted sleep after her fall. She does complain of bilateral tinnitus which started a couple months after the fall. She was seen by Dr. Pryor Ochoa on 10/23/20 who referred her for a VNG study. VNG was positive for abnormal smooth pursuits, saccades, and optokinetic testing. She also had 10 degrees of right beating nystagmus in the body left position and six degrees of left beating nystagmus in the body right position. Calorics were symmetrical. 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 History of cataract removal and macular degeneration. Due to central findings on VNG she was referred by [facility] to neurology but has not yet received a call to schedule. She had a R knee replacement in May 2021 and reports that she has osteoarthritis in both feet. History of CHF with fluctuating weight. She had a cardioversion two weeks ago. Possible ablation next month. 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 Pt has bilateral carpal tunnel which causes numbness in her hands. No numbness in the feet. Her primary complaint at this time is difficulty with her balance.    Limitations Walking    Diagnostic tests See history    Patient Stated Goals Improve balance so she can train and show her dogs with more confidence                TREATMENT     Neuromuscular Re-education  All balance exercises performed without UE support unless otherwise noted;   Feet together eyes open/eyes  closed x30 seconds each; Feet together eyes open with horizontal head turns x30 seconds; Firm tandem balance alternating forward LE 2 x 30s each; Tandem gait in parallel bars x4 lengths; Alternating 6 inch cone taps x10 with each lower extremity; Airex alternating 6 cone taps 2x10 with each lower extremity;   Ther-ex  NuStep L2-4 x 5 minutes for warm-up during history 2 minutes unbilled; Sit to stand from regular height chair  x 1 (pt is too fatigued to complete); Mini squats with bilateral upper extremity support x10; Seated clams with green Thera-Band x1 minute; Seated adductor ball squeezes 3-second hold x1 minute;    Pt educated throughout session about proper posture and technique with exercises. Improved exercise technique, movement at target joints, use of target muscles after min to mod verbal, visual, tactile cues.      Patient demonstrates excellent motivation during session today.  She arrived late so session was unfortunately abbreviated.  Focused  on combination of both balance and strength exercises during session.  She does appear slightly more fatigued today however overall demonstrates good stability.  Patient is tearful and frustrated at the end of session feel that she has regressed with respect to her balance and strength.  Encouragement provided to patient.  Due to financial constraints patient reports that she will need to discharge after next visit.  Will update outcome measures and goals at next session and plan on discharging.  Patient provided revised HEP today which included additional exercises that she can perform at home independently.  Patient encouraged to follow-up as scheduled.  She would benefit from additional physical therapy services to address deficits in strength and balance in order to return to full function at home.                             PT Short Term Goals - 02/17/21 1122       PT SHORT TERM GOAL #1   Title Pt will be  independent with HEP in order to improve strength and balance in order to decrease fall risk and improve function at home and when training her dogs    Time 4    Period Weeks    Status On-going    Target Date 03/04/21               PT Long Term Goals - 02/17/21 1122       PT LONG TERM GOAL #1   Title Pt will improve BERG by at least 3 points in order to demonstrate clinically significant improvement in balance.    Baseline 11/30/20: To be completed at next appointment; 12/04/20: 49/56; 02/04/21: 53/56;    Time 12    Period Weeks    Status Achieved      PT LONG TERM GOAL #2   Title Pt will improve ABC by at least 13% in order to demonstrate clinically significant improvement in balance confidence.    Baseline 11/30/20: 67.5%; 02/04/21: 65.625%    Time 12    Period Weeks    Status On-going    Target Date 04/01/21      PT LONG TERM GOAL #3   Title Pt will improve FOTO to at least 55 in order to demonstrate clinically significant improvement in function related to balance and decrease her risk for falls.    Baseline 11/30/20: 46; 02/04/21: 49    Time 12    Period Weeks    Status Partially Met    Target Date 04/01/21      PT LONG TERM GOAL #4   Title Pt will demonstrate at least 4 point improvement in the FGA in order to demonstrate significant improvement in balance to decrease her fall risk at home and improve safety at home    Baseline 12/03/20:18/30; 02/04/21: 20/30;    Time 12    Period Weeks    Status Partially Met    Target Date 04/01/21      PT LONG TERM GOAL #5   Title Pt will increase self-selected 10MWT by at least 0.13 m/s in order to demonstrate clinically significant improvement in community ambulation.    Baseline 12/03/20: 11.2s = 0.83m/s; 02/04/21: 0.76 m/s    Time 12    Period Weeks    Status On-going    Target Date 04/01/21                   Plan -  03/29/21 1214     Clinical Impression Statement Patient demonstrates excellent motivation during session  today.  She arrived late so session was unfortunately abbreviated.  Focused on combination of both balance and strength exercises during session.  She does appear slightly more fatigued today however overall demonstrates good stability.  Patient is tearful and frustrated at the end of session feel that she has regressed with respect to her balance and strength.  Encouragement provided to patient.  Due to financial constraints patient reports that she will need to discharge after next visit.  Will update outcome measures and goals at next session and plan on discharging.  Patient provided revised HEP today which included additional exercises that she can perform at home independently.  Patient encouraged to follow-up as scheduled.  She would benefit from additional physical therapy services to address deficits in strength and balance in order to return to full function at home.    Personal Factors and Comorbidities Age;Comorbidity 3+    Comorbidities CHF, A-fib, depression, fibromyalgia, OA    Examination-Activity Limitations Locomotion Level;Stairs;Stand;Transfers    Examination-Participation Restrictions Community Activity;Other   Showing dogs   Stability/Clinical Decision Making Unstable/Unpredictable    Rehab Potential Good    PT Frequency 2x / week    PT Duration 8 weeks    PT Treatment/Interventions ADLs/Self Care Home Management;Aquatic Therapy;Biofeedback;Canalith Repostioning;Cryotherapy;Electrical Stimulation;Moist Heat;Iontophoresis 44m/ml Dexamethasone;Traction;Ultrasound;Contrast Bath;DME Instruction;Gait training;Stair training;Functional mobility training;Therapeutic activities;Therapeutic exercise;Balance training;Neuromuscular re-education;Patient/family education;Manual techniques;Passive range of motion;Dry needling;Vestibular;Spinal Manipulations;Joint Manipulations    PT Next Visit Plan Progress balance and strength    PT Home Exercise Plan Access Code: [email or web address]   Consulted and Agree  with Plan of Care Patient             Patient will benefit from skilled therapeutic intervention in order to improve the following deficits and impairments:  Decreased balance, Difficulty walking  Visit Diagnosis: Unsteadiness on feet     Problem List Patient Active Problem List   Diagnosis Date Noted   CAP (community acquired pneumonia) 02/23/2021   Chronic diastolic CHF (congestive heart failure) ([place] 02/23/2021   Hypokalemia 02/23/2021   Obesity, Class III, BMI 40-49.9 (morbid obesity) ([place] 035/45/6256  Acute diastolic heart failure ([place] 02/10/2020   Acute respiratory failure with hypoxia ([place] 02/09/2020   Hydronephrosis with renal and ureteral calculus obstruction 02/09/2020   Acute pyelonephritis 038/93/7342  Acute diastolic CHF (congestive heart failure) ([place] 02/09/2020   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/09/2020   AF (paroxysmal atrial fibrillation) ([place] s/p ablation 02/09/2020   Post-op pain 04/08/2018   Post-operative pain 04/06/2018   Atrial fibrillation with RVR ([place] 12/14/2017   Hypothyroidism 12/14/2017   Valvular heart disease 12/14/2017   Muscle cramps 12/14/2017   S/P complete thyroidectomy 08/03/2017    JLyndel SafeHuprich PT, DPT, GCS  Mandy Jones 03/29/2021, 1:42 PM  [facility] [place] [address]Phone: [phone]  Fax:  [phone] Name: DMaxene ByingtonMKentucky River Medical CenterMRN: [id number] of Birth: [birth year]

## 2021-03-29 NOTE — Patient Instructions (Signed)
Access Code: [email or web address] URL: [email or web address] Date: 03/29/2021 Prepared by: Roxana Hires  Exercises Standing Tandem Balance with Counter Support - 1 x daily - 7 x weekly - 30s x 3 on each side hold Standing Single Leg Stance with Counter Support - 1 x daily - 7 x weekly - 3 x 30s on each leg hold Romberg Stance with Head Rotation - 1 x daily - 7 x weekly - 3 x 30s hold Standing March with Counter Support - 1 x daily - 7 x weekly - 2 sets - 10 reps - 3s hold Standing Hip Abduction with Counter Support - 1 x daily - 7 x weekly - 2 sets - 10 reps - 3s hold Seated Hip Adduction Squeeze with Ball - 1 x daily - 7 x weekly - 2 sets - 10 reps - 3s hold Sit to Stand with Hands on Knees - 1 x daily - 7 x weekly - 2 sets - 10 reps

## 2021-03-31 ENCOUNTER — Ambulatory Visit: Payer: Medicare HMO | Admitting: Family

## 2021-04-08 ENCOUNTER — Ambulatory Visit: Payer: Medicare HMO

## 2021-04-09 ENCOUNTER — Ambulatory Visit: Payer: Medicare HMO | Admitting: Family

## 2021-04-13 ENCOUNTER — Ambulatory Visit: Payer: Medicare HMO

## 2021-04-13 ENCOUNTER — Other Ambulatory Visit: Payer: Self-pay

## 2021-04-13 VITALS — BP 95/63 | HR 120

## 2021-04-13 DIAGNOSIS — R2681 Unsteadiness on feet: Secondary | ICD-10-CM

## 2021-04-13 NOTE — Therapy (Signed)
[facility] [address] [place], [place], [address] Phone: [phone]   Fax:  [phone]  Physical Therapy Treatment/Discharge  Patient Details  Name: Mandy Jones Date of Birth: [birth year] Referring Provider (PT): Carloyn Manner   Encounter Date: 04/13/2021   PT End of Session - 04/13/21 1825     Visit Number 17    Number of Visits 34    Date for PT Re-Evaluation 06/01/21    Authorization Type eval: 1/0/93, recert: 10/01/53, recert 7/32/20    PT Start Time 2542    PT Stop Time 7062    PT Time Calculation (min) 35 min    Activity Tolerance Patient tolerated treatment well    Behavior During Therapy [facility] for tasks assessed/performed              Past Medical History:  Diagnosis Date   Arrhythmia    atrial fibrillation   Arthritis    CHF (congestive heart failure) ([place])    Depression    Dyspnea    Dysrhythmia    Fibromyalgia 1992   GERD (gastroesophageal reflux disease)    Hepatitis    Hepatitis B 1985   Hyperlipidemia    Hypertension    Mitral valve prolapse    Pacemaker    Pacemaker 01/13/2021   Sleep apnea    CPAP   Tendon tear, ankle    rt foot   Thyroid dysfunction     Past Surgical History:  Procedure Laterality Date   ACHILLES TENDON SURGERY Right 04/06/2018   Procedure: ACHILLES TENDON REPAIR;  Surgeon: [name], DPM;  Location: [facility];  Service: Podiatry;  Laterality: Right;   CARDIOVERSION N/A 02/06/2018   Procedure: CARDIOVERSION;  Surgeon: Corey Skains, MD;  Location: [facility];  Service: Cardiovascular;  Laterality: N/A;   CARDIOVERSION N/A 03/28/2018   Procedure: CARDIOVERSION;  Surgeon: Corey Skains, MD;  Location: [facility];  Service: Cardiovascular;  Laterality: N/A;   CARDIOVERSION N/A 05/09/2018   Procedure: CARDIOVERSION;  Surgeon: Corey Skains, MD;  Location: [facility];  Service: Cardiovascular;  Laterality: N/A;   CARDIOVERSION N/A  10/23/2019   Procedure: CARDIOVERSION;  Surgeon: Corey Skains, MD;  Location: [facility];  Service: Cardiovascular;  Laterality: N/A;   CARDIOVERSION N/A 09/30/2020   Procedure: CARDIOVERSION;  Surgeon: Corey Skains, MD;  Location: [facility];  Service: Cardiovascular;  Laterality: N/A;   CARDIOVERSION N/A 11/19/2020   Procedure: CARDIOVERSION;  Surgeon: Corey Skains, MD;  Location: [facility];  Service: Cardiovascular;  Laterality: N/A;   CARDIOVERSION N/A 03/16/2021   Procedure: CARDIOVERSION;  Surgeon: Corey Skains, MD;  Location: [facility];  Service: Cardiovascular;  Laterality: N/A;   CHOLECYSTECTOMY     CONTINUOUS NERVE MONITORING N/A 08/03/2017   Procedure: LARYNGEAL NERVE MONITORING;  Surgeon: Carloyn Manner, MD;  Location: [facility];  Service: ENT;  Laterality: N/A;   EXTRACORPOREAL SHOCK WAVE LITHOTRIPSY Right 02/13/2020   Procedure: EXTRACORPOREAL SHOCK WAVE LITHOTRIPSY (ESWL);  Surgeon: Billey Co, MD;  Location: [facility];  Service: Urology;  Laterality: Right;   EYE SURGERY Bilateral    FOOT SURGERY Left 2000   JOINT REPLACEMENT     left knee   OSTECTOMY Right 04/06/2018   Procedure: OSTECTOMY-HAGLUNDS/RECTROCALCANEAL;  Surgeon: [name], DPM;  Location: [facility];  Service: Podiatry;  Laterality: Right;   SHOULDER ARTHROSCOPY Bilateral    SHOULDER ARTHROSCOPY Left    THYROIDECTOMY N/A 08/03/2017   Procedure: THYROIDECTOMY;  Surgeon: Carloyn Manner,  MD;  Location: [facility];  Service: ENT;  Laterality: N/A;   TONSILLECTOMY     TUBAL LIGATION      Vitals:   04/13/21 1808  BP: 95/63  Pulse: (!) 120  SpO2: 99%      Subjective Assessment - 04/13/21 1824     Subjective Pt reports that she is doing alright.  Denies any chest pain or palpitations upon arrival. She saw the electrophysiologist who reports that she is in atrial fibrillation and gave her the option of another ablation.  Given the possibility of another ablation not being successful controlling  atrial fibrillation patient has decided not to proceed.  She reports that her insurance is currently denying approval for cardiac rehab.    Pertinent History Pt referred to vestibular therapy by Dr. Pryor Ochoa for dizziness. She reports "my balance is off and feel wobbly." Her dizziness started after a fall in September 2021 where she hit the left side of her head and face. She was not diagnosed with a concussion at the time of her injury. She denies any excessive fatigue, difficulty concentrating, headaches, nausea, blurred vision, emotional lability, or disrupted sleep after her fall. She does complain of bilateral tinnitus which started a couple months after the fall. She was seen by Dr. Pryor Ochoa on 10/23/20 who referred her for a VNG study. VNG was positive for abnormal smooth pursuits, saccades, and optokinetic testing. She also had 10 degrees of right beating nystagmus in the body left position and six degrees of left beating nystagmus in the body right position. Calorics were symmetrical. 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 History of cataract removal and macular degeneration. Due to central findings on VNG she was referred by [facility] to neurology but has not yet received a call to schedule. She had a R knee replacement in May 2021 and reports that she has osteoarthritis in both feet. History of CHF with fluctuating weight. She had a cardioversion two weeks ago. Possible ablation next month. 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 Pt has bilateral carpal tunnel which causes numbness in her hands. No numbness in the feet. Her primary complaint at this time is difficulty with her balance.    Limitations Walking    Diagnostic tests See history    Patient Stated Goals Improve balance so she can train and show her dogs with more confidence                 TREATMENT     Neuromuscular  Re-education  Updated outcome measures with patient: ABC: 63.75% FOTO: 49 72mgait speed: Self-selected: 11.8s = 0.85 m/s, Fastest: 10.3s = 0.97 m/s Monitored vitals as heart rate remains elevated around 122 bpm for extended duration of time.  Patient educated about monitoring vitals at home and reporting to her cardiologist at the next follow-up visit.  Educated regarding signs and symptoms of uncontrolled atrial fibrillation and encouraged to seek emergency medical attention if symptoms develop. HEP review with education regarding exercise progression/regression;   Pt educated throughout session about proper posture and technique with exercises. Improved exercise technique, movement at target joints, use of target muscles after min to mod verbal, visual, tactile cues.       Pt arrived late to appointment so session was abbreviated. Updated outcome measures with patient today. Her ABC was decreased to 63.75% and her self-selected 154mait speed decreased as well. Her FOTO remained  unchanged at 49. She continues to report staggering and imbalance during gait.  Overall she made minimal progress during therapy and will have to stop due to financial constraints.  Unfortunately patient reports that her health insurance has denied cardiac rehab which would be very beneficial for her with respect to improving her cardiovascular endurance.  She plans to call the insurance company to appeal the denial.  Patient's heart rate today remains elevated around 122 bpm for an extended duration at rest as counted by therapist by both a radial pulse as well as auscultation on the chest with a stethoscope. It does eventually come back down to the 60s-70s bpm. Her blood pressure is also low currently. Patient encouraged to monitor her vitals at home as the electrophysiologist discontinued her amiodarone at her visit. He suggested she might benefit from a beta-blocker for heart rate control but did not start her on any new  medications and she does not follow-up with her cardiologist for another few weeks.  Patient will be discharged on this date and encouraged to follow-up with her PCP for any additional issues.                           PT Short Term Goals - 04/14/21 1436       PT SHORT TERM GOAL #1   Title Pt will be independent with HEP in order to improve strength and balance in order to decrease fall risk and improve function at home and when training her dogs    Time 4    Period Weeks    Status Achieved               PT Long Term Goals - 04/14/21 1436       PT LONG TERM GOAL #1   Title Pt will improve BERG by at least 3 points in order to demonstrate clinically significant improvement in balance.    Baseline 11/30/20: To be completed at next appointment; 12/04/20: 49/56; 02/04/21: 53/56;    Time 12    Period Weeks    Status Achieved      PT LONG TERM GOAL #2   Title Pt will improve ABC by at least 13% in order to demonstrate clinically significant improvement in balance confidence.    Baseline 11/30/20: 67.5%; 02/04/21: 65.625%; 04/14/21: 63.75%    Time 12    Period Weeks    Status Not Met      PT LONG TERM GOAL #3   Title Pt will improve FOTO to at least 55 in order to demonstrate clinically significant improvement in function related to balance and decrease her risk for falls.    Baseline 11/30/20: 46; 02/04/21: 49; 04/13/21: 49    Time 12    Period Weeks    Status Partially Met      PT LONG TERM GOAL #4   Title Pt will demonstrate at least 4 point improvement in the FGA in order to demonstrate significant improvement in balance to decrease her fall risk at home and improve safety at home    Baseline 12/03/20:18/30; 02/04/21: 20/30;    Time 12    Period Weeks    Status Partially Met      PT LONG TERM GOAL #5   Title Pt will increase self-selected 10MWT by at least 0.13 m/s in order to demonstrate clinically significant improvement in community ambulation.    Baseline  12/03/20: 11.2s = 0.78ms; 02/04/21: 0.76 m/s; 04/14/21: 0.85 m/s  Time 12    Period Weeks    Status Not Met                   Plan - 04/13/21 1825     Clinical Impression Statement Pt arrived late to appointment so session was abbreviated. Updated outcome measures with patient today. Her ABC was decreased to 63.75% and her self-selected 70mgait speed decreased as well. Her FOTO remained unchanged at 49. She continues to report staggering and imbalance during gait.  Overall she made minimal progress during therapy and will have to stop due to financial constraints.  Unfortunately patient reports that her health insurance has denied cardiac rehab which would be very beneficial for her with respect to improving her cardiovascular endurance.  She plans to call the insurance company to appeal the denial.  Patient's heart rate today remains elevated around 122 bpm for an extended duration at rest as counted by therapist by both a radial pulse as well as auscultation on the chest with a stethoscope. It does eventually come back down to the 60s-70s bpm. Her blood pressure is also low currently. Patient encouraged to monitor her vitals at home as the electrophysiologist discontinued her amiodarone at her visit. He suggested she might benefit from a beta-blocker for heart rate control but did not start her on any new medications and she does not follow-up with her cardiologist for another few weeks.  Patient will be discharged on this date and encouraged to follow-up with her PCP for any additional issues.    Personal Factors and Comorbidities Age;Comorbidity 3+    Comorbidities CHF, A-fib, depression, fibromyalgia, OA    Examination-Activity Limitations Locomotion Level;Stairs;Stand;Transfers    Examination-Participation Restrictions Community Activity;Other   Showing dogs   Stability/Clinical Decision Making Unstable/Unpredictable    Rehab Potential Good    PT Frequency One time visit    PT Duration  --   One week   PT Treatment/Interventions ADLs/Self Care Home Management;Aquatic Therapy;Biofeedback;Canalith Repostioning;Cryotherapy;Electrical Stimulation;Moist Heat;Iontophoresis 480mml Dexamethasone;Traction;Ultrasound;Contrast Bath;DME Instruction;Gait training;Stair training;Functional mobility training;Therapeutic activities;Therapeutic exercise;Balance training;Neuromuscular re-education;Patient/family education;Manual techniques;Passive range of motion;Dry needling;Vestibular;Spinal Manipulations;Joint Manipulations    PT Next Visit Plan Discharge    PT Home Exercise Plan Access Code: [email or web address]  Consulted and Agree with Plan of Care Patient              Patient will benefit from skilled therapeutic intervention in order to improve the following deficits and impairments:  Decreased balance, Difficulty walking  Visit Diagnosis: Unsteadiness on feet - Plan: PT plan of care cert/re-cert     Problem List Patient Active Problem List   Diagnosis Date Noted   CAP (community acquired pneumonia) 02/23/2021   Chronic diastolic CHF (congestive heart failure) ([facility]06/28/2022   Hypokalemia 02/23/2021   Obesity, Class III, BMI 40-49.9 (morbid obesity) (HC[facility] [id number]/70/[id number] Acute diastolic heart failure ([facility]06/14/2021   Acute respiratory failure with hypoxia ([facility]06/13/2021   Hydronephrosis with renal and ureteral calculus obstruction 02/09/2020   Acute pyelonephritis 0636/62/9476 Acute diastolic CHF (congestive heart failure) ([facility]06/13/2021   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/09/2020   AF (paroxysmal atrial fibrillation) ([facility]/p ablation 02/09/2020   Post-op pain 04/08/2018   Post-operative pain 04/06/2018   Atrial fibrillation with RVR ([facility]04/18/2019   Hypothyroidism 12/14/2017   Valvular heart disease 12/14/2017   Muscle cramps 12/14/2017   S/P complete thyroidectomy 08/03/2017    JaLyndel Safeuprich PT, DPT, GCS  Graelyn Bihl 04/14/2021, 2:49 PM  [facility] [address] [place], [place], [address] Phone: [phone]   Fax:  [phone]  Name: Mandy Jones [facility] MRN: [id number] Date of Birth: 13-Mar-1951

## 2021-04-21 ENCOUNTER — Other Ambulatory Visit: Payer: Self-pay

## 2021-04-21 ENCOUNTER — Other Ambulatory Visit
Admission: RE | Admit: 2021-04-21 | Discharge: 2021-04-21 | Disposition: A | Discharge status: Home / Self Care | Payer: Medicare HMO | Source: Ambulatory Visit | Attending: Family | Admitting: Family

## 2021-04-21 ENCOUNTER — Ambulatory Visit (HOSPITAL_BASED_OUTPATIENT_CLINIC_OR_DEPARTMENT_OTHER): Payer: Medicare HMO | Admitting: Family

## 2021-04-21 ENCOUNTER — Encounter: Payer: Self-pay | Admitting: Family

## 2021-04-21 VITALS — BP 140/80 | HR 76 | Resp 18 | Ht 62.0 in | Wt 228.5 lb

## 2021-04-21 DIAGNOSIS — I5022 Chronic systolic (congestive) heart failure: Secondary | ICD-10-CM

## 2021-04-21 DIAGNOSIS — I10 Essential (primary) hypertension: Secondary | ICD-10-CM

## 2021-04-21 DIAGNOSIS — I480 Paroxysmal atrial fibrillation: Secondary | ICD-10-CM

## 2021-04-21 LAB — BASIC METABOLIC PANEL
Anion gap: 7 (ref 5–15)
BUN: 17 mg/dL (ref 8–23)
CO2: 29 mmol/L (ref 22–32)
Calcium: 9.4 mg/dL (ref 8.9–10.3)
Chloride: 103 mmol/L (ref 98–111)
Creatinine, Ser: 0.74 mg/dL (ref 0.44–1.00)
GFR, Estimated: >60 mL/min (ref >60)
Glucose, Bld: 102 mg/dL — ABNORMAL HIGH (ref 70–99)
Potassium: 4.2 mmol/L (ref 3.5–5.1)
Sodium: 139 mmol/L (ref 135–145)

## 2021-04-21 MED ORDER — METOPROLOL SUCCINATE ER 25 MG PO TB24: 25.0000 mg | ORAL_TABLET | Freq: Every day | ORAL | 5 refills | Status: DC

## 2021-04-21 NOTE — Progress Notes (Signed)
Patient ID: Mandy Jones, female    DOB: Apr 23, 1951, 70 y.o.   MRN: [id number]  HPI  Mandy Jones is a 70 y/o female with a history of atrial fibrillation, HTN, thyroid disease, GERD, sleep apnea, depression, fibromyalgia, hyperlipidemia, previous tobacco use and chronic heart failure.   Echo report from 10/15/20 reviewed and showed an EF of 40%. Echo report from 02/10/20 reviewed and showed an EF of 55-60% along with mild LAE.   Admitted 02/23/21 due to fever, cough & shortness of breath due to pneumonia. IV antibiotics initially given with transition to oral medication. Discharged after 2 days.   She presents today for a follow-up visit with a chief complaint of minimal shortness of breath upon moderate exertion. She describes this as chronic in nature having been present for several years. She has associated fatigue, cough, pedal edema, abdominal distention, dizziness, difficulty sleeping and chronic pain along with this. She denies any palpitations, chest pain or weight gain gain.   She says that her amiodarone has been stopped because she continues to be in atrial fibrillation. Previous cardioversions and ablations don't help either.   She says that her insurance denied her participating in cardiac rehab because she hadn't had a recent MI or other heart disease.   She also says that she reached the donut hole back in April 2022 and her monthly medications cost upper $300's and it's mostly due to the eliquis and entresto and she's unsure of how much longer she can pay for these.   Past Medical History:  Diagnosis Date   Arrhythmia    atrial fibrillation   Arthritis    CHF (congestive heart failure) (HCC)    Depression    Dyspnea    Dysrhythmia    Fibromyalgia 1992   GERD (gastroesophageal reflux disease)    Hepatitis    Hepatitis B 1985   Hyperlipidemia    Hypertension    Mitral valve prolapse    Pacemaker    Pacemaker 01/13/2021   Sleep apnea    CPAP   Tendon tear,  ankle    rt foot   Thyroid dysfunction    Past Surgical History:  Procedure Laterality Date   ACHILLES TENDON SURGERY Right 04/06/2018   Procedure: ACHILLES TENDON REPAIR;  Surgeon: [name], DPM;  Location: [facility];  Service: Podiatry;  Laterality: Right;   CARDIOVERSION N/A 02/06/2018   Procedure: CARDIOVERSION;  Surgeon: Corey Skains, MD;  Location: [facility];  Service: Cardiovascular;  Laterality: N/A;   CARDIOVERSION N/A 03/28/2018   Procedure: CARDIOVERSION;  Surgeon: Corey Skains, MD;  Location: [facility];  Service: Cardiovascular;  Laterality: N/A;   CARDIOVERSION N/A 05/09/2018   Procedure: CARDIOVERSION;  Surgeon: Corey Skains, MD;  Location: [facility];  Service: Cardiovascular;  Laterality: N/A;   CARDIOVERSION N/A 10/23/2019   Procedure: CARDIOVERSION;  Surgeon: Corey Skains, MD;  Location: [facility];  Service: Cardiovascular;  Laterality: N/A;   CARDIOVERSION N/A 09/30/2020   Procedure: CARDIOVERSION;  Surgeon: Corey Skains, MD;  Location: [facility];  Service: Cardiovascular;  Laterality: N/A;   CARDIOVERSION N/A 11/19/2020   Procedure: CARDIOVERSION;  Surgeon: Corey Skains, MD;  Location: [facility];  Service: Cardiovascular;  Laterality: N/A;   CARDIOVERSION N/A 03/16/2021   Procedure: CARDIOVERSION;  Surgeon: Corey Skains, MD;  Location: [facility];  Service: Cardiovascular;  Laterality: N/A;   CHOLECYSTECTOMY     CONTINUOUS NERVE MONITORING N/A 08/03/2017   Procedure: LARYNGEAL NERVE MONITORING;  Surgeon: Carloyn Manner,  MD;  Location: [facility];  Service: ENT;  Laterality: N/A;   EXTRACORPOREAL SHOCK WAVE LITHOTRIPSY Right 02/13/2020   Procedure: EXTRACORPOREAL SHOCK WAVE LITHOTRIPSY (ESWL);  Surgeon: Billey Co, MD;  Location: [facility];  Service: Urology;  Laterality: Right;   EYE SURGERY Bilateral    FOOT SURGERY Left 2000   JOINT REPLACEMENT     left knee   OSTECTOMY Right 04/06/2018   Procedure: OSTECTOMY-HAGLUNDS/RECTROCALCANEAL;   Surgeon: [name], DPM;  Location: [facility];  Service: Podiatry;  Laterality: Right;   SHOULDER ARTHROSCOPY Bilateral    SHOULDER ARTHROSCOPY Left    THYROIDECTOMY N/A 08/03/2017   Procedure: THYROIDECTOMY;  Surgeon: Carloyn Manner, MD;  Location: [facility];  Service: ENT;  Laterality: N/A;   TONSILLECTOMY     TUBAL LIGATION     Family History  Problem Relation Age of Onset   Hypertension Mother    Gastric cancer Father    Social History   Tobacco Use   Smoking status: Former   Smokeless tobacco: Never   Tobacco comments:    quit 30 years ago  Substance Use Topics   Alcohol use: Yes    Comment: occassional   Allergies  Allergen Reactions   Silver Other (See Comments)     Causes blisters.   Tape Rash    Pads used for ablation left a rash for a couple of days.   Prior to Admission medications   Medication Sig Start Date End Date Taking? Authorizing Provider  apixaban (ELIQUIS) 5 MG TABS tablet Take 5 mg by mouth every 12 (twelve) hours. 02/14/20  Yes Val Riles, MD  Ascorbic Acid (VITAMIN C) 1000 MG tablet Take 1,000 mg by mouth every evening.   Yes [provider]  Ashwagandha 500 MG CAPS Take 1,000 mg by mouth daily.   Yes [provider]  B Complex-C (SUPER B COMPLEX PO) Take 1 tablet by mouth daily.   Yes [provider]  Boswellia-Glucosamine-Vit D (OSTEO BI-FLEX ONE PER DAY) TABS Take 1 tablet by mouth daily.   Yes [provider]  Calcium-Magnesium-Vitamin D (CALCIUM MAGNESIUM PO) Take 1 tablet by mouth daily.   Yes [provider]  Cholecalciferol (VITAMIN D3) 50 MCG (2000 UT) TABS Take 2,000 Units by mouth daily.   Yes [provider]  Coenzyme Q10 (CO Q 10) 100 MG CAPS Take 100 mg by mouth daily.   Yes [provider]  cycloSPORINE (RESTASIS) 0.05 % ophthalmic emulsion Place 1 drop into both eyes 2 (two) times daily.   Yes [provider]  famotidine (PEPCID) 20 MG tablet Take 40 mg by  mouth at bedtime. 10/09/19  Yes [provider]  furosemide (LASIX) 40 MG tablet Take 1 tablet (40 mg total) by mouth 2 (two) times daily. Patient taking differently: Take 80 mg by mouth daily. 02/11/20  Yes Val Riles, MD  guaiFENesin-dextromethorphan (ROBITUSSIN DM) 100-10 MG/5ML syrup Take 5 mLs by mouth every 4 (four) hours as needed for cough. 02/25/21  Yes Dahal, Marlowe Aschoff, MD  levothyroxine (SYNTHROID) 150 MCG tablet Take 150 mcg by mouth daily before breakfast. 10/15/19  Yes [provider]  melatonin 5 MG TABS Take 5 mg by mouth at bedtime as needed (sleep).   Yes [provider]  Multiple Vitamins-Minerals (PRESERVISION AREDS 2 PO) Take 1 tablet by mouth daily.   Yes [provider]  potassium chloride (KLOR-CON) 10 MEQ tablet Take 20 mEq by mouth daily. 09/12/20  Yes [provider]  Red Yeast Rice  Extract 600 MG CAPS Take 600 mg by mouth every evening.   Yes [provider]  sacubitril-valsartan (ENTRESTO) 24-26 MG Take 1 tablet by mouth 2 (two) times daily. 03/03/21  Yes Darylene Price A, FNP  spironolactone (ALDACTONE) 25 MG tablet Take 1 tablet (25 mg total) by mouth daily. 03/03/21 06/01/21 Yes Christorpher Hisaw, Otila Kluver A, FNP  traZODone (DESYREL) 50 MG tablet Take 50-150 mg by mouth at bedtime as needed for sleep. 12/04/17  Yes [provider]  valACYclovir (VALTREX) 1000 MG tablet Take 2,000 mg by mouth daily as needed (For breakout). 11/23/20  Yes [provider]   Review of Systems  Constitutional:  Positive for fatigue. Negative for appetite change.  HENT:  Negative for congestion, postnasal drip and sore throat.   Eyes: Negative.   Respiratory:  Positive for cough ("little bit") and shortness of breath (minimal). Negative for chest tightness.   Cardiovascular:  Positive for leg swelling (minimal). Negative for chest pain and palpitations.  Gastrointestinal:  Positive for abdominal distention. Negative for abdominal pain.   Endocrine: Negative.   Musculoskeletal:  Positive for arthralgias (left shoulder) and myalgias (right thigh). Negative for back pain.  Skin: Negative.   Allergic/Immunologic: Negative.   Neurological:  Positive for dizziness. Negative for light-headedness.  Hematological:  Negative for adenopathy. Does not bruise/bleed easily.  Psychiatric/Behavioral:  Positive for sleep disturbance (interrupted sleep; sleeping on 1-2 pillows; wearing CPAP most nights). Negative for dysphoric mood. The patient is not nervous/anxious.    Vitals:   04/21/21 1012  BP: 140/80  Pulse: 76  Resp: 18  SpO2: 98%  Weight: 228 lb 8 oz (103.6 kg)  Height: '5\' 2"'$  (1.575 m)   Wt Readings from Last 3 Encounters:  04/21/21 228 lb 8 oz (103.6 kg)  03/03/21 229 lb (103.9 kg)  02/22/21 225 lb (102.1 kg)   Lab Results  Component Value Date   CREATININE 0.62 02/25/2021   CREATININE 0.54 02/24/2021   CREATININE 0.54 02/22/2021   Physical Exam Vitals and nursing note reviewed.  Constitutional:      Appearance: Normal appearance.  HENT:     Head: Normocephalic and atraumatic.  Cardiovascular:     Rate and Rhythm: Normal rate. Rhythm irregular.  Pulmonary:     Effort: Pulmonary effort is normal. No respiratory distress.     Breath sounds: No wheezing or rales.  Abdominal:     General: There is distension.     Palpations: Abdomen is soft.  Musculoskeletal:        General: No tenderness.     Cervical back: Normal range of motion and neck supple.     Right lower leg: No edema.     Left lower leg: No edema.  Skin:    General: Skin is warm and dry.  Neurological:     General: No focal deficit present.     Mental Status: She is alert and oriented to person, place, and time.  Psychiatric:        Mood and Affect: Mood normal.        Behavior: Behavior normal.        Thought Content: Thought content normal.   Assessment & Plan:  1: Chronic heart failure with reduced ejection fraction- - NYHA class II -  euvolemic today - weighing daily; reminded to call for an overnight weight gain of >2 pounds or a weekly weight gain of >5 pounds - weight stable from last visit here 6 weeks ago - not adding salt except to pasta  water - saw cardiology Nehemiah Massed) 03/11/21 - on GDMT of entresto & spironolactone - will add metoprolol succinate '25mg'$  daily; patient has pacemaker present - [employer] patient assistance forms filled out today and 1 month samples of entresto 24/'26mg'$  provided today - if she doesn't get approved for patient assistance, will probably have to change her to losartan  - saw pulmonology Raul Del) 12/24/20; returns 06/24/21 - referral made to pulmonary rehab for her HF; their # was also provided to her - BNP 02/22/21 was 108.8  2: HTN- - BP mildly elevated today (140/80) - saw PCP Quentin Cornwall) 03/29/21 - BMP 03/03/21 reviewed and showed sodium 143, potassium 4.0, creatinine 0.8 and GFR 78 - recheck BMP today as she's on spironolactone and potassium supplements  3: Atrial fibrillation- - previous ablation & cardioversion done  - on apixaban; no longer on amiodarone - saw EP Marcello Moores) 04/09/21   Patient did not bring her medications nor a list. Each medication was verbally reviewed with the patient and she was encouraged to bring the bottles to every visit to confirm accuracy of list.  Return in 3 months or sooner for any questions/problems before then.

## 2021-04-21 NOTE — Patient Instructions (Addendum)
Continue weighing daily and call for an overnight weight gain of > 2 pounds or a weekly weight gain of >5 pounds.    Begin metoprolol XL '25mg'$  daily.    Pulmonary rehab will call you.

## 2021-04-28 ENCOUNTER — Telehealth: Payer: Self-pay | Admitting: Family

## 2021-04-28 NOTE — Telephone Encounter (Signed)
Called and spoke with patient regarding [employer] patient assistance and to tell her she was approved and how to set up mail order delivery.    Mandy Jones, NT

## 2021-05-13 ENCOUNTER — Ambulatory Visit: Payer: Self-pay | Admitting: Urology

## 2021-05-13 ENCOUNTER — Other Ambulatory Visit: Payer: Self-pay | Admitting: *Deleted

## 2021-05-13 DIAGNOSIS — N2889 Other specified disorders of kidney and ureter: Secondary | ICD-10-CM

## 2021-06-01 ENCOUNTER — Other Ambulatory Visit: Payer: Self-pay

## 2021-06-01 ENCOUNTER — Ambulatory Visit
Admission: RE | Admit: 2021-06-01 | Discharge: 2021-06-01 | Disposition: A | Discharge status: Home / Self Care | Payer: Medicare HMO | Source: Ambulatory Visit | Attending: Urology | Admitting: Urology

## 2021-06-01 DIAGNOSIS — N2889 Other specified disorders of kidney and ureter: Secondary | ICD-10-CM | POA: Diagnosis present

## 2021-06-30 ENCOUNTER — Telehealth: Payer: Self-pay

## 2021-06-30 NOTE — Telephone Encounter (Signed)
Patient contacted to inform her that entresto medication enrollment will expire Dec. 31st and we need to have her fill out a new form for resubmission for 2023 this month, either during her appointment or anytime that is convenient for her to stop by. Patient acknowledged.  Georg Ruddle, RN [facility]

## 2021-07-05 ENCOUNTER — Encounter: Payer: Self-pay | Admitting: *Deleted

## 2021-07-05 ENCOUNTER — Other Ambulatory Visit: Payer: Self-pay

## 2021-07-05 ENCOUNTER — Encounter: Payer: Medicare HMO | Attending: Internal Medicine | Admitting: *Deleted

## 2021-07-05 DIAGNOSIS — I5022 Chronic systolic (congestive) heart failure: Secondary | ICD-10-CM

## 2021-07-05 DIAGNOSIS — I5020 Unspecified systolic (congestive) heart failure: Secondary | ICD-10-CM | POA: Insufficient documentation

## 2021-07-05 NOTE — Progress Notes (Signed)
Initial telephone orientation completed. Diagnosis can be found in [facility] 8/24. EP orientation scheduled for Thursday 11/10 at 9:30.

## 2021-07-08 ENCOUNTER — Other Ambulatory Visit: Payer: Self-pay

## 2021-07-08 VITALS — Ht 62.25 in | Wt 227.9 lb

## 2021-07-08 DIAGNOSIS — I5022 Chronic systolic (congestive) heart failure: Secondary | ICD-10-CM

## 2021-07-08 DIAGNOSIS — I5020 Unspecified systolic (congestive) heart failure: Secondary | ICD-10-CM | POA: Diagnosis not present

## 2021-07-08 NOTE — Patient Instructions (Signed)
Patient Instructions  Patient Details  Name: Mandy Jones MRN: [id number] Date of Birth: 04-23-1951 Referring Provider:  Corey Skains, MD  Below are your personal goals for exercise, nutrition, and risk factors. Our goal is to help you stay on track towards obtaining and maintaining these goals. We will be discussing your progress on these goals with you throughout the program.  Initial Exercise Prescription:  Initial Exercise Prescription - 07/08/21 1300       Date of Initial Exercise RX and Referring Provider   Date 07/08/21    Referring Provider Serafina Royals MD      Oxygen   Maintain Oxygen Saturation 88% or higher      Recumbant Bike   Level 1    RPM 60    Watts 15    Minutes 15    METs 1.1      NuStep   Level 1    SPM 80    Minutes 15    METs 1.1      Track   Laps 16    Minutes 15    METs 1.87      Prescription Details   Frequency (times per week) 2    Duration Progress to 30 minutes of continuous aerobic without signs/symptoms of physical distress      Intensity   THRR 40-80% of Max Heartrate 106-134    Ratings of Perceived Exertion 11-13    Perceived Dyspnea 0-4      Progression   Progression Continue to progress workloads to maintain intensity without signs/symptoms of physical distress.      Resistance Training   Training Prescription Yes    Weight 3 lb    Reps 10-15             Exercise Goals: Frequency: Be able to perform aerobic exercise two to three times per week in program working toward 2-5 days per week of home exercise.  Intensity: Work with a perceived exertion of 11 (fairly light) - 15 (hard) while following your exercise prescription.  We will make changes to your prescription with you as you progress through the program.   Duration: Be able to do 30 to 45 minutes of continuous aerobic exercise in addition to a 5 minute warm-up and a 5 minute cool-down routine.   Nutrition Goals: Your personal nutrition goals  will be established when you do your nutrition analysis with the dietician.  The following are general nutrition guidelines to follow: Cholesterol < 200mg /day Sodium < 1500mg /day Fiber: Women over 50 yrs - 21 grams per day  Personal Goals:  Personal Goals and Risk Factors at Admission - 07/08/21 1358       Core Components/Risk Factors/Patient Goals on Admission    Weight Management Yes;Weight Loss    Intervention Weight Management: Develop a combined nutrition and exercise program designed to reach desired caloric intake, while maintaining appropriate intake of nutrient and fiber, sodium and fats, and appropriate energy expenditure required for the weight goal.;Weight Management: Provide education and appropriate resources to help participant work on and attain dietary goals.;Obesity: Provide education and appropriate resources to help participant work on and attain dietary goals.    Admit Weight 227 lb (103 kg)    Goal Weight: Short Term 222 lb (100.7 kg)    Goal Weight: Long Term 150 lb (68 kg)    Expected Outcomes Short Term: Continue to assess and modify interventions until short term weight is achieved;Long Term: Adherence to nutrition and physical activity/exercise program aimed  toward attainment of established weight goal;Weight Loss: Understanding of general recommendations for a balanced deficit meal plan, which promotes 1-2 lb weight loss per week and includes a negative energy balance of [phone] kcal/d;Understanding recommendations for meals to include 15-35% energy as protein, 25-35% energy from fat, 35-60% energy from carbohydrates, less than 200mg  of dietary cholesterol, 20-35 gm of total fiber daily;Understanding of distribution of calorie intake throughout the day with the consumption of 4-5 meals/snacks    Heart Failure Yes    Intervention Provide a combined exercise and nutrition program that is supplemented with education, support and counseling about heart failure. Directed  toward relieving symptoms such as shortness of breath, decreased exercise tolerance, and extremity edema.    Expected Outcomes Short term: Attendance in program 2-3 days a week with increased exercise capacity. Reported lower sodium intake. Reported increased fruit and vegetable intake. Reports medication compliance.;Improve functional capacity of life;Short term: Daily weights obtained and reported for increase. Utilizing diuretic protocols set by physician.;Long term: Adoption of self-care skills and reduction of barriers for early signs and symptoms recognition and intervention leading to self-care maintenance.    Hypertension Yes    Intervention Provide education on lifestyle modifcations including regular physical activity/exercise, weight management, moderate sodium restriction and increased consumption of fresh fruit, vegetables, and low fat dairy, alcohol moderation, and smoking cessation.;Monitor prescription use compliance.    Expected Outcomes Short Term: Continued assessment and intervention until BP is < 140/55mm HG in hypertensive participants. < 130/48mm HG in hypertensive participants with diabetes, heart failure or chronic kidney disease.;Long Term: Maintenance of blood pressure at goal levels.    Lipids Yes    Intervention Provide education and support for participant on nutrition & aerobic/resistive exercise along with prescribed medications to achieve LDL 70mg , HDL >40mg .    Expected Outcomes Short Term: Participant states understanding of desired cholesterol values and is compliant with medications prescribed. Participant is following exercise prescription and nutrition guidelines.;Long Term: Cholesterol controlled with medications as prescribed, with individualized exercise RX and with personalized nutrition plan. Value goals: LDL < 70mg , HDL > 40 mg.             Tobacco Use Initial Evaluation: Social History   Tobacco Use  Smoking Status Former  Smokeless Tobacco Never   Tobacco Comments   quit 30 years ago    Exercise Goals and Review:  Exercise Goals     Row Name 07/08/21 1358             Exercise Goals   Increase Physical Activity Yes       Intervention Provide advice, education, support and counseling about physical activity/exercise needs.;Develop an individualized exercise prescription for aerobic and resistive training based on initial evaluation findings, risk stratification, comorbidities and participant's personal goals.       Expected Outcomes Short Term: Attend rehab on a regular basis to increase amount of physical activity.;Long Term: Add in home exercise to make exercise part of routine and to increase amount of physical activity.;Long Term: Exercising regularly at least 3-5 days a week.       Increase Strength and Stamina Yes       Intervention Provide advice, education, support and counseling about physical activity/exercise needs.;Develop an individualized exercise prescription for aerobic and resistive training based on initial evaluation findings, risk stratification, comorbidities and participant's personal goals.       Expected Outcomes Short Term: Increase workloads from initial exercise prescription for resistance, speed, and METs.;Short Term: Perform resistance training exercises routinely during  rehab and add in resistance training at home;Long Term: Improve cardiorespiratory fitness, muscular endurance and strength as measured by increased METs and functional capacity (6MWT)       Able to understand and use rate of perceived exertion (RPE) scale Yes       Intervention Provide education and explanation on how to use RPE scale       Expected Outcomes Short Term: Able to use RPE daily in rehab to express subjective intensity level;Long Term:  Able to use RPE to guide intensity level when exercising independently       Able to understand and use Dyspnea scale Yes       Intervention Provide education and explanation on how to use  Dyspnea scale       Expected Outcomes Short Term: Able to use Dyspnea scale daily in rehab to express subjective sense of shortness of breath during exertion;Long Term: Able to use Dyspnea scale to guide intensity level when exercising independently       Knowledge and understanding of Target Heart Rate Range (THRR) Yes       Intervention Provide education and explanation of THRR including how the numbers were predicted and where they are located for reference       Expected Outcomes Short Term: Able to state/look up THRR;Long Term: Able to use THRR to govern intensity when exercising independently;Short Term: Able to use daily as guideline for intensity in rehab       Able to check pulse independently Yes       Intervention Provide education and demonstration on how to check pulse in carotid and radial arteries.;Review the importance of being able to check your own pulse for safety during independent exercise       Expected Outcomes Short Term: Able to explain why pulse checking is important during independent exercise;Long Term: Able to check pulse independently and accurately       Understanding of Exercise Prescription Yes       Intervention Provide education, explanation, and written materials on patient's individual exercise prescription       Expected Outcomes Short Term: Able to explain program exercise prescription;Long Term: Able to explain home exercise prescription to exercise independently                Copy of goals given to participant.

## 2021-07-08 NOTE — Progress Notes (Signed)
Pulmonary Individual Treatment Plan  Patient Details  Name: Mandy Jones MRN: [id number] Date of Birth: [birth year] Referring Provider:   Flowsheet Row Pulmonary Rehab from 07/08/2021 in [facility] Cardiac and Pulmonary Rehab  Referring Provider Serafina Royals MD       Initial Encounter Date:  Flowsheet Row Pulmonary Rehab from 07/08/2021 in [facility] Cardiac and Pulmonary Rehab  Date 07/08/21       Visit Diagnosis: Heart failure, chronic systolic ([place])  Patient's Home Medications on Admission:  Current Outpatient Medications:    apixaban (ELIQUIS) 5 MG TABS tablet, Take 5 mg by mouth every 12 (twelve) hours., Disp: 60 tablet, Rfl: 0   Ascorbic Acid (VITAMIN C) 1000 MG tablet, Take 1,000 mg by mouth every evening., Disp: , Rfl:    Ashwagandha 500 MG CAPS, Take 1,000 mg by mouth daily., Disp: , Rfl:    B Complex-C (SUPER B COMPLEX PO), Take 1 tablet by mouth daily., Disp: , Rfl:    Boswellia-Glucosamine-Vit D (OSTEO BI-FLEX ONE PER DAY) TABS, Take 1 tablet by mouth daily., Disp: , Rfl:    Calcium-Magnesium-Vitamin D (CALCIUM MAGNESIUM PO), Take 1 tablet by mouth daily., Disp: , Rfl:    Cholecalciferol (VITAMIN D3) 50 MCG (2000 UT) TABS, Take 2,000 Units by mouth daily., Disp: , Rfl:    Coenzyme Q10 (CO Q 10) 100 MG CAPS, Take 100 mg by mouth daily., Disp: , Rfl:    cycloSPORINE (RESTASIS) 0.05 % ophthalmic emulsion, Place 1 drop into both eyes 2 (two) times daily., Disp: , Rfl:    famotidine (PEPCID) 20 MG tablet, Take 40 mg by mouth at bedtime., Disp: , Rfl:    furosemide (LASIX) 40 MG tablet, Take 1 tablet (40 mg total) by mouth 2 (two) times daily. (Patient taking differently: Take 40 mg by mouth daily.), Disp: 60 tablet, Rfl: 0   guaiFENesin-dextromethorphan (ROBITUSSIN DM) 100-10 MG/5ML syrup, Take 5 mLs by mouth every 4 (four) hours as needed for cough. (Patient not taking: Reported on 07/05/2021), Disp: 118 mL, Rfl: 0   levothyroxine (SYNTHROID) 150 MCG tablet, Take 150 mcg by  mouth daily before breakfast., Disp: , Rfl:    melatonin 5 MG TABS, Take 5 mg by mouth at bedtime as needed (sleep)., Disp: , Rfl:    metoprolol succinate (TOPROL XL) 25 MG 24 hr tablet, Take 1 tablet (25 mg total) by mouth daily., Disp: 30 tablet, Rfl: 5   Multiple Vitamins-Minerals (PRESERVISION AREDS 2 PO), Take 1 tablet by mouth daily., Disp: , Rfl:    potassium chloride (KLOR-CON) 10 MEQ tablet, Take 20 mEq by mouth daily., Disp: , Rfl:    Red Yeast Rice Extract 600 MG CAPS, Take 600 mg by mouth every evening., Disp: , Rfl:    sacubitril-valsartan (ENTRESTO) 24-26 MG, Take 1 tablet by mouth 2 (two) times daily., Disp: 60 tablet, Rfl: 5   spironolactone (ALDACTONE) 25 MG tablet, Take 1 tablet (25 mg total) by mouth daily., Disp: 30 tablet, Rfl: 5   traZODone (DESYREL) 50 MG tablet, Take 50-150 mg by mouth at bedtime as needed for sleep., Disp: , Rfl: 1   valACYclovir (VALTREX) 1000 MG tablet, Take 2,000 mg by mouth daily as needed (For breakout)., Disp: , Rfl:   Past Medical History: Past Medical History:  Diagnosis Date   Arrhythmia    atrial fibrillation   Arthritis    CHF (congestive heart failure) ([place])    Depression    Dyspnea    Dysrhythmia    Fibromyalgia 1992  GERD (gastroesophageal reflux disease)    Hepatitis    Hepatitis B 1985   Hyperlipidemia    Hypertension    Mitral valve prolapse    Pacemaker    Pacemaker 01/13/2021   Sleep apnea    CPAP   Tendon tear, ankle    rt foot   Thyroid dysfunction     Tobacco Use: Social History   Tobacco Use  Smoking Status Former  Smokeless Tobacco Never  Tobacco Comments   quit 30 years ago    Labs: Recent Review Flowsheet Data   There is no flowsheet data to display.      Pulmonary Assessment Scores:  Pulmonary Assessment Scores     Row Name 07/08/21 1125         ADL UCSD   ADL Phase Entry     SOB Score total 63     Rest 4     Walk 5     Stairs 5     Bath 1     Dress 1     Shop 2       CAT Score    CAT Score 17       mMRC Score   mMRC Score 3              UCSD: Self-administered rating of dyspnea associated with activities of daily living (ADLs) 6-point scale (0 = "not at all" to 5 = "maximal or unable to do because of breathlessness")  Scoring Scores range from 0 to 120.  Minimally important difference is 5 units  CAT: CAT can identify the health impairment of COPD patients and is better correlated with disease progression.  CAT has a scoring range of zero to 40. The CAT score is classified into four groups of low (less than 10), medium (10 - 20), high (21-30) and very high (31-40) based on the impact level of disease on health status. A CAT score over 10 suggests significant symptoms.  A worsening CAT score could be explained by an exacerbation, poor medication adherence, poor inhaler technique, or progression of COPD or comorbid conditions.  CAT MCID is 2 points  mMRC: mMRC (Modified Medical Research Council) Dyspnea Scale is used to assess the degree of baseline functional disability in patients of respiratory disease due to dyspnea. No minimal important difference is established. A decrease in score of 1 point or greater is considered a positive change.   Pulmonary Function Assessment:   Exercise Target Goals: Exercise Program Goal: Individual exercise prescription set using results from initial 6 min walk test and THRR while considering  patient's activity barriers and safety.   Exercise Prescription Goal: Initial exercise prescription builds to 30-45 minutes a day of aerobic activity, 2-3 days per week.  Home exercise guidelines will be given to patient during program as part of exercise prescription that the participant will acknowledge.  Education: Aerobic Exercise: - Group verbal and visual presentation on the components of exercise prescription. Introduces F.I.T.T principle from ACSM for exercise prescriptions.  Reviews F.I.T.T. principles of aerobic exercise  including progression. Written material given at graduation. Flowsheet Row Pulmonary Rehab from 07/08/2021 in [facility] Cardiac and Pulmonary Rehab  Education need identified 07/08/21       Education: Resistance Exercise: - Group verbal and visual presentation on the components of exercise prescription. Introduces F.I.T.T principle from ACSM for exercise prescriptions  Reviews F.I.T.T. principles of resistance exercise including progression. Written material given at graduation.    Education: Exercise & Equipment Safety: - Individual  verbal instruction and demonstration of equipment use and safety with use of the equipment. Flowsheet Row Pulmonary Rehab from 07/08/2021 in [facility] Cardiac and Pulmonary Rehab  Education need identified 07/08/21  Date 07/08/21  Educator [place]  Instruction Review Code 1- Verbalizes Understanding       Education: Exercise Physiology & General Exercise Guidelines: - Group verbal and written instruction with models to review the exercise physiology of the cardiovascular system and associated critical values. Provides general exercise guidelines with specific guidelines to those with heart or lung disease.    Education: Flexibility, Balance, Mind/Body Relaxation: - Group verbal and visual presentation with interactive activity on the components of exercise prescription. Introduces F.I.T.T principle from ACSM for exercise prescriptions. Reviews F.I.T.T. principles of flexibility and balance exercise training including progression. Also discusses the mind body connection.  Reviews various relaxation techniques to help reduce and manage stress (i.e. Deep breathing, progressive muscle relaxation, and visualization). Balance handout provided to take home. Written material given at graduation.   Activity Barriers & Risk Stratification:  Activity Barriers & Cardiac Risk Stratification - 07/08/21 1121       Activity Barriers & Cardiac Risk Stratification   Activity Barriers  Left Knee Replacement;Right Knee Replacement;Balance Concerns;Arthritis;Shortness of Breath;Assistive Device;Deconditioning;Muscular Weakness;Other (comment)    Comments Bursitis in left shoulder             6 Minute Walk:  6 Minute Walk     Row Name 07/08/21 1130         6 Minute Walk   Phase Initial     Distance 875 feet     Walk Time 6 minutes     # of Rest Breaks 0     MPH 1.65     METS 1.16     RPE 12     Perceived Dyspnea  2     VO2 Peak 4.07     Symptoms Yes (comment)     Comments Felt unsteady on feet     Resting HR 79 bpm     Resting BP 104/64     Resting Oxygen Saturation  97 %     Exercise Oxygen Saturation  during 6 min walk 96 %     Max Ex. HR 105 bpm     Max Ex. BP 112/66     2 Minute Post BP 102/66       Interval HR   1 Minute HR 99     2 Minute HR 102     3 Minute HR 105     4 Minute HR 102     5 Minute HR 103     6 Minute HR 101     2 Minute Post HR 87     Interval Heart Rate? Yes       Interval Oxygen   Interval Oxygen? Yes     Baseline Oxygen Saturation % 97 %     1 Minute Oxygen Saturation % 96 %     1 Minute Liters of Oxygen 0 L  RA     2 Minute Oxygen Saturation % 97 %     2 Minute Liters of Oxygen 0 L     3 Minute Oxygen Saturation % 96 %     3 Minute Liters of Oxygen 0 L     4 Minute Oxygen Saturation % 96 %     4 Minute Liters of Oxygen 0 L     5 Minute Oxygen Saturation % 97 %  5 Minute Liters of Oxygen 0 L     6 Minute Oxygen Saturation % 96 %     6 Minute Liters of Oxygen 0 L     2 Minute Post Oxygen Saturation % 98 %     2 Minute Post Liters of Oxygen 0 L             Oxygen Initial Assessment:  Oxygen Initial Assessment - 07/08/21 1124       Home Oxygen   Home Oxygen Device None    Sleep Oxygen Prescription CPAP    Home Exercise Oxygen Prescription None    Home Resting Oxygen Prescription None    Compliance with Home Oxygen Use Yes      Initial 6 min Walk   Oxygen Used None      Program Oxygen  Prescription   Program Oxygen Prescription None      Intervention   Short Term Goals To learn and exhibit compliance with exercise, home and travel O2 prescription;To learn and understand importance of monitoring SPO2 with pulse oximeter and demonstrate accurate use of the pulse oximeter.;To learn and understand importance of maintaining oxygen saturations>88%;To learn and demonstrate proper pursed lip breathing techniques or other breathing techniques. ;To learn and demonstrate proper use of respiratory medications    Long  Term Goals Exhibits compliance with exercise, home  and travel O2 prescription;Maintenance of O2 saturations>88%;Exhibits proper breathing techniques, such as pursed lip breathing or other method taught during program session;Verbalizes importance of monitoring SPO2 with pulse oximeter and return demonstration;Compliance with respiratory medication             Oxygen Re-Evaluation:   Oxygen Discharge (Final Oxygen Re-Evaluation):   Initial Exercise Prescription:  Initial Exercise Prescription - 07/08/21 1300       Date of Initial Exercise RX and Referring Provider   Date 07/08/21    Referring Provider Serafina Royals MD      Oxygen   Maintain Oxygen Saturation 88% or higher      Recumbant Bike   Level 1    RPM 60    Watts 15    Minutes 15    METs 1.1      NuStep   Level 1    SPM 80    Minutes 15    METs 1.1      Track   Laps 16    Minutes 15    METs 1.87      Prescription Details   Frequency (times per week) 2    Duration Progress to 30 minutes of continuous aerobic without signs/symptoms of physical distress      Intensity   THRR 40-80% of Max Heartrate 106-134    Ratings of Perceived Exertion 11-13    Perceived Dyspnea 0-4      Progression   Progression Continue to progress workloads to maintain intensity without signs/symptoms of physical distress.      Resistance Training   Training Prescription Yes    Weight 3 lb    Reps 10-15              Perform Capillary Blood Glucose checks as needed.  Exercise Prescription Changes:   Exercise Prescription Changes     Row Name 07/08/21 1300             Response to Exercise   Blood Pressure (Admit) 104/64       Blood Pressure (Exercise) 112/66       Blood Pressure (Exit) 102/66  Heart Rate (Admit) 79 bpm       Heart Rate (Exercise) 105 bpm       Heart Rate (Exit) 85 bpm       Oxygen Saturation (Admit) 97 %       Oxygen Saturation (Exercise) 96 %       Oxygen Saturation (Exit) 98 %       Rating of Perceived Exertion (Exercise) 12       Perceived Dyspnea (Exercise) 2       Symptoms Unsteady on feet       Comments walk test results                Exercise Comments:   Exercise Goals and Review:   Exercise Goals     Row Name 07/08/21 1358             Exercise Goals   Increase Physical Activity Yes       Intervention Provide advice, education, support and counseling about physical activity/exercise needs.;Develop an individualized exercise prescription for aerobic and resistive training based on initial evaluation findings, risk stratification, comorbidities and participant's personal goals.       Expected Outcomes Short Term: Attend rehab on a regular basis to increase amount of physical activity.;Long Term: Add in home exercise to make exercise part of routine and to increase amount of physical activity.;Long Term: Exercising regularly at least 3-5 days a week.       Increase Strength and Stamina Yes       Intervention Provide advice, education, support and counseling about physical activity/exercise needs.;Develop an individualized exercise prescription for aerobic and resistive training based on initial evaluation findings, risk stratification, comorbidities and participant's personal goals.       Expected Outcomes Short Term: Increase workloads from initial exercise prescription for resistance, speed, and METs.;Short Term: Perform resistance  training exercises routinely during rehab and add in resistance training at home;Long Term: Improve cardiorespiratory fitness, muscular endurance and strength as measured by increased METs and functional capacity (6MWT)       Able to understand and use rate of perceived exertion (RPE) scale Yes       Intervention Provide education and explanation on how to use RPE scale       Expected Outcomes Short Term: Able to use RPE daily in rehab to express subjective intensity level;Long Term:  Able to use RPE to guide intensity level when exercising independently       Able to understand and use Dyspnea scale Yes       Intervention Provide education and explanation on how to use Dyspnea scale       Expected Outcomes Short Term: Able to use Dyspnea scale daily in rehab to express subjective sense of shortness of breath during exertion;Long Term: Able to use Dyspnea scale to guide intensity level when exercising independently       Knowledge and understanding of Target Heart Rate Range (THRR) Yes       Intervention Provide education and explanation of THRR including how the numbers were predicted and where they are located for reference       Expected Outcomes Short Term: Able to state/look up THRR;Long Term: Able to use THRR to govern intensity when exercising independently;Short Term: Able to use daily as guideline for intensity in rehab       Able to check pulse independently Yes       Intervention Provide education and demonstration on how to check pulse in carotid and radial  arteries.;Review the importance of being able to check your own pulse for safety during independent exercise       Expected Outcomes Short Term: Able to explain why pulse checking is important during independent exercise;Long Term: Able to check pulse independently and accurately       Understanding of Exercise Prescription Yes       Intervention Provide education, explanation, and written materials on patient's individual exercise  prescription       Expected Outcomes Short Term: Able to explain program exercise prescription;Long Term: Able to explain home exercise prescription to exercise independently                Exercise Goals Re-Evaluation :   Discharge Exercise Prescription (Final Exercise Prescription Changes):  Exercise Prescription Changes - 07/08/21 1300       Response to Exercise   Blood Pressure (Admit) 104/64    Blood Pressure (Exercise) 112/66    Blood Pressure (Exit) 102/66    Heart Rate (Admit) 79 bpm    Heart Rate (Exercise) 105 bpm    Heart Rate (Exit) 85 bpm    Oxygen Saturation (Admit) 97 %    Oxygen Saturation (Exercise) 96 %    Oxygen Saturation (Exit) 98 %    Rating of Perceived Exertion (Exercise) 12    Perceived Dyspnea (Exercise) 2    Symptoms Unsteady on feet    Comments walk test results             Nutrition:  Target Goals: Understanding of nutrition guidelines, daily intake of sodium 1500mg , cholesterol 200mg , calories 30% from fat and 7% or less from saturated fats, daily to have 5 or more servings of fruits and vegetables.  Education: All About Nutrition: -Group instruction provided by verbal, written material, interactive activities, discussions, models, and posters to present general guidelines for heart healthy nutrition including fat, fiber, MyPlate, the role of sodium in heart healthy nutrition, utilization of the nutrition label, and utilization of this knowledge for meal planning. Follow up email sent as well. Written material given at graduation. Flowsheet Row Pulmonary Rehab from 07/08/2021 in [facility] Cardiac and Pulmonary Rehab  Education need identified 07/08/21       Biometrics:  Pre Biometrics - 07/08/21 1122       Pre Biometrics   Height 5' 2.25" (1.581 m)    Weight 227 lb 14.4 oz (103.4 kg)    BMI (Calculated) 41.36    Single Leg Stand 8.94 seconds              Nutrition Therapy Plan and Nutrition Goals:   Nutrition  Assessments:  MEDIFICTS Score Key: ?70 Need to make dietary changes  40-70 Heart Healthy Diet ? 40 Therapeutic Level Cholesterol Diet  Flowsheet Row Pulmonary Rehab from 07/08/2021 in [facility] Cardiac and Pulmonary Rehab  Picture Your Plate Total Score on Admission 65      Picture Your Plate Scores: <42 Unhealthy dietary pattern with much room for improvement. 41-50 Dietary pattern unlikely to meet recommendations for good health and room for improvement. 51-60 More healthful dietary pattern, with some room for improvement.  >60 Healthy dietary pattern, although there may be some specific behaviors that could be improved.   Nutrition Goals Re-Evaluation:   Nutrition Goals Discharge (Final Nutrition Goals Re-Evaluation):   Psychosocial: Target Goals: Acknowledge presence or absence of significant depression and/or stress, maximize coping skills, provide positive support system. Participant is able to verbalize types and ability to use techniques and skills needed for reducing stress  and depression.   Education: Stress, Anxiety, and Depression - Group verbal and visual presentation to define topics covered.  Reviews how body is impacted by stress, anxiety, and depression.  Also discusses healthy ways to reduce stress and to treat/manage anxiety and depression.  Written material given at graduation.   Education: Sleep Hygiene -Provides group verbal and written instruction about how sleep can affect your health.  Define sleep hygiene, discuss sleep cycles and impact of sleep habits. Review good sleep hygiene tips.    Initial Review & Psychosocial Screening:  Initial Psych Review & Screening - 07/05/21 1413       Initial Review   Current issues with Current Sleep Concerns;Current Stress Concerns    Source of Stress Concerns Chronic Illness;Unable to participate in former interests or hobbies;Unable to perform yard/household activities      [place]? No     Concerns No support system      Barriers   Psychosocial barriers to participate in program There are no identifiable barriers or psychosocial needs.      Screening Interventions   Interventions Encouraged to exercise;Provide feedback about the scores to participant;To provide support and resources with identified psychosocial needs    Expected Outcomes Short Term goal: Utilizing psychosocial counselor, staff and physician to assist with identification of specific Stressors or current issues interfering with healing process. Setting desired goal for each stressor or current issue identified.;Long Term Goal: Stressors or current issues are controlled or eliminated.;Short Term goal: Identification and review with participant of any Quality of Life or Depression concerns found by scoring the questionnaire.;Long Term goal: The participant improves quality of Life and PHQ9 Scores as seen by post scores and/or verbalization of changes             Quality of Life Scores:  Scores of 19 and below usually indicate a poorer quality of life in these areas.  A difference of  2-3 points is a clinically meaningful difference.  A difference of 2-3 points in the total score of the Quality of Life Index has been associated with significant improvement in overall quality of life, self-image, physical symptoms, and general health in studies assessing change in quality of life.  PHQ-9: Recent Review Flowsheet Data     Depression screen [facility] 2/9 07/08/2021 03/03/2021   Decreased Interest 1 0   Down, Depressed, Hopeless 2 1   PHQ - 2 Score 3 1   Altered sleeping 3 -   Tired, decreased energy 3 -   Change in appetite 2 -   Feeling bad or failure about yourself  1 -   Trouble concentrating 1 -   Moving slowly or fidgety/restless 1 -   Suicidal thoughts 0 -   PHQ-9 Score 14 -   Difficult doing work/chores Somewhat difficult -      Interpretation of Total Score  Total Score Depression Severity:  1-4 =  Minimal depression, 5-9 = Mild depression, 10-14 = Moderate depression, 15-19 = Moderately severe depression, 20-27 = Severe depression   Psychosocial Evaluation and Intervention:  Psychosocial Evaluation - 07/05/21 1424       Psychosocial Evaluation & Interventions   Interventions Stress management education;Encouraged to exercise with the program and follow exercise prescription    Comments Gwenn reports her heart failure symptoms have been getting in the way of her daily life for the last 5 years. She feels like she can't participate in her hobbies or take her dogs places like she  used to. Her mother passed away in the last two years and her only brother that is close by doesn't speak to her. [place] with [place] dogs, so she states she doesn't have a real support system. She was exercising regularly trying to lose weight but her heart failure symptoms have gotten in the way and now she is up to a weight she has never been before and is hating it. She wants to feel better and increase her stamina as well as lose weight. She does have some vestibular issues post the second Covid vaccine that also causes some balance issues.She takes medication to sleep but also takes a diuretic so she feels like she is constantly in the bathroom. She does have a copay so she may not be able to complete the total number of sessions, but she is very motivated to do whatever it takes to gain some control back in her life.    Expected Outcomes Short; attend Pulmonary Rehab for exercise and education. Long; develop and maintain positive self care habits.    Continue Psychosocial Services  Follow up required by staff             Psychosocial Re-Evaluation:   Psychosocial Discharge (Final Psychosocial Re-Evaluation):   Education: Education Goals: Education classes will be provided on a weekly basis, covering required topics. Participant will state understanding/return  demonstration of topics presented.  Learning Barriers/Preferences:  Learning Barriers/Preferences - 07/05/21 1413       Learning Barriers/Preferences   Learning Barriers None    Learning Preferences None             General Pulmonary Education Topics:  Infection Prevention: - Provides verbal and written material to individual with discussion of infection control including proper hand washing and proper equipment cleaning during exercise session. Flowsheet Row Pulmonary Rehab from 07/08/2021 in [facility] Cardiac and Pulmonary Rehab  Education need identified 07/08/21  Date 07/08/21  Educator [place]  Instruction Review Code 1- Verbalizes Understanding       Falls Prevention: - Provides verbal and written material to individual with discussion of falls prevention and safety. Flowsheet Row Pulmonary Rehab from 07/08/2021 in [facility] Cardiac and Pulmonary Rehab  Education need identified 07/08/21  Date 07/08/21  Educator [facility]  Instruction Review Code 1- Verbalizes Understanding       Chronic Lung Disease Review: - Group verbal instruction with posters, models, PowerPoint presentations and videos,  to review new updates, new respiratory medications, new advancements in procedures and treatments. Providing information on websites and "800" numbers for continued self-education. Includes information about supplement oxygen, available portable oxygen systems, continuous and intermittent flow rates, oxygen safety, concentrators, and Medicare reimbursement for oxygen. Explanation of Pulmonary Drugs, including class, frequency, complications, importance of spacers, rinsing mouth after steroid MDI's, and proper cleaning methods for nebulizers. Review of basic lung anatomy and physiology related to function, structure, and complications of lung disease. Review of risk factors. Discussion about methods for diagnosing sleep apnea and types of masks and machines for OSA. Includes a review of the use of types  of environmental controls: home humidity, furnaces, filters, dust mite/pet prevention, HEPA vacuums. Discussion about weather changes, air quality and the benefits of nasal washing. Instruction on Warning signs, infection symptoms, calling MD promptly, preventive modes, and value of vaccinations. Review of effective airway clearance, coughing and/or vibration techniques. Emphasizing that all should Create an Action Plan. Written material given at graduation.   AED/CPR: - Group verbal  and written instruction with the use of models to demonstrate the basic use of the AED with the basic ABC's of resuscitation.    Anatomy and Cardiac Procedures: - Group verbal and visual presentation and models provide information about basic cardiac anatomy and function. Reviews the testing methods done to diagnose heart disease and the outcomes of the test results. Describes the treatment choices: Medical Management, Angioplasty, or Coronary Bypass Surgery for treating various heart conditions including Myocardial Infarction, Angina, Valve Disease, and Cardiac Arrhythmias.  Written material given at graduation.   Medication Safety: - Group verbal and visual instruction to review commonly prescribed medications for heart and lung disease. Reviews the medication, class of the drug, and side effects. Includes the steps to properly store meds and maintain the prescription regimen.  Written material given at graduation.   Other: -Provides group and verbal instruction on various topics (see comments)   Knowledge Questionnaire Score:  Knowledge Questionnaire Score - 07/08/21 1120       Knowledge Questionnaire Score   Pre Score 23/26 (cardiac): Diabetes, Nutrition, Aerobic              Core Components/Risk Factors/Patient Goals at Admission:  Personal Goals and Risk Factors at Admission - 07/08/21 1358       Core Components/Risk Factors/Patient Goals on Admission    Weight Management Yes;Weight Loss     Intervention Weight Management: Develop a combined nutrition and exercise program designed to reach desired caloric intake, while maintaining appropriate intake of nutrient and fiber, sodium and fats, and appropriate energy expenditure required for the weight goal.;Weight Management: Provide education and appropriate resources to help participant work on and attain dietary goals.;Obesity: Provide education and appropriate resources to help participant work on and attain dietary goals.    Admit Weight 227 lb (103 kg)    Goal Weight: Short Term 222 lb (100.7 kg)    Goal Weight: Long Term 150 lb (68 kg)    Expected Outcomes Short Term: Continue to assess and modify interventions until short term weight is achieved;Long Term: Adherence to nutrition and physical activity/exercise program aimed toward attainment of established weight goal;Weight Loss: Understanding of general recommendations for a balanced deficit meal plan, which promotes 1-2 lb weight loss per week and includes a negative energy balance of [phone] kcal/d;Understanding recommendations for meals to include 15-35% energy as protein, 25-35% energy from fat, 35-60% energy from carbohydrates, less than 200mg  of dietary cholesterol, 20-35 gm of total fiber daily;Understanding of distribution of calorie intake throughout the day with the consumption of 4-5 meals/snacks    Heart Failure Yes    Intervention Provide a combined exercise and nutrition program that is supplemented with education, support and counseling about heart failure. Directed toward relieving symptoms such as shortness of breath, decreased exercise tolerance, and extremity edema.    Expected Outcomes Short term: Attendance in program 2-3 days a week with increased exercise capacity. Reported lower sodium intake. Reported increased fruit and vegetable intake. Reports medication compliance.;Improve functional capacity of life;Short term: Daily weights obtained and reported for increase.  Utilizing diuretic protocols set by physician.;Long term: Adoption of self-care skills and reduction of barriers for early signs and symptoms recognition and intervention leading to self-care maintenance.    Hypertension Yes    Intervention Provide education on lifestyle modifcations including regular physical activity/exercise, weight management, moderate sodium restriction and increased consumption of fresh fruit, vegetables, and low fat dairy, alcohol moderation, and smoking cessation.;Monitor prescription use compliance.    Expected Outcomes Short Term:  Continued assessment and intervention until BP is < 140/48mm HG in hypertensive participants. < 130/33mm HG in hypertensive participants with diabetes, heart failure or chronic kidney disease.;Long Term: Maintenance of blood pressure at goal levels.    Lipids Yes    Intervention Provide education and support for participant on nutrition & aerobic/resistive exercise along with prescribed medications to achieve LDL 70mg , HDL >40mg .    Expected Outcomes Short Term: Participant states understanding of desired cholesterol values and is compliant with medications prescribed. Participant is following exercise prescription and nutrition guidelines.;Long Term: Cholesterol controlled with medications as prescribed, with individualized exercise RX and with personalized nutrition plan. Value goals: LDL < 70mg , HDL > 40 mg.             Education:Diabetes - Individual verbal and written instruction to review signs/symptoms of diabetes, desired ranges of glucose level fasting, after meals and with exercise. Acknowledge that pre and post exercise glucose checks will be done for 3 sessions at entry of program.   Know Your Numbers and Heart Failure: - Group verbal and visual instruction to discuss disease risk factors for cardiac and pulmonary disease and treatment options.  Reviews associated critical values for Overweight/Obesity, Hypertension, Cholesterol,  and Diabetes.  Discusses basics of heart failure: signs/symptoms and treatments.  Introduces Heart Failure Zone chart for action plan for heart failure.  Written material given at graduation. Flowsheet Row Pulmonary Rehab from 07/08/2021 in [facility] Cardiac and Pulmonary Rehab  Education need identified 07/08/21       Core Components/Risk Factors/Patient Goals Review:    Core Components/Risk Factors/Patient Goals at Discharge (Final Review):    ITP Comments:  ITP Comments     Row Name 07/05/21 1434 07/08/21 1121         ITP Comments Initial telephone orientation completed. Diagnosis can be found in [facility] 8/24. EP orientation scheduled for Thursday 11/10 at 9:30. Completed 6MWT and gym orientation. Initial ITP created and sent for review to Dr. Ottie Glazier, Medical Director.               Comments: Initial ITP

## 2021-07-14 ENCOUNTER — Encounter: Payer: Medicare HMO | Admitting: *Deleted

## 2021-07-14 ENCOUNTER — Ambulatory Visit: Payer: Medicare HMO | Admitting: Urology

## 2021-07-14 ENCOUNTER — Other Ambulatory Visit: Payer: Self-pay

## 2021-07-14 DIAGNOSIS — I5020 Unspecified systolic (congestive) heart failure: Secondary | ICD-10-CM | POA: Diagnosis not present

## 2021-07-14 DIAGNOSIS — I5022 Chronic systolic (congestive) heart failure: Secondary | ICD-10-CM

## 2021-07-14 NOTE — Progress Notes (Signed)
Daily Session Note  Patient Details  Name: Mandy Jones MRN: [id number] Date of Birth: April 10, 1951 Referring Provider:   Flowsheet Row Pulmonary Rehab from 07/08/2021 in [facility] Cardiac and Pulmonary Rehab  Referring Provider Serafina Royals MD       Encounter Date: 07/14/2021  Check In:  Session Check In - 07/14/21 1532       Check-In   Supervising physician immediately available to respond to emergencies See telemetry face sheet for immediately available ER MD    Location [facility]-Cardiac & Pulmonary Rehab    Staff Present Renita Papa, RN Sherryl Barters, MPA, Nino Glow, MS, ASCM CEP, Exercise Physiologist    Virtual Visit No    Medication changes reported     No    Fall or balance concerns reported    No    Warm-up and Cool-down Performed on first and last piece of equipment    Resistance Training Performed Yes    VAD Patient? No    PAD/SET Patient? No      Pain Assessment   Currently in Pain? No/denies                Social History   Tobacco Use  Smoking Status Former  Smokeless Tobacco Never  Tobacco Comments   quit 30 years ago    Goals Met:  Independence with exercise equipment Exercise tolerated well No report of concerns or symptoms today Strength training completed today  Goals Unmet:  Not Applicable  Comments: First full day of exercise!  Patient was oriented to gym and equipment including functions, settings, policies, and procedures.  Patient's individual exercise prescription and treatment plan were reviewed.  All starting workloads were established based on the results of the 6 minute walk test done at initial orientation visit.  The plan for exercise progression was also introduced and progression will be customized based on patient's performance and goals.     Dr. Emily Filbert is Medical Director for [facility].  Dr. Ottie Glazier is Medical Director for [facility] Pulmonary Rehabilitation.

## 2021-07-15 ENCOUNTER — Other Ambulatory Visit: Payer: Self-pay

## 2021-07-15 ENCOUNTER — Encounter: Payer: Self-pay | Admitting: Urology

## 2021-07-15 ENCOUNTER — Ambulatory Visit: Payer: Medicare HMO | Admitting: Urology

## 2021-07-15 VITALS — BP 140/73 | HR 78 | Ht 62.0 in | Wt 229.7 lb

## 2021-07-15 DIAGNOSIS — D179 Benign lipomatous neoplasm, unspecified: Secondary | ICD-10-CM | POA: Diagnosis not present

## 2021-07-15 DIAGNOSIS — N200 Calculus of kidney: Secondary | ICD-10-CM | POA: Diagnosis not present

## 2021-07-15 DIAGNOSIS — N2889 Other specified disorders of kidney and ureter: Secondary | ICD-10-CM

## 2021-07-15 NOTE — Progress Notes (Signed)
   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  Her preop CT showed a small lesion in the left kidney and a further MRI was recommended.  MRI in September 2021 showed a less than 1 cm left renal lesion with differential including hemorrhagic cyst or small papillary RCC.  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  He opted for ongoing surveillance.  I personally viewed and interpreted the renal ultrasound dated 06/01/2021 that shows a relatively stable 2 cm left angiomyolipoma, and no definitive evidence of malignancy.  We discussed the differences between comparing lesion size on MRI versus ultrasound.  We reviewed angiomyolipoma or benign lesions, but that they can potentially enlarge and cause bleeding.  I recommended ongoing surveillance.  We discussed general stone prevention strategies including adequate hydration with goal of producing 2.5 L of urine daily, increasing citric acid intake, increasing calcium intake during high oxalate meals, minimizing animal protein, and decreasing salt intake. Information about dietary recommendations given today.   RTC 1 year for renal ultrasound surveillance of left AML  Billey Co, MD  [facility] [address] [place], [place] [address] [phone]

## 2021-07-15 NOTE — Patient Instructions (Signed)
Dietary Guidelines to Help Prevent Kidney Stones Kidney stones are deposits of minerals and salts that form inside your kidneys. Your risk of developing kidney stones may be greater depending on your diet, your lifestyle, the medicines you take, and whether you have certain medical conditions. Most people can lower their chances of developing kidney stones by following the instructions below. Your dietitian may give you more specific instructions depending on your overall health and the type of kidney stones you tend to develop. What are tips for following this plan? Reading food labels  Choose foods with "no salt added" or "low-salt" labels. Limit your salt (sodium) intake to less than 1,500 mg a day. Choose foods with calcium for each meal and snack. Try to eat about 300 mg of calcium at each meal. Foods that contain 200-500 mg of calcium a serving include: 8 oz (237 mL) of milk, calcium-fortifiednon-dairy milk, and calcium-fortifiedfruit juice. Calcium-fortified means that calcium has been added to these drinks. 8 oz (237 mL) of kefir, yogurt, and soy yogurt. 4 oz (114 g) of tofu. 1 oz (28 g) of cheese. 1 cup (150 g) of dried figs. 1 cup (91 g) of cooked broccoli. One 3 oz (85 g) can of sardines or mackerel. Most people need 1,000-1,500 mg of calcium a day. Talk to your dietitian about how much calcium is recommended for you. Shopping Buy plenty of fresh fruits and vegetables. Most people do not need to avoid fruits and vegetables, even if these foods contain nutrients that may contribute to kidney stones. When shopping for convenience foods, choose: Whole pieces of fruit. Pre-made salads with dressing on the side. Low-fat fruit and yogurt smoothies. Avoid buying frozen meals or prepared deli foods. These can be high in sodium. Look for foods with live cultures, such as yogurt and kefir. Choose high-fiber grains, such as whole-wheat breads, oat bran, and wheat cereals. Cooking Do not add  salt to food when cooking. Place a salt shaker on the table and allow each person to add his or her own salt to taste. Use vegetable protein, such as beans, textured vegetable protein (TVP), or tofu, instead of meat in pasta, casseroles, and soups. Meal planning Eat less salt, if told by your dietitian. To do this: Avoid eating processed or pre-made food. Avoid eating fast food. Eat less animal protein, including cheese, meat, poultry, or fish, if told by your dietitian. To do this: Limit the number of times you have meat, poultry, fish, or cheese each week. Eat a diet free of meat at least 2 days a week. Eat only one serving each day of meat, poultry, fish, or seafood. When you prepare animal protein, cut pieces into small portion sizes. For most meat and fish, one serving is about the size of the palm of your hand. Eat at least five servings of fresh fruits and vegetables each day. To do this: Keep fruits and vegetables on hand for snacks. Eat one piece of fruit or a handful of berries with breakfast. Have a salad and fruit at lunch. Have two kinds of vegetables at dinner. Limit foods that are high in a substance called oxalate. These include: Spinach (cooked), rhubarb, beets, sweet potatoes, and Swiss chard. Peanuts. Potato chips, french fries, and baked potatoes with skin on. Nuts and nut products. Chocolate. If you regularly take a diuretic medicine, make sure to eat at least 1 or 2 servings of fruits or vegetables that are high in potassium each day. These include: Avocado. Banana. Orange, prune,   carrot, or tomato juice. Baked potato. Cabbage. Beans and split peas. Lifestyle  Drink enough fluid to keep your urine pale yellow. This is the most important thing you can do. Spread your fluid intake throughout the day. If you drink alcohol: Limit how much you use to: 0-1 drink a day for women who are not pregnant. 0-2 drinks a day for men. Be aware of how much alcohol is in your  drink. In the [place] one drink equals one 12 oz bottle of beer (355 mL), one 5 oz glass of wine (148 mL), or one 1 oz glass of hard liquor (44 mL). Lose weight if told by your health care provider. Work with your dietitian to find an eating plan and weight loss strategies that work best for you. General information Talk to your health care provider and dietitian about taking daily supplements. You may be told the following depending on your health and the cause of your kidney stones: Not to take supplements with vitamin C. To take a calcium supplement. To take a daily probiotic supplement. To take other supplements such as magnesium, fish oil, or vitamin B6. Take over-the-counter and prescription medicines only as told by your health care provider. These include supplements. What foods should I limit? Limit your intake of the following foods, or eat them as told by your dietitian. Vegetables Spinach. Rhubarb. Beets. Canned vegetables. Pickles. Olives. Baked potatoes with skin. Grains Wheat bran. Baked goods. Salted crackers. Cereals high in sugar. Meats and other proteins Nuts. Nut butters. Large portions of meat, poultry, or fish. Salted, precooked, or cured meats, such as sausages, meat loaves, and hot dogs. Dairy Cheese. Beverages Regular soft drinks. Regular vegetable juice. Seasonings and condiments Seasoning blends with salt. Salad dressings. Soy sauce. Ketchup. Barbecue sauce. Other foods Canned soups. Canned pasta sauce. Casseroles. Pizza. Lasagna. Frozen meals. Potato chips. French fries. The items listed above may not be a complete list of foods and beverages you should limit. Contact a dietitian for more information. What foods should I avoid? Talk to your dietitian about specific foods you should avoid based on the type of kidney stones you have and your overall health. Fruits Grapefruit. The item listed above may not be a complete list of foods and beverages you should  avoid. Contact a dietitian for more information. Summary Kidney stones are deposits of minerals and salts that form inside your kidneys. You can lower your risk of kidney stones by making changes to your diet. The most important thing you can do is drink enough fluid. Drink enough fluid to keep your urine pale yellow. Talk to your dietitian about how much calcium you should have each day, and eat less salt and animal protein as told by your dietitian. This information is not intended to replace advice given to you by your health care provider. Make sure you discuss any questions you have with your health care provider. Document Revised: 08/08/2019 Document Reviewed: 08/08/2019 Elsevier Patient Education  2022 Elsevier Inc.  

## 2021-07-19 ENCOUNTER — Other Ambulatory Visit: Payer: Self-pay

## 2021-07-19 DIAGNOSIS — I5022 Chronic systolic (congestive) heart failure: Secondary | ICD-10-CM

## 2021-07-19 DIAGNOSIS — I5020 Unspecified systolic (congestive) heart failure: Secondary | ICD-10-CM | POA: Diagnosis not present

## 2021-07-19 NOTE — Progress Notes (Signed)
Daily Session Note  Patient Details  Name: Mandy Jones MRN: [id number] Date of Birth: 1951-01-30 Referring Provider:   Flowsheet Row Pulmonary Rehab from 07/08/2021 in [facility] Cardiac and Pulmonary Rehab  Referring Provider Serafina Royals MD       Encounter Date: 07/19/2021  Check In:  Session Check In - 07/19/21 1533       Check-In   Supervising physician immediately available to respond to emergencies See telemetry face sheet for immediately available ER MD    Location [facility]-Cardiac & Pulmonary Rehab    Staff Present Birdie Sons, MPA, Nino Glow, MS, ASCM CEP, Exercise Physiologist;Joseph Tessie Fass, [place]    Virtual Visit No    Medication changes reported     No    Fall or balance concerns reported    No    Warm-up and Cool-down Performed on first and last piece of equipment    Resistance Training Performed Yes    VAD Patient? No    PAD/SET Patient? No      Pain Assessment   Currently in Pain? No/denies                Social History   Tobacco Use  Smoking Status Former  Smokeless Tobacco Never  Tobacco Comments   quit 30 years ago    Goals Met:  Independence with exercise equipment Exercise tolerated well No report of concerns or symptoms today Strength training completed today  Goals Unmet:  Not Applicable  Comments: Pt able to follow exercise prescription today without complaint.  Will continue to monitor for progression.    Dr. Emily Filbert is Medical Director for [facility].  Dr. Ottie Glazier is Medical Director for [facility] Pulmonary Rehabilitation.

## 2021-07-21 ENCOUNTER — Other Ambulatory Visit: Payer: Self-pay

## 2021-07-21 ENCOUNTER — Encounter: Payer: Medicare HMO | Admitting: *Deleted

## 2021-07-21 DIAGNOSIS — I5020 Unspecified systolic (congestive) heart failure: Secondary | ICD-10-CM | POA: Diagnosis not present

## 2021-07-21 DIAGNOSIS — I5022 Chronic systolic (congestive) heart failure: Secondary | ICD-10-CM

## 2021-07-21 NOTE — Progress Notes (Signed)
Daily Session Note  Patient Details  Name: Mandy Jones MRN: [id number] Date of Birth: 1950-11-04 Referring Provider:   Flowsheet Row Pulmonary Rehab from 07/08/2021 in [facility] Cardiac and Pulmonary Rehab  Referring Provider Serafina Royals MD       Encounter Date: 07/21/2021  Check In:      Social History   Tobacco Use  Smoking Status Former  Smokeless Tobacco Never  Tobacco Comments   quit 30 years ago    Goals Met:    Goals Unmet:    Comments: Pt able to follow exercise prescription today without complaint.  Will continue to monitor for progression.    Dr. Emily Filbert is Medical Director for [facility].  Dr. Ottie Glazier is Medical Director for [facility] Pulmonary Rehabilitation.

## 2021-07-26 ENCOUNTER — Encounter: Payer: Medicare HMO | Admitting: *Deleted

## 2021-07-26 ENCOUNTER — Other Ambulatory Visit: Payer: Self-pay

## 2021-07-26 ENCOUNTER — Ambulatory Visit: Payer: Medicare HMO | Admitting: Family

## 2021-07-26 DIAGNOSIS — I5022 Chronic systolic (congestive) heart failure: Secondary | ICD-10-CM

## 2021-07-26 DIAGNOSIS — I5020 Unspecified systolic (congestive) heart failure: Secondary | ICD-10-CM | POA: Diagnosis not present

## 2021-07-26 NOTE — Progress Notes (Signed)
Daily Session Note  Patient Details  Name: Mandy Jones MRN: [id number] Date of Birth: 10-26-1950 Referring Provider:   Flowsheet Row Pulmonary Rehab from 07/08/2021 in [facility] Cardiac and Pulmonary Rehab  Referring Provider Serafina Royals MD       Encounter Date: 07/26/2021  Check In:  Session Check In - 07/26/21 1542       Check-In   Supervising physician immediately available to respond to emergencies See telemetry face sheet for immediately available ER MD    Location [facility]-Cardiac & Pulmonary Rehab    Staff Present Renita Papa, RN BSN;Joseph [place], RCP,RRT,BSRT;Kara Parc, [place], ASCM CEP, Exercise Physiologist    Virtual Visit No    Medication changes reported     No    Fall or balance concerns reported    No    Warm-up and Cool-down Performed on first and last piece of equipment    Resistance Training Performed Yes    VAD Patient? No    PAD/SET Patient? No      Pain Assessment   Currently in Pain? No/denies                Social History   Tobacco Use  Smoking Status Former  Smokeless Tobacco Never  Tobacco Comments   quit 30 years ago    Goals Met:  Independence with exercise equipment Exercise tolerated well No report of concerns or symptoms today Strength training completed today  Goals Unmet:  Not Applicable  Comments: Pt able to follow exercise prescription today without complaint.  Will continue to monitor for progression.    Dr. Emily Filbert is Medical Director for [facility].  Dr. Ottie Glazier is Medical Director for [facility] Pulmonary Rehabilitation.

## 2021-07-27 NOTE — Progress Notes (Signed)
Patient ID: Mandy Jones, female    DOB: [birth year], 70 y.o.   MRN: [id number]  HPI  Mandy Jones is a 70 y/o female with a history of atrial fibrillation, HTN, thyroid disease, GERD, sleep apnea, depression, fibromyalgia, hyperlipidemia, previous tobacco use and chronic heart failure.   Echo report from 10/15/20 reviewed and showed an EF of 40%. Echo report from 02/10/20 reviewed and showed an EF of 55-60% along with mild LAE.   Admitted 02/23/21 due to fever, cough & shortness of breath due to pneumonia. IV antibiotics initially given with transition to oral medication. Discharged after 2 days.   She presents today for a follow-up visit with a chief complaint of minimal shortness of breath upon moderate exertion. She describes this as chronic in nature having been present for several years. She has associated fatigue, abdominal distention, chronic pain, difficulty sleeping (due to urination) and gradual weight gain along with this. She denies any dizziness, palpitations, pedal edema, chest pain or cough.   Is feeling depressed due to her weight gain. Says that she was down to 218 pounds but is now back up into the 230's. Is participating in pulmonary rehab  Takes her furosemide ~ 5 pm each day and understands that taking it that late will interfere with her sleeping. She prefers to take it late in the day because she says that she's very busy during the day and is out and about and she doesn't want to have to worry about urinary urgency etc  Past Medical History:  Diagnosis Date   Arrhythmia    atrial fibrillation   Arthritis    CHF (congestive heart failure) ([place])    Depression    Dyspnea    Dysrhythmia    Fibromyalgia 1992   GERD (gastroesophageal reflux disease)    Hepatitis    Hepatitis B 1985   Hyperlipidemia    Hypertension    Mitral valve prolapse    Pacemaker    Pacemaker 01/13/2021   Sleep apnea    CPAP   Tendon tear, ankle    rt foot   Thyroid dysfunction     Past Surgical History:  Procedure Laterality Date   ACHILLES TENDON SURGERY Right 04/06/2018   Procedure: ACHILLES TENDON REPAIR;  Surgeon: [name], DPM;  Location: [facility];  Service: Podiatry;  Laterality: Right;   CARDIOVERSION N/A 02/06/2018   Procedure: CARDIOVERSION;  Surgeon: Corey Skains, MD;  Location: [facility];  Service: Cardiovascular;  Laterality: N/A;   CARDIOVERSION N/A 03/28/2018   Procedure: CARDIOVERSION;  Surgeon: Corey Skains, MD;  Location: [facility];  Service: Cardiovascular;  Laterality: N/A;   CARDIOVERSION N/A 05/09/2018   Procedure: CARDIOVERSION;  Surgeon: Corey Skains, MD;  Location: [facility];  Service: Cardiovascular;  Laterality: N/A;   CARDIOVERSION N/A 10/23/2019   Procedure: CARDIOVERSION;  Surgeon: Corey Skains, MD;  Location: [facility];  Service: Cardiovascular;  Laterality: N/A;   CARDIOVERSION N/A 09/30/2020   Procedure: CARDIOVERSION;  Surgeon: Corey Skains, MD;  Location: [facility];  Service: Cardiovascular;  Laterality: N/A;   CARDIOVERSION N/A 11/19/2020   Procedure: CARDIOVERSION;  Surgeon: Corey Skains, MD;  Location: [facility];  Service: Cardiovascular;  Laterality: N/A;   CARDIOVERSION N/A 03/16/2021   Procedure: CARDIOVERSION;  Surgeon: Corey Skains, MD;  Location: [facility];  Service: Cardiovascular;  Laterality: N/A;   CHOLECYSTECTOMY     CONTINUOUS NERVE MONITORING N/A 08/03/2017   Procedure: LARYNGEAL NERVE MONITORING;  Surgeon: Carloyn Manner, MD;  Location: [facility];  Service: ENT;  Laterality: N/A;   EXTRACORPOREAL SHOCK WAVE LITHOTRIPSY Right 02/13/2020   Procedure: EXTRACORPOREAL SHOCK WAVE LITHOTRIPSY (ESWL);  Surgeon: Billey Co, MD;  Location: [facility];  Service: Urology;  Laterality: Right;   EYE SURGERY Bilateral    FOOT SURGERY Left 2000   JOINT REPLACEMENT     left knee   OSTECTOMY Right 04/06/2018   Procedure: OSTECTOMY-HAGLUNDS/RECTROCALCANEAL;  Surgeon: [name], DPM;  Location: [facility];  Service: Podiatry;  Laterality: Right;   SHOULDER ARTHROSCOPY Bilateral    SHOULDER ARTHROSCOPY Left    THYROIDECTOMY N/A 08/03/2017   Procedure: THYROIDECTOMY;  Surgeon: Carloyn Manner, MD;  Location: [facility];  Service: ENT;  Laterality: N/A;   TONSILLECTOMY     TUBAL LIGATION     Family History  Problem Relation Age of Onset   Hypertension Mother    Gastric cancer Father    Social History   Tobacco Use   Smoking status: Former   Smokeless tobacco: Never   Tobacco comments:    quit 30 years ago  Substance Use Topics   Alcohol use: Yes    Comment: occassional   Allergies  Allergen Reactions   Silver Other (See Comments)     Causes blisters.   Tape Rash    Pads used for ablation left a rash for a couple of days.   Prior to Admission medications   Medication Sig Start Date End Date Taking? Authorizing Provider  apixaban (ELIQUIS) 5 MG TABS tablet Take 5 mg by mouth every 12 (twelve) hours. 02/14/20  Yes Val Riles, MD  Ascorbic Acid (VITAMIN C) 1000 MG tablet Take 1,000 mg by mouth every evening.   Yes [provider]  Ashwagandha 500 MG CAPS Take 1,000 mg by mouth daily.   Yes [provider]  B Complex-C (SUPER B COMPLEX PO) Take 1 tablet by mouth daily.   Yes [provider]  Boswellia-Glucosamine-Vit D (OSTEO BI-FLEX ONE PER DAY) TABS Take 1 tablet by mouth daily.   Yes [provider]  Cholecalciferol (VITAMIN D3) 50 MCG (2000 UT) TABS Take 2,000 Units by mouth daily.   Yes [provider]  Coenzyme Q10 (CO Q 10) 100 MG CAPS Take 100 mg by mouth daily.   Yes [provider]  famotidine (PEPCID) 20 MG tablet Take 40 mg by mouth at bedtime. 10/09/19  Yes [provider]  furosemide (LASIX) 40 MG tablet Take 1 tablet (40 mg total) by mouth 2 (two) times daily. 02/11/20  Yes Val Riles, MD  levothyroxine (SYNTHROID) 150 MCG tablet Take 150 mcg by mouth daily before breakfast. 10/15/19  Yes [provider]  melatonin 5 MG TABS Take 5 mg by mouth at bedtime as needed (sleep).   Yes [provider]  Multiple Vitamins-Minerals (PRESERVISION AREDS 2 PO) Take 1 tablet by mouth daily.   Yes [provider]  potassium chloride (KLOR-CON) 10 MEQ tablet Take 20 mEq by mouth daily. 09/12/20  Yes [provider]  Red Yeast Rice Extract 600 MG CAPS Take 600 mg by mouth every evening.   Yes [provider]  sacubitril-valsartan (ENTRESTO) 24-26 MG Take 1 tablet by mouth 2 (two) times daily. 03/03/21  Yes Darylene Price A, FNP  spironolactone (ALDACTONE) 25 MG tablet Take 1 tablet (25 mg total) by mouth daily. 03/03/21  Yes Darylene Price A, FNP  traZODone (DESYREL) 50 MG tablet Take 50-150 mg by mouth at bedtime as needed for sleep. 12/04/17  Yes [provider]  valACYclovir (VALTREX) 1000 MG tablet Take 2,000 mg by mouth daily as needed (For breakout). 11/23/20  Yes [provider]  XIIDRA 5 % SOLN Apply 1 drop to eye 2 (two) times daily. 05/25/21  Yes [provider]  Calcium-Magnesium-Vitamin D (CALCIUM MAGNESIUM PO) Take 1 tablet by mouth daily. Patient not taking: Reported on 07/28/2021    [provider]    Review of Systems  Constitutional:  Positive for fatigue. Negative for appetite change.  HENT:  Negative for congestion, postnasal drip and sore throat.   Eyes: Negative.   Respiratory:  Positive for shortness of breath (minimal). Negative for cough and chest tightness.   Cardiovascular:  Negative for chest pain, palpitations and leg swelling.  Gastrointestinal:  Positive for abdominal distention. Negative for abdominal pain.  Endocrine: Negative.   Musculoskeletal:  Positive for arthralgias (left shoulder) and myalgias (right thigh). Negative for back pain.  Skin: Negative.   Allergic/Immunologic: Negative.   Neurological:  Negative for dizziness and light-headedness.  Hematological:  Negative for adenopathy. Does not  bruise/bleed easily.  Psychiatric/Behavioral:  Positive for sleep disturbance (interrupted sleep; sleeping on 1-2 pillows; wearing CPAP most nights). Negative for dysphoric mood. The patient is not nervous/anxious.    Vitals:   07/28/21 1221  BP: 113/66  Pulse: 100  Resp: 18  SpO2: 97%  Weight: 235 lb 3 oz (106.7 kg)  Height: 5\' 2"  (1.575 m)   Wt Readings from Last 3 Encounters:  07/28/21 235 lb 3 oz (106.7 kg)  07/15/21 229 lb 11.2 oz (104.2 kg)  07/08/21 227 lb 14.4 oz (103.4 kg)   Lab Results  Component Value Date   CREATININE 0.74 04/21/2021   CREATININE 0.62 02/25/2021   CREATININE 0.54 02/24/2021   Physical Exam Vitals and nursing note reviewed.  Constitutional:      Appearance: Normal appearance.  HENT:     Head: Normocephalic and atraumatic.  Cardiovascular:     Rate and Rhythm: Normal rate. Rhythm irregular.  Pulmonary:     Effort: Pulmonary effort is normal. No respiratory distress.     Breath sounds: No wheezing or rales.  Abdominal:     General: There is distension.     Palpations: Abdomen is soft.  Musculoskeletal:        General: No tenderness.     Cervical back: Normal range of motion and neck supple.     Right lower leg: No edema.     Left lower leg: No edema.  Skin:    General: Skin is warm and dry.  Neurological:     General: No focal deficit present.     Mental Status: She is alert and oriented to person, place, and time.  Psychiatric:        Mood and Affect: Mood normal.        Behavior: Behavior normal.        Thought Content: Thought content normal.   Assessment & Plan:  1: Chronic heart failure with reduced ejection fraction- - NYHA class II - euvolemic today - weighing daily; reminded to call for an overnight weight gain of >2 pounds or a weekly weight gain of >5 pounds - weight up 7 pounds from last visit here 3 months ago; she says that it was as low as 218  - not adding salt except to pasta water - saw cardiology Nehemiah Massed)  05/26/21 - on GDMT of entresto, metoprolol & spironolactone - will change diuretic to torsemide 50mg  daily; she was  instructed to stop taking furosemide when she picks up the torsemide - will check BMP at next visit - participating in pulmonary rehab - saw pulmonology Raul Del) 07/15/21 - BNP 02/22/21 was 108.8  2: HTN- - BP looks good today - saw PCP Quentin Cornwall) 03/29/21 - BMP 04/21/21 reviewed and showed sodium 139, potassium 4.2, creatinine 0.74 and GFR >60  3: Atrial fibrillation- - previous ablation & cardioversion done  - on apixaban - saw EP Marcello Moores) 04/09/21   Patient did not bring her medications nor a list. Each medication was verbally reviewed with the patient and she was encouraged to bring the bottles to every visit to confirm accuracy of list.  Return in 1 month or sooner for any questions/problems before then.

## 2021-07-28 ENCOUNTER — Other Ambulatory Visit: Payer: Self-pay

## 2021-07-28 ENCOUNTER — Ambulatory Visit: Payer: Medicare HMO | Attending: Family | Admitting: Family

## 2021-07-28 ENCOUNTER — Encounter: Payer: Self-pay | Admitting: *Deleted

## 2021-07-28 ENCOUNTER — Encounter: Payer: Self-pay | Admitting: Family

## 2021-07-28 VITALS — BP 113/66 | HR 100 | Resp 18 | Ht 62.0 in | Wt 235.2 lb

## 2021-07-28 DIAGNOSIS — Z79899 Other long term (current) drug therapy: Secondary | ICD-10-CM | POA: Insufficient documentation

## 2021-07-28 DIAGNOSIS — M25512 Pain in left shoulder: Secondary | ICD-10-CM | POA: Insufficient documentation

## 2021-07-28 DIAGNOSIS — I10 Essential (primary) hypertension: Secondary | ICD-10-CM

## 2021-07-28 DIAGNOSIS — Z8701 Personal history of pneumonia (recurrent): Secondary | ICD-10-CM | POA: Insufficient documentation

## 2021-07-28 DIAGNOSIS — G8929 Other chronic pain: Secondary | ICD-10-CM | POA: Diagnosis not present

## 2021-07-28 DIAGNOSIS — R39198 Other difficulties with micturition: Secondary | ICD-10-CM | POA: Insufficient documentation

## 2021-07-28 DIAGNOSIS — Z7901 Long term (current) use of anticoagulants: Secondary | ICD-10-CM | POA: Diagnosis not present

## 2021-07-28 DIAGNOSIS — M79651 Pain in right thigh: Secondary | ICD-10-CM | POA: Diagnosis not present

## 2021-07-28 DIAGNOSIS — G4730 Sleep apnea, unspecified: Secondary | ICD-10-CM | POA: Diagnosis not present

## 2021-07-28 DIAGNOSIS — F32A Depression, unspecified: Secondary | ICD-10-CM | POA: Diagnosis not present

## 2021-07-28 DIAGNOSIS — E785 Hyperlipidemia, unspecified: Secondary | ICD-10-CM | POA: Insufficient documentation

## 2021-07-28 DIAGNOSIS — I480 Paroxysmal atrial fibrillation: Secondary | ICD-10-CM | POA: Diagnosis not present

## 2021-07-28 DIAGNOSIS — G479 Sleep disorder, unspecified: Secondary | ICD-10-CM | POA: Diagnosis not present

## 2021-07-28 DIAGNOSIS — K219 Gastro-esophageal reflux disease without esophagitis: Secondary | ICD-10-CM | POA: Insufficient documentation

## 2021-07-28 DIAGNOSIS — Z9989 Dependence on other enabling machines and devices: Secondary | ICD-10-CM | POA: Diagnosis not present

## 2021-07-28 DIAGNOSIS — Z87891 Personal history of nicotine dependence: Secondary | ICD-10-CM | POA: Diagnosis not present

## 2021-07-28 DIAGNOSIS — I4891 Unspecified atrial fibrillation: Secondary | ICD-10-CM | POA: Diagnosis not present

## 2021-07-28 DIAGNOSIS — I110 Hypertensive heart disease with heart failure: Secondary | ICD-10-CM | POA: Diagnosis not present

## 2021-07-28 DIAGNOSIS — I5022 Chronic systolic (congestive) heart failure: Secondary | ICD-10-CM

## 2021-07-28 DIAGNOSIS — R140 Abdominal distension (gaseous): Secondary | ICD-10-CM | POA: Insufficient documentation

## 2021-07-28 DIAGNOSIS — M797 Fibromyalgia: Secondary | ICD-10-CM | POA: Diagnosis not present

## 2021-07-28 DIAGNOSIS — E079 Disorder of thyroid, unspecified: Secondary | ICD-10-CM | POA: Insufficient documentation

## 2021-07-28 MED ORDER — TORSEMIDE 100 MG PO TABS: 50.0000 mg | ORAL_TABLET | Freq: Every day | ORAL | 3 refills | Status: DC

## 2021-07-28 NOTE — Progress Notes (Signed)
Pulmonary Individual Treatment Plan  Patient Details  Name: Mandy Jones MRN: [id number] Date of Birth: 10/22/1950 Referring Provider:   Flowsheet Row Pulmonary Rehab from 07/08/2021 in [facility] Cardiac and Pulmonary Rehab  Referring Provider Serafina Royals MD       Initial Encounter Date:  Flowsheet Row Pulmonary Rehab from 07/08/2021 in [facility] Cardiac and Pulmonary Rehab  Date 07/08/21       Visit Diagnosis: Heart failure, chronic systolic ([place])  Patient's Home Medications on Admission:  Current Outpatient Medications:    apixaban (ELIQUIS) 5 MG TABS tablet, Take 5 mg by mouth every 12 (twelve) hours., Disp: 60 tablet, Rfl: 0   Ascorbic Acid (VITAMIN C) 1000 MG tablet, Take 1,000 mg by mouth every evening., Disp: , Rfl:    Ashwagandha 500 MG CAPS, Take 1,000 mg by mouth daily., Disp: , Rfl:    B Complex-C (SUPER B COMPLEX PO), Take 1 tablet by mouth daily., Disp: , Rfl:    Boswellia-Glucosamine-Vit D (OSTEO BI-FLEX ONE PER DAY) TABS, Take 1 tablet by mouth daily., Disp: , Rfl:    Calcium-Magnesium-Vitamin D (CALCIUM MAGNESIUM PO), Take 1 tablet by mouth daily., Disp: , Rfl:    Cholecalciferol (VITAMIN D3) 50 MCG (2000 UT) TABS, Take 2,000 Units by mouth daily., Disp: , Rfl:    Coenzyme Q10 (CO Q 10) 100 MG CAPS, Take 100 mg by mouth daily., Disp: , Rfl:    famotidine (PEPCID) 20 MG tablet, Take 40 mg by mouth at bedtime., Disp: , Rfl:    furosemide (LASIX) 40 MG tablet, Take 1 tablet (40 mg total) by mouth 2 (two) times daily. (Patient taking differently: Take 40 mg by mouth daily.), Disp: 60 tablet, Rfl: 0   levothyroxine (SYNTHROID) 150 MCG tablet, Take 150 mcg by mouth daily before breakfast., Disp: , Rfl:    melatonin 5 MG TABS, Take 5 mg by mouth at bedtime as needed (sleep)., Disp: , Rfl:    Multiple Vitamins-Minerals (PRESERVISION AREDS 2 PO), Take 1 tablet by mouth daily., Disp: , Rfl:    potassium chloride (KLOR-CON) 10 MEQ tablet, Take 20 mEq by mouth daily., Disp:  , Rfl:    Red Yeast Rice Extract 600 MG CAPS, Take 600 mg by mouth every evening., Disp: , Rfl:    sacubitril-valsartan (ENTRESTO) 24-26 MG, Take 1 tablet by mouth 2 (two) times daily., Disp: 60 tablet, Rfl: 5   spironolactone (ALDACTONE) 25 MG tablet, Take 1 tablet (25 mg total) by mouth daily., Disp: 30 tablet, Rfl: 5   traZODone (DESYREL) 50 MG tablet, Take 50-150 mg by mouth at bedtime as needed for sleep., Disp: , Rfl: 1   valACYclovir (VALTREX) 1000 MG tablet, Take 2,000 mg by mouth daily as needed (For breakout)., Disp: , Rfl:    XIIDRA 5 % SOLN, Apply 1 drop to eye 2 (two) times daily., Disp: , Rfl:   Past Medical History: Past Medical History:  Diagnosis Date   Arrhythmia    atrial fibrillation   Arthritis    CHF (congestive heart failure) (HCC)    Depression    Dyspnea    Dysrhythmia    Fibromyalgia 1992   GERD (gastroesophageal reflux disease)    Hepatitis    Hepatitis B 1985   Hyperlipidemia    Hypertension    Mitral valve prolapse    Pacemaker    Pacemaker 01/13/2021   Sleep apnea    CPAP   Tendon tear, ankle    rt foot   Thyroid dysfunction  Tobacco Use: Social History   Tobacco Use  Smoking Status Former  Smokeless Tobacco Never  Tobacco Comments   quit 30 years ago    Labs: Recent Review Flowsheet Data   There is no flowsheet data to display.      Pulmonary Assessment Scores:  Pulmonary Assessment Scores     Row Name 07/08/21 1125         ADL UCSD   ADL Phase Entry     SOB Score total 63     Rest 4     Walk 5     Stairs 5     Bath 1     Dress 1     Shop 2       CAT Score   CAT Score 17       mMRC Score   mMRC Score 3              UCSD: Self-administered rating of dyspnea associated with activities of daily living (ADLs) 6-point scale (0 = "not at all" to 5 = "maximal or unable to do because of breathlessness")  Scoring Scores range from 0 to 120.  Minimally important difference is 5 units  CAT: CAT can identify  the health impairment of COPD patients and is better correlated with disease progression.  CAT has a scoring range of zero to 40. The CAT score is classified into four groups of low (less than 10), medium (10 - 20), high (21-30) and very high (31-40) based on the impact level of disease on health status. A CAT score over 10 suggests significant symptoms.  A worsening CAT score could be explained by an exacerbation, poor medication adherence, poor inhaler technique, or progression of COPD or comorbid conditions.  CAT MCID is 2 points  mMRC: mMRC (Modified Medical Research Council) Dyspnea Scale is used to assess the degree of baseline functional disability in patients of respiratory disease due to dyspnea. No minimal important difference is established. A decrease in score of 1 point or greater is considered a positive change.   Pulmonary Function Assessment:   Exercise Target Goals: Exercise Program Goal: Individual exercise prescription set using results from initial 6 min walk test and THRR while considering  patient's activity barriers and safety.   Exercise Prescription Goal: Initial exercise prescription builds to 30-45 minutes a day of aerobic activity, 2-3 days per week.  Home exercise guidelines will be given to patient during program as part of exercise prescription that the participant will acknowledge.  Education: Aerobic Exercise: - Group verbal and visual presentation on the components of exercise prescription. Introduces F.I.T.T principle from ACSM for exercise prescriptions.  Reviews F.I.T.T. principles of aerobic exercise including progression. Written material given at graduation. Flowsheet Row Pulmonary Rehab from 07/21/2021 in [facility] Cardiac and Pulmonary Rehab  Education need identified 07/08/21 (P)   Date 07/14/21 (P)   Educator [facility] (P)   Instruction Review Code 1- Verbalizes Understanding (P)        Education: Resistance Exercise: - Group verbal and visual  presentation on the components of exercise prescription. Introduces F.I.T.T principle from ACSM for exercise prescriptions  Reviews F.I.T.T. principles of resistance exercise including progression. Written material given at graduation.    Education: Exercise & Equipment Safety: - Individual verbal instruction and demonstration of equipment use and safety with use of the equipment. Flowsheet Row Pulmonary Rehab from 07/21/2021 in [facility] Cardiac and Pulmonary Rehab  Education need identified 07/08/21 (P)   Date 07/08/21 (P)   Educator Craig Staggers (  P)   Instruction Review Code 1- Verbalizes Understanding (P)        Education: Exercise Physiology & General Exercise Guidelines: - Group verbal and written instruction with models to review the exercise physiology of the cardiovascular system and associated critical values. Provides general exercise guidelines with specific guidelines to those with heart or lung disease.    Education: Flexibility, Balance, Mind/Body Relaxation: - Group verbal and visual presentation with interactive activity on the components of exercise prescription. Introduces F.I.T.T principle from ACSM for exercise prescriptions. Reviews F.I.T.T. principles of flexibility and balance exercise training including progression. Also discusses the mind body connection.  Reviews various relaxation techniques to help reduce and manage stress (i.e. Deep breathing, progressive muscle relaxation, and visualization). Balance handout provided to take home. Written material given at graduation.   Activity Barriers & Risk Stratification:  Activity Barriers & Cardiac Risk Stratification - 07/08/21 1121       Activity Barriers & Cardiac Risk Stratification   Activity Barriers Left Knee Replacement;Right Knee Replacement;Balance Concerns;Arthritis;Shortness of Breath;Assistive Device;Deconditioning;Muscular Weakness;Other (comment)    Comments Bursitis in left shoulder             6 Minute  Walk:  6 Minute Walk     Row Name 07/08/21 1130         6 Minute Walk   Phase Initial     Distance 875 feet     Walk Time 6 minutes     # of Rest Breaks 0     MPH 1.65     METS 1.16     RPE 12     Perceived Dyspnea  2     VO2 Peak 4.07     Symptoms Yes (comment)     Comments Felt unsteady on feet     Resting HR 79 bpm     Resting BP 104/64     Resting Oxygen Saturation  97 %     Exercise Oxygen Saturation  during 6 min walk 96 %     Max Ex. HR 105 bpm     Max Ex. BP 112/66     2 Minute Post BP 102/66       Interval HR   1 Minute HR 99     2 Minute HR 102     3 Minute HR 105     4 Minute HR 102     5 Minute HR 103     6 Minute HR 101     2 Minute Post HR 87     Interval Heart Rate? Yes       Interval Oxygen   Interval Oxygen? Yes     Baseline Oxygen Saturation % 97 %     1 Minute Oxygen Saturation % 96 %     1 Minute Liters of Oxygen 0 L  RA     2 Minute Oxygen Saturation % 97 %     2 Minute Liters of Oxygen 0 L     3 Minute Oxygen Saturation % 96 %     3 Minute Liters of Oxygen 0 L     4 Minute Oxygen Saturation % 96 %     4 Minute Liters of Oxygen 0 L     5 Minute Oxygen Saturation % 97 %     5 Minute Liters of Oxygen 0 L     6 Minute Oxygen Saturation % 96 %     6 Minute Liters of Oxygen 0 L  2 Minute Post Oxygen Saturation % 98 %     2 Minute Post Liters of Oxygen 0 L             Oxygen Initial Assessment:  Oxygen Initial Assessment - 07/08/21 1124       Home Oxygen   Home Oxygen Device None    Sleep Oxygen Prescription CPAP    Home Exercise Oxygen Prescription None    Home Resting Oxygen Prescription None    Compliance with Home Oxygen Use Yes      Initial 6 min Walk   Oxygen Used None      Program Oxygen Prescription   Program Oxygen Prescription None      Intervention   Short Term Goals To learn and exhibit compliance with exercise, home and travel O2 prescription;To learn and understand importance of monitoring SPO2 with pulse  oximeter and demonstrate accurate use of the pulse oximeter.;To learn and understand importance of maintaining oxygen saturations>88%;To learn and demonstrate proper pursed lip breathing techniques or other breathing techniques. ;To learn and demonstrate proper use of respiratory medications    Long  Term Goals Exhibits compliance with exercise, home  and travel O2 prescription;Maintenance of O2 saturations>88%;Exhibits proper breathing techniques, such as pursed lip breathing or other method taught during program session;Verbalizes importance of monitoring SPO2 with pulse oximeter and return demonstration;Compliance with respiratory medication             Oxygen Re-Evaluation:  Oxygen Re-Evaluation     Row Name 07/14/21 1535             Program Oxygen Prescription   Program Oxygen Prescription None         Home Oxygen   Home Oxygen Device None       Sleep Oxygen Prescription CPAP       Home Exercise Oxygen Prescription None       Home Resting Oxygen Prescription None       Compliance with Home Oxygen Use Yes         Goals/Expected Outcomes   Short Term Goals To learn and exhibit compliance with exercise, home and travel O2 prescription;To learn and understand importance of monitoring SPO2 with pulse oximeter and demonstrate accurate use of the pulse oximeter.;To learn and understand importance of maintaining oxygen saturations>88%;To learn and demonstrate proper pursed lip breathing techniques or other breathing techniques. ;To learn and demonstrate proper use of respiratory medications       Long  Term Goals Exhibits compliance with exercise, home  and travel O2 prescription;Maintenance of O2 saturations>88%;Exhibits proper breathing techniques, such as pursed lip breathing or other method taught during program session;Verbalizes importance of monitoring SPO2 with pulse oximeter and return demonstration;Compliance with respiratory medication       Comments Reviewed PLB technique with  pt.  Talked about how it works and it's importance in maintaining their exercise saturations.       Goals/Expected Outcomes Short: Become more profiecient at using PLB.   Long: Become independent at using PLB.                Oxygen Discharge (Final Oxygen Re-Evaluation):  Oxygen Re-Evaluation - 07/14/21 1535       Program Oxygen Prescription   Program Oxygen Prescription None      Home Oxygen   Home Oxygen Device None    Sleep Oxygen Prescription CPAP    Home Exercise Oxygen Prescription None    Home Resting Oxygen Prescription None    Compliance with Home Oxygen  Use Yes      Goals/Expected Outcomes   Short Term Goals To learn and exhibit compliance with exercise, home and travel O2 prescription;To learn and understand importance of monitoring SPO2 with pulse oximeter and demonstrate accurate use of the pulse oximeter.;To learn and understand importance of maintaining oxygen saturations>88%;To learn and demonstrate proper pursed lip breathing techniques or other breathing techniques. ;To learn and demonstrate proper use of respiratory medications    Long  Term Goals Exhibits compliance with exercise, home  and travel O2 prescription;Maintenance of O2 saturations>88%;Exhibits proper breathing techniques, such as pursed lip breathing or other method taught during program session;Verbalizes importance of monitoring SPO2 with pulse oximeter and return demonstration;Compliance with respiratory medication    Comments Reviewed PLB technique with pt.  Talked about how it works and it's importance in maintaining their exercise saturations.    Goals/Expected Outcomes Short: Become more profiecient at using PLB.   Long: Become independent at using PLB.             Initial Exercise Prescription:  Initial Exercise Prescription - 07/08/21 1300       Date of Initial Exercise RX and Referring Provider   Date 07/08/21    Referring Provider Serafina Royals MD      Oxygen   Maintain Oxygen  Saturation 88% or higher      Recumbant Bike   Level 1    RPM 60    Watts 15    Minutes 15    METs 1.1      NuStep   Level 1    SPM 80    Minutes 15    METs 1.1      Track   Laps 16    Minutes 15    METs 1.87      Prescription Details   Frequency (times per week) 2    Duration Progress to 30 minutes of continuous aerobic without signs/symptoms of physical distress      Intensity   THRR 40-80% of Max Heartrate 106-134    Ratings of Perceived Exertion 11-13    Perceived Dyspnea 0-4      Progression   Progression Continue to progress workloads to maintain intensity without signs/symptoms of physical distress.      Resistance Training   Training Prescription Yes    Weight 3 lb    Reps 10-15             Perform Capillary Blood Glucose checks as needed.  Exercise Prescription Changes:   Exercise Prescription Changes     Row Name 07/08/21 1300 07/26/21 1400           Response to Exercise   Blood Pressure (Admit) 104/64 120/64      Blood Pressure (Exercise) 112/66 120/66      Blood Pressure (Exit) 102/66 104/64      Heart Rate (Admit) 79 bpm 91 bpm      Heart Rate (Exercise) 105 bpm 100 bpm      Heart Rate (Exit) 85 bpm 89 bpm      Oxygen Saturation (Admit) 97 % 97 %      Oxygen Saturation (Exercise) 96 % 95 %      Oxygen Saturation (Exit) 98 % 96 %      Rating of Perceived Exertion (Exercise) 12 11      Perceived Dyspnea (Exercise) 2 --      Symptoms Unsteady on feet --      Comments walk test results third day  Duration -- Progress to 30 minutes of  aerobic without signs/symptoms of physical distress      Intensity -- THRR unchanged        Progression   Progression -- Continue to progress workloads to maintain intensity without signs/symptoms of physical distress.      Average METs -- 1.85        Resistance Training   Training Prescription -- Yes      Weight -- 3 lb      Reps -- 10-15        NuStep   Level -- 1      SPM -- 80       Minutes -- 15        Track   Laps -- 5      Minutes -- 15      METs -- 1.27               Exercise Comments:   Exercise Goals and Review:   Exercise Goals     Row Name 07/08/21 1358             Exercise Goals   Increase Physical Activity Yes       Intervention Provide advice, education, support and counseling about physical activity/exercise needs.;Develop an individualized exercise prescription for aerobic and resistive training based on initial evaluation findings, risk stratification, comorbidities and participant's personal goals.       Expected Outcomes Short Term: Attend rehab on a regular basis to increase amount of physical activity.;Long Term: Add in home exercise to make exercise part of routine and to increase amount of physical activity.;Long Term: Exercising regularly at least 3-5 days a week.       Increase Strength and Stamina Yes       Intervention Provide advice, education, support and counseling about physical activity/exercise needs.;Develop an individualized exercise prescription for aerobic and resistive training based on initial evaluation findings, risk stratification, comorbidities and participant's personal goals.       Expected Outcomes Short Term: Increase workloads from initial exercise prescription for resistance, speed, and METs.;Short Term: Perform resistance training exercises routinely during rehab and add in resistance training at home;Long Term: Improve cardiorespiratory fitness, muscular endurance and strength as measured by increased METs and functional capacity (6MWT)       Able to understand and use rate of perceived exertion (RPE) scale Yes       Intervention Provide education and explanation on how to use RPE scale       Expected Outcomes Short Term: Able to use RPE daily in rehab to express subjective intensity level;Long Term:  Able to use RPE to guide intensity level when exercising independently       Able to understand and use Dyspnea  scale Yes       Intervention Provide education and explanation on how to use Dyspnea scale       Expected Outcomes Short Term: Able to use Dyspnea scale daily in rehab to express subjective sense of shortness of breath during exertion;Long Term: Able to use Dyspnea scale to guide intensity level when exercising independently       Knowledge and understanding of Target Heart Rate Range (THRR) Yes       Intervention Provide education and explanation of THRR including how the numbers were predicted and where they are located for reference       Expected Outcomes Short Term: Able to state/look up THRR;Long Term: Able to use THRR to govern intensity when exercising  independently;Short Term: Able to use daily as guideline for intensity in rehab       Able to check pulse independently Yes       Intervention Provide education and demonstration on how to check pulse in carotid and radial arteries.;Review the importance of being able to check your own pulse for safety during independent exercise       Expected Outcomes Short Term: Able to explain why pulse checking is important during independent exercise;Long Term: Able to check pulse independently and accurately       Understanding of Exercise Prescription Yes       Intervention Provide education, explanation, and written materials on patient's individual exercise prescription       Expected Outcomes Short Term: Able to explain program exercise prescription;Long Term: Able to explain home exercise prescription to exercise independently                Exercise Goals Re-Evaluation :  Exercise Goals Re-Evaluation     [place] Name 07/14/21 1538 07/26/21 1501           Exercise Goal Re-Evaluation   Exercise Goals Review Increase Physical Activity;Able to understand and use rate of perceived exertion (RPE) scale;Knowledge and understanding of Target Heart Rate Range (THRR);Understanding of Exercise Prescription;Increase Strength and Stamina;Able to check  pulse independently;Able to understand and use Dyspnea scale Increase Physical Activity;Increase Strength and Stamina      Comments Reviewed RPE and dyspnea scales, THR and program prescription with pt today.  Pt voiced understanding and was given a copy of goals to take home. Michella has tolerated exercise well in her first sessions. Oxygen has stayed in mid 90s during all sessions.      Expected Outcomes Short: Use RPE daily to regulate intensity. Long: Follow program prescription in THR. Short: attend consistently Long: build overall stamina               Discharge Exercise Prescription (Final Exercise Prescription Changes):  Exercise Prescription Changes - 07/26/21 1400       Response to Exercise   Blood Pressure (Admit) 120/64    Blood Pressure (Exercise) 120/66    Blood Pressure (Exit) 104/64    Heart Rate (Admit) 91 bpm    Heart Rate (Exercise) 100 bpm    Heart Rate (Exit) 89 bpm    Oxygen Saturation (Admit) 97 %    Oxygen Saturation (Exercise) 95 %    Oxygen Saturation (Exit) 96 %    Rating of Perceived Exertion (Exercise) 11    Comments third day    Duration Progress to 30 minutes of  aerobic without signs/symptoms of physical distress    Intensity THRR unchanged      Progression   Progression Continue to progress workloads to maintain intensity without signs/symptoms of physical distress.    Average METs 1.85      Resistance Training   Training Prescription Yes    Weight 3 lb    Reps 10-15      NuStep   Level 1    SPM 80    Minutes 15      Track   Laps 5    Minutes 15    METs 1.27             Nutrition:  Target Goals: Understanding of nutrition guidelines, daily intake of sodium 1500mg , cholesterol 200mg , calories 30% from fat and 7% or less from saturated fats, daily to have 5 or more servings of fruits and vegetables.  Education: All  About Nutrition: -Group instruction provided by verbal, written material, interactive activities, discussions,  models, and posters to present general guidelines for heart healthy nutrition including fat, fiber, MyPlate, the role of sodium in heart healthy nutrition, utilization of the nutrition label, and utilization of this knowledge for meal planning. Follow up email sent as well. Written material given at graduation. Flowsheet Row Pulmonary Rehab from 07/21/2021 in [facility] Cardiac and Pulmonary Rehab  Education need identified 07/08/21 (P)        Biometrics:  Pre Biometrics - 07/08/21 1122       Pre Biometrics   Height 5' 2.25" (1.581 m)    Weight 227 lb 14.4 oz (103.4 kg)    BMI (Calculated) 41.36    Single Leg Stand 8.94 seconds              Nutrition Therapy Plan and Nutrition Goals:   Nutrition Assessments:  MEDIFICTS Score Key: ?70 Need to make dietary changes  40-70 Heart Healthy Diet ? 40 Therapeutic Level Cholesterol Diet  Flowsheet Row Pulmonary Rehab from 07/08/2021 in [facility] Cardiac and Pulmonary Rehab  Picture Your Plate Total Score on Admission 65      Picture Your Plate Scores: <44 Unhealthy dietary pattern with much room for improvement. 41-50 Dietary pattern unlikely to meet recommendations for good health and room for improvement. 51-60 More healthful dietary pattern, with some room for improvement.  >60 Healthy dietary pattern, although there may be some specific behaviors that could be improved.   Nutrition Goals Re-Evaluation:   Nutrition Goals Discharge (Final Nutrition Goals Re-Evaluation):   Psychosocial: Target Goals: Acknowledge presence or absence of significant depression and/or stress, maximize coping skills, provide positive support system. Participant is able to verbalize types and ability to use techniques and skills needed for reducing stress and depression.   Education: Stress, Anxiety, and Depression - Group verbal and visual presentation to define topics covered.  Reviews how body is impacted by stress, anxiety, and depression.  Also  discusses healthy ways to reduce stress and to treat/manage anxiety and depression.  Written material given at graduation.   Education: Sleep Hygiene -Provides group verbal and written instruction about how sleep can affect your health.  Define sleep hygiene, discuss sleep cycles and impact of sleep habits. Review good sleep hygiene tips.    Initial Review & Psychosocial Screening:  Initial Psych Review & Screening - 07/05/21 1413       Initial Review   Current issues with Current Sleep Concerns;Current Stress Concerns    Source of Stress Concerns Chronic Illness;Unable to participate in former interests or hobbies;Unable to perform yard/household activities      [place]? No    Concerns No support system      Barriers   Psychosocial barriers to participate in program There are no identifiable barriers or psychosocial needs.      Screening Interventions   Interventions Encouraged to exercise;Provide feedback about the scores to participant;To provide support and resources with identified psychosocial needs    Expected Outcomes Short Term goal: Utilizing psychosocial counselor, staff and physician to assist with identification of specific Stressors or current issues interfering with healing process. Setting desired goal for each stressor or current issue identified.;Long Term Goal: Stressors or current issues are controlled or eliminated.;Short Term goal: Identification and review with participant of any Quality of Life or Depression concerns found by scoring the questionnaire.;Long Term goal: The participant improves quality of Life and PHQ9 Scores as seen by post  scores and/or verbalization of changes             Quality of Life Scores:  Scores of 19 and below usually indicate a poorer quality of life in these areas.  A difference of  2-3 points is a clinically meaningful difference.  A difference of 2-3 points in the total score of the Quality of Life  Index has been associated with significant improvement in overall quality of life, self-image, physical symptoms, and general health in studies assessing change in quality of life.  PHQ-9: Recent Review Flowsheet Data     Depression screen [facility] 2/9 07/08/2021 03/03/2021   Decreased Interest 1 0   Down, Depressed, Hopeless 2 1   PHQ - 2 Score 3 1   Altered sleeping 3 -   Tired, decreased energy 3 -   Change in appetite 2 -   Feeling bad or failure about yourself  1 -   Trouble concentrating 1 -   Moving slowly or fidgety/restless 1 -   Suicidal thoughts 0 -   PHQ-9 Score 14 -   Difficult doing work/chores Somewhat difficult -      Interpretation of Total Score  Total Score Depression Severity:  1-4 = Minimal depression, 5-9 = Mild depression, 10-14 = Moderate depression, 15-19 = Moderately severe depression, 20-27 = Severe depression   Psychosocial Evaluation and Intervention:  Psychosocial Evaluation - 07/05/21 1424       Psychosocial Evaluation & Interventions   Interventions Stress management education;Encouraged to exercise with the program and follow exercise prescription    Comments Carmaleta reports her heart failure symptoms have been getting in the way of her daily life for the last 5 years. She feels like she can't participate in her hobbies or take her dogs places like she used to. Her mother passed away in the last two years and her only brother that is close by doesn't speak to her. [place] with [place] dogs, so she states she doesn't have a real support system. She was exercising regularly trying to lose weight but her heart failure symptoms have gotten in the way and now she is up to a weight she has never been before and is hating it. She wants to feel better and increase her stamina as well as lose weight. She does have some vestibular issues post the second Covid vaccine that also causes some balance issues.She takes medication to sleep but  also takes a diuretic so she feels like she is constantly in the bathroom. She does have a copay so she may not be able to complete the total number of sessions, but she is very motivated to do whatever it takes to gain some control back in her life.    Expected Outcomes Short; attend Pulmonary Rehab for exercise and education. Long; develop and maintain positive self care habits.    Continue Psychosocial Services  Follow up required by staff             Psychosocial Re-Evaluation:   Psychosocial Discharge (Final Psychosocial Re-Evaluation):   Education: Education Goals: Education classes will be provided on a weekly basis, covering required topics. Participant will state understanding/return demonstration of topics presented.  Learning Barriers/Preferences:  Learning Barriers/Preferences - 07/05/21 1413       Learning Barriers/Preferences   Learning Barriers None    Learning Preferences None             General Pulmonary Education Topics:  Infection Prevention: -  Provides verbal and written material to individual with discussion of infection control including proper hand washing and proper equipment cleaning during exercise session. Flowsheet Row Pulmonary Rehab from 07/21/2021 in [facility] Cardiac and Pulmonary Rehab  Education need identified 07/08/21 (P)   Date 07/08/21 (P)   Educator KL (P)   Instruction Review Code 1- Verbalizes Understanding (P)        Falls Prevention: - Provides verbal and written material to individual with discussion of falls prevention and safety. Flowsheet Row Pulmonary Rehab from 07/21/2021 in [facility] Cardiac and Pulmonary Rehab  Education need identified 07/08/21 (P)   Date 07/08/21 (P)   Educator KL (P)   Instruction Review Code 1- Verbalizes Understanding (P)        Chronic Lung Disease Review: - Group verbal instruction with posters, models, PowerPoint presentations and videos,  to review new updates, new respiratory medications,  new advancements in procedures and treatments. Providing information on websites and "800" numbers for continued self-education. Includes information about supplement oxygen, available portable oxygen systems, continuous and intermittent flow rates, oxygen safety, concentrators, and Medicare reimbursement for oxygen. Explanation of Pulmonary Drugs, including class, frequency, complications, importance of spacers, rinsing mouth after steroid MDI's, and proper cleaning methods for nebulizers. Review of basic lung anatomy and physiology related to function, structure, and complications of lung disease. Review of risk factors. Discussion about methods for diagnosing sleep apnea and types of masks and machines for OSA. Includes a review of the use of types of environmental controls: home humidity, furnaces, filters, dust mite/pet prevention, HEPA vacuums. Discussion about weather changes, air quality and the benefits of nasal washing. Instruction on Warning signs, infection symptoms, calling MD promptly, preventive modes, and value of vaccinations. Review of effective airway clearance, coughing and/or vibration techniques. Emphasizing that all should Create an Action Plan. Written material given at graduation.   AED/CPR: - Group verbal and written instruction with the use of models to demonstrate the basic use of the AED with the basic ABC's of resuscitation.    Anatomy and Cardiac Procedures: - Group verbal and visual presentation and models provide information about basic cardiac anatomy and function. Reviews the testing methods done to diagnose heart disease and the outcomes of the test results. Describes the treatment choices: Medical Management, Angioplasty, or Coronary Bypass Surgery for treating various heart conditions including Myocardial Infarction, Angina, Valve Disease, and Cardiac Arrhythmias.  Written material given at graduation.   Medication Safety: - Group verbal and visual instruction to  review commonly prescribed medications for heart and lung disease. Reviews the medication, class of the drug, and side effects. Includes the steps to properly store meds and maintain the prescription regimen.  Written material given at graduation. Flowsheet Row Pulmonary Rehab from 07/21/2021 in [facility] Cardiac and Pulmonary Rehab  Date 07/21/21 (P)   Educator mc (P)   Instruction Review Code 1- Verbalizes Understanding (P)        Other: -Provides group and verbal instruction on various topics (see comments)   Knowledge Questionnaire Score:  Knowledge Questionnaire Score - 07/08/21 1120       Knowledge Questionnaire Score   Pre Score 23/26 (cardiac): Diabetes, Nutrition, Aerobic              Core Components/Risk Factors/Patient Goals at Admission:  Personal Goals and Risk Factors at Admission - 07/08/21 1358       Core Components/Risk Factors/Patient Goals on Admission    Weight Management Yes;Weight Loss    Intervention Weight Management: Develop a combined nutrition  and exercise program designed to reach desired caloric intake, while maintaining appropriate intake of nutrient and fiber, sodium and fats, and appropriate energy expenditure required for the weight goal.;Weight Management: Provide education and appropriate resources to help participant work on and attain dietary goals.;Obesity: Provide education and appropriate resources to help participant work on and attain dietary goals.    Admit Weight 227 lb (103 kg)    Goal Weight: Short Term 222 lb (100.7 kg)    Goal Weight: Long Term 150 lb (68 kg)    Expected Outcomes Short Term: Continue to assess and modify interventions until short term weight is achieved;Long Term: Adherence to nutrition and physical activity/exercise program aimed toward attainment of established weight goal;Weight Loss: Understanding of general recommendations for a balanced deficit meal plan, which promotes 1-2 lb weight loss per week and includes a  negative energy balance of [phone] kcal/d;Understanding recommendations for meals to include 15-35% energy as protein, 25-35% energy from fat, 35-60% energy from carbohydrates, less than 200mg  of dietary cholesterol, 20-35 gm of total fiber daily;Understanding of distribution of calorie intake throughout the day with the consumption of 4-5 meals/snacks    Heart Failure Yes    Intervention Provide a combined exercise and nutrition program that is supplemented with education, support and counseling about heart failure. Directed toward relieving symptoms such as shortness of breath, decreased exercise tolerance, and extremity edema.    Expected Outcomes Short term: Attendance in program 2-3 days a week with increased exercise capacity. Reported lower sodium intake. Reported increased fruit and vegetable intake. Reports medication compliance.;Improve functional capacity of life;Short term: Daily weights obtained and reported for increase. Utilizing diuretic protocols set by physician.;Long term: Adoption of self-care skills and reduction of barriers for early signs and symptoms recognition and intervention leading to self-care maintenance.    Hypertension Yes    Intervention Provide education on lifestyle modifcations including regular physical activity/exercise, weight management, moderate sodium restriction and increased consumption of fresh fruit, vegetables, and low fat dairy, alcohol moderation, and smoking cessation.;Monitor prescription use compliance.    Expected Outcomes Short Term: Continued assessment and intervention until BP is < 140/3mm HG in hypertensive participants. < 130/48mm HG in hypertensive participants with diabetes, heart failure or chronic kidney disease.;Long Term: Maintenance of blood pressure at goal levels.    Lipids Yes    Intervention Provide education and support for participant on nutrition & aerobic/resistive exercise along with prescribed medications to achieve LDL 70mg , HDL  >40mg .    Expected Outcomes Short Term: Participant states understanding of desired cholesterol values and is compliant with medications prescribed. Participant is following exercise prescription and nutrition guidelines.;Long Term: Cholesterol controlled with medications as prescribed, with individualized exercise RX and with personalized nutrition plan. Value goals: LDL < 70mg , HDL > 40 mg.             Education:Diabetes - Individual verbal and written instruction to review signs/symptoms of diabetes, desired ranges of glucose level fasting, after meals and with exercise. Acknowledge that pre and post exercise glucose checks will be done for 3 sessions at entry of program.   Know Your Numbers and Heart Failure: - Group verbal and visual instruction to discuss disease risk factors for cardiac and pulmonary disease and treatment options.  Reviews associated critical values for Overweight/Obesity, Hypertension, Cholesterol, and Diabetes.  Discusses basics of heart failure: signs/symptoms and treatments.  Introduces Heart Failure Zone chart for action plan for heart failure.  Written material given at graduation. Flowsheet Row Pulmonary Rehab from 07/21/2021  in [facility] Cardiac and Pulmonary Rehab  Education need identified 07/08/21 (P)        Core Components/Risk Factors/Patient Goals Review:    Core Components/Risk Factors/Patient Goals at Discharge (Final Review):    ITP Comments:  ITP Comments     Row Name 07/05/21 1434 07/08/21 1121 07/14/21 1534 07/28/21 0724     ITP Comments Initial telephone orientation completed. Diagnosis can be found in [facility] 8/24. EP orientation scheduled for Thursday 11/10 at 9:30. Completed 6MWT and gym orientation. Initial ITP created and sent for review to Dr. Ottie Glazier, Medical Director. First full day of exercise!  Patient was oriented to gym and equipment including functions, settings, policies, and procedures.  Patient's individual exercise  prescription and treatment plan were reviewed.  All starting workloads were established based on the results of the 6 minute walk test done at initial orientation visit.  The plan for exercise progression was also introduced and progression will be customized based on patient's performance and goals. 30 Day review completed. Medical Director ITP review done, changes made as directed, and signed approval by Medical Director.   New to program             Comments:

## 2021-07-28 NOTE — Patient Instructions (Addendum)
Continue weighing daily and call for an overnight weight gain of 3 pounds or more or a weekly weight gain of more than 5 pounds.    Stop taking furosemide   Begin taking torsemide 1/2 tablet (50mg ) once a day

## 2021-07-29 ENCOUNTER — Encounter: Payer: Medicare HMO | Attending: Internal Medicine

## 2021-07-29 ENCOUNTER — Other Ambulatory Visit: Payer: Self-pay

## 2021-07-29 DIAGNOSIS — I5022 Chronic systolic (congestive) heart failure: Secondary | ICD-10-CM | POA: Insufficient documentation

## 2021-07-29 NOTE — Progress Notes (Signed)
Completed initial RD consultation ?

## 2021-08-04 ENCOUNTER — Other Ambulatory Visit: Payer: Self-pay

## 2021-08-04 DIAGNOSIS — I5022 Chronic systolic (congestive) heart failure: Secondary | ICD-10-CM

## 2021-08-04 NOTE — Progress Notes (Signed)
Daily Session Note  Patient Details  Name: Mandy Jones MRN: [id number] Date of Birth: March 10, 1951 Referring Provider:   Flowsheet Row Pulmonary Rehab from 07/08/2021 in [facility] Cardiac and Pulmonary Rehab  Referring Provider Serafina Royals MD       Encounter Date: 08/04/2021  Check In:  Session Check In - 08/04/21 1556       Check-In   Supervising physician immediately available to respond to emergencies See telemetry face sheet for immediately available ER MD    Location [facility]-Cardiac & Pulmonary Rehab    Staff Present Birdie Sons, MPA, Nino Glow, MS, ASCM CEP, Exercise Physiologist;Melissa Caiola, RDN, LDN    Virtual Visit No    Medication changes reported     No    Fall or balance concerns reported    No    Tobacco Cessation No Change    Warm-up and Cool-down Performed on first and last piece of equipment    Resistance Training Performed Yes    VAD Patient? No    PAD/SET Patient? No      Pain Assessment   Currently in Pain? No/denies                Social History   Tobacco Use  Smoking Status Former  Smokeless Tobacco Never  Tobacco Comments   quit 30 years ago    Goals Met:  Independence with exercise equipment Exercise tolerated well No report of concerns or symptoms today Strength training completed today  Goals Unmet:  Not Applicable  Comments: Pt able to follow exercise prescription today without complaint.  Will continue to monitor for progression.    Dr. Emily Filbert is Medical Director for [place].  Dr. Ottie Glazier is Medical Director for [facility] Pulmonary Rehabilitation.

## 2021-08-10 ENCOUNTER — Other Ambulatory Visit: Payer: Self-pay

## 2021-08-10 DIAGNOSIS — I5022 Chronic systolic (congestive) heart failure: Secondary | ICD-10-CM

## 2021-08-10 NOTE — Progress Notes (Addendum)
Daily Session Note  Patient Details  Name: Mandy Jones MRN: [id number] Date of Birth: [birth year] Referring Provider:   Flowsheet Row Pulmonary Rehab from 07/08/2021 in [facility] Cardiac and Pulmonary Rehab  Referring Provider Serafina Royals MD       Encounter Date: 08/10/2021  Check In:  Session Check In - 08/10/21 1116       Check-In   Supervising physician immediately available to respond to emergencies See telemetry face sheet for immediately available ER MD    Location [facility]-Cardiac & Pulmonary Rehab    Staff Present Birdie Sons, MPA, RN;Melissa McGrath, RDN, LDN;Jessica Hostetter, MA, RCEP, CCRP, CCET;Amanda Sommer, BA, ACSM CEP, Exercise Physiologist;Kara Eliezer Bottom, MS, ASCM CEP, Exercise Physiologist    Virtual Visit No    Medication changes reported     Yes    Comments added lasix    Fall or balance concerns reported    No    Tobacco Cessation No Change    Warm-up and Cool-down Performed on first and last piece of equipment    Resistance Training Performed Yes    VAD Patient? No    PAD/SET Patient? No      Pain Assessment   Currently in Pain? No/denies                Social History   Tobacco Use  Smoking Status Former  Smokeless Tobacco Never  Tobacco Comments   quit 30 years ago    Goals Met:  Independence with exercise equipment Exercise tolerated well Personal goals reviewed No report of concerns or symptoms today Strength training completed today  Goals Unmet:  Not Applicable  Comments: Pt able to follow exercise prescription today without complaint.  Will continue to monitor for progression.    Dr. Emily Filbert is Medical Director for [place].  Dr. Ottie Glazier is Medical Director for [facility] Pulmonary Rehabilitation.

## 2021-08-17 DIAGNOSIS — I5022 Chronic systolic (congestive) heart failure: Secondary | ICD-10-CM

## 2021-08-17 NOTE — Progress Notes (Signed)
Mandy Jones has missed some sessions and is travelling for the holiday.  Staff were not able to speak with her about goals.

## 2021-08-25 ENCOUNTER — Encounter: Payer: Self-pay | Admitting: *Deleted

## 2021-08-25 DIAGNOSIS — I5022 Chronic systolic (congestive) heart failure: Secondary | ICD-10-CM

## 2021-08-25 NOTE — Progress Notes (Signed)
Pulmonary Individual Treatment Plan  Patient Details  Name: Mandy Jones MRN: [id number] Date of Birth: 10/12/1950 Referring Provider:   Flowsheet Row Pulmonary Rehab from 07/08/2021 in [facility] Card[facility] Pulmonary Rehab  Referring Provider Serafina Royals MD       Initial Encounter Date:  Flowsheet Row Pulmonary Rehab from 07/08/2021 in [facility] Cardiac and Pulmonary Rehab  Date 07/08/21       Visit Diagnosis: Heart failure, chronic systolic ([place])  Patient's Home Medications on Admission:  Current Outpatient Medications:    apixaban (ELIQUIS) 5 MG TABS tablet, Take 5 mg by mouth every 12 (twelve) hours., Disp: 60 tablet, Rfl: 0   Ascorbic Acid (VITAMIN C) 1000 MG tablet, Take 1,000 mg by mouth every evening., Disp: , Rfl:    Ashwagandha 500 MG CAPS, Take 1,000 mg by mouth daily., Disp: , Rfl:    B Complex-C (SUPER B COMPLEX PO), Take 1 tablet by mouth daily., Disp: , Rfl:    Boswellia-Glucosamine-Vit D (OSTEO BI-FLEX ONE PER DAY) TABS, Take 1 tablet by mouth daily., Disp: , Rfl:    Calcium-Magnesium-Vitamin D (CALCIUM MAGNESIUM PO), Take 1 tablet by mouth daily. (Patient not taking: Reported on 07/28/2021), Disp: , Rfl:    Cholecalciferol (VITAMIN D3) 50 MCG (2000 UT) TABS, Take 2,000 Units by mouth daily., Disp: , Rfl:    Coenzyme Q10 (CO Q 10) 100 MG CAPS, Take 100 mg by mouth daily., Disp: , Rfl:    famotidine (PEPCID) 20 MG tablet, Take 40 mg by mouth at bedtime., Disp: , Rfl:    levothyroxine (SYNTHROID) 150 MCG tablet, Take 150 mcg by mouth daily before breakfast., Disp: , Rfl:    melatonin 5 MG TABS, Take 5 mg by mouth at bedtime as needed (sleep)., Disp: , Rfl:    Multiple Vitamins-Minerals (PRESERVISION AREDS 2 PO), Take 1 tablet by mouth daily., Disp: , Rfl:    potassium chloride (KLOR-CON) 10 MEQ tablet, Take 20 mEq by mouth daily., Disp: , Rfl:    Red Yeast Rice Extract 600 MG CAPS, Take 600 mg by mouth every evening., Disp: , Rfl:    sacubitril-valsartan  (ENTRESTO) 24-26 MG, Take 1 tablet by mouth 2 (two) times daily., Disp: 60 tablet, Rfl: 5   spironolactone (ALDACTONE) 25 MG tablet, Take 1 tablet (25 mg total) by mouth daily., Disp: 30 tablet, Rfl: 5   torsemide (DEMADEX) 100 MG tablet, Take 0.5 tablets (50 mg total) by mouth daily., Disp: 30 tablet, Rfl: 3   traZODone (DESYREL) 50 MG tablet, Take 50-150 mg by mouth at bedtime as needed for sleep., Disp: , Rfl: 1   valACYclovir (VALTREX) 1000 MG tablet, Take 2,000 mg by mouth daily as needed (For breakout)., Disp: , Rfl:    XIIDRA 5 % SOLN, Apply 1 drop to eye 2 (two) times daily., Disp: , Rfl:   Past Medical History: Past Medical History:  Diagnosis Date   Arrhythmia    atrial fibrillation   Arthritis    CHF (congestive heart failure) (HCC)    Depression    Dyspnea    Dysrhythmia    Fibromyalgia 1992   GERD (gastroesophageal reflux disease)    Hepatitis    Hepatitis B 1985   Hyperlipidemia    Hypertension    Mitral valve prolapse    Pacemaker    Pacemaker 01/13/2021   Sleep apnea    CPAP   Tendon tear, ankle    rt foot   Thyroid dysfunction     Tobacco Use: Social History  Tobacco Use  Smoking Status Former  Smokeless Tobacco Never  Tobacco Comments   quit 30 years ago    Labs: Recent Review Flowsheet Data   There is no flowsheet data to display.      Pulmonary Assessment Scores:  Pulmonary Assessment Scores     Row Name 07/08/21 1125         ADL UCSD   ADL Phase Entry     SOB Score total 63     Rest 4     Walk 5     Stairs 5     Bath 1     Dress 1     Shop 2       CAT Score   CAT Score 17       mMRC Score   mMRC Score 3              UCSD: Self-administered rating of dyspnea associated with activities of daily living (ADLs) 6-point scale (0 = "not at all" to 5 = "maximal or unable to do because of breathlessness")  Scoring Scores range from 0 to 120.  Minimally important difference is 5 units  CAT: CAT can identify the health  impairment of COPD patients and is better correlated with disease progression.  CAT has a scoring range of zero to 40. The CAT score is classified into four groups of low (less than 10), medium (10 - 20), high (21-30) and very high (31-40) based on the impact level of disease on health status. A CAT score over 10 suggests significant symptoms.  A worsening CAT score could be explained by an exacerbation, poor medication adherence, poor inhaler technique, or progression of COPD or comorbid conditions.  CAT MCID is 2 points  mMRC: mMRC (Modified Medical Research Council) Dyspnea Scale is used to assess the degree of baseline functional disability in patients of respiratory disease due to dyspnea. No minimal important difference is established. A decrease in score of 1 point or greater is considered a positive change.   Pulmonary Function Assessment:   Exercise Target Goals: Exercise Program Goal: Individual exercise prescription set using results from initial 6 min walk test and THRR while considering  patients activity barriers and safety.   Exercise Prescription Goal: Initial exercise prescription builds to 30-45 minutes a day of aerobic activity, 2-3 days per week.  Home exercise guidelines will be given to patient during program as part of exercise prescription that the participant will acknowledge.  Education: Aerobic Exercise: - Group verbal and visual presentation on the components of exercise prescription. Introduces F.I.T.T principle from ACSM for exercise prescriptions.  Reviews F.I.T.T. principles of aerobic exercise including progression. Written material given at graduation. Flowsheet Row Pulmonary Rehab from 08/04/2021 in [facility] Cardiac and Pulmonary Rehab  Education need identified 07/08/21  Date 07/14/21  Educator [place]  Instruction Review Code 1- [employer] Understanding       Education: Resistance Exercise: - Group verbal and visual presentation on the components of exercise  prescription. Introduces F.I.T.T principle from ACSM for exercise prescriptions  Reviews F.I.T.T. principles of resistance exercise including progression. Written material given at graduation.    Education: Exercise & Equipment Safety: - Individual verbal instruction and demonstration of equipment use and safety with use of the equipment. Flowsheet Row Pulmonary Rehab from 08/04/2021 in [facility] Cardiac and Pulmonary Rehab  Education need identified 07/08/21  Date 07/08/21  Educator [place]  Instruction Review Code 1- Verbalizes Understanding       Education: Exercise Physiology & General  Exercise Guidelines: - Group verbal and written instruction with models to review the exercise physiology of the cardiovascular system and associated critical values. Provides general exercise guidelines with specific guidelines to those with heart or lung disease.    Education: Flexibility, Balance, Mind/Body Relaxation: - Group verbal and visual presentation with interactive activity on the components of exercise prescription. Introduces F.I.T.T principle from ACSM for exercise prescriptions. Reviews F.I.T.T. principles of flexibility and balance exercise training including progression. Also discusses the mind body connection.  Reviews various relaxation techniques to help reduce and manage stress (i.e. Deep breathing, progressive muscle relaxation, and visualization). Balance handout provided to take home. Written material given at graduation.   Activity Barriers & Risk Stratification:  Activity Barriers & Cardiac Risk Stratification - 07/08/21 1121       Activity Barriers & Cardiac Risk Stratification   Activity Barriers Left Knee Replacement;Right Knee Replacement;Balance Concerns;Arthritis;Shortness of Breath;Assistive Device;Deconditioning;Muscular Weakness;Other (comment)    Comments Bursitis in left shoulder             6 Minute Walk:  6 Minute Walk     Row Name 07/08/21 1130         6  Minute Walk   Phase Initial     Distance 875 feet     Walk Time 6 minutes     # of Rest Breaks 0     MPH 1.65     METS 1.16     RPE 12     Perceived Dyspnea  2     VO2 Peak 4.07     Symptoms Yes (comment)     Comments Felt unsteady on feet     Resting HR 79 bpm     Resting BP 104/64     Resting Oxygen Saturation  97 %     Exercise Oxygen Saturation  during 6 min walk 96 %     Max Ex. HR 105 bpm     Max Ex. BP 112/66     2 Minute Post BP 102/66       Interval HR   1 Minute HR 99     2 Minute HR 102     3 Minute HR 105     4 Minute HR 102     5 Minute HR 103     6 Minute HR 101     2 Minute Post HR 87     Interval Heart Rate? Yes       Interval Oxygen   Interval Oxygen? Yes     Baseline Oxygen Saturation % 97 %     1 Minute Oxygen Saturation % 96 %     1 Minute Liters of Oxygen 0 L  RA     2 Minute Oxygen Saturation % 97 %     2 Minute Liters of Oxygen 0 L     3 Minute Oxygen Saturation % 96 %     3 Minute Liters of Oxygen 0 L     4 Minute Oxygen Saturation % 96 %     4 Minute Liters of Oxygen 0 L     5 Minute Oxygen Saturation % 97 %     5 Minute Liters of Oxygen 0 L     6 Minute Oxygen Saturation % 96 %     6 Minute Liters of Oxygen 0 L     2 Minute Post Oxygen Saturation % 98 %     2 Minute Post Liters of Oxygen 0 L  Oxygen Initial Assessment:  Oxygen Initial Assessment - 07/08/21 1124       Home Oxygen   Home Oxygen Device None    Sleep Oxygen Prescription CPAP    Home Exercise Oxygen Prescription None    Home Resting Oxygen Prescription None    Compliance with Home Oxygen Use Yes      Initial 6 min Walk   Oxygen Used None      Program Oxygen Prescription   Program Oxygen Prescription None      Intervention   Short Term Goals To learn and exhibit compliance with exercise, home and travel O2 prescription;To learn and understand importance of monitoring SPO2 with pulse oximeter and demonstrate accurate use of the pulse oximeter.;To  learn and understand importance of maintaining oxygen saturations>88%;To learn and demonstrate proper pursed lip breathing techniques or other breathing techniques. ;To learn and demonstrate proper use of respiratory medications    Long  Term Goals Exhibits compliance with exercise, home  and travel O2 prescription;Maintenance of O2 saturations>88%;Exhibits proper breathing techniques, such as pursed lip breathing or other method taught during program session;Verbalizes importance of monitoring SPO2 with pulse oximeter and return demonstration;Compliance with respiratory medication             Oxygen Re-Evaluation:  Oxygen Re-Evaluation     Row Name 07/14/21 1535             Program Oxygen Prescription   Program Oxygen Prescription None         Home Oxygen   Home Oxygen Device None       Sleep Oxygen Prescription CPAP       Home Exercise Oxygen Prescription None       Home Resting Oxygen Prescription None       Compliance with Home Oxygen Use Yes         Goals/Expected Outcomes   Short Term Goals To learn and exhibit compliance with exercise, home and travel O2 prescription;To learn and understand importance of monitoring SPO2 with pulse oximeter and demonstrate accurate use of the pulse oximeter.;To learn and understand importance of maintaining oxygen saturations>88%;To learn and demonstrate proper pursed lip breathing techniques or other breathing techniques. ;To learn and demonstrate proper use of respiratory medications       Long  Term Goals Exhibits compliance with exercise, home  and travel O2 prescription;Maintenance of O2 saturations>88%;Exhibits proper breathing techniques, such as pursed lip breathing or other method taught during program session;Verbalizes importance of monitoring SPO2 with pulse oximeter and return demonstration;Compliance with respiratory medication       Comments Reviewed PLB technique with pt.  Talked about how it works and it's importance in  maintaining their exercise saturations.       Goals/Expected Outcomes Short: Become more profiecient at using PLB.   Long: Become independent at using PLB.                Oxygen Discharge (Final Oxygen Re-Evaluation):  Oxygen Re-Evaluation - 07/14/21 1535       Program Oxygen Prescription   Program Oxygen Prescription None      Home Oxygen   Home Oxygen Device None    Sleep Oxygen Prescription CPAP    Home Exercise Oxygen Prescription None    Home Resting Oxygen Prescription None    Compliance with Home Oxygen Use Yes      Goals/Expected Outcomes   Short Term Goals To learn and exhibit compliance with exercise, home and travel O2 prescription;To learn and understand importance of monitoring  SPO2 with pulse oximeter and demonstrate accurate use of the pulse oximeter.;To learn and understand importance of maintaining oxygen saturations>88%;To learn and demonstrate proper pursed lip breathing techniques or other breathing techniques. ;To learn and demonstrate proper use of respiratory medications    Long  Term Goals Exhibits compliance with exercise, home  and travel O2 prescription;Maintenance of O2 saturations>88%;Exhibits proper breathing techniques, such as pursed lip breathing or other method taught during program session;Verbalizes importance of monitoring SPO2 with pulse oximeter and return demonstration;Compliance with respiratory medication    Comments Reviewed PLB technique with pt.  Talked about how it works and it's importance in maintaining their exercise saturations.    Goals/Expected Outcomes Short: Become more profiecient at using PLB.   Long: Become independent at using PLB.             Initial Exercise Prescription:  Initial Exercise Prescription - 07/08/21 1300       Date of Initial Exercise RX and Referring Provider   Date 07/08/21    Referring Provider Serafina Royals MD      Oxygen   Maintain Oxygen Saturation 88% or higher      Recumbant Bike   Level  1    RPM 60    Watts 15    Minutes 15    METs 1.1      NuStep   Level 1    SPM 80    Minutes 15    METs 1.1      Track   Laps 16    Minutes 15    METs 1.87      Prescription Details   Frequency (times per week) 2    Duration Progress to 30 minutes of continuous aerobic without signs/symptoms of physical distress      Intensity   THRR 40-80% of Max Heartrate 106-134    Ratings of Perceived Exertion 11-13    Perceived Dyspnea 0-4      Progression   Progression Continue to progress workloads to maintain intensity without signs/symptoms of physical distress.      Resistance Training   Training Prescription Yes    Weight 3 lb    Reps 10-15             Perform Capillary Blood Glucose checks as needed.  Exercise Prescription Changes:   Exercise Prescription Changes     Row Name 07/08/21 1300 07/26/21 1400 08/09/21 1000         Response to Exercise   Blood Pressure (Admit) 104/64 120/64 110/60     Blood Pressure (Exercise) 112/66 120/66 114/64     Blood Pressure (Exit) 102/66 104/64 106/64     Heart Rate (Admit) 79 bpm 91 bpm 94 bpm     Heart Rate (Exercise) 105 bpm 100 bpm 109 bpm     Heart Rate (Exit) 85 bpm 89 bpm 94 bpm     Oxygen Saturation (Admit) 97 % 97 % 97 %     Oxygen Saturation (Exercise) 96 % 95 % 95 %     Oxygen Saturation (Exit) 98 % 96 % 96 %     Rating of Perceived Exertion (Exercise) _0 Perceived Dyspnea (Exercise) 2 -- --     Symptoms Unsteady on feet -- none     Comments walk test results third day --     Duration -- Progress to 30 minutes of  aerobic without signs/symptoms of physical distress Continue with 30 min of aerobic exercise without signs/symptoms  of physical distress.     Intensity -- THRR unchanged THRR unchanged       Progression   Progression -- Continue to progress workloads to maintain intensity without signs/symptoms of physical distress. Continue to progress workloads to maintain intensity without  signs/symptoms of physical distress.     Average METs -- 1.85 1.94       Resistance Training   Training Prescription -- Yes Yes     Weight -- 3 lb 3 lb     Reps -- 10-15 10-15       Interval Training   Interval Training -- -- No       NuStep   Level -- 1 2     SPM -- 80 --     Minutes -- 15 15     METs -- -- 2.1       T5 Nustep   Level -- -- 1     Minutes -- -- 15     METs -- -- 1.9       Track   Laps -- 5 15     Minutes -- 15 15     METs -- 1.27 1.82       Oxygen   Maintain Oxygen Saturation -- -- 88% or higher              Exercise Comments:   Exercise Goals and Review:   Exercise Goals     Row Name 07/08/21 1358             Exercise Goals   Increase Physical Activity Yes       Intervention Provide advice, education, support and counseling about physical activity/exercise needs.;Develop an individualized exercise prescription for aerobic and resistive training based on initial evaluation findings, risk stratification, comorbidities and participant's personal goals.       Expected Outcomes Short Term: Attend rehab on a regular basis to increase amount of physical activity.;Long Term: Add in home exercise to make exercise part of routine and to increase amount of physical activity.;Long Term: Exercising regularly at least 3-5 days a week.       Increase Strength and Stamina Yes       Intervention Provide advice, education, support and counseling about physical activity/exercise needs.;Develop an individualized exercise prescription for aerobic and resistive training based on initial evaluation findings, risk stratification, comorbidities and participant's personal goals.       Expected Outcomes Short Term: Increase workloads from initial exercise prescription for resistance, speed, and METs.;Short Term: Perform resistance training exercises routinely during rehab and add in resistance training at home;Long Term: Improve cardiorespiratory fitness, muscular  endurance and strength as measured by increased METs and functional capacity (6MWT)       Able to understand and use rate of perceived exertion (RPE) scale Yes       Intervention Provide education and explanation on how to use RPE scale       Expected Outcomes Short Term: Able to use RPE daily in rehab to express subjective intensity level;Long Term:  Able to use RPE to guide intensity level when exercising independently       Able to understand and use Dyspnea scale Yes       Intervention Provide education and explanation on how to use Dyspnea scale       Expected Outcomes Short Term: Able to use Dyspnea scale daily in rehab to express subjective sense of shortness of breath during exertion;Long Term: Able to use Dyspnea scale to guide intensity  level when exercising independently       Knowledge and understanding of Target Heart Rate Range (THRR) Yes       Intervention Provide education and explanation of THRR including how the numbers were predicted and where they are located for reference       Expected Outcomes Short Term: Able to state/look up THRR;Long Term: Able to use THRR to govern intensity when exercising independently;Short Term: Able to use daily as guideline for intensity in rehab       Able to check pulse independently Yes       Intervention Provide education and demonstration on how to check pulse in carotid and radial arteries.;Review the importance of being able to check your own pulse for safety during independent exercise       Expected Outcomes Short Term: Able to explain why pulse checking is important during independent exercise;Long Term: Able to check pulse independently and accurately       Understanding of Exercise Prescription Yes       Intervention Provide education, explanation, and written materials on patient's individual exercise prescription       Expected Outcomes Short Term: Able to explain program exercise prescription;Long Term: Able to explain home exercise  prescription to exercise independently                Exercise Goals Re-Evaluation :  Exercise Goals Re-Evaluation     Row Name 07/14/21 1538 07/26/21 1501 08/09/21 1022         Exercise Goal Re-Evaluation   Exercise Goals Review Increase Physical Activity;Able to understand and use rate of perceived exertion (RPE) scale;Knowledge and understanding of Target Heart Rate Range (THRR);Understanding of Exercise Prescription;Increase Strength and Stamina;Able to check pulse independently;Able to understand and use Dyspnea scale Increase Physical Activity;Increase Strength and Stamina Increase Physical Activity;Increase Strength and Stamina     Comments Reviewed RPE and dyspnea scales, THR and program prescription with pt today.  Pt voiced understanding and was given a copy of goals to take home. TRUE has tolerated exercise well in her first sessions. Oxygen has stayed in mid 90s during all sessions. Mandy Jones is doing well in rehab. Her attendance has not been consistent, however, she is switching class times which should help her stay consistent. She has increased to level 2 on the T4 Nustep and continues to walk the track. She does hit her [place] when walking. Will continue to monitor progression once her attendance is better.     Expected Outcomes Short: Use RPE daily to regulate intensity. Long: Follow program prescription in THR. Short: attend consistently Long: build overall stamina Short: Maintain consistent attendance Long: Increase overall strength and MET level              Discharge Exercise Prescription (Final Exercise Prescription Changes):  Exercise Prescription Changes - 08/09/21 1000       Response to Exercise   Blood Pressure (Admit) 110/60    Blood Pressure (Exercise) 114/64    Blood Pressure (Exit) 106/64    Heart Rate (Admit) 94 bpm    Heart Rate (Exercise) 109 bpm    Heart Rate (Exit) 94 bpm    Oxygen Saturation (Admit) 97 %    Oxygen Saturation (Exercise) 95 %     Oxygen Saturation (Exit) 96 %    Rating of Perceived Exertion (Exercise) 12    Symptoms none    Duration Continue with 30 min of aerobic exercise without signs/symptoms of physical distress.    Intensity THRR unchanged  Progression   Progression Continue to progress workloads to maintain intensity without signs/symptoms of physical distress.    Average METs 1.94      Resistance Training   Training Prescription Yes    Weight 3 lb    Reps 10-15      Interval Training   Interval Training No      NuStep   Level 2    Minutes 15    METs 2.1      T5 Nustep   Level 1    Minutes 15    METs 1.9      Track   Laps 15    Minutes 15    METs 1.82      Oxygen   Maintain Oxygen Saturation 88% or higher             Nutrition:  Target Goals: Understanding of nutrition guidelines, daily intake of sodium <1568m, cholesterol <2051m calories 30% from fat and 7% or less from saturated fats, daily to have 5 or more servings of fruits and vegetables.  Education: All About Nutrition: -Group instruction provided by verbal, written material, interactive activities, discussions, models, and posters to present general guidelines for heart healthy nutrition including fat, fiber, MyPlate, the role of sodium in heart healthy nutrition, utilization of the nutrition label, and utilization of this knowledge for meal planning. Follow up email sent as well. Written material given at graduation. [facility]  Education need identified 07/08/21       Biometrics:  Pre Biometrics - 07/08/21 1122       Pre Biometrics   Height 5' 2.25" (1.581 m)    Weight 227 lb 14.4 oz (103.4 kg)    BMI (Calculated) 41.36    Single Leg Stand 8.94 seconds              Nutrition Therapy Plan and Nutrition Goals:  Nutrition Therapy & Goals - 07/29/21 1333       Nutrition Therapy   Diet Heart healthy, low Na    Protein (specify  units) 85g    Fiber 25 grams    Whole Grain Foods 3 servings    Saturated Fats 12 max. grams    Fruits and Vegetables 8 servings/day    Sodium 1.5 grams      Personal Nutrition Goals   Nutrition Goal ST: calculate sodium intake for 3 random days, use <1/4 tsp salt when cooking LT: limit Na < 1.5g/day    Comments B: something small - whole grain cereal or eggs/peppers/potatoes L: "hit or miss" every other day. leftovers mostly or [employer] if she is on the road (grilled chicken sandwich) - 2x/week D: mostly vegetables (seasonally, colorful, salads, sometimes sweet potatoes, all squashes) with small amount of protein - size of her palm (chicken, pork, fish - sea bass, salmon, tuna, shrimp) as well as ancient grains. She makes a lot of stirfries she uses glass noodles. And will sometimes have spaghetti. She uses olive oil and some butter. She salts her food sometimes while cooking (max 1/2 tsp) and salt-free seasonings. Drinks: 2-3 cups of coffee (Oatmilk cream and sweetener), water and seltzer, tea (black). She tries to not keep sugar in the house becuase she feels like it is a problem for her. Reviewed heart healthy eating and      Intervention Plan   Intervention Prescribe, educate and counsel regarding individualized specific dietary modifications aiming towards targeted core components such as weight, hypertension,  lipid management, diabetes, heart failure and other comorbidities.;Nutrition handout(s) given to patient.    Expected Outcomes Short Term Goal: Understand basic principles of dietary content, such as calories, fat, sodium, cholesterol and nutrients.;Short Term Goal: A plan has been developed with personal nutrition goals set during dietitian appointment.;Long Term Goal: Adherence to prescribed nutrition plan.             Nutrition Assessments:  MEDIFICTS Score Key: ?70 Need to make dietary changes  40-70 Heart Healthy Diet ? 40 Therapeutic Level Cholesterol Diet  Flowsheet Row  Pulmonary Rehab from 07/08/2021 in [facility] Cardiac and Pulmonary Rehab  Picture Your Plate Total Score on Admission 65      Picture Your Plate Scores: <91 Unhealthy dietary pattern with much room for improvement. 41-50 Dietary pattern unlikely to meet recommendations for good health and room for improvement. 51-60 More healthful dietary pattern, with some room for improvement.  >60 Healthy dietary pattern, although there may be some specific behaviors that could be improved.   Nutrition Goals Re-Evaluation:   Nutrition Goals Discharge (Final Nutrition Goals Re-Evaluation):   Psychosocial: Target Goals: Acknowledge presence or absence of significant depression and/or stress, maximize coping skills, provide positive support system. Participant is able to verbalize types and ability to use techniques and skills needed for reducing stress and depression.   Education: Stress, Anxiety, and Depression - Group verbal and visual presentation to define topics covered.  Reviews how body is impacted by stress, anxiety, and depression.  Also discusses healthy ways to reduce stress and to treat/manage anxiety and depression.  Written material given at graduation.   Education: Sleep Hygiene -Provides group verbal and written instruction about how sleep can affect your health.  Define sleep hygiene, discuss sleep cycles and impact of sleep habits. Review good sleep hygiene tips.    Initial Review & Psychosocial Screening:  Initial Psych Review & Screening - 07/05/21 1413       Initial Review   Current issues with Current Sleep Concerns;Current Stress Concerns    Source of Stress Concerns Chronic Illness;Unable to participate in former interests or hobbies;Unable to perform yard/household activities      [place]? No    Concerns No support system      Barriers   Psychosocial barriers to participate in program There are no identifiable barriers or psychosocial needs.       Screening Interventions   Interventions Encouraged to exercise;Provide feedback about the scores to participant;To provide support and resources with identified psychosocial needs    Expected Outcomes Short Term goal: Utilizing psychosocial counselor, staff and physician to assist with identification of specific Stressors or current issues interfering with healing process. Setting desired goal for each stressor or current issue identified.;Long Term Goal: Stressors or current issues are controlled or eliminated.;Short Term goal: Identification and review with participant of any Quality of Life or Depression concerns found by scoring the questionnaire.;Long Term goal: The participant improves quality of Life and PHQ9 Scores as seen by post scores and/or verbalization of changes             Quality of Life Scores:  Scores of 19 and below usually indicate a poorer quality of life in these areas.  A difference of  2-3 points is a clinically meaningful difference.  A difference of 2-3 points in the total score of the Quality of Life Index has been associated with significant improvement in overall quality of life, self-image, physical symptoms, and general health in  studies assessing change in quality of life.  PHQ-9: Recent Review Flowsheet Data     Depression screen [facility] 2/9 07/28/2021 07/08/2021 03/03/2021   Decreased Interest 0 1 0   Down, Depressed, Hopeless 0 2 1   PHQ - 2 Score 0 3 1   Altered sleeping - 3 -   Tired, decreased energy - 3 -   Change in appetite - 2 -   Feeling bad or failure about yourself  - 1 -   Trouble concentrating - 1 -   Moving slowly or fidgety/restless - 1 -   Suicidal thoughts - 0 -   PHQ-9 Score - 14 -   Difficult doing work/chores - Somewhat difficult -      Interpretation of Total Score  Total Score Depression Severity:  1-4 = Minimal depression, 5-9 = Mild depression, 10-14 = Moderate depression, 15-19 = Moderately severe depression, 20-27 =  Severe depression   Psychosocial Evaluation and Intervention:  Psychosocial Evaluation - 07/05/21 1424       Psychosocial Evaluation & Interventions   Interventions Stress management education;Encouraged to exercise with the program and follow exercise prescription    Comments Mandy Jones reports her heart failure symptoms have been getting in the way of her daily life for the last 5 years. She feels like she can't participate in her hobbies or take her dogs places like she used to. Her mother passed away in the last two years and her only brother that is close by doesn't speak to her. [place] with [place] dogs, so she states she doesn't have a real support system. She was exercising regularly trying to lose weight but her heart failure symptoms have gotten in the way and now she is up to a weight she has never been before and is hating it. She wants to feel better and increase her stamina as well as lose weight. She does have some vestibular issues post the second Covid vaccine that also causes some balance issues.She takes medication to sleep but also takes a diuretic so she feels like she is constantly in the bathroom. She does have a copay so she may not be able to complete the total number of sessions, but she is very motivated to do whatever it takes to gain some control back in her life.    Expected Outcomes Short; attend Pulmonary Rehab for exercise and education. Long; develop and maintain positive self care habits.    Continue Psychosocial Services  Follow up required by staff             Psychosocial Re-Evaluation:   Psychosocial Discharge (Final Psychosocial Re-Evaluation):   Education: Education Goals: Education classes will be provided on a weekly basis, covering required topics. Participant will state understanding/return demonstration of topics presented.  Learning Barriers/Preferences:  Learning Barriers/Preferences - 07/05/21 1413        Learning Barriers/Preferences   Learning Barriers None    Learning Preferences None             General Pulmonary Education Topics:  Infection Prevention: - Provides verbal and written material to individual with discussion of infection control including proper hand washing and proper equipment cleaning during exercise session. Flowsheet Row Pulmonary Rehab from 08/04/2021 [facility] Cardiac and Pulmonary Rehab  Education need identified 07/08/21  Date 07/08/21  Educator [place]  Instruction Review Code 1- Verbalizes Understanding       Falls Prevention: - Provides verbal and written material to individual  with discussion of falls prevention and safety. Flowsheet Row Pulmonary Rehab from 08/04/2021 in [facility] Cardiac and Pulmonary Rehab  Education need identified 07/08/21  Date 07/08/21  Educator [place]  Instruction Review Code 1- Verbalizes Understanding       Chronic Lung Disease Review: - Group verbal instruction with posters, models, PowerPoint presentations and videos,  to review new updates, new respiratory medications, new advancements in procedures and treatments. Providing information on websites and "800" numbers for continued self-education. Includes information about supplement oxygen, available portable oxygen systems, continuous and intermittent flow rates, oxygen safety, concentrators, and Medicare reimbursement for oxygen. Explanation of Pulmonary Drugs, including class, frequency, complications, importance of spacers, rinsing mouth after steroid MDI's, and proper cleaning methods for nebulizers. Review of basic lung anatomy and physiology related to function, structure, and complications of lung disease. Review of risk factors. Discussion about methods for diagnosing sleep apnea and types of masks and machines for OSA. Includes a review of the use of types of environmental controls: home humidity, furnaces, filters, dust mite/pet prevention, HEPA vacuums. Discussion about weather  changes, air quality and the benefits of nasal washing. Instruction on Warning signs, infection symptoms, calling MD promptly, preventive modes, and value of vaccinations. Review of effective airway clearance, coughing and/or vibration techniques. Emphasizing that all should Create an Action Plan. Written material given at graduation. Flowsheet Row Pulmonary Rehab from 08/04/2021 in [facility] Cardiac and Pulmonary Rehab  Date 08/04/21  Educator [facility]  Instruction Review Code 1- Verbalizes Understanding       AED/CPR: - Group verbal and written instruction with the use of models to demonstrate the basic use of the AED with the basic ABC's of resuscitation.    Anatomy and Cardiac Procedures: - Group verbal and visual presentation and models provide information about basic cardiac anatomy and function. Reviews the testing methods done to diagnose heart disease and the outcomes of the test results. Describes the treatment choices: Medical Management, Angioplasty, or Coronary Bypass Surgery for treating various heart conditions including Myocardial Infarction, Angina, Valve Disease, and Cardiac Arrhythmias.  Written material given at graduation.   Medication Safety: - Group verbal and visual instruction to review commonly prescribed medications for heart and lung disease. Reviews the medication, class of the drug, and side effects. Includes the steps to properly store meds and maintain the prescription regimen.  Written material given at graduation. Flowsheet Row Pulmonary Rehab from 08/04/2021 in [facility] Cardiac and Pulmonary Rehab  Date 07/21/21  Educator [place]  Instruction Review Code 1- Verbalizes Understanding       Other: -Provides group and verbal instruction on various topics (see comments)   Knowledge Questionnaire Score:  Knowledge Questionnaire Score - 07/08/21 1120       Knowledge Questionnaire Score   Pre Score 23/26 (cardiac): Diabetes, Nutrition, Aerobic              Core  Components/Risk Factors/Patient Goals at Admission:  Personal Goals and Risk Factors at Admission - 07/08/21 1358       Core Components/Risk Factors/Patient Goals on Admission    Weight Management Yes;Weight Loss    Intervention Weight Management: Develop a combined nutrition and exercise program designed to reach desired caloric intake, while maintaining appropriate intake of nutrient and fiber, sodium and fats, and appropriate energy expenditure required for the weight goal.;Weight Management: Provide education and appropriate resources to help participant work on and attain dietary goals.;Obesity: Provide education and appropriate resources to help participant work on and attain dietary goals.    Admit  Weight 227 lb (103 kg)    Goal Weight: Short Term 222 lb (100.7 kg)    Goal Weight: Long Term 150 lb (68 kg)    Expected Outcomes Short Term: Continue to assess and modify interventions until short term weight is achieved;Long Term: Adherence to nutrition and physical activity/exercise program aimed toward attainment of established weight goal;Weight Loss: Understanding of general recommendations for a balanced deficit meal plan, which promotes 1-2 lb weight loss per week and includes a negative energy balance of [phone] kcal/d;Understanding recommendations for meals to include 15-35% energy as protein, 25-35% energy from fat, 35-60% energy from carbohydrates, less than 257m of dietary cholesterol, 20-35 gm of total fiber daily;Understanding of distribution of calorie intake throughout the day with the consumption of 4-5 meals/snacks    Heart Failure Yes    Intervention Provide a combined exercise and nutrition program that is supplemented with education, support and counseling about heart failure. Directed toward relieving symptoms such as shortness of breath, decreased exercise tolerance, and extremity edema.    Expected Outcomes Short term: Attendance in program 2-3 days a week with increased  exercise capacity. Reported lower sodium intake. Reported increased fruit and vegetable intake. Reports medication compliance.;Improve functional capacity of life;Short term: Daily weights obtained and reported for increase. Utilizing diuretic protocols set by physician.;Long term: Adoption of self-care skills and reduction of barriers for early signs and symptoms recognition and intervention leading to self-care maintenance.    Hypertension Yes    Intervention Provide education on lifestyle modifcations including regular physical activity/exercise, weight management, moderate sodium restriction and increased consumption of fresh fruit, vegetables, and low fat dairy, alcohol moderation, and smoking cessation.;Monitor prescription use compliance.    Expected Outcomes Short Term: Continued assessment and intervention until BP is < 140/962mHG in hypertensive participants. < 130/8017mG in hypertensive participants with diabetes, heart failure or chronic kidney disease.;Long Term: Maintenance of blood pressure at goal levels.    Lipids Yes    Intervention Provide education and support for participant on nutrition & aerobic/resistive exercise along with prescribed medications to achieve LDL <79m95mDL >40mg61m Expected Outcomes Short Term: Participant states understanding of desired cholesterol values and is compliant with medications prescribed. Participant is following exercise prescription and nutrition guidelines.;Long Term: Cholesterol controlled with medications as prescribed, with individualized exercise RX and with personalized nutrition plan. Value goals: LDL < 79mg,50m > 40 mg.             Education:Diabetes - Individual verbal and written instruction to review signs/symptoms of diabetes, desired ranges of glucose level fasting, after meals and with exercise. Acknowledge that pre and post exercise glucose checks will be done for 3 sessions at entry of program.   Know Your Numbers and Heart  Failure: - Group verbal and visual instruction to discuss disease risk factors for cardiac and pulmonary disease and treatment options.  Reviews associated critical values for Overweight/Obesity, Hypertension, Cholesterol, and Diabetes.  Discusses basics of heart failure: signs/symptoms and treatments.  Introduces Heart Failure Zone chart for action plan for heart failure.  Written material given at graduation. Flowsheet Row [facility]  Education need identified 07/08/21       Core Components/Risk Factors/Patient Goals Review:    Core Components/Risk Factors/Patient Goals at Discharge (Final Review):    ITP Comments:  ITP Comments     Row Name 07/05/21 1434 07/08/21 1121 07/14/21 1534 07/28/21 0724 07/29/21 1444   ITP Comments Initial  telephone orientation completed. Diagnosis can be found in [facility] 8/24. EP orientation scheduled for Thursday 11/10 at 9:30. Completed 6MWT and gym orientation. Initial ITP created and sent for review to Dr. Ottie Glazier, Medical Director. First full day of exercise!  Patient was oriented to gym and equipment including functions, settings, policies, and procedures.  Patient's individual exercise prescription and treatment plan were reviewed.  All starting workloads were established based on the results of the 6 minute walk test done at initial orientation visit.  The plan for exercise progression was also introduced and progression will be customized based on patient's performance and goals. 30 Day review completed. Medical Director ITP review done, changes made as directed, and signed approval by Medical Director.   New to program Completed initial RD consultation    Row Name 08/17/21 1103 08/25/21 0744         ITP Comments Mandy Jones has missed some sessions and is travelling for the holiday.  Staff were not able to speak with her about goals. 30 Day review completed. Medical Director ITP review done, changes  made as directed, and signed approval by Medical Director.               Comments:

## 2021-08-26 ENCOUNTER — Ambulatory Visit: Payer: Medicare HMO | Admitting: Family

## 2021-08-26 ENCOUNTER — Other Ambulatory Visit: Payer: Self-pay | Admitting: Family

## 2021-08-31 ENCOUNTER — Encounter: Payer: HMO | Attending: Internal Medicine

## 2021-08-31 ENCOUNTER — Other Ambulatory Visit: Payer: Self-pay

## 2021-08-31 DIAGNOSIS — I5022 Chronic systolic (congestive) heart failure: Secondary | ICD-10-CM | POA: Diagnosis present

## 2021-08-31 NOTE — Progress Notes (Signed)
Daily Session Note  Patient Details  Name: Mandy Jones MRN: [id number] Date of Birth: 09-May-1951 Referring Provider:   Flowsheet Row Pulmonary Rehab from 07/08/2021 in [facility] Cardiac and Pulmonary Rehab  Referring Provider Serafina Royals MD       Encounter Date: 08/31/2021  Check In:  Session Check In - 08/31/21 1117       Check-In   Supervising physician immediately available to respond to emergencies See telemetry face sheet for immediately available ER MD    Location [facility]-Cardiac & Pulmonary Rehab    Staff Present Birdie Sons, MPA, RN;Melissa [place], RDN, LDN;Jessica Coy, MA, RCEP, CCRP, CCET;Amanda Sommer, BA, ACSM CEP, Exercise Physiologist    Virtual Visit No    Medication changes reported     No    Fall or balance concerns reported    No    Tobacco Cessation No Change    Warm-up and Cool-down Performed on first and last piece of equipment    Resistance Training Performed Yes    VAD Patient? No    PAD/SET Patient? No      Pain Assessment   Currently in Pain? No/denies                Social History   Tobacco Use  Smoking Status Former  Smokeless Tobacco Never  Tobacco Comments   quit 30 years ago    Goals Met:  Independence with exercise equipment Exercise tolerated well Personal goals reviewed No report of concerns or symptoms today Strength training completed today  Goals Unmet:  Not Applicable  Comments: Pt able to follow exercise prescription today without complaint.  Will continue to monitor for progression.    Dr. Emily Filbert is Medical Director for [facility].  Dr. Ottie Glazier is Medical Director for [facility] Pulmonary Rehabilitation.

## 2021-09-02 ENCOUNTER — Other Ambulatory Visit: Payer: Self-pay

## 2021-09-02 DIAGNOSIS — I5022 Chronic systolic (congestive) heart failure: Secondary | ICD-10-CM | POA: Diagnosis not present

## 2021-09-02 NOTE — Progress Notes (Signed)
Daily Session Note  Patient Details  Name: Mandy Jones MRN: [id number] Date of Birth: 1950-09-21 Referring Provider:   Flowsheet Row Pulmonary Rehab from 07/08/2021 in [facility] Cardiac and Pulmonary Rehab  Referring Provider Serafina Royals MD       Encounter Date: 09/02/2021  Check In:  Session Check In - 09/02/21 1042       Check-In   Supervising physician immediately available to respond to emergencies See telemetry face sheet for immediately available ER MD    Location [facility]-Cardiac & Pulmonary Rehab    Staff Present Birdie Sons, MPA, RN;Meredith Sherryll Burger, RN BSN;Melissa [place]ai[place], RDN, Rowe Pavy, BA, ACSM CEP, Exercise Physiologist    Virtual Visit No    Medication changes reported     No    Fall or balance concerns reported    No    Tobacco Cessation No Change    Warm-up and Cool-down Performed on first and last piece of equipment    Resistance Training Performed No   left a little early   VAD Patient? No    PAD/SET Patient? No      Pain Assessment   Currently in Pain? No/denies                Social History   Tobacco Use  Smoking Status Former  Smokeless Tobacco Never  Tobacco Comments   quit 30 years ago    Goals Met:  Independence with exercise equipment Exercise tolerated well No report of concerns or symptoms today Strength training completed today  Goals Unmet:  Not Applicable  Comments: Pt able to follow exercise prescription today without complaint.  Will continue to monitor for progression.    Dr. Emily Filbert is Medical Director for [facility].  Dr. Ottie Glazier is Medical Director for [facility] Pulmonary Rehabilitation.

## 2021-09-06 NOTE — Progress Notes (Signed)
Patient ID: Mandy Jones, female    DOB: [birth year], 71 y.o.   MRN: [id number]  HPI  Mandy Jones is a 71 y/o female with a history of atrial fibrillation, HTN, thyroid disease, GERD, sleep apnea, depression, fibromyalgia, hyperlipidemia, previous tobacco use and chronic heart failure.   Echo report from 10/15/20 reviewed and showed an EF of 40%. Echo report from 02/10/20 reviewed and showed an EF of 55-60% along with mild LAE.   Admitted 02/23/21 due to fever, cough & shortness of breath due to pneumonia. IV antibiotics initially given with transition to oral medication. Discharged after 2 days.   She presents today for a follow-up visit with a chief complaint of minimal shortness of breath upon moderate exertion. She describes this as chronic in nature having been present for several years. She has associated fatigue, abdominal distention, intermittent difficulty sleeping, chronic pain and itching over lower abdomen (since torsemide) along with this. She denies any dizziness (although remains wobbly), palpitations, pedal edema, chest pain, cough or weight gain.   She expresses concern about her long-term prognosis and says that she has 3 dogs that she would want to re-home before she passes away. Becomes tearful when talking about this.   Past Medical History:  Diagnosis Date   Arrhythmia    atrial fibrillation   Arthritis    CHF (congestive heart failure) (HCC)    Depression    Dyspnea    Dysrhythmia    Fibromyalgia 1992   GERD (gastroesophageal reflux disease)    Hepatitis    Hepatitis B 1985   Hyperlipidemia    Hypertension    Mitral valve prolapse    Pacemaker    Pacemaker 01/13/2021   Sleep apnea    CPAP   Tendon tear, ankle    rt foot   Thyroid dysfunction    Past Surgical History:  Procedure Laterality Date   ACHILLES TENDON SURGERY Right 04/06/2018   Procedure: ACHILLES TENDON REPAIR;  Surgeon: [name], DPM;  Location: [facility];  Service: Podiatry;   Laterality: Right;   CARDIOVERSION N/A 02/06/2018   Procedure: CARDIOVERSION;  Surgeon: Corey Skains, MD;  Location: [facility];  Service: Cardiovascular;  Laterality: N/A;   CARDIOVERSION N/A 03/28/2018   Procedure: CARDIOVERSION;  Surgeon: Corey Skains, MD;  Location: [facility];  Service: Cardiovascular;  Laterality: N/A;   CARDIOVERSION N/A 05/09/2018   Procedure: CARDIOVERSION;  Surgeon: Corey Skains, MD;  Location: [facility];  Service: Cardiovascular;  Laterality: N/A;   CARDIOVERSION N/A 10/23/2019   Procedure: CARDIOVERSION;  Surgeon: Corey Skains, MD;  Location: [place] [facility];  Service: Cardiovascular;  Laterality: N/A;   CARDIOVERSION N/A 09/30/2020   Procedure: CARDIOVERSION;  Surgeon: Corey Skains, MD;  Location: [facility];  Service: Cardiovascular;  Laterality: N/A;   CARDIOVERSION N/A 11/19/2020   Procedure: CARDIOVERSION;  Surgeon: Corey Skains, MD;  Location: [facility];  Service: Cardiovascular;  Laterality: N/A;   CARDIOVERSION N/A 03/16/2021   Procedure: CARDIOVERSION;  Surgeon: Corey Skains, MD;  Location: [facility];  Service: Cardiovascular;  Laterality: N/A;   CHOLECYSTECTOMY     CONTINUOUS NERVE MONITORING N/A 08/03/2017   Procedure: LARYNGEAL NERVE MONITORING;  Surgeon: Carloyn Manner, MD;  Location: [facility];  Service: ENT;  Laterality: N/A;   EXTRACORPOREAL SHOCK WAVE LITHOTRIPSY Right 02/13/2020   Procedure: EXTRACORPOREAL SHOCK WAVE LITHOTRIPSY (ESWL);  Surgeon: Billey Co, MD;  Location: [facility];  Service: Urology;  Laterality: Right;   EYE SURGERY Bilateral  FOOT SURGERY Left 2000   JOINT REPLACEMENT     left knee   OSTECTOMY Right 04/06/2018   Procedure: OSTECTOMY-HAGLUNDS/RECTROCALCANEAL;  Surgeon: [name], DPM;  Location: [facility];  Service: Podiatry;  Laterality: Right;   SHOULDER ARTHROSCOPY Bilateral    SHOULDER ARTHROSCOPY Left    THYROIDECTOMY N/A 08/03/2017   Procedure: THYROIDECTOMY;  Surgeon: Carloyn Manner, MD;   Location: [facility];  Service: ENT;  Laterality: N/A;   TONSILLECTOMY     TUBAL LIGATION     Family History  Problem Relation Age of Onset   Hypertension Mother    Gastric cancer Father    Social History   Tobacco Use   Smoking status: Former   Smokeless tobacco: Never   Tobacco comments:    quit 30 years ago  Substance Use Topics   Alcohol use: Yes    Comment: occassional   Allergies  Allergen Reactions   Silver Other (See Comments)     Causes blisters.   Tape Rash    Pads used for ablation left a rash for a couple of days.   Prior to Admission medications   Medication Sig Start Date End Date Taking? Authorizing Provider  apixaban (ELIQUIS) 5 MG TABS tablet Take 5 mg by mouth every 12 (twelve) hours. 02/14/20  Yes Val Riles, MD  Ascorbic Acid (VITAMIN C) 1000 MG tablet Take 1,000 mg by mouth every evening.   Yes [provider]  B Complex-C (SUPER B COMPLEX PO) Take 1 tablet by mouth daily.   Yes [provider]  Boswellia-Glucosamine-Vit D (OSTEO BI-FLEX ONE PER DAY) TABS Take 1 tablet by mouth daily.   Yes [provider]  Calcium-Magnesium-Vitamin D (CALCIUM MAGNESIUM PO) Take 1 tablet by mouth daily.   Yes [provider]  Cholecalciferol (VITAMIN D3) 50 MCG (2000 UT) TABS Take 2,000 Units by mouth daily.   Yes [provider]  Coenzyme Q10 (CO Q 10) 100 MG CAPS Take 100 mg by mouth daily.   Yes [provider]  famotidine (PEPCID) 20 MG tablet Take 40 mg by mouth at bedtime. 10/09/19  Yes [provider]  levothyroxine (SYNTHROID) 150 MCG tablet Take 150 mcg by mouth daily before breakfast. 10/15/19  Yes [provider]  melatonin 5 MG TABS Take 5 mg by mouth at bedtime as needed (sleep).   Yes [provider]  Multiple Vitamins-Minerals (PRESERVISION AREDS 2 PO) Take 1 tablet by mouth daily.   Yes [provider]  potassium chloride (KLOR-CON) 10 MEQ tablet Take 20 mEq by mouth  daily. 09/12/20  Yes [provider]  Red Yeast Rice Extract 600 MG CAPS Take 600 mg by mouth every evening.   Yes [provider]  sacubitril-valsartan (ENTRESTO) 24-26 MG Take 1 tablet by mouth 2 (two) times daily. 03/03/21  Yes Tramayne Sebesta, Otila Kluver A, FNP  spironolactone (ALDACTONE) 25 MG tablet TAKE 1 TABLET (25 MG TOTAL) BY MOUTH DAILY. 08/26/21 11/24/21 Yes Flavia Bruss, Otila Kluver A, FNP  torsemide (DEMADEX) 100 MG tablet Take 0.5 tablets (50 mg total) by mouth daily. 07/28/21 10/26/21 Yes Shakeem Stern, Aura Fey, FNP  traZODone (DESYREL) 50 MG tablet Take 50-150 mg by mouth at bedtime as needed for sleep. 12/04/17  Yes [provider]  valACYclovir (VALTREX) 1000 MG tablet Take 2,000 mg by mouth daily as needed (For breakout). 11/23/20  Yes [provider]  XIIDRA 5 % SOLN Apply 1 drop to eye 2 (two) times daily. 05/25/21  Yes [provider]  Ashwagandha 500  MG CAPS Take 1,000 mg by mouth daily. Patient not taking: Reported on 09/07/2021    [provider]   Review of Systems  Constitutional:  Positive for fatigue. Negative for appetite change.  HENT:  Negative for congestion, postnasal drip and sore throat.   Eyes: Negative.   Respiratory:  Positive for shortness of breath (minimal). Negative for cough and chest tightness.   Cardiovascular:  Negative for chest pain, palpitations and leg swelling.  Gastrointestinal:  Positive for abdominal distention. Negative for abdominal pain.  Endocrine: Negative.   Musculoskeletal:  Positive for arthralgias (left shoulder) and myalgias (right thigh). Negative for back pain.  Skin:  Negative for rash.       Itching lower abdomen  Allergic/Immunologic: Negative.   Neurological:  Negative for dizziness and light-headedness.  Hematological:  Negative for adenopathy. Does not bruise/bleed easily.  Psychiatric/Behavioral:  Positive for sleep disturbance (interrupted sleep; sleeping on 1-2 pillows; wearing CPAP most nights). Negative  for dysphoric mood. The patient is not nervous/anxious.    Vitals:   09/07/21 1239  BP: 100/61  Pulse: 94  Resp: 18  SpO2: 97%  Weight: 233 lb 2 oz (105.7 kg)  Height: 5\' 2"  (1.575 m)   Wt Readings from Last 3 Encounters:  09/07/21 233 lb 2 oz (105.7 kg)  07/28/21 235 lb 3 oz (106.7 kg)  07/15/21 229 lb 11.2 oz (104.2 kg)   Lab Results  Component Value Date   CREATININE 0.74 04/21/2021   CREATININE 0.62 02/25/2021   CREATININE 0.54 02/24/2021    Physical Exam Vitals and nursing note reviewed.  Constitutional:      Appearance: Normal appearance.  HENT:     Head: Normocephalic and atraumatic.  Cardiovascular:     Rate and Rhythm: Normal rate. Rhythm irregular.  Pulmonary:     Effort: Pulmonary effort is normal. No respiratory distress.     Breath sounds: No wheezing or rales.  Abdominal:     General: There is no distension.     Palpations: Abdomen is soft.  Musculoskeletal:        General: No tenderness.     Cervical back: Normal range of motion and neck supple.     Right lower leg: No edema.     Left lower leg: No edema.  Skin:    General: Skin is warm and dry.  Neurological:     General: No focal deficit present.     Mental Status: She is alert and oriented to person, place, and time.  Psychiatric:        Mood and Affect: Mood normal.        Behavior: Behavior normal.        Thought Content: Thought content normal.   Assessment & Plan:  1: Chronic heart failure with reduced ejection fraction- - NYHA class II - euvolemic today - weighing daily; reminded to call for an overnight weight gain of >2 pounds or a weekly weight gain of >5 pounds - weight down 2 pounds from last visit here 6 weeks ago - not adding salt except to pasta water - saw cardiology Mandy Jones) 05/26/21 - on GDMT of entresto & spironolactone - will add jardiance 10mg  daily; 2 weeks samples provided and 30 day voucher given - if jardiance is tolerated, she will need patient assistance but  prefers to wait and fill paperwork out next time - will check BMP today as diuretic was changed to torsemide last visit; depending on today's results, may need to recheck in 1 month -  itching could be related to torsemide but will continue medication for now and patient agreeable to this - emotional support given regarding her life expectancy concerns; discussed palliative care and how they can help work through this process; she will consider this and let us know - participating in pulmonary rehab - saw pulmonology Mandy Jones) 07/15/21 - BNP 02/22/21 was 108.8  2: HTN- - BP looks good (100/61) - saw PCP Mandy Jones) 03/29/21 - BMP 04/21/21 reviewed and showed sodium 139, potassium 4.2, creatinine 0.74 and GFR >60  3: Atrial fibrillation- - previous ablation & cardioversion done  - on apixaban - saw EP Mandy Jones) 04/09/21   Patient did not bring her medications nor a list. Each medication was verbally reviewed with the patient and she was encouraged to bring the bottles to every visit to confirm accuracy of list.  Return in 1 month, sooner if needed for questions/problems before then.

## 2021-09-07 ENCOUNTER — Other Ambulatory Visit
Admission: RE | Admit: 2021-09-07 | Discharge: 2021-09-07 | Disposition: A | Discharge status: Home / Self Care | Payer: HMO | Source: Ambulatory Visit | Attending: Family | Admitting: Family

## 2021-09-07 ENCOUNTER — Other Ambulatory Visit: Payer: Self-pay

## 2021-09-07 ENCOUNTER — Encounter: Payer: Self-pay | Admitting: Family

## 2021-09-07 ENCOUNTER — Telehealth: Payer: Self-pay | Admitting: Family

## 2021-09-07 ENCOUNTER — Ambulatory Visit (HOSPITAL_BASED_OUTPATIENT_CLINIC_OR_DEPARTMENT_OTHER): Payer: HMO | Admitting: Family

## 2021-09-07 VITALS — BP 100/61 | HR 94 | Resp 18 | Ht 62.0 in | Wt 233.1 lb

## 2021-09-07 DIAGNOSIS — G4730 Sleep apnea, unspecified: Secondary | ICD-10-CM | POA: Insufficient documentation

## 2021-09-07 DIAGNOSIS — I110 Hypertensive heart disease with heart failure: Secondary | ICD-10-CM | POA: Insufficient documentation

## 2021-09-07 DIAGNOSIS — M25512 Pain in left shoulder: Secondary | ICD-10-CM | POA: Insufficient documentation

## 2021-09-07 DIAGNOSIS — K219 Gastro-esophageal reflux disease without esophagitis: Secondary | ICD-10-CM | POA: Insufficient documentation

## 2021-09-07 DIAGNOSIS — E079 Disorder of thyroid, unspecified: Secondary | ICD-10-CM | POA: Insufficient documentation

## 2021-09-07 DIAGNOSIS — I10 Essential (primary) hypertension: Secondary | ICD-10-CM

## 2021-09-07 DIAGNOSIS — M797 Fibromyalgia: Secondary | ICD-10-CM | POA: Insufficient documentation

## 2021-09-07 DIAGNOSIS — I5022 Chronic systolic (congestive) heart failure: Secondary | ICD-10-CM

## 2021-09-07 DIAGNOSIS — Z79899 Other long term (current) drug therapy: Secondary | ICD-10-CM | POA: Insufficient documentation

## 2021-09-07 DIAGNOSIS — I509 Heart failure, unspecified: Secondary | ICD-10-CM | POA: Diagnosis present

## 2021-09-07 DIAGNOSIS — I4891 Unspecified atrial fibrillation: Secondary | ICD-10-CM | POA: Insufficient documentation

## 2021-09-07 DIAGNOSIS — Z8701 Personal history of pneumonia (recurrent): Secondary | ICD-10-CM | POA: Insufficient documentation

## 2021-09-07 DIAGNOSIS — Z7901 Long term (current) use of anticoagulants: Secondary | ICD-10-CM | POA: Insufficient documentation

## 2021-09-07 DIAGNOSIS — G8929 Other chronic pain: Secondary | ICD-10-CM | POA: Insufficient documentation

## 2021-09-07 DIAGNOSIS — Z9989 Dependence on other enabling machines and devices: Secondary | ICD-10-CM | POA: Insufficient documentation

## 2021-09-07 DIAGNOSIS — I480 Paroxysmal atrial fibrillation: Secondary | ICD-10-CM

## 2021-09-07 DIAGNOSIS — Z87891 Personal history of nicotine dependence: Secondary | ICD-10-CM | POA: Insufficient documentation

## 2021-09-07 DIAGNOSIS — F32A Depression, unspecified: Secondary | ICD-10-CM | POA: Insufficient documentation

## 2021-09-07 DIAGNOSIS — E785 Hyperlipidemia, unspecified: Secondary | ICD-10-CM | POA: Insufficient documentation

## 2021-09-07 LAB — BASIC METABOLIC PANEL
Anion gap: 9 (ref 5–15)
BUN: 18 mg/dL (ref 8–23)
CO2: 27 mmol/L (ref 22–32)
Calcium: 9.5 mg/dL (ref 8.9–10.3)
Chloride: 101 mmol/L (ref 98–111)
Creatinine, Ser: 0.62 mg/dL (ref 0.44–1.00)
GFR, Estimated: >60 mL/min (ref >60)
Glucose, Bld: 89 mg/dL (ref 70–99)
Potassium: 4.3 mmol/L (ref 3.5–5.1)
Sodium: 137 mmol/L (ref 135–145)

## 2021-09-07 MED ORDER — EMPAGLIFLOZIN 10 MG PO TABS: 10.0000 mg | ORAL_TABLET | Freq: Every day | ORAL | 5 refills | Status: DC

## 2021-09-07 NOTE — Progress Notes (Signed)
Daily Session Note  Patient Details  Name: Mandy Jones MRN: [id number] Date of Birth: Aug 14, 1951 Referring Provider:   Flowsheet Row Pulmonary Rehab from 07/08/2021 in [facility] Cardiac and Pulmonary Rehab  Referring Provider Serafina Royals MD       Encounter Date: 09/07/2021  Check In:  Session Check In - 09/07/21 1104       Check-In   Supervising physician immediately available to respond to emergencies See telemetry face sheet for immediately available ER MD    Location [facility]-Cardiac & Pulmonary Rehab    Staff Present Birdie Sons, MPA, RN;Amanda Sommer, BA, ACSM CEP, Exercise Physiologist;Melissa [place], RDN, LDN;Jessica Hawkins, MA, RCEP, CCRP, CCET    Virtual Visit No    Medication changes reported     No    Fall or balance concerns reported    No    Tobacco Cessation No Change    Warm-up and Cool-down Performed on first and last piece of equipment    Resistance Training Performed Yes    VAD Patient? No    PAD/SET Patient? No      Pain Assessment   Currently in Pain? No/denies                Social History   Tobacco Use  Smoking Status Former  Smokeless Tobacco Never  Tobacco Comments   quit 30 years ago    Goals Met:  Independence with exercise equipment Exercise tolerated well No report of concerns or symptoms today Strength training completed today  Goals Unmet:  Not Applicable  Comments: Pt able to follow exercise prescription today without complaint.  Will continue to monitor for progression. Reviewed home exercise with pt today.  Pt plans to use staff videos and consider joining the [facility] for exercise.  Reviewed THR, pulse, RPE, sign and symptoms, pulse oximetery and when to call 911 or MD.  Also discussed weather considerations and indoor options.  Pt voiced understanding.    Dr. Emily Filbert is Medical Director for [place].  Dr. Ottie Glazier is Medical Director for [facility] Pulmonary  Rehabilitation.

## 2021-09-07 NOTE — Telephone Encounter (Signed)
Reminded patient that we need proof of income for her [employer] application and to bring it in as soon as possible.  Takeria Marquina, NT

## 2021-09-07 NOTE — Patient Instructions (Signed)
Continue weighing daily and call for an overnight weight gain of 3 pounds or more or a weekly weight gain of more than 5 pounds.  °

## 2021-09-09 ENCOUNTER — Other Ambulatory Visit: Payer: Self-pay

## 2021-09-09 DIAGNOSIS — I5022 Chronic systolic (congestive) heart failure: Secondary | ICD-10-CM

## 2021-09-09 NOTE — Progress Notes (Signed)
Daily Session Note  Patient Details  Name: Mandy Jones MRN: [id number] Date of Birth: 1951-01-01 Referring Provider:   Flowsheet Row Pulmonary Rehab from 07/08/2021 in [facility] Cardiac and Pulmonary Rehab  Referring Provider Serafina Royals MD       Encounter Date: 09/09/2021  Check In:  Session Check In - 09/09/21 1030       Check-In   Supervising physician immediately available to respond to emergencies See telemetry face sheet for immediately available ER MD    Location [facility] Pul[facility]    Staff Present Birdie Sons, MPA, RN;Melissa [place], RDN, LDN;Jessica Hawkins, MA, RCEP, CCRP, CCET    Virtual Visit No    Medication changes reported     No    Fall or balance concerns reported    No    Tobacco Cessation No Change    Warm-up and Cool-down Performed on first and last piece of equipment    Resistance Training Performed Yes    VAD Patient? No    PAD/SET Patient? No      Pain Assessment   Currently in Pain? No/denies                Social History   Tobacco Use  Smoking Status Former  Smokeless Tobacco Never  Tobacco Comments   quit 30 years ago    Goals Met:  Independence with exercise equipment Exercise tolerated well No report of concerns or symptoms today Strength training completed today  Goals Unmet:  Not Applicable  Comments: Pt able to follow exercise prescription today without complaint.  Will continue to monitor for progression.    Dr. Emily Filbert is Medical Director for [place].  Dr. Ottie Glazier is Medical Director for [facility] Pulmonary Rehabilitation.

## 2021-09-16 ENCOUNTER — Other Ambulatory Visit: Payer: Self-pay

## 2021-09-16 DIAGNOSIS — I5022 Chronic systolic (congestive) heart failure: Secondary | ICD-10-CM | POA: Diagnosis not present

## 2021-09-16 NOTE — Progress Notes (Signed)
Daily Session Note  Patient Details  Name: Mandy Jones MRN: [id number] Date of Birth: 1950/10/01 Referring Provider:   Flowsheet Row Pulmonary Rehab from 07/08/2021 in [facility] Cardiac and Pulmonary Rehab  Referring Provider Serafina Royals MD       Encounter Date: 09/16/2021  Check In:  Session Check In - 09/16/21 1024       Check-In   Supervising physician immediately available to respond to emergencies See telemetry face sheet for immediately available ER MD    Location [facility]-Cardiac & Pulmonary Rehab    Staff Present Birdie Sons, MPA, RN;Amanda Sommer, BA, ACSM CEP, Exercise Physiologist;Jessica [place], MA, RCEP, CCRP, CCET    Virtual Visit No    Medication changes reported     No    Fall or balance concerns reported    No    Tobacco Cessation No Change    Warm-up and Cool-down Performed on first and last piece of equipment    Resistance Training Performed Yes    VAD Patient? No    PAD/SET Patient? No      Pain Assessment   Currently in Pain? No/denies                Social History   Tobacco Use  Smoking Status Former  Smokeless Tobacco Never  Tobacco Comments   quit 30 years ago    Goals Met:  Independence with exercise equipment Exercise tolerated well No report of concerns or symptoms today Strength training completed today  Goals Unmet:  Not Applicable  Comments: Pt able to follow exercise prescription today without complaint.  Will continue to monitor for progression.    Dr. Emily Filbert is Medical Director for [place].  Dr. Ottie Glazier is Medical Director for [facility] Pulmonary Rehabilitation.

## 2021-09-22 ENCOUNTER — Encounter: Payer: Self-pay | Admitting: *Deleted

## 2021-09-22 DIAGNOSIS — I5022 Chronic systolic (congestive) heart failure: Secondary | ICD-10-CM

## 2021-09-22 NOTE — Progress Notes (Signed)
Pulmonary Individual Treatment Plan  Patient Details  Name: Debbi Strandberg MRN: [id number] Date of Birth: 25-May-1951 Referring Provider:   Flowsheet Row Pulmonary Rehab from 07/08/2021 in [facility] Cardiac and Pulmonary Rehab  Referring Provider Serafina Royals MD       Initial Encounter Date:  Flowsheet Row Pulmonary Rehab from 07/08/2021 in [facility] Cardiac and Pulmonary Rehab  Date 07/08/21       Visit Diagnosis: Heart failure, chronic systolic ([place])  Patient's Home Medications on Admission:  Current Outpatient Medications:    apixaban (ELIQUIS) 5 MG TABS tablet, Take 5 mg by mouth every 12 (twelve) hours., Disp: 60 tablet, Rfl: 0   Ascorbic Acid (VITAMIN C) 1000 MG tablet, Take 1,000 mg by mouth every evening., Disp: , Rfl:    Ashwagandha 500 MG CAPS, Take 1,000 mg by mouth daily. (Patient not taking: Reported on 09/07/2021), Disp: , Rfl:    B Complex-C (SUPER B COMPLEX PO), Take 1 tablet by mouth daily., Disp: , Rfl:    Boswellia-Glucosamine-Vit D (OSTEO BI-FLEX ONE PER DAY) TABS, Take 1 tablet by mouth daily., Disp: , Rfl:    Calcium-Magnesium-Vitamin D (CALCIUM MAGNESIUM PO), Take 1 tablet by mouth daily., Disp: , Rfl:    Cholecalciferol (VITAMIN D3) 50 MCG (2000 UT) TABS, Take 2,000 Units by mouth daily., Disp: , Rfl:    Coenzyme Q10 (CO Q 10) 100 MG CAPS, Take 100 mg by mouth daily., Disp: , Rfl:    empagliflozin (JARDIANCE) 10 MG TABS tablet, Take 1 tablet (10 mg total) by mouth daily before breakfast., Disp: 30 tablet, Rfl: 5   famotidine (PEPCID) 20 MG tablet, Take 40 mg by mouth at bedtime., Disp: , Rfl:    levothyroxine (SYNTHROID) 150 MCG tablet, Take 150 mcg by mouth daily before breakfast., Disp: , Rfl:    melatonin 5 MG TABS, Take 5 mg by mouth at bedtime as needed (sleep)., Disp: , Rfl:    Multiple Vitamins-Minerals (PRESERVISION AREDS 2 PO), Take 1 tablet by mouth daily., Disp: , Rfl:    potassium chloride (KLOR-CON) 10 MEQ tablet, Take 20 mEq by mouth daily.,  Disp: , Rfl:    Red Yeast Rice Extract 600 MG CAPS, Take 600 mg by mouth every evening., Disp: , Rfl:    sacubitril-valsartan (ENTRESTO) 24-26 MG, Take 1 tablet by mouth 2 (two) times daily., Disp: 60 tablet, Rfl: 5   spironolactone (ALDACTONE) 25 MG tablet, TAKE 1 TABLET (25 MG TOTAL) BY MOUTH DAILY., Disp: 90 tablet, Rfl: 3   torsemide (DEMADEX) 100 MG tablet, Take 0.5 tablets (50 mg total) by mouth daily., Disp: 30 tablet, Rfl: 3   traZODone (DESYREL) 50 MG tablet, Take 50-150 mg by mouth at bedtime as needed for sleep., Disp: , Rfl: 1   valACYclovir (VALTREX) 1000 MG tablet, Take 2,000 mg by mouth daily as needed (For breakout)., Disp: , Rfl:    XIIDRA 5 % SOLN, Apply 1 drop to eye 2 (two) times daily., Disp: , Rfl:   Past Medical History: Past Medical History:  Diagnosis Date   Arrhythmia    atrial fibrillation   Arthritis    CHF (congestive heart failure) (HCC)    Depression    Dyspnea    Dysrhythmia    Fibromyalgia 1992   GERD (gastroesophageal reflux disease)    Hepatitis    Hepatitis B 1985   Hyperlipidemia    Hypertension    Mitral valve prolapse    Pacemaker    Pacemaker 01/13/2021   Sleep apnea  CPAP   Tendon tear, ankle    rt foot   Thyroid dysfunction     Tobacco Use: Social History   Tobacco Use  Smoking Status Former  Smokeless Tobacco Never  Tobacco Comments   quit 30 years ago    Labs: Recent Review Flowsheet Data   There is no flowsheet data to display.      Pulmonary Assessment Scores:  Pulmonary Assessment Scores     Row Name 07/08/21 1125         ADL UCSD   ADL Phase Entry     SOB Score total 63     Rest 4     Walk 5     Stairs 5     Bath 1     Dress 1     Shop 2       CAT Score   CAT Score 17       mMRC Score   mMRC Score 3              UCSD: Self-administered rating of dyspnea associated with activities of daily living (ADLs) 6-point scale (0 = "not at all" to 5 = "maximal or unable to do because of  breathlessness")  Scoring Scores range from 0 to 120.  Minimally important difference is 5 units  CAT: CAT can identify the health impairment of COPD patients and is better correlated with disease progression.  CAT has a scoring range of zero to 40. The CAT score is classified into four groups of low (less than 10), medium (10 - 20), high (21-30) and very high (31-40) based on the impact level of disease on health status. A CAT score over 10 suggests significant symptoms.  A worsening CAT score could be explained by an exacerbation, poor medication adherence, poor inhaler technique, or progression of COPD or comorbid conditions.  CAT MCID is 2 points  mMRC: mMRC (Modified Medical Research Council) Dyspnea Scale is used to assess the degree of baseline functional disability in patients of respiratory disease due to dyspnea. No minimal important difference is established. A decrease in score of 1 point or greater is considered a positive change.   Pulmonary Function Assessment:   Exercise Target Goals: Exercise Program Goal: Individual exercise prescription set using results from initial 6 min walk test and THRR while considering  patients activity barriers and safety.   Exercise Prescription Goal: Initial exercise prescription builds to 30-45 minutes a day of aerobic activity, 2-3 days per week.  Home exercise guidelines will be given to patient during program as part of exercise prescription that the participant will acknowledge.  Education: Aerobic Exercise: - Group verbal and visual presentation on the components of exercise prescription. Introduces F.I.T.T principle from ACSM for exercise prescriptions.  Reviews F.I.T.T. principles of aerobic exercise including progression. Written material given at graduation. Flowsheet Row Pulmonary Rehab from 09/16/2021 in [facility] Cardiac and Pulmonary Rehab  Education need identified 07/08/21  Date 07/14/21  Educator [place]  Instruction Review Code 1-  [employer] Understanding       Education: Resistance Exercise: - Group verbal and visual presentation on the components of exercise prescription. Introduces F.I.T.T principle from ACSM for exercise prescriptions  Reviews F.I.T.T. principles of resistance exercise including progression. Written material given at graduation. Flowsheet Row Pulmonary Rehab from 09/16/2021 in [facility] Cardiac and Pulmonary Rehab  Date 09/02/21  Educator [facility]  Instruction Review Code 1- Verbalizes Understanding        Education: Exercise & Equipment Safety: - Individual verbal  instruction and demonstration of equipment use and safety with use of the equipment. Flowsheet Row Pulmonary Rehab from 09/16/2021 in [facility] Cardiac and Pulmonary Rehab  Education need identified 07/08/21  Date 07/08/21  Educator [place]  Instruction Review Code 1- Verbalizes Understanding       Education: Exercise Physiology & General Exercise Guidelines: - Group verbal and written instruction with models to review the exercise physiology of the cardiovascular system and associated critical values. Provides general exercise guidelines with specific guidelines to those with heart or lung disease.    Education: Flexibility, Balance, Mind/Body Relaxation: - Group verbal and visual presentation with interactive activity on the components of exercise prescription. Introduces F.I.T.T principle from ACSM for exercise prescriptions. Reviews F.I.T.T. principles of flexibility and balance exercise training including progression. Also discusses the mind body connection.  Reviews various relaxation techniques to help reduce and manage stress (i.e. Deep breathing, progressive muscle relaxation, and visualization). Balance handout provided to take home. Written material given at graduation. Flowsheet Row Pulmonary Rehab from 09/16/2021 in [facility] Cardiac and Pulmonary Rehab  Date 09/09/21  Educator [place]  Instruction Review Code 1- Verbalizes Understanding        Activity Barriers & Risk Stratification:  Activity Barriers & Cardiac Risk Stratification - 07/08/21 1121       Activity Barriers & Cardiac Risk Stratification   Activity Barriers Left Knee Replacement;Right Knee Replacement;Balance Concerns;Arthritis;Shortness of Breath;Assistive Device;Deconditioning;Muscular Weakness;Other (comment)    Comments Bursitis in left shoulder             6 Minute Walk:  6 Minute Walk     Row Name 07/08/21 1130         6 Minute Walk   Phase Initial     Distance 875 feet     Walk Time 6 minutes     # of Rest Breaks 0     MPH 1.65     METS 1.16     RPE 12     Perceived Dyspnea  2     VO2 Peak 4.07     Symptoms Yes (comment)     Comments Felt unsteady on feet     Resting HR 79 bpm     Resting BP 104/64     Resting Oxygen Saturation  97 %     Exercise Oxygen Saturation  during 6 min walk 96 %     Max Ex. HR 105 bpm     Max Ex. BP 112/66     2 Minute Post BP 102/66       Interval HR   1 Minute HR 99     2 Minute HR 102     3 Minute HR 105     4 Minute HR 102     5 Minute HR 103     6 Minute HR 101     2 Minute Post HR 87     Interval Heart Rate? Yes       Interval Oxygen   Interval Oxygen? Yes     Baseline Oxygen Saturation % 97 %     1 Minute Oxygen Saturation % 96 %     1 Minute Liters of Oxygen 0 L  RA     2 Minute Oxygen Saturation % 97 %     2 Minute Liters of Oxygen 0 L     3 Minute Oxygen Saturation % 96 %     3 Minute Liters of Oxygen 0 L     4 Minute Oxygen  Saturation % 96 %     4 Minute Liters of Oxygen 0 L     5 Minute Oxygen Saturation % 97 %     5 Minute Liters of Oxygen 0 L     6 Minute Oxygen Saturation % 96 %     6 Minute Liters of Oxygen 0 L     2 Minute Post Oxygen Saturation % 98 %     2 Minute Post Liters of Oxygen 0 L             Oxygen Initial Assessment:  Oxygen Initial Assessment - 07/08/21 1124       Home Oxygen   Home Oxygen Device None    Sleep Oxygen Prescription CPAP    Home  Exercise Oxygen Prescription None    Home Resting Oxygen Prescription None    Compliance with Home Oxygen Use Yes      Initial 6 min Walk   Oxygen Used None      Program Oxygen Prescription   Program Oxygen Prescription None      Intervention   Short Term Goals To learn and exhibit compliance with exercise, home and travel O2 prescription;To learn and understand importance of monitoring SPO2 with pulse oximeter and demonstrate accurate use of the pulse oximeter.;To learn and understand importance of maintaining oxygen saturations>88%;To learn and demonstrate proper pursed lip breathing techniques or other breathing techniques. ;To learn and demonstrate proper use of respiratory medications    Long  Term Goals Exhibits compliance with exercise, home  and travel O2 prescription;Maintenance of O2 saturations>88%;Exhibits proper breathing techniques, such as pursed lip breathing or other method taught during program session;Verbalizes importance of monitoring SPO2 with pulse oximeter and return demonstration;Compliance with respiratory medication             Oxygen Re-Evaluation:  Oxygen Re-Evaluation     Row Name 07/14/21 1535 09/07/21 1139           Program Oxygen Prescription   Program Oxygen Prescription None None        Home Oxygen   Home Oxygen Device None None      Sleep Oxygen Prescription CPAP CPAP      Home Exercise Oxygen Prescription None None      Home Resting Oxygen Prescription None None      Compliance with Home Oxygen Use Yes --        Goals/Expected Outcomes   Short Term Goals To learn and exhibit compliance with exercise, home and travel O2 prescription;To learn and understand importance of monitoring SPO2 with pulse oximeter and demonstrate accurate use of the pulse oximeter.;To learn and understand importance of maintaining oxygen saturations>88%;To learn and demonstrate proper pursed lip breathing techniques or other breathing techniques. ;To learn and  demonstrate proper use of respiratory medications To learn and exhibit compliance with exercise, home and travel O2 prescription;To learn and understand importance of monitoring SPO2 with pulse oximeter and demonstrate accurate use of the pulse oximeter.;To learn and understand importance of maintaining oxygen saturations>88%;To learn and demonstrate proper pursed lip breathing techniques or other breathing techniques. ;To learn and demonstrate proper use of respiratory medications      Long  Term Goals Exhibits compliance with exercise, home  and travel O2 prescription;Maintenance of O2 saturations>88%;Exhibits proper breathing techniques, such as pursed lip breathing or other method taught during program session;Verbalizes importance of monitoring SPO2 with pulse oximeter and return demonstration;Compliance with respiratory medication Exhibits compliance with exercise, home  and travel O2 prescription;Maintenance of O2  saturations>88%;Exhibits proper breathing techniques, such as pursed lip breathing or other method taught during program session;Verbalizes importance of monitoring SPO2 with pulse oximeter and return demonstration;Compliance with respiratory medication      Comments Reviewed PLB technique with pt.  Talked about how it works and it's importance in maintaining their exercise saturations. Jailey uses her CPAP most nights but can tell a difference when she doesnt.  She has a visit at the HF clinic today.      Goals/Expected Outcomes Short: Become more profiecient at using PLB.   Long: Become independent at using PLB. Short: be consistent using CPAP Long: use PLB as needed               Oxygen Discharge (Final Oxygen Re-Evaluation):  Oxygen Re-Evaluation - 09/07/21 1139       Program Oxygen Prescription   Program Oxygen Prescription None      Home Oxygen   Home Oxygen Device None    Sleep Oxygen Prescription CPAP    Home Exercise Oxygen Prescription None    Home Resting Oxygen  Prescription None      Goals/Expected Outcomes   Short Term Goals To learn and exhibit compliance with exercise, home and travel O2 prescription;To learn and understand importance of monitoring SPO2 with pulse oximeter and demonstrate accurate use of the pulse oximeter.;To learn and understand importance of maintaining oxygen saturations>88%;To learn and demonstrate proper pursed lip breathing techniques or other breathing techniques. ;To learn and demonstrate proper use of respiratory medications    Long  Term Goals Exhibits compliance with exercise, home  and travel O2 prescription;Maintenance of O2 saturations>88%;Exhibits proper breathing techniques, such as pursed lip breathing or other method taught during program session;Verbalizes importance of monitoring SPO2 with pulse oximeter and return demonstration;Compliance with respiratory medication    Comments Charleigh uses her CPAP most nights but can tell a difference when she doesnt.  She has a visit at the HF clinic today.    Goals/Expected Outcomes Short: be consistent using CPAP Long: use PLB as needed             Initial Exercise Prescription:  Initial Exercise Prescription - 07/08/21 1300       Date of Initial Exercise RX and Referring Provider   Date 07/08/21    Referring Provider Serafina Royals MD      Oxygen   Maintain Oxygen Saturation 88% or higher      Recumbant Bike   Level 1    RPM 60    Watts 15    Minutes 15    METs 1.1      NuStep   Level 1    SPM 80    Minutes 15    METs 1.1      Track   Laps 16    Minutes 15    METs 1.87      Prescription Details   Frequency (times per week) 2    Duration Progress to 30 minutes of continuous aerobic without signs/symptoms of physical distress      Intensity   THRR 40-80% of Max Heartrate 106-134    Ratings of Perceived Exertion 11-13    Perceived Dyspnea 0-4      Progression   Progression Continue to progress workloads to maintain intensity without  signs/symptoms of physical distress.      Resistance Training   Training Prescription Yes    Weight 3 lb    Reps 10-15  Perform Capillary Blood Glucose checks as needed.  Exercise Prescription Changes:   Exercise Prescription Changes     Row Name 07/08/21 1300 07/26/21 1400 08/09/21 1000 09/06/21 1100 09/20/21 1200     Response to Exercise   Blood Pressure (Admit) 104/64 120/64 110/60 128/68 116/60   Blood Pressure (Exercise) 112/66 120/66 114/64 106/60 --   Blood Pressure (Exit) 102/66 104/64 106/64 110/60 118/64   Heart Rate (Admit) 79 bpm 91 bpm 94 bpm 88 bpm 84 bpm   Heart Rate (Exercise) 105 bpm 100 bpm 109 bpm 105 bpm 114 bpm   Heart Rate (Exit) 85 bpm 89 bpm 94 bpm 90 bpm 104 bpm   Oxygen Saturation (Admit) 97 % 97 % 97 % 98 % 98 %   Oxygen Saturation (Exercise) 96 % 95 % 95 % 96 % 96 %   Oxygen Saturation (Exit) 98 % 96 % 96 % 96 % 96 %   Rating of Perceived Exertion (Exercise) _0 Perceived Dyspnea (Exercise) 2 -- -- 3 3   Symptoms Unsteady on feet -- none SOB SOB   Comments walk test results third day -- -- --   Duration -- Progress to 30 minutes of  aerobic without signs/symptoms of physical distress Continue with 30 min of aerobic exercise without signs/symptoms of physical distress. Continue with 30 min of aerobic exercise without signs/symptoms of physical distress. Continue with 30 min of aerobic exercise without signs/symptoms of physical distress.   Intensity -- THRR unchanged THRR unchanged THRR unchanged THRR unchanged     Progression   Progression -- Continue to progress workloads to maintain intensity without signs/symptoms of physical distress. Continue to progress workloads to maintain intensity without signs/symptoms of physical distress. Continue to progress workloads to maintain intensity without signs/symptoms of physical distress. Continue to progress workloads to maintain intensity without signs/symptoms of physical distress.    Average METs -- 1.85 1.94 1.62 1.94     Resistance Training   Training Prescription -- Yes Yes Yes Yes   Weight -- 3 lb 3 lb 3 lb 3 lb   Reps -- 10-15 10-15 10-15 10-15     Interval Training   Interval Training -- -- No No No     NuStep   Level -- _1 SPM -- 80 -- -- --   Minutes -- _2 METs -- -- 2.1 1.9 2.2     REL-XR   Level -- -- -- 1 3   Minutes -- -- -- 15 15   METs -- -- -- 2.1 2     T5 Nustep   Level -- -- 1 -- --   Minutes -- -- 15 -- --   METs -- -- 1.9 -- --     Biostep-RELP   Level -- -- -- 1 4   Minutes -- -- -- 15 15   METs -- -- -- 1 2     Track   Laps -- _3 Minutes -- _4 METs -- 1.27 1.82 1.5 1.54     Home Exercise Plan   Plans to continue exercise at -- -- -- -- Home (comment)  walking   Frequency -- -- -- -- Add 1 additional day to program exercise sessions.   Initial Home Exercises Provided -- -- -- -- 09/07/21     Oxygen   Maintain Oxygen Saturation -- -- 88% or higher  88% or higher 88% or higher            Exercise Comments:   Exercise Goals and Review:   Exercise Goals     Row Name 07/08/21 1358             Exercise Goals   Increase Physical Activity Yes       Intervention Provide advice, education, support and counseling about physical activity/exercise needs.;Develop an individualized exercise prescription for aerobic and resistive training based on initial evaluation findings, risk stratification, comorbidities and participant's personal goals.       Expected Outcomes Short Term: Attend rehab on a regular basis to increase amount of physical activity.;Long Term: Add in home exercise to make exercise part of routine and to increase amount of physical activity.;Long Term: Exercising regularly at least 3-5 days a week.       Increase Strength and Stamina Yes       Intervention Provide advice, education, support and counseling about physical activity/exercise needs.;Develop an  individualized exercise prescription for aerobic and resistive training based on initial evaluation findings, risk stratification, comorbidities and participant's personal goals.       Expected Outcomes Short Term: Increase workloads from initial exercise prescription for resistance, speed, and METs.;Short Term: Perform resistance training exercises routinely during rehab and add in resistance training at home;Long Term: Improve cardiorespiratory fitness, muscular endurance and strength as measured by increased METs and functional capacity (6MWT)       Able to understand and use rate of perceived exertion (RPE) scale Yes       Intervention Provide education and explanation on how to use RPE scale       Expected Outcomes Short Term: Able to use RPE daily in rehab to express subjective intensity level;Long Term:  Able to use RPE to guide intensity level when exercising independently       Able to understand and use Dyspnea scale Yes       Intervention Provide education and explanation on how to use Dyspnea scale       Expected Outcomes Short Term: Able to use Dyspnea scale daily in rehab to express subjective sense of shortness of breath during exertion;Long Term: Able to use Dyspnea scale to guide intensity level when exercising independently       Knowledge and understanding of Target Heart Rate Range (THRR) Yes       Intervention Provide education and explanation of THRR including how the numbers were predicted and where they are located for reference       Expected Outcomes Short Term: Able to state/look up THRR;Long Term: Able to use THRR to govern intensity when exercising independently;Short Term: Able to use daily as guideline for intensity in rehab       Able to check pulse independently Yes       Intervention Provide education and demonstration on how to check pulse in carotid and radial arteries.;Review the importance of being able to check your own pulse for safety during independent exercise        Expected Outcomes Short Term: Able to explain why pulse checking is important during independent exercise;Long Term: Able to check pulse independently and accurately       Understanding of Exercise Prescription Yes       Intervention Provide education, explanation, and written materials on patient's individual exercise prescription       Expected Outcomes Short Term: Able to explain program exercise prescription;Long Term: Able to explain home exercise prescription to exercise  independently                Exercise Goals Re-Evaluation :  Exercise Goals Re-Evaluation     [place] Name 07/14/21 1538 07/26/21 1501 08/09/21 1022 09/06/21 1057 09/07/21 1126     Exercise Goal Re-Evaluation   Exercise Goals Review Increase Physical Activity;Able to understand and use rate of perceived exertion (RPE) scale;Knowledge and understanding of Target Heart Rate Range (THRR);Understanding of Exercise Prescription;Increase Strength and Stamina;Able to check pulse independently;Able to understand and use Dyspnea scale Increase Physical Activity;Increase Strength and Stamina Increase Physical Activity;Increase Strength and Stamina Increase Physical Activity;Increase Strength and Stamina;Understanding of Exercise Prescription Increase Physical Activity;Increase Strength and Stamina   Comments Reviewed RPE and dyspnea scales, THR and program prescription with pt today.  Pt voiced understanding and was given a copy of goals to take home. Annai has tolerated exercise well in her first sessions. Oxygen has stayed in mid 90s during all sessions. Jannifer is doing well in rehab. Her attendance has not been consistent, however, she is switching class times which should help her stay consistent. She has increased to level 2 on the T4 Nustep and continues to walk the track. She does hit her [place] when walking. Will continue to monitor progression once her attendance is better. Alis is doing well in rehab.  She is now walking some in  class for 5 laps.  She has also done 30 min on the NuStep.  We will continue to montior her progress. Alzora is doing some exercise for legs   Expected Outcomes Short: Use RPE daily to regulate intensity. Long: Follow program prescription in THR. Short: attend consistently Long: build overall stamina Short: Maintain consistent attendance Long: Increase overall strength and MET level Short: Continue to attend regularly Long: Continue to improve stamina --    Row Name 09/07/21 1138 09/20/21 1216           Exercise Goal Re-Evaluation   Exercise Goals Review -- Increase Physical Activity;Increase Strength and Stamina;Understanding of Exercise Prescription      Comments Reviewed home exercise with pt today.  Pt plans to use staff videos and consider joining the [facility] for exercise.  Reviewed THR, pulse, RPE, sign and symptoms, pulse oximetery and when to call 911 or MD.  Also discussed weather considerations and indoor options.  Pt voiced understanding. Ketara is doing well in rehab.  She is now walking more and up to 10 laps.  She has also moved up the XR and BioStep.  We will continue to monitor her progress.      Expected Outcomes Short: add one day in addition to program sessions Long:  maintain exercise independently Short: Continue to add in more time walking Long: Continue to improve stamina               Discharge Exercise Prescription (Final Exercise Prescription Changes):  Exercise Prescription Changes - 09/20/21 1200       Response to Exercise   Blood Pressure (Admit) 116/60    Blood Pressure (Exit) 118/64    Heart Rate (Admit) 84 bpm    Heart Rate (Exercise) 114 bpm    Heart Rate (Exit) 104 bpm    Oxygen Saturation (Admit) 98 %    Oxygen Saturation (Exercise) 96 %    Oxygen Saturation (Exit) 96 %    Rating of Perceived Exertion (Exercise) 15    Perceived Dyspnea (Exercise) 3    Symptoms SOB    Duration Continue with 30 min of aerobic exercise  without signs/symptoms of physical  distress.    Intensity THRR unchanged      Progression   Progression Continue to progress workloads to maintain intensity without signs/symptoms of physical distress.    Average METs 1.94      Resistance Training   Training Prescription Yes    Weight 3 lb    Reps 10-15      Interval Training   Interval Training No      NuStep   Level 1    Minutes 15    METs 2.2      REL-XR   Level 3    Minutes 15    METs 2      Biostep-RELP   Level 4    Minutes 15    METs 2      Track   Laps 10    Minutes 15    METs 1.54      Home Exercise Plan   Plans to continue exercise at Home (comment)   walking   Frequency Add 1 additional day to program exercise sessions.    Initial Home Exercises Provided 09/07/21      Oxygen   Maintain Oxygen Saturation 88% or higher             Nutrition:  Target Goals: Understanding of nutrition guidelines, daily intake of sodium <1518m, cholesterol <202m calories 30% from fat and 7% or less from saturated fats, daily to have 5 or more servings of fruits and vegetables.  Education: All About Nutrition: -Group instruction provided by verbal, written material, interactive activities, discussions, models, and posters to present general guidelines for heart healthy nutrition including fat, fiber, MyPlate, the role of sodium in heart healthy nutrition, utilization of the nutrition label, and utilization of this knowledge for meal planning. Follow up email sent as well. Written material given at graduation. [facility]  Education need identified 07/08/21       Biometrics:  Pre Biometrics - 07/08/21 1122       Pre Biometrics   Height 5' 2.25" (1.581 m)    Weight 227 lb 14.4 oz (103.4 kg)    BMI (Calculated) 41.36    Single Leg Stand 8.94 seconds              Nutrition Therapy Plan and Nutrition Goals:  Nutrition Therapy & Goals - 07/29/21 1333       Nutrition  Therapy   Diet Heart healthy, low Na    Protein (specify units) 85g    Fiber 25 grams    Whole Grain Foods 3 servings    Saturated Fats 12 max. grams    Fruits and Vegetables 8 servings/day    Sodium 1.5 grams      Personal Nutrition Goals   Nutrition Goal ST: calculate sodium intake for 3 random days, use <1/4 tsp salt when cooking LT: limit Na < 1.5g/day    Comments B: something small - whole grain cereal or eggs/peppers/potatoes L: "hit or miss" every other day. leftovers mostly or w[employer] if she is on the road (grilled chicken sandwich) - 2x/week D: mostly vegetables (seasonally, colorful, salads, sometimes sweet potatoes, all squashes) with small amount of protein - size of her palm (chicken, pork, fish - sea bass, salmon, tuna, shrimp) as well as ancient grains. She makes a lot of stirfries she uses glass noodles. And will sometimes have spaghetti. She uses olive oil and some butter. She salts her food  sometimes while cooking (max 1/2 tsp) and salt-free seasonings. Drinks: 2-3 cups of coffee (Oatmilk cream and sweetener), water and seltzer, tea (black). She tries to not keep sugar in the house becuase she feels like it is a problem for her. Reviewed heart healthy eating and      Intervention Plan   Intervention Prescribe, educate and counsel regarding individualized specific dietary modifications aiming towards targeted core components such as weight, hypertension, lipid management, diabetes, heart failure and other comorbidities.;Nutrition handout(s) given to patient.    Expected Outcomes Short Term Goal: Understand basic principles of dietary content, such as calories, fat, sodium, cholesterol and nutrients.;Short Term Goal: A plan has been developed with personal nutrition goals set during dietitian appointment.;Long Term Goal: Adherence to prescribed nutrition plan.             Nutrition Assessments:  MEDIFICTS Score Key: ?70 Need to make dietary changes  40-70 Heart Healthy  Diet ? 40 Therapeutic Level Cholesterol Diet  Flowsheet Row Pulmonary Rehab from 07/08/2021 in [facility] Cardiac and Pulmonary Rehab  Picture Your Plate Total Score on Admission 65      Picture Your Plate Scores: <40 Unhealthy dietary pattern with much room for improvement. 41-50 Dietary pattern unlikely to meet recommendations for good health and room for improvement. 51-60 More healthful dietary pattern, with some room for improvement.  >60 Healthy dietary pattern, although there may be some specific behaviors that could be improved.   Nutrition Goals Re-Evaluation:  Nutrition Goals Re-Evaluation     [place] Name 09/07/21 1121             Goals   Comment Pricila reports not eating much sodium at all.  She doesnt eat pre packaged foods and prefers to cook at home.  She does follow Drs that recommend whole food plant based eating.       Expected Outcome Short: continue to watch sodium Long: maintain heart healthy diet                Nutrition Goals Discharge (Final Nutrition Goals Re-Evaluation):  Nutrition Goals Re-Evaluation - 09/07/21 1121       Goals   Comment Elizbeth reports not eating much sodium at all.  She doesnt eat pre packaged foods and prefers to cook at home.  She does follow Drs that recommend whole food plant based eating.    Expected Outcome Short: continue to watch sodium Long: maintain heart healthy diet             Psychosocial: Target Goals: Acknowledge presence or absence of significant depression and/or stress, maximize coping skills, provide positive support system. Participant is able to verbalize types and ability to use techniques and skills needed for reducing stress and depression.   Education: Stress, Anxiety, and Depression - Group verbal and visual presentation to define topics covered.  Reviews how body is impacted by stress, anxiety, and depression.  Also discusses healthy ways to reduce stress and to treat/manage anxiety and depression.  Written  material given at graduation.   Education: Sleep Hygiene -Provides group verbal and written instruction about how sleep can affect your health.  Define sleep hygiene, discuss sleep cycles and impact of sleep habits. Review good sleep hygiene tips.    Initial Review & Psychosocial Screening:  Initial Psych Review & Screening - 07/05/21 1413       Initial Review   Current issues with Current Sleep Concerns;Current Stress Concerns    Source of Stress Concerns Chronic Illness;Unable to participate in former  interests or hobbies;Unable to perform yard/household activities      [place]? No    Concerns No support system      Barriers   Psychosocial barriers to participate in program There are no identifiable barriers or psychosocial needs.      Screening Interventions   Interventions Encouraged to exercise;Provide feedback about the scores to participant;To provide support and resources with identified psychosocial needs    Expected Outcomes Short Term goal: Utilizing psychosocial counselor, staff and physician to assist with identification of specific Stressors or current issues interfering with healing process. Setting desired goal for each stressor or current issue identified.;Long Term Goal: Stressors or current issues are controlled or eliminated.;Short Term goal: Identification and review with participant of any Quality of Life or Depression concerns found by scoring the questionnaire.;Long Term goal: The participant improves quality of Life and PHQ9 Scores as seen by post scores and/or verbalization of changes             Quality of Life Scores:  Scores of 19 and below usually indicate a poorer quality of life in these areas.  A difference of  2-3 points is a clinically meaningful difference.  A difference of 2-3 points in the total score of the Quality of Life Index has been associated with significant improvement in overall quality of life,  self-image, physical symptoms, and general health in studies assessing change in quality of life.  PHQ-9: Recent Review Flowsheet Data     Depression screen [facility] 2/9 09/07/2021 08/31/2021 07/28/2021 07/08/2021 03/03/2021   Decreased Interest 1 1 0 1 0   Down, Depressed, Hopeless 3 2 0 2 1   PHQ - 2 Score 4 3 0 3 1   Altered sleeping 0 1 - 3 -   Tired, decreased energy 2 3 - 3 -   Change in appetite 0 2 - 2 -   Feeling bad or failure about yourself  2 2 - 1 -   Trouble concentrating 1 2 - 1 -   Moving slowly or fidgety/restless 0 2 - 1 -   Suicidal thoughts 0 0 - 0 -   PHQ-9 Score 9 15 - 14 -   Difficult doing work/chores Somewhat difficult Very difficult - Somewhat difficult -      Interpretation of Total Score  Total Score Depression Severity:  1-4 = Minimal depression, 5-9 = Mild depression, 10-14 = Moderate depression, 15-19 = Moderately severe depression, 20-27 = Severe depression   Psychosocial Evaluation and Intervention:  Psychosocial Evaluation - 07/05/21 1424       Psychosocial Evaluation & Interventions   Interventions Stress management education;Encouraged to exercise with the program and follow exercise prescription    Comments Krislynn reports her heart failure symptoms have been getting in the way of her daily life for the last 5 years. She feels like she can't participate in her hobbies or take her dogs places like she used to. Her mother passed away in the last two years and her only brother that is close by doesn't speak to her. [place] with her dogs, so she states she doesn't have a real support system. She was exercising regularly trying to lose weight but her heart failure symptoms have gotten in the way and now she is up to a weight she has never been before and is hating it. She wants to feel better and increase her stamina as well as lose  weight. She does have some vestibular issues post the second Covid vaccine that also causes some  balance issues.She takes medication to sleep but also takes a diuretic so she feels like she is constantly in the bathroom. She does have a copay so she may not be able to complete the total number of sessions, but she is very motivated to do whatever it takes to gain some control back in her life.    Expected Outcomes Short; attend Pulmonary Rehab for exercise and education. Long; develop and maintain positive self care habits.    Continue Psychosocial Services  Follow up required by staff             Psychosocial Re-Evaluation:  Psychosocial Re-Evaluation     [place] Name 08/31/21 1129             Psychosocial Re-Evaluation   Current issues with Current Stress Concerns;Current Anxiety/Panic;Current Depression       Comments Reviewed patient health questionnaire (PHQ-9) with patient for follow up. Previously, patients score indicated signs/symptoms of depression.  Reviewed to see if patient is improving symptom wise while in program.  Score declined and patient states that it is because they have continued to have depression symptoms. She has not been able to get a hold of a therapist that would take new pts/seniors.  We talked about trying to go through her PCP, but she is out on maternity leave; so recommended to try cardiologist. We also gave her our current list of therapist in the area.       Expected Outcomes Short: get set up with therapist and talk to doctor about symptoms Long: conitnue to exercise for mental boost.       Interventions Stress management education;Encouraged to attend Pulmonary Rehabilitation for the exercise       Continue Psychosocial Services  Follow up required by staff                Psychosocial Discharge (Final Psychosocial Re-Evaluation):  Psychosocial Re-Evaluation - 08/31/21 1129       Psychosocial Re-Evaluation   Current issues with Current Stress Concerns;Current Anxiety/Panic;Current Depression    Comments Reviewed patient health questionnaire  (PHQ-9) with patient for follow up. Previously, patients score indicated signs/symptoms of depression.  Reviewed to see if patient is improving symptom wise while in program.  Score declined and patient states that it is because they have continued to have depression symptoms. She has not been able to get a hold of a therapist that would take new pts/seniors.  We talked about trying to go through her PCP, but she is out on maternity leave; so recommended to try cardiologist. We also gave her our current list of therapist in the area.    Expected Outcomes Short: get set up with therapist and talk to doctor about symptoms Long: conitnue to exercise for mental boost.    Interventions Stress management education;Encouraged to attend Pulmonary Rehabilitation for the exercise    Continue Psychosocial Services  Follow up required by staff             Education: Education Goals: Education classes will be provided on a weekly basis, covering required topics. Participant will state understanding/return demonstration of topics presented.  Learning Barriers/Preferences:  Learning Barriers/Preferences - 07/05/21 1413       Learning Barriers/Preferences   Learning Barriers None    Learning Preferences None             General Pulmonary Education Topics:  Infection Prevention: -  Provides verbal and written material to individual with discussion of infection control including proper hand washing and proper equipment cleaning during exercise session. Flowsheet Row Pulmonary Rehab from 09/16/2021 in [facility] Cardiac and Pulmonary Rehab  Education need identified 07/08/21  Date 07/08/21  Educator Mentor  Instruction Review Code 1- Verbalizes Understanding       Falls Prevention: - Provides verbal and written material to individual with discussion of falls prevention and safety. Flowsheet Row Pulmonary Rehab from 09/16/2021 in [facility] Cardiac and Pulmonary Rehab  Education need identified 07/08/21  Date  07/08/21  Educator [place]  Instruction Review Code 1- Verbalizes Understanding       Chronic Lung Disease Review: - Group verbal instruction with posters, models, PowerPoint presentations and videos,  to review new updates, new respiratory medications, new advancements in procedures and treatments. Providing information on websites and "800" numbers for continued self-education. Includes information about supplement oxygen, available portable oxygen systems, continuous and intermittent flow rates, oxygen safety, concentrators, and Medicare reimbursement for oxygen. Explanation of Pulmonary Drugs, including class, frequency, complications, importance of spacers, rinsing mouth after steroid MDI's, and proper cleaning methods for nebulizers. Review of basic lung anatomy and physiology related to function, structure, and complications of lung disease. Review of risk factors. Discussion about methods for diagnosing sleep apnea and types of masks and machines for OSA. Includes a review of the use of types of environmental controls: home humidity, furnaces, filters, dust mite/pet prevention, HEPA vacuums. Discussion about weather changes, air quality and the benefits of nasal washing. Instruction on Warning signs, infection symptoms, calling MD promptly, preventive modes, and value of vaccinations. Review of effective airway clearance, coughing and/or vibration techniques. Emphasizing that all should Create an Action Plan. Written material given at graduation. Flowsheet Row Pulmonary Rehab from 09/16/2021 in [facility] Cardiac and Pulmonary Rehab  Date 08/04/21  Educator [facility]  Instruction Review Code 1- Verbalizes Understanding       AED/CPR: - Group verbal and written instruction with the use of models to demonstrate the basic use of the AED with the basic ABC's of resuscitation.    Anatomy and Cardiac Procedures: - Group verbal and visual presentation and models provide information about basic cardiac anatomy  and function. Reviews the testing methods done to diagnose heart disease and the outcomes of the test results. Describes the treatment choices: Medical Management, Angioplasty, or Coronary Bypass Surgery for treating various heart conditions including Myocardial Infarction, Angina, Valve Disease, and Cardiac Arrhythmias.  Written material given at graduation. Flowsheet Row Pulmonary Rehab from 09/16/2021 in [facility] Cardiac and Pulmonary Rehab  Date 09/02/21  Educator SB  Instruction Review Code 1- Verbalizes Understanding       Medication Safety: - Group verbal and visual instruction to review commonly prescribed medications for heart and lung disease. Reviews the medication, class of the drug, and side effects. Includes the steps to properly store meds and maintain the prescription regimen.  Written material given at graduation. Flowsheet Row Pulmonary Rehab from 09/16/2021 in [facility] Cardiac and Pulmonary Rehab  Date 09/16/21  Educator SB  Instruction Review Code 1- Verbalizes Understanding       Other: -Provides group and verbal instruction on various topics (see comments)   Knowledge Questionnaire Score:  Knowledge Questionnaire Score - 07/08/21 1120       Knowledge Questionnaire Score   Pre Score 23/26 (cardiac): Diabetes, Nutrition, Aerobic              Core Components/Risk Factors/Patient Goals at Admission:  Personal Goals and  Risk Factors at Admission - 07/08/21 1358       Core Components/Risk Factors/Patient Goals on Admission    Weight Management Yes;Weight Loss    Intervention Weight Management: Develop a combined nutrition and exercise program designed to reach desired caloric intake, while maintaining appropriate intake of nutrient and fiber, sodium and fats, and appropriate energy expenditure required for the weight goal.;Weight Management: Provide education and appropriate resources to help participant work on and attain dietary goals.;Obesity: Provide education and  appropriate resources to help participant work on and attain dietary goals.    Admit Weight 227 lb (103 kg)    Goal Weight: Short Term 222 lb (100.7 kg)    Goal Weight: Long Term 150 lb (68 kg)    Expected Outcomes Short Term: Continue to assess and modify interventions until short term weight is achieved;Long Term: Adherence to nutrition and physical activity/exercise program aimed toward attainment of established weight goal;Weight Loss: Understanding of general recommendations for a balanced deficit meal plan, which promotes 1-2 lb weight loss per week and includes a negative energy balance of [phone] kcal/d;Understanding recommendations for meals to include 15-35% energy as protein, 25-35% energy from fat, 35-60% energy from carbohydrates, less than 228m of dietary cholesterol, 20-35 gm of total fiber daily;Understanding of distribution of calorie intake throughout the day with the consumption of 4-5 meals/snacks    Heart Failure Yes    Intervention Provide a combined exercise and nutrition program that is supplemented with education, support and counseling about heart failure. Directed toward relieving symptoms such as shortness of breath, decreased exercise tolerance, and extremity edema.    Expected Outcomes Short term: Attendance in program 2-3 days a week with increased exercise capacity. Reported lower sodium intake. Reported increased fruit and vegetable intake. Reports medication compliance.;Improve functional capacity of life;Short term: Daily weights obtained and reported for increase. Utilizing diuretic protocols set by physician.;Long term: Adoption of self-care skills and reduction of barriers for early signs and symptoms recognition and intervention leading to self-care maintenance.    Hypertension Yes    Intervention Provide education on lifestyle modifcations including regular physical activity/exercise, weight management, moderate sodium restriction and increased consumption of fresh  fruit, vegetables, and low fat dairy, alcohol moderation, and smoking cessation.;Monitor prescription use compliance.    Expected Outcomes Short Term: Continued assessment and intervention until BP is < 140/9100mHG in hypertensive participants. < 130/8026mG in hypertensive participants with diabetes, heart failure or chronic kidney disease.;Long Term: Maintenance of blood pressure at goal levels.    Lipids Yes    Intervention Provide education and support for participant on nutrition & aerobic/resistive exercise along with prescribed medications to achieve LDL <58m65mDL >40mg62m Expected Outcomes Short Term: Participant states understanding of desired cholesterol values and is compliant with medications prescribed. Participant is following exercise prescription and nutrition guidelines.;Long Term: Cholesterol controlled with medications as prescribed, with individualized exercise RX and with personalized nutrition plan. Value goals: LDL < 58mg,57m > 40 mg.             Education:Diabetes - Individual verbal and written instruction to review signs/symptoms of diabetes, desired ranges of glucose level fasting, after meals and with exercise. Acknowledge that pre and post exercise glucose checks will be done for 3 sessions at entry of program.   Know Your Numbers and Heart Failure: - Group verbal and visual instruction to discuss disease risk factors for cardiac and pulmonary disease and treatment options.  Reviews associated critical values for Overweight/Obesity, Hypertension,  Cholesterol, and Diabetes.  Discusses basics of heart failure: signs/symptoms and treatments.  Introduces Heart Failure Zone chart for action plan for heart failure.  Written material given at graduation. Flowsheet Row Pulmonary Rehab from 09/16/2021 in [facility] Cardiac and Pulmonary Rehab  Education need identified 07/08/21       Core Components/Risk Factors/Patient Goals Review:   Goals and Risk Factor Review     Row  Name 09/07/21 1124             Core Components/Risk Factors/Patient Goals Review   Personal Goals Review Hypertension;Weight Management/Obesity       Review Gibson sees the heart failure clinic after class today.  She checks her BP occasionally at home.  It was 128/60 resting today.  She feels she holds a lot of fluid and spends a lof of time in the bathroom from taking fluid pills.  She does weigh daily.  It is going up.  She will follow up at thr HF clinic.       Expected Outcomes Short: contineu to monitor weight and BP Long: manage risk factors long term                Core Components/Risk Factors/Patient Goals at Discharge (Final Review):   Goals and Risk Factor Review - 09/07/21 1124       Core Components/Risk Factors/Patient Goals Review   Personal Goals Review Hypertension;Weight Management/Obesity    Review Terah sees the heart failure clinic after class today.  She checks her BP occasionally at home.  It was 128/60 resting today.  She feels she holds a lot of fluid and spends a lof of time in the bathroom from taking fluid pills.  She does weigh daily.  It is going up.  She will follow up at thr HF clinic.    Expected Outcomes Short: contineu to monitor weight and BP Long: manage risk factors long term             ITP Comments:  ITP Comments     Row Name 07/05/21 1434 07/08/21 1121 07/14/21 1534 07/28/21 0724 07/29/21 1444   ITP Comments Initial telephone orientation completed. Diagnosis can be found in [facility] 8/24. EP orientation scheduled for Thursday 11/10 at 9:30. Completed 6MWT and gym orientation. Initial ITP created and sent for review to Dr. Ottie Glazier, Medical Director. First full day of exercise!  Patient was oriented to gym and equipment including functions, settings, policies, and procedures.  Patient's individual exercise prescription and treatment plan were reviewed.  All starting workloads were established based on the results of the 6 minute walk test done  at initial orientation visit.  The plan for exercise progression was also introduced and progression will be customized based on patient's performance and goals. 30 Day review completed. Medical Director ITP review done, changes made as directed, and signed approval by Medical Director.   New to program Completed initial RD consultation    Row Name 08/17/21 1103 08/25/21 0744 09/22/21 0906       ITP Comments Analeigh has missed some sessions and is travelling for the holiday.  Staff were not able to speak with her about goals. 30 Day review completed. Medical Director ITP review done, changes made as directed, and signed approval by Medical Director. 30 Day review completed. Medical Director ITP review done, changes made as directed, and signed approval by Medical Director.              Comments:

## 2021-09-23 ENCOUNTER — Other Ambulatory Visit: Payer: Self-pay

## 2021-09-23 DIAGNOSIS — I5022 Chronic systolic (congestive) heart failure: Secondary | ICD-10-CM | POA: Diagnosis not present

## 2021-09-23 NOTE — Progress Notes (Signed)
Daily Session Note  Patient Details  Name: Mandy Jones MRN: [id number] Date of Birth: 24-Feb-1951 Referring Provider:   Flowsheet Row Pulmonary Rehab from 07/08/2021 in [facility] Cardiac and Pulmonary Rehab  Referring Provider Serafina Royals MD       Encounter Date: 09/23/2021  Check In:  Session Check In - 09/23/21 1107       Check-In   Supervising physician immediately available to respond to emergencies See telemetry face sheet for immediately available ER MD    Location [facility]-Cardiac & Pulmonary Rehab    Staff Present Birdie Sons, MPA, RN;Joseph Tessie Fass, RCP,RRT,BSRT;Amanda Oletta Darter, BA, ACSM CEP, Exercise Physiologist    Virtual Visit No    Medication changes reported     Yes    Comments added jardiance    Fall or balance concerns reported    No    Tobacco Cessation No Change    Warm-up and Cool-down Performed on first and last piece of equipment    Resistance Training Performed Yes    VAD Patient? No    PAD/SET Patient? No      Pain Assessment   Currently in Pain? No/denies                Social History   Tobacco Use  Smoking Status Former  Smokeless Tobacco Never  Tobacco Comments   quit 30 years ago    Goals Met:  Independence with exercise equipment Exercise tolerated well No report of concerns or symptoms today Strength training completed today  Goals Unmet:  Not Applicable  Comments: Pt able to follow exercise prescription today without complaint.  Will continue to monitor for progression.    Dr. Emily Filbert is Medical Director for [place].  Dr. Ottie Glazier is Medical Director for [facility] Pulmonary Rehabilitation.

## 2021-09-30 ENCOUNTER — Other Ambulatory Visit: Payer: Self-pay

## 2021-09-30 ENCOUNTER — Encounter: Payer: HMO | Attending: Internal Medicine

## 2021-09-30 DIAGNOSIS — I5022 Chronic systolic (congestive) heart failure: Secondary | ICD-10-CM | POA: Insufficient documentation

## 2021-09-30 NOTE — Progress Notes (Signed)
Daily Session Note  Patient Details  Name: Mandy Jones MRN: [id number] Date of Birth: 10/12/1950 Referring Provider:   Flowsheet Row Pulmonary Rehab from 07/08/2021 in [facility] Cardiac and Pulmonary Rehab  Referring Provider Serafina Royals MD       Encounter Date: 09/30/2021  Check In:  Session Check In - 09/30/21 1038       Check-In   Supervising physician immediately available to respond to emergencies See telemetry face sheet for immediately available ER MD    Location [facility]Card[facility] Pulmonary Rehab    Staff Present Birdie Sons, MPA, Nino Glow, MS, ASCM CEP, Exercise Physiologist;Amanda Oletta Darter, BA, ACSM CEP, Exercise Physiologist;Melissa Caiola, RDN, LDN    Virtual Visit No    Medication changes reported     No    Fall or balance concerns reported    No    Tobacco Cessation No Change    Warm-up and Cool-down Performed on first and last piece of equipment    Resistance Training Performed Yes    VAD Patient? No    PAD/SET Patient? No      Pain Assessment   Currently in Pain? No/denies                Social History   Tobacco Use  Smoking Status Former  Smokeless Tobacco Never  Tobacco Comments   quit 30 years ago    Goals Met:  Independence with exercise equipment Exercise tolerated well No report of concerns or symptoms today Strength training completed today  Goals Unmet:  Not Applicable  Comments: Pt able to follow exercise prescription today without complaint.  Will continue to monitor for progression.    Dr. Emily Filbert is Medical Director for [place].  Dr. Ottie Glazier is Medical Director for [facility] Pulmonary Rehabilitation.

## 2021-10-04 ENCOUNTER — Telehealth: Payer: Self-pay | Admitting: Family

## 2021-10-04 NOTE — Telephone Encounter (Signed)
Returned patients call as she left a voicemail stating she had concern about her blood pressure as it was 98/60. I called and lvm with patient that we were not concered with her blood pressure at this time and to keep her appointment for this coming Thursday unless she develops symptoms then to give [place] a call and we can get her in sooner.   Dylyn Mclaren, NT

## 2021-10-05 ENCOUNTER — Other Ambulatory Visit (HOSPITAL_COMMUNITY): Payer: Self-pay | Admitting: Neurology

## 2021-10-05 DIAGNOSIS — R2689 Other abnormalities of gait and mobility: Secondary | ICD-10-CM

## 2021-10-05 DIAGNOSIS — R42 Dizziness and giddiness: Secondary | ICD-10-CM

## 2021-10-06 NOTE — Progress Notes (Signed)
Patient ID: Mandy Jones, female    DOB: [birth year], 71 y.o.   MRN: [id number]  HPI  Mandy Jones is a 71 y/o female with a history of atrial fibrillation, HTN, thyroid disease, GERD, sleep apnea, depression, fibromyalgia, hyperlipidemia, previous tobacco use and chronic heart failure.   Echo report from 10/15/20 reviewed and showed an EF of 40%. Echo report from 02/10/20 reviewed and showed an EF of 55-60% along with mild LAE.   Has not been admitted or been in the ED in the last 6 months  She presents today for a follow-up visit with a chief complaint of minimal fatigue with moderate exertion. She describes this as chronic in nature having been present for several years. She has associated shortness of breath, abdominal distention, difficulty sleeping and chronic pain along with this. She denies any dizziness, palpitations, pedal edema, chest pain, cough or weight gain.   Tolerating jardiance without known side effects.   Past Medical History:  Diagnosis Date   Arrhythmia    atrial fibrillation   Arthritis    CHF (congestive heart failure) (HCC)    Depression    Dyspnea    Dysrhythmia    Fibromyalgia 1992   GERD (gastroesophageal reflux disease)    Hepatitis    Hepatitis B 1985   Hyperlipidemia    Hypertension    Mitral valve prolapse    Pacemaker    Pacemaker 01/13/2021   Sleep apnea    CPAP   Tendon tear, ankle    rt foot   Thyroid dysfunction    Past Surgical History:  Procedure Laterality Date   ACHILLES TENDON SURGERY Right 04/06/2018   Procedure: ACHILLES TENDON REPAIR;  Surgeon: [name], DPM;  Location: [facility] ORS;  Service: Podiatry;  Laterality: Right;   CARDIOVERSION N/A 02/06/2018   Procedure: CARDIOVERSION;  Surgeon: Corey Skains, MD;  Location: [facility] ORS;  Service: Cardiovascular;  Laterality: N/A;   CARDIOVERSION N/A 03/28/2018   Procedure: CARDIOVERSION;  Surgeon: Corey Skains, MD;  Location: [facility];  Service: Cardiovascular;   Laterality: N/A;   CARDIOVERSION N/A 05/09/2018   Procedure: CARDIOVERSION;  Surgeon: Corey Skains, MD;  Location: [facility];  Service: Cardiovascular;  Laterality: N/A;   CARDIOVERSION N/A 10/23/2019   Procedure: CARDIOVERSION;  Surgeon: Corey Skains, MD;  Location: [facility];  Service: Cardiovascular;  Laterality: N/A;   CARDIOVERSION N/A 09/30/2020   Procedure: CARDIOVERSION;  Surgeon: Corey Skains, MD;  Location: [facility];  Service: Cardiovascular;  Laterality: N/A;   CARDIOVERSION N/A 11/19/2020   Procedure: CARDIOVERSION;  Surgeon: Corey Skains, MD;  Location: [facility];  Service: Cardiovascular;  Laterality: N/A;   CARDIOVERSION N/A 03/16/2021   Procedure: CARDIOVERSION;  Surgeon: Corey Skains, MD;  Location: [facility];  Service: Cardiovascular;  Laterality: N/A;   CHOLECYSTECTOMY     CONTINUOUS NERVE MONITORING N/A 08/03/2017   Procedure: LARYNGEAL NERVE MONITORING;  Surgeon: Carloyn Manner, MD;  Location: [facility];  Service: ENT;  Laterality: N/A;   EXTRACORPOREAL SHOCK WAVE LITHOTRIPSY Right 02/13/2020   Procedure: EXTRACORPOREAL SHOCK WAVE LITHOTRIPSY (ESWL);  Surgeon: Billey Co, MD;  Location: [facility];  Service: Urology;  Laterality: Right;   EYE SURGERY Bilateral    FOOT SURGERY Left 2000   JOINT REPLACEMENT     left knee   OSTECTOMY Right 04/06/2018   Procedure: OSTECTOMY-HAGLUNDS/RECTROCALCANEAL;  Surgeon: [name], DPM;  Location: [facility];  Service: Podiatry;  Laterality: Right;   SHOULDER ARTHROSCOPY Bilateral    SHOULDER  ARTHROSCOPY Left    THYROIDECTOMY N/A 08/03/2017   Procedure: THYROIDECTOMY;  Surgeon: Carloyn Manner, MD;  Location: [facility];  Service: ENT;  Laterality: N/A;   TONSILLECTOMY     TUBAL LIGATION     Family History  Problem Relation Age of Onset   Hypertension Mother    Gastric cancer Father    Social History   Tobacco Use   Smoking status: Former   Smokeless tobacco: Never   Tobacco comments:    quit 30 years  ago  Substance Use Topics   Alcohol use: Yes    Comment: occassional   Allergies  Allergen Reactions   Silver Other (See Comments)     Causes blisters.   Tape Rash    Pads used for ablation left a rash for a couple of days.   Prior to Admission medications   Medication Sig Start Date End Date Taking? Authorizing Provider  apixaban (ELIQUIS) 5 MG TABS tablet Take 5 mg by mouth every 12 (twelve) hours. 02/14/20  Yes Val Riles, MD  Ascorbic Acid (VITAMIN C) 1000 MG tablet Take 1,000 mg by mouth every evening.   Yes [provider]  B Complex-C (SUPER B COMPLEX PO) Take 1 tablet by mouth daily.   Yes [provider]  Boswellia-Glucosamine-Vit D (OSTEO BI-FLEX ONE PER DAY) TABS Take 1 tablet by mouth daily.   Yes [provider]  Calcium-Magnesium-Vitamin D (CALCIUM MAGNESIUM PO) Take 1 tablet by mouth daily.   Yes [provider]  Cholecalciferol (VITAMIN D3) 50 MCG (2000 UT) TABS Take 2,000 Units by mouth daily.   Yes [provider]  Coenzyme Q10 (CO Q 10) 100 MG CAPS Take 100 mg by mouth daily.   Yes [provider]  empagliflozin (JARDIANCE) 10 MG TABS tablet Take 1 tablet (10 mg total) by mouth daily before breakfast. 09/07/21  Yes Darylene Price A, FNP  famotidine (PEPCID) 20 MG tablet Take 40 mg by mouth at bedtime. 10/09/19  Yes [provider]  levothyroxine (SYNTHROID) 150 MCG tablet Take 150 mcg by mouth daily before breakfast. 10/15/19  Yes [provider]  melatonin 5 MG TABS Take 5 mg by mouth at bedtime as needed (sleep).   Yes [provider]  Multiple Vitamins-Minerals (PRESERVISION AREDS 2 PO) Take 1 tablet by mouth daily.   Yes [provider]  potassium chloride (KLOR-CON) 10 MEQ tablet Take 20 mEq by mouth daily. 09/12/20  Yes [provider]  Red Yeast Rice Extract 600 MG CAPS Take 600 mg by mouth every evening.   Yes [provider]  sacubitril-valsartan (ENTRESTO)  24-26 MG Take 1 tablet by mouth 2 (two) times daily. 03/03/21  Yes Zayden Hahne, Otila Kluver A, FNP  spironolactone (ALDACTONE) 25 MG tablet TAKE 1 TABLET (25 MG TOTAL) BY MOUTH DAILY.  08/26/21 11/24/21 Yes Tylynn Braniff, Otila Kluver A, FNP  torsemide (DEMADEX) 100 MG tablet Take 0.5 tablets (50 mg total) by mouth daily. 07/28/21 10/26/21 Yes Janay Canan, Aura Fey, FNP  traZODone (DESYREL) 50 MG tablet Take 50-150 mg by mouth at bedtime as needed for sleep. 12/04/17  Yes [provider]  valACYclovir (VALTREX) 1000 MG tablet Take 2,000 mg by mouth daily as needed (For breakout). 11/23/20  Yes [provider]  XIIDRA 5 % SOLN Apply 1 drop to eye 2 (two) times daily. 05/25/21  Yes [provider]  Ashwagandha 500 MG CAPS Take 1,000 mg by mouth daily. Patient not taking: Reported on 09/07/2021    [provider]  Review of Systems  Constitutional:  Positive for fatigue. Negative for appetite change.  HENT:  Negative for congestion, postnasal drip and sore throat.   Eyes: Negative.   Respiratory:  Positive for shortness of breath (minimal). Negative for cough and chest tightness.   Cardiovascular:  Negative for chest pain, palpitations and leg swelling.  Gastrointestinal:  Positive for abdominal distention. Negative for abdominal pain.  Endocrine: Negative.   Musculoskeletal:  Positive for arthralgias (left shoulder) and myalgias (right thigh). Negative for back pain.  Skin:  Negative for rash.       Itching lower abdomen  Allergic/Immunologic: Negative.   Neurological:  Negative for dizziness and light-headedness.  Hematological:  Negative for adenopathy. Does not bruise/bleed easily.  Psychiatric/Behavioral:  Positive for sleep disturbance (interrupted sleep; sleeping on 1-2 pillows; wearing CPAP most nights). Negative for dysphoric mood. The patient is not nervous/anxious.    Vitals:   10/07/21 1222  BP: 109/73  Pulse: (!) 107  Resp: 18  SpO2: 95%  Weight: 230 lb 3 oz (104.4 kg)   Height: 5\' 2"  (1.575 m)   Wt Readings from Last 3 Encounters:  10/07/21 230 lb 3 oz (104.4 kg)  09/07/21 233 lb 2 oz (105.7 kg)  07/28/21 235 lb 3 oz (106.7 kg)   Lab Results  Component Value Date   CREATININE 0.75 10/07/2021   CREATININE 0.62 09/07/2021   CREATININE 0.74 04/21/2021   Physical Exam Vitals and nursing note reviewed.  Constitutional:      Appearance: Normal appearance.  HENT:     Head: Normocephalic and atraumatic.  Cardiovascular:     Rate and Rhythm: Tachycardia present. Rhythm irregular.  Pulmonary:     Effort: Pulmonary effort is normal. No respiratory distress.     Breath sounds: No wheezing or rales.  Abdominal:     General: There is no distension.     Palpations: Abdomen is soft.  Musculoskeletal:        General: No tenderness.     Cervical back: Normal range of motion and neck supple.     Right lower leg: No edema.     Left lower leg: No edema.  Skin:    General: Skin is warm and dry.  Neurological:     General: No focal deficit present.     Mental Status: She is alert and oriented to person, place, and time.  Psychiatric:        Mood and Affect: Mood normal.        Behavior: Behavior normal.        Thought Content: Thought content normal.   Assessment & Plan:  1: Chronic heart failure with reduced ejection fraction- - NYHA class II - euvolemic today - weighing daily; reminded to call for an overnight weight gain of >2 pounds or a weekly weight gain of >5 pounds - weight down 3 pounds from last visit here 1 month ago - not adding salt except to pasta water - saw cardiology Nehemiah Massed) 05/26/21 - on GDMT of entresto, jardiance & spironolactone - will check BMP today as jardiance started at last visit - will decrease spironolactone to 12.5mg  daily due to low home BP readings - jardiance patient assistance forms filled out today - consider adding beta-blocker especially if HR remains elevated - participating in pulmonary rehab - saw  pulmonology Raul Del) 07/15/21 - BNP 02/22/21 was 108.8  2: HTN- - BP looks good (109/73); home BP log reviewed and shows low readings; decreasing spironolactone per above - saw PCP Quentin Cornwall)  03/29/21 - BMP 09/07/21 reviewed and showed sodium 137, potassium 4.3, creatinine 0.62 and GFR >60  3: Atrial fibrillation- - previous ablation & cardioversion done  - on apixaban - saw EP Marcello Moores) 04/09/21   Patient did not bring her medications nor a list. Each medication was verbally reviewed with the patient and she was encouraged to bring the bottles to every visit to confirm accuracy of list.  Return in 1 month, sooner if needed.

## 2021-10-07 ENCOUNTER — Encounter: Payer: Self-pay | Admitting: Family

## 2021-10-07 ENCOUNTER — Ambulatory Visit: Payer: HMO | Attending: Family | Admitting: Family

## 2021-10-07 ENCOUNTER — Other Ambulatory Visit: Payer: Self-pay

## 2021-10-07 VITALS — BP 109/73 | HR 107 | Resp 18 | Ht 62.0 in | Wt 230.2 lb

## 2021-10-07 DIAGNOSIS — I5022 Chronic systolic (congestive) heart failure: Secondary | ICD-10-CM | POA: Diagnosis not present

## 2021-10-07 DIAGNOSIS — Z7901 Long term (current) use of anticoagulants: Secondary | ICD-10-CM | POA: Diagnosis not present

## 2021-10-07 DIAGNOSIS — I480 Paroxysmal atrial fibrillation: Secondary | ICD-10-CM | POA: Diagnosis not present

## 2021-10-07 DIAGNOSIS — Z79899 Other long term (current) drug therapy: Secondary | ICD-10-CM | POA: Diagnosis not present

## 2021-10-07 DIAGNOSIS — M797 Fibromyalgia: Secondary | ICD-10-CM | POA: Diagnosis not present

## 2021-10-07 DIAGNOSIS — E785 Hyperlipidemia, unspecified: Secondary | ICD-10-CM | POA: Diagnosis not present

## 2021-10-07 DIAGNOSIS — I4891 Unspecified atrial fibrillation: Secondary | ICD-10-CM | POA: Insufficient documentation

## 2021-10-07 DIAGNOSIS — F32A Depression, unspecified: Secondary | ICD-10-CM | POA: Diagnosis not present

## 2021-10-07 DIAGNOSIS — I10 Essential (primary) hypertension: Secondary | ICD-10-CM

## 2021-10-07 DIAGNOSIS — K219 Gastro-esophageal reflux disease without esophagitis: Secondary | ICD-10-CM | POA: Insufficient documentation

## 2021-10-07 DIAGNOSIS — G8929 Other chronic pain: Secondary | ICD-10-CM | POA: Insufficient documentation

## 2021-10-07 DIAGNOSIS — I110 Hypertensive heart disease with heart failure: Secondary | ICD-10-CM | POA: Diagnosis not present

## 2021-10-07 DIAGNOSIS — Z87891 Personal history of nicotine dependence: Secondary | ICD-10-CM | POA: Diagnosis not present

## 2021-10-07 DIAGNOSIS — G4730 Sleep apnea, unspecified: Secondary | ICD-10-CM | POA: Diagnosis not present

## 2021-10-07 DIAGNOSIS — E079 Disorder of thyroid, unspecified: Secondary | ICD-10-CM | POA: Diagnosis not present

## 2021-10-07 DIAGNOSIS — Z7984 Long term (current) use of oral hypoglycemic drugs: Secondary | ICD-10-CM | POA: Diagnosis not present

## 2021-10-07 LAB — BASIC METABOLIC PANEL
Anion gap: 8 (ref 5–15)
BUN: 25 mg/dL — ABNORMAL HIGH (ref 8–23)
CO2: 27 mmol/L (ref 22–32)
Calcium: 9.4 mg/dL (ref 8.9–10.3)
Chloride: 102 mmol/L (ref 98–111)
Creatinine, Ser: 0.75 mg/dL (ref 0.44–1.00)
GFR, Estimated: >60 mL/min (ref >60)
Glucose, Bld: 108 mg/dL — ABNORMAL HIGH (ref 70–99)
Potassium: 4 mmol/L (ref 3.5–5.1)
Sodium: 137 mmol/L (ref 135–145)

## 2021-10-07 NOTE — Progress Notes (Signed)
Daily Session Note  Patient Details  Name: Mandy Jones MRN: [id number] Date of Birth: [birth year] Referring Provider:   Flowsheet Row Pulmonary Rehab from 07/08/2021 in [facility] Cardiac and Pulmonary Rehab  Referring Provider Serafina Royals MD       Encounter Date: 10/07/2021  Check In:  Session Check In - 10/07/21 1120       Check-In   Supervising physician immediately available to respond to emergencies See telemetry face sheet for immediately available ER MD    Location [facility]-Cardiac & Pulmonary Rehab    Staff Present Birdie Sons, MPA, RN;Melissa [place], RDN, LDN;Meredith Sherryll Burger, RN BSN    Virtual Visit No    Medication changes reported     No    Fall or balance concerns reported    No    Tobacco Cessation No Change    Warm-up and Cool-down Performed on first and last piece of equipment    Resistance Training Performed Yes    VAD Patient? No    PAD/SET Patient? No      Pain Assessment   Currently in Pain? No/denies                Social History   Tobacco Use  Smoking Status Former  Smokeless Tobacco Never  Tobacco Comments   quit 30 years ago    Goals Met:  Independence with exercise equipment Exercise tolerated well No report of concerns or symptoms today Strength training completed today  Goals Unmet:  Not Applicable  Comments: Pt able to follow exercise prescription today without complaint.  Will continue to monitor for progression.    Dr. Emily Filbert is Medical Director for [place].  Dr. Ottie Glazier is Medical Director for [facility] Pulmonary Rehabilitation.

## 2021-10-07 NOTE — Patient Instructions (Addendum)
Continue weighing daily and call for an overnight weight gain of 3 pounds or more or a weekly weight gain of more than 5 pounds.   The [facility] will be moving tomorrow to [address]. Our phone number will remain the same   Decrease spironolactone to 1/2 tablet daily due to your low blood pressure.

## 2021-10-08 ENCOUNTER — Encounter: Payer: Self-pay | Admitting: Family

## 2021-10-12 ENCOUNTER — Other Ambulatory Visit: Payer: Self-pay

## 2021-10-12 DIAGNOSIS — I5022 Chronic systolic (congestive) heart failure: Secondary | ICD-10-CM

## 2021-10-12 NOTE — Progress Notes (Signed)
Daily Session Note  Patient Details  Name: Mandy Jones MRN: [id number] Date of Birth: 08-Dec-1950 Referring Provider:   Flowsheet Row Pulmonary Rehab from 07/08/2021 in [facility] Cardiac and Pulmonary Rehab  Referring Provider Serafina Royals MD       Encounter Date: 10/12/2021  Check In:  Session Check In - 10/12/21 1120       Check-In   Supervising physician immediately available to respond to emergencies See telemetry face sheet for immediately available ER MD    Location [facility]-Card[facility] & Pulmonary Rehab    Staff Present Birdie Sons, MPA, RN;Jessica [place], MA, RCEP, CCRP, CCET;Amanda Sommer, BA, ACSM CEP, Exercise Physiologist    Virtual Visit No    Medication changes reported     Yes    Comments decreased spironolactone by 1/2    Fall or balance concerns reported    No    Tobacco Cessation No Change    Warm-up and Cool-down Performed on first and last piece of equipment    Resistance Training Performed Yes    VAD Patient? No    PAD/SET Patient? No      Pain Assessment   Currently in Pain? No/denies                Social History   Tobacco Use  Smoking Status Former  Smokeless Tobacco Never  Tobacco Comments   quit 30 years ago    Goals Met:  Independence with exercise equipment Exercise tolerated well No report of concerns or symptoms today Strength training completed today  Goals Unmet:  Not Applicable  Comments: Pt able to follow exercise prescription today without complaint.  Will continue to monitor for progression.    Dr. Emily Filbert is Medical Director for [place].  Dr. Ottie Glazier is Medical Director for [facility] Pulmonary Rehabilitation.

## 2021-10-14 ENCOUNTER — Other Ambulatory Visit: Payer: Self-pay

## 2021-10-14 DIAGNOSIS — I5022 Chronic systolic (congestive) heart failure: Secondary | ICD-10-CM | POA: Diagnosis not present

## 2021-10-14 NOTE — Progress Notes (Signed)
Daily Session Note  Patient Details  Name: Mandy Jones MRN: [id number] Date of Birth: [birth year] Referring Provider:   Flowsheet Row Pulmonary Rehab from 07/08/2021 in [facility] Cardiac and Pulmonary Rehab  Referring Provider Serafina Royals MD       Encounter Date: 10/14/2021  Check In:  Session Check In - 10/14/21 1039       Check-In   Supervising physician immediately available to respond to emergencies See telemetry face sheet for immediately available ER MD    Location [facility]Card[facility] Pulmonary Rehab    Staff Present Birdie Sons, MPA, RN;Melissa [place], RDN, Rowe Pavy, BA, ACSM CEP, Exercise Physiologist;Meredith Sherryll Burger, RN BSN    Virtual Visit No    Medication changes reported     No    Fall or balance concerns reported    No    Tobacco Cessation No Change    Warm-up and Cool-down Performed on first and last piece of equipment    Resistance Training Performed Yes    VAD Patient? No    PAD/SET Patient? No      Pain Assessment   Currently in Pain? No/denies                Social History   Tobacco Use  Smoking Status Former  Smokeless Tobacco Never  Tobacco Comments   quit 30 years ago    Goals Met:  Independence with exercise equipment Exercise tolerated well No report of concerns or symptoms today Strength training completed today  Goals Unmet:  Not Applicable  Comments: Pt able to follow exercise prescription today without complaint.  Will continue to monitor for progression.    Dr. Emily Filbert is Medical Director for [place].  Dr. Ottie Glazier is Medical Director for [facility] Pulmonary Rehabilitation.

## 2021-10-20 ENCOUNTER — Encounter: Payer: Self-pay | Admitting: *Deleted

## 2021-10-20 DIAGNOSIS — I5022 Chronic systolic (congestive) heart failure: Secondary | ICD-10-CM

## 2021-10-20 NOTE — Progress Notes (Signed)
Pulmonary Individual Treatment Plan  Patient Details  Name: Mandy Jones MRN: [id number] Date of Birth: 19-Oct-1950 Referring Provider:   Flowsheet Row Pulmonary Rehab from 07/08/2021 in [facility] Cardiac and Pulmonary Rehab  Referring Provider Serafina Royals MD       Initial Encounter Date:  Flowsheet Row Pulmonary Rehab from 07/08/2021 in [facility] Cardiac and Pulmonary Rehab  Date 07/08/21       Visit Diagnosis: Heart failure, chronic systolic ([place])  Patient's Home Medications on Admission:  Current Outpatient Medications:    apixaban (ELIQUIS) 5 MG TABS tablet, Take 5 mg by mouth every 12 (twelve) hours., Disp: 60 tablet, Rfl: 0   Ascorbic Acid (VITAMIN C) 1000 MG tablet, Take 1,000 mg by mouth every evening., Disp: , Rfl:    Ashwagandha 500 MG CAPS, Take 1,000 mg by mouth daily. (Patient not taking: Reported on 09/07/2021), Disp: , Rfl:    B Complex-C (SUPER B COMPLEX PO), Take 1 tablet by mouth daily., Disp: , Rfl:    Boswellia-Glucosamine-Vit D (OSTEO BI-FLEX ONE PER DAY) TABS, Take 1 tablet by mouth daily., Disp: , Rfl:    Calcium-Magnesium-Vitamin D (CALCIUM MAGNESIUM PO), Take 1 tablet by mouth daily., Disp: , Rfl:    Cholecalciferol (VITAMIN D3) 50 MCG (2000 UT) TABS, Take 2,000 Units by mouth daily., Disp: , Rfl:    Coenzyme Q10 (CO Q 10) 100 MG CAPS, Take 100 mg by mouth daily., Disp: , Rfl:    empagliflozin (JARDIANCE) 10 MG TABS tablet, Take 1 tablet (10 mg total) by mouth daily before breakfast., Disp: 30 tablet, Rfl: 5   famotidine (PEPCID) 20 MG tablet, Take 40 mg by mouth at bedtime., Disp: , Rfl:    levothyroxine (SYNTHROID) 150 MCG tablet, Take 150 mcg by mouth daily before breakfast., Disp: , Rfl:    melatonin 5 MG TABS, Take 5 mg by mouth at bedtime as needed (sleep)., Disp: , Rfl:    Multiple Vitamins-Minerals (PRESERVISION AREDS 2 PO), Take 1 tablet by mouth daily., Disp: , Rfl:    potassium chloride (KLOR-CON) 10 MEQ tablet, Take 20 mEq by mouth daily.,  Disp: , Rfl:    Red Yeast Rice Extract 600 MG CAPS, Take 600 mg by mouth every evening., Disp: , Rfl:    sacubitril-valsartan (ENTRESTO) 24-26 MG, Take 1 tablet by mouth 2 (two) times daily., Disp: 60 tablet, Rfl: 5   spironolactone (ALDACTONE) 25 MG tablet, TAKE 1 TABLET (25 MG TOTAL) BY MOUTH DAILY. (Patient taking differently: Take 12.5 mg by mouth daily.), Disp: 90 tablet, Rfl: 3   torsemide (DEMADEX) 100 MG tablet, Take 0.5 tablets (50 mg total) by mouth daily., Disp: 30 tablet, Rfl: 3   traZODone (DESYREL) 50 MG tablet, Take 50-150 mg by mouth at bedtime as needed for sleep., Disp: , Rfl: 1   valACYclovir (VALTREX) 1000 MG tablet, Take 2,000 mg by mouth daily as needed (For breakout)., Disp: , Rfl:    XIIDRA 5 % SOLN, Apply 1 drop to eye 2 (two) times daily., Disp: , Rfl:   Past Medical History: Past Medical History:  Diagnosis Date   Arrhythmia    atrial fibrillation   Arthritis    CHF (congestive heart failure) (HCC)    Depression    Dyspnea    Dysrhythmia    Fibromyalgia 1992   GERD (gastroesophageal reflux disease)    Hepatitis    Hepatitis B 1985   Hyperlipidemia    Hypertension    Mitral valve prolapse    Pacemaker  Pacemaker 01/13/2021   Sleep apnea    CPAP   Tendon tear, ankle    rt foot   Thyroid dysfunction     Tobacco Use: Social History   Tobacco Use  Smoking Status Former  Smokeless Tobacco Never  Tobacco Comments   quit 30 years ago    Labs: Recent Review Flowsheet Data   There is no flowsheet data to display.      Pulmonary Assessment Scores:  Pulmonary Assessment Scores     Row Name 07/08/21 1125         ADL UCSD   ADL Phase Entry     SOB Score total 63     Rest 4     Walk 5     Stairs 5     Bath 1     Dress 1     Shop 2       CAT Score   CAT Score 17       mMRC Score   mMRC Score 3              UCSD: Self-administered rating of dyspnea associated with activities of daily living (ADLs) 6-point scale (0 = "not at  all" to 5 = "maximal or unable to do because of breathlessness")  Scoring Scores range from 0 to 120.  Minimally important difference is 5 units  CAT: CAT can identify the health impairment of COPD patients and is better correlated with disease progression.  CAT has a scoring range of zero to 40. The CAT score is classified into four groups of low (less than 10), medium (10 - 20), high (21-30) and very high (31-40) based on the impact level of disease on health status. A CAT score over 10 suggests significant symptoms.  A worsening CAT score could be explained by an exacerbation, poor medication adherence, poor inhaler technique, or progression of COPD or comorbid conditions.  CAT MCID is 2 points  mMRC: mMRC (Modified Medical Research Council) Dyspnea Scale is used to assess the degree of baseline functional disability in patients of respiratory disease due to dyspnea. No minimal important difference is established. A decrease in score of 1 point or greater is considered a positive change.   Pulmonary Function Assessment:   Exercise Target Goals: Exercise Program Goal: Individual exercise prescription set using results from initial 6 min walk test and THRR while considering  patients activity barriers and safety.   Exercise Prescription Goal: Initial exercise prescription builds to 30-45 minutes a day of aerobic activity, 2-3 days per week.  Home exercise guidelines will be given to patient during program as part of exercise prescription that the participant will acknowledge.  Education: Aerobic Exercise: - Group verbal and visual presentation on the components of exercise prescription. Introduces F.I.T.T principle from ACSM for exercise prescriptions.  Reviews F.I.T.T. principles of aerobic exercise including progression. Written material given at graduation. Flowsheet Row Pulmonary Rehab from 10/14/2021 in [facility] Cardiac and Pulmonary Rehab  Education need identified 07/08/21  Date  07/14/21  Educator [place]  Instruction Review Code 1- [employer] Understanding       Education: Resistance Exercise: - Group verbal and visual presentation on the components of exercise prescription. Introduces F.I.T.T principle from ACSM for exercise prescriptions  Reviews F.I.T.T. principles of resistance exercise including progression. Written material given at graduation. Flowsheet Row Pulmonary Rehab from 10/14/2021 in [facility] Cardiac and Pulmonary Rehab  Date 09/02/21  Educator [facility]  Instruction Review Code 1- Verbalizes Understanding  Education: Exercise & Equipment Safety: - Individual verbal instruction and demonstration of equipment use and safety with use of the equipment. Flowsheet Row Pulmonary Rehab from 10/14/2021 in [facility] Cardiac and Pulmonary Rehab  Education need identified 07/08/21  Date 07/08/21  Educator [place]  Instruction Review Code 1- Verbalizes Understanding       Education: Exercise Physiology & General Exercise Guidelines: - Group verbal and written instruction with models to review the exercise physiology of the cardiovascular system and associated critical values. Provides general exercise guidelines with specific guidelines to those with heart or lung disease.    Education: Flexibility, Balance, Mind/Body Relaxation: - Group verbal and visual presentation with interactive activity on the components of exercise prescription. Introduces F.I.T.T principle from ACSM for exercise prescriptions. Reviews F.I.T.T. principles of flexibility and balance exercise training including progression. Also discusses the mind body connection.  Reviews various relaxation techniques to help reduce and manage stress (i.e. Deep breathing, progressive muscle relaxation, and visualization). Balance handout provided to take home. Written material given at graduation. Flowsheet Row Pulmonary Rehab from 10/14/2021 in [facility] Cardiac and Pulmonary Rehab  Date 09/09/21  Educator [place]   Instruction Review Code 1- Verbalizes Understanding       Activity Barriers & Risk Stratification:  Activity Barriers & Cardiac Risk Stratification - 07/08/21 1121       Activity Barriers & Cardiac Risk Stratification   Activity Barriers Left Knee Replacement;Right Knee Replacement;Balance Concerns;Arthritis;Shortness of Breath;Assistive Device;Deconditioning;Muscular Weakness;Other (comment)    Comments Bursitis in left shoulder             6 Minute Walk:  6 Minute Walk     Row Name 07/08/21 1130         6 Minute Walk   Phase Initial     Distance 875 feet     Walk Time 6 minutes     # of Rest Breaks 0     MPH 1.65     METS 1.16     RPE 12     Perceived Dyspnea  2     VO2 Peak 4.07     Symptoms Yes (comment)     Comments Felt unsteady on feet     Resting HR 79 bpm     Resting BP 104/64     Resting Oxygen Saturation  97 %     Exercise Oxygen Saturation  during 6 min walk 96 %     Max Ex. HR 105 bpm     Max Ex. BP 112/66     2 Minute Post BP 102/66       Interval HR   1 Minute HR 99     2 Minute HR 102     3 Minute HR 105     4 Minute HR 102     5 Minute HR 103     6 Minute HR 101     2 Minute Post HR 87     Interval Heart Rate? Yes       Interval Oxygen   Interval Oxygen? Yes     Baseline Oxygen Saturation % 97 %     1 Minute Oxygen Saturation % 96 %     1 Minute Liters of Oxygen 0 L  RA     2 Minute Oxygen Saturation % 97 %     2 Minute Liters of Oxygen 0 L     3 Minute Oxygen Saturation % 96 %     3 Minute Liters of Oxygen 0  L     4 Minute Oxygen Saturation % 96 %     4 Minute Liters of Oxygen 0 L     5 Minute Oxygen Saturation % 97 %     5 Minute Liters of Oxygen 0 L     6 Minute Oxygen Saturation % 96 %     6 Minute Liters of Oxygen 0 L     2 Minute Post Oxygen Saturation % 98 %     2 Minute Post Liters of Oxygen 0 L             Oxygen Initial Assessment:  Oxygen Initial Assessment - 07/08/21 1124       Home Oxygen   Home Oxygen  Device None    Sleep Oxygen Prescription CPAP    Home Exercise Oxygen Prescription None    Home Resting Oxygen Prescription None    Compliance with Home Oxygen Use Yes      Initial 6 min Walk   Oxygen Used None      Program Oxygen Prescription   Program Oxygen Prescription None      Intervention   Short Term Goals To learn and exhibit compliance with exercise, home and travel O2 prescription;To learn and understand importance of monitoring SPO2 with pulse oximeter and demonstrate accurate use of the pulse oximeter.;To learn and understand importance of maintaining oxygen saturations>88%;To learn and demonstrate proper pursed lip breathing techniques or other breathing techniques. ;To learn and demonstrate proper use of respiratory medications    Long  Term Goals Exhibits compliance with exercise, home  and travel O2 prescription;Maintenance of O2 saturations>88%;Exhibits proper breathing techniques, such as pursed lip breathing or other method taught during program session;Verbalizes importance of monitoring SPO2 with pulse oximeter and return demonstration;Compliance with respiratory medication             Oxygen Re-Evaluation:  Oxygen Re-Evaluation     Row Name 07/14/21 1535 09/07/21 1139 10/07/21 1227         Program Oxygen Prescription   Program Oxygen Prescription None None None       Home Oxygen   Home Oxygen Device None None None     Sleep Oxygen Prescription CPAP CPAP CPAP     Home Exercise Oxygen Prescription None None None     Home Resting Oxygen Prescription None None None     Compliance with Home Oxygen Use Yes -- Yes       Goals/Expected Outcomes   Short Term Goals To learn and exhibit compliance with exercise, home and travel O2 prescription;To learn and understand importance of monitoring SPO2 with pulse oximeter and demonstrate accurate use of the pulse oximeter.;To learn and understand importance of maintaining oxygen saturations>88%;To learn and demonstrate  proper pursed lip breathing techniques or other breathing techniques. ;To learn and demonstrate proper use of respiratory medications To learn and exhibit compliance with exercise, home and travel O2 prescription;To learn and understand importance of monitoring SPO2 with pulse oximeter and demonstrate accurate use of the pulse oximeter.;To learn and understand importance of maintaining oxygen saturations>88%;To learn and demonstrate proper pursed lip breathing techniques or other breathing techniques. ;To learn and demonstrate proper use of respiratory medications To learn and exhibit compliance with exercise, home and travel O2 prescription;To learn and understand importance of monitoring SPO2 with pulse oximeter and demonstrate accurate use of the pulse oximeter.;To learn and understand importance of maintaining oxygen saturations>88%;To learn and demonstrate proper pursed lip breathing techniques or other breathing techniques. ;To learn and demonstrate  proper use of respiratory medications     Long  Term Goals Exhibits compliance with exercise, home  and travel O2 prescription;Maintenance of O2 saturations>88%;Exhibits proper breathing techniques, such as pursed lip breathing or other method taught during program session;Verbalizes importance of monitoring SPO2 with pulse oximeter and return demonstration;Compliance with respiratory medication Exhibits compliance with exercise, home  and travel O2 prescription;Maintenance of O2 saturations>88%;Exhibits proper breathing techniques, such as pursed lip breathing or other method taught during program session;Verbalizes importance of monitoring SPO2 with pulse oximeter and return demonstration;Compliance with respiratory medication Exhibits compliance with exercise, home  and travel O2 prescription;Maintenance of O2 saturations>88%;Exhibits proper breathing techniques, such as pursed lip breathing or other method taught during program session;Verbalizes importance of  monitoring SPO2 with pulse oximeter and return demonstration;Compliance with respiratory medication     Comments Reviewed PLB technique with pt.  Talked about how it works and it's importance in maintaining their exercise saturations. Tamani uses her CPAP most nights but can tell a difference when she doesnt.  She has a visit at the HF clinic today. Lovette uses her CPAP most nights but can tell a difference when she doesnt.  She has a visit at the HF clinic today 10/07/21.     Goals/Expected Outcomes Short: Become more profiecient at using PLB.   Long: Become independent at using PLB. Short: be consistent using CPAP Long: use PLB as needed ST: be consistent using CPAP LT: use PLB as needed              Oxygen Discharge (Final Oxygen Re-Evaluation):  Oxygen Re-Evaluation - 10/07/21 1227       Program Oxygen Prescription   Program Oxygen Prescription None      Home Oxygen   Home Oxygen Device None    Sleep Oxygen Prescription CPAP    Home Exercise Oxygen Prescription None    Home Resting Oxygen Prescription None    Compliance with Home Oxygen Use Yes      Goals/Expected Outcomes   Short Term Goals To learn and exhibit compliance with exercise, home and travel O2 prescription;To learn and understand importance of monitoring SPO2 with pulse oximeter and demonstrate accurate use of the pulse oximeter.;To learn and understand importance of maintaining oxygen saturations>88%;To learn and demonstrate proper pursed lip breathing techniques or other breathing techniques. ;To learn and demonstrate proper use of respiratory medications    Long  Term Goals Exhibits compliance with exercise, home  and travel O2 prescription;Maintenance of O2 saturations>88%;Exhibits proper breathing techniques, such as pursed lip breathing or other method taught during program session;Verbalizes importance of monitoring SPO2 with pulse oximeter and return demonstration;Compliance with respiratory medication    Comments Dalaina  uses her CPAP most nights but can tell a difference when she doesnt.  She has a visit at the HF clinic today 10/07/21.    Goals/Expected Outcomes ST: be consistent using CPAP LT: use PLB as needed             Initial Exercise Prescription:  Initial Exercise Prescription - 07/08/21 1300       Date of Initial Exercise RX and Referring Provider   Date 07/08/21    Referring Provider Serafina Royals MD      Oxygen   Maintain Oxygen Saturation 88% or higher      Recumbant Bike   Level 1    RPM 60    Watts 15    Minutes 15    METs 1.1      NuStep  Level 1    SPM 80    Minutes 15    METs 1.1      Track   Laps 16    Minutes 15    METs 1.87      Prescription Details   Frequency (times per week) 2    Duration Progress to 30 minutes of continuous aerobic without signs/symptoms of physical distress      Intensity   THRR 40-80% of Max Heartrate 106-134    Ratings of Perceived Exertion 11-13    Perceived Dyspnea 0-4      Progression   Progression Continue to progress workloads to maintain intensity without signs/symptoms of physical distress.      Resistance Training   Training Prescription Yes    Weight 3 lb    Reps 10-15             Perform Capillary Blood Glucose checks as needed.  Exercise Prescription Changes:   Exercise Prescription Changes     Row Name 07/08/21 1300 07/26/21 1400 08/09/21 1000 09/06/21 1100 09/20/21 1200     Response to Exercise   Blood Pressure (Admit) 104/64 120/64 110/60 128/68 116/60   Blood Pressure (Exercise) 112/66 120/66 114/64 106/60 --   Blood Pressure (Exit) 102/66 104/64 106/64 110/60 118/64   Heart Rate (Admit) 79 bpm 91 bpm 94 bpm 88 bpm 84 bpm   Heart Rate (Exercise) 105 bpm 100 bpm 109 bpm 105 bpm 114 bpm   Heart Rate (Exit) 85 bpm 89 bpm 94 bpm 90 bpm 104 bpm   Oxygen Saturation (Admit) 97 % 97 % 97 % 98 % 98 %   Oxygen Saturation (Exercise) 96 % 95 % 95 % 96 % 96 %   Oxygen Saturation (Exit) 98 % 96 % 96 % 96 % 96  %   Rating of Perceived Exertion (Exercise) '12 11 12 13 15   ' Perceived Dyspnea (Exercise) 2 -- -- 3 3   Symptoms Unsteady on feet -- none SOB SOB   Comments walk test results third day -- -- --   Duration -- Progress to 30 minutes of  aerobic without signs/symptoms of physical distress Continue with 30 min of aerobic exercise without signs/symptoms of physical distress. Continue with 30 min of aerobic exercise without signs/symptoms of physical distress. Continue with 30 min of aerobic exercise without signs/symptoms of physical distress.   Intensity -- THRR unchanged THRR unchanged THRR unchanged THRR unchanged     Progression   Progression -- Continue to progress workloads to maintain intensity without signs/symptoms of physical distress. Continue to progress workloads to maintain intensity without signs/symptoms of physical distress. Continue to progress workloads to maintain intensity without signs/symptoms of physical distress. Continue to progress workloads to maintain intensity without signs/symptoms of physical distress.   Average METs -- 1.85 1.94 1.62 1.94     Resistance Training   Training Prescription -- Yes Yes Yes Yes   Weight -- 3 lb 3 lb 3 lb 3 lb   Reps -- 10-15 10-15 10-15 10-15     Interval Training   Interval Training -- -- No No No     NuStep   Level -- '1 2 3 1   ' SPM -- 80 -- -- --   Minutes -- '15 15 15 15   ' METs -- -- 2.1 1.9 2.2     REL-XR   Level -- -- -- 1 3   Minutes -- -- -- 15 15   METs -- -- -- 2.1 2  T5 Nustep   Level -- -- 1 -- --   Minutes -- -- 15 -- --   METs -- -- 1.9 -- --     Biostep-RELP   Level -- -- -- 1 4   Minutes -- -- -- 15 15   METs -- -- -- 1 2     Track   Laps -- '5 15 5 10   ' Minutes -- '15 15 10 15   ' METs -- 1.27 1.82 1.5 1.54     Home Exercise Plan   Plans to continue exercise at -- -- -- -- Home (comment)  walking   Frequency -- -- -- -- Add 1 additional day to program exercise sessions.   Initial Home Exercises  Provided -- -- -- -- 09/07/21     Oxygen   Maintain Oxygen Saturation -- -- 88% or higher 88% or higher 88% or higher    Row Name 10/04/21 1300 10/18/21 1300           Response to Exercise   Blood Pressure (Admit) 100/60 --      Blood Pressure (Exercise) 110/62 --      Blood Pressure (Exit) 102/64 102/62      Heart Rate (Admit) 82 bpm 97 bpm      Heart Rate (Exercise) 105 bpm 107 bpm      Heart Rate (Exit) 92 bpm 110 bpm      Oxygen Saturation (Admit) 98 % 93 %      Oxygen Saturation (Exercise) 92 % 93 %      Oxygen Saturation (Exit) 92 % 95 %      Rating of Perceived Exertion (Exercise) 13 14      Perceived Dyspnea (Exercise) 2 3      Symptoms SOB SOB      Duration Continue with 30 min of aerobic exercise without signs/symptoms of physical distress. Continue with 30 min of aerobic exercise without signs/symptoms of physical distress.      Intensity THRR unchanged THRR unchanged        Progression   Progression Continue to progress workloads to maintain intensity without signs/symptoms of physical distress. Continue to progress workloads to maintain intensity without signs/symptoms of physical distress.      Average METs 1.83 1.9        Resistance Training   Training Prescription Yes Yes      Weight 3 lb 2 lb      Reps 10-15 10-15        Interval Training   Interval Training No No        NuStep   Level 2 --      Minutes 15 --        REL-XR   Level 3 --      Minutes 15 --      METs 2 --        Biostep-RELP   Level 1 2      Minutes 15 15      METs 2 2        Track   Laps 9 10      Minutes 15 15      METs 1.49 1.54        Home Exercise Plan   Plans to continue exercise at Home (comment)  walking Home (comment)  walking      Frequency Add 1 additional day to program exercise sessions. Add 1 additional day to program exercise sessions.      Initial Home Exercises  Provided 09/07/21 09/07/21        Oxygen   Maintain Oxygen Saturation 88% or higher 88% or higher                Exercise Comments:   Exercise Goals and Review:   Exercise Goals     Row Name 07/08/21 1358             Exercise Goals   Increase Physical Activity Yes       Intervention Provide advice, education, support and counseling about physical activity/exercise needs.;Develop an individualized exercise prescription for aerobic and resistive training based on initial evaluation findings, risk stratification, comorbidities and participant's personal goals.       Expected Outcomes Short Term: Attend rehab on a regular basis to increase amount of physical activity.;Long Term: Add in home exercise to make exercise part of routine and to increase amount of physical activity.;Long Term: Exercising regularly at least 3-5 days a week.       Increase Strength and Stamina Yes       Intervention Provide advice, education, support and counseling about physical activity/exercise needs.;Develop an individualized exercise prescription for aerobic and resistive training based on initial evaluation findings, risk stratification, comorbidities and participant's personal goals.       Expected Outcomes Short Term: Increase workloads from initial exercise prescription for resistance, speed, and METs.;Short Term: Perform resistance training exercises routinely during rehab and add in resistance training at home;Long Term: Improve cardiorespiratory fitness, muscular endurance and strength as measured by increased METs and functional capacity (6MWT)       Able to understand and use rate of perceived exertion (RPE) scale Yes       Intervention Provide education and explanation on how to use RPE scale       Expected Outcomes Short Term: Able to use RPE daily in rehab to express subjective intensity level;Long Term:  Able to use RPE to guide intensity level when exercising independently       Able to understand and use Dyspnea scale Yes       Intervention Provide education and explanation on how to use  Dyspnea scale       Expected Outcomes Short Term: Able to use Dyspnea scale daily in rehab to express subjective sense of shortness of breath during exertion;Long Term: Able to use Dyspnea scale to guide intensity level when exercising independently       Knowledge and understanding of Target Heart Rate Range (THRR) Yes       Intervention Provide education and explanation of THRR including how the numbers were predicted and where they are located for reference       Expected Outcomes Short Term: Able to state/look up THRR;Long Term: Able to use THRR to govern intensity when exercising independently;Short Term: Able to use daily as guideline for intensity in rehab       Able to check pulse independently Yes       Intervention Provide education and demonstration on how to check pulse in carotid and radial arteries.;Review the importance of being able to check your own pulse for safety during independent exercise       Expected Outcomes Short Term: Able to explain why pulse checking is important during independent exercise;Long Term: Able to check pulse independently and accurately       Understanding of Exercise Prescription Yes       Intervention Provide education, explanation, and written materials on patient's individual exercise prescription  Expected Outcomes Short Term: Able to explain program exercise prescription;Long Term: Able to explain home exercise prescription to exercise independently                Exercise Goals Re-Evaluation :  Exercise Goals Re-Evaluation     Row Name 07/14/21 1538 07/26/21 1501 08/09/21 1022 09/06/21 1057 09/07/21 1126     Exercise Goal Re-Evaluation   Exercise Goals Review Increase Physical Activity;Able to understand and use rate of perceived exertion (RPE) scale;Knowledge and understanding of Target Heart Rate Range (THRR);Understanding of Exercise Prescription;Increase Strength and Stamina;Able to check pulse independently;Able to understand and use  Dyspnea scale Increase Physical Activity;Increase Strength and Stamina Increase Physical Activity;Increase Strength and Stamina Increase Physical Activity;Increase Strength and Stamina;Understanding of Exercise Prescription Increase Physical Activity;Increase Strength and Stamina   Comments Reviewed RPE and dyspnea scales, THR and program prescription with pt today.  Pt voiced understanding and was given a copy of goals to take home. Hadas has tolerated exercise well in her first sessions. Oxygen has stayed in mid 90s during all sessions. Jakaila is doing well in rehab. Her attendance has not been consistent, however, she is switching class times which should help her stay consistent. She has increased to level 2 on the T4 Nustep and continues to walk the track. She does hit her [place] when walking. Will continue to monitor progression once her attendance is better. Caylie is doing well in rehab.  She is now walking some in class for 5 laps.  She has also done 30 min on the NuStep.  We will continue to montior her progress. Leonard is doing some exercise for legs   Expected Outcomes Short: Use RPE daily to regulate intensity. Long: Follow program prescription in THR. Short: attend consistently Long: build overall stamina Short: Maintain consistent attendance Long: Increase overall strength and MET level Short: Continue to attend regularly Long: Continue to improve stamina --    Row Name 09/07/21 1138 09/20/21 1216 10/04/21 1316 10/07/21 1120 10/18/21 1329     Exercise Goal Re-Evaluation   Exercise Goals Review -- Increase Physical Activity;Increase Strength and Stamina;Understanding of Exercise Prescription Increase Physical Activity;Increase Strength and Stamina;Understanding of Exercise Prescription Increase Physical Activity;Increase Strength and Stamina;Understanding of Exercise Prescription Increase Physical Activity;Increase Strength and Stamina   Comments Reviewed home exercise with pt today.  Pt plans to use  staff videos and consider joining the [facility] for exercise.  Reviewed THR, pulse, RPE, sign and symptoms, pulse oximetery and when to call 911 or MD.  Also discussed weather considerations and indoor options.  Pt voiced understanding. Madicyn is doing well in rehab.  She is now walking more and up to 10 laps.  She has also moved up the XR and BioStep.  We will continue to monitor her progress. Khamil is doing well in rehab.She is now up to level 3 on the XR.  We will conintue to monitor her progress. Kendelle would like join the [employer] by the end of the month - she has silver sneakers. Right now she does some exercises at home, she tries to stay active such as taking a cart when in the store and walking around. She reports her balance is what worries her. Sarye has been attending more consistently.  She reaches her THR range some sessions.  Staff wil review THR range and importance of monitoring during exercise.   Expected Outcomes Short: add one day in addition to program sessions Long:  maintain exercise independently Short: Continue to add in more  time walking Long: Continue to improve stamina Short: Maintain workloads versus up and down Long: Continue to improve stamina Short: join [employer] by the end of the month Long: Continue to improve stamina Short: know THR range and work in correct range for exercise Long: build overall stamina            Discharge Exercise Prescription (Final Exercise Prescription Changes):  Exercise Prescription Changes - 10/18/21 1300       Response to Exercise   Blood Pressure (Exit) 102/62    Heart Rate (Admit) 97 bpm    Heart Rate (Exercise) 107 bpm    Heart Rate (Exit) 110 bpm    Oxygen Saturation (Admit) 93 %    Oxygen Saturation (Exercise) 93 %    Oxygen Saturation (Exit) 95 %    Rating of Perceived Exertion (Exercise) 14    Perceived Dyspnea (Exercise) 3    Symptoms SOB    Duration Continue with 30 min of aerobic exercise without signs/symptoms of physical  distress.    Intensity THRR unchanged      Progression   Progression Continue to progress workloads to maintain intensity without signs/symptoms of physical distress.    Average METs 1.9      Resistance Training   Training Prescription Yes    Weight 2 lb    Reps 10-15      Interval Training   Interval Training No      Biostep-RELP   Level 2    Minutes 15    METs 2      Track   Laps 10    Minutes 15    METs 1.54      Home Exercise Plan   Plans to continue exercise at Home (comment)   walking   Frequency Add 1 additional day to program exercise sessions.    Initial Home Exercises Provided 09/07/21      Oxygen   Maintain Oxygen Saturation 88% or higher             Nutrition:  Target Goals: Understanding of nutrition guidelines, daily intake of sodium <1553m, cholesterol <2042m calories 30% from fat and 7% or less from saturated fats, daily to have 5 or more servings of fruits and vegetables.  Education: All About Nutrition: -Group instruction provided by verbal, written material, interactive activities, discussions, models, and posters to present general guidelines for heart healthy nutrition including fat, fiber, MyPlate, the role of sodium in heart healthy nutrition, utilization of the nutrition label, and utilization of this knowledge for meal planning. Follow up email sent as well. Written material given at graduation. [facility]  Education need identified 07/08/21       Biometrics:  Pre Biometrics - 07/08/21 1122       Pre Biometrics   Height 5' 2.25" (1.581 m)    Weight 227 lb 14.4 oz (103.4 kg)    BMI (Calculated) 41.36    Single Leg Stand 8.94 seconds              Nutrition Therapy Plan and Nutrition Goals:  Nutrition Therapy & Goals - 07/29/21 1333       Nutrition Therapy   Diet Heart healthy, low Na    Protein (specify units) 85g    Fiber 25 grams    Whole Grain  Foods 3 servings    Saturated Fats 12 max. grams    Fruits and Vegetables 8 servings/day    Sodium 1.5  grams      Personal Nutrition Goals   Nutrition Goal ST: calculate sodium intake for 3 random days, use <1/4 tsp salt when cooking LT: limit Na < 1.5g/day    Comments B: something small - whole grain cereal or eggs/peppers/potatoes L: "hit or miss" every other day. leftovers mostly or [employer] if she is on the road (grilled chicken sandwich) - 2x/week D: mostly vegetables (seasonally, colorful, salads, sometimes sweet potatoes, all squashes) with small amount of protein - size of her palm (chicken, pork, fish - sea bass, salmon, tuna, shrimp) as well as ancient grains. She makes a lot of stirfries she uses glass noodles. And will sometimes have spaghetti. She uses olive oil and some butter. She salts her food sometimes while cooking (max 1/2 tsp) and salt-free seasonings. Drinks: 2-3 cups of coffee (Oatmilk cream and sweetener), water and seltzer, tea (black). She tries to not keep sugar in the house becuase she feels like it is a problem for her. Reviewed heart healthy eating and      Intervention Plan   Intervention Prescribe, educate and counsel regarding individualized specific dietary modifications aiming towards targeted core components such as weight, hypertension, lipid management, diabetes, heart failure and other comorbidities.;Nutrition handout(s) given to patient.    Expected Outcomes Short Term Goal: Understand basic principles of dietary content, such as calories, fat, sodium, cholesterol and nutrients.;Short Term Goal: A plan has been developed with personal nutrition goals set during dietitian appointment.;Long Term Goal: Adherence to prescribed nutrition plan.             Nutrition Assessments:  MEDIFICTS Score Key: ?70 Need to make dietary changes  40-70 Heart Healthy Diet ? 40 Therapeutic Level Cholesterol Diet  Flowsheet Row Pulmonary Rehab from 07/08/2021 in [facility]  Cardiac and Pulmonary Rehab  Picture Your Plate Total Score on Admission 65      Picture Your Plate Scores: <83 Unhealthy dietary pattern with much room for improvement. 41-50 Dietary pattern unlikely to meet recommendations for good health and room for improvement. 51-60 More healthful dietary pattern, with some room for improvement.  >60 Healthy dietary pattern, although there may be some specific behaviors that could be improved.   Nutrition Goals Re-Evaluation:  Nutrition Goals Re-Evaluation     Lindsay Name 09/07/21 1121 10/07/21 1131           Goals   Nutrition Goal -- ST: try tempeh, add protein to lunch and dinner, suggested 1 calcium rich food per day LT: limit Na < 1.5g/day      Comment Paticia reports not eating much sodium at all.  She doesnt eat pre packaged foods and prefers to cook at home.  She does follow Drs that recommend whole food plant based eating. Heavenly has began a mostly whole food plant-based diet, but still eats some animal products such as chicken, fish, and eggs. She will have eggs with potatoes and peppers in the morning, a big salad for lunch with lots of different vegetables, olives, walnuts. For dinner she will have fruit with peanut butter sometimes. 3x/week she will have yogurt. She is discouraged she is not losing weight, discussed changes made will benefit her health outside of weight loss. Suggested including good source of protein for lunch and dinner such as peanut butter at dinner consistently and beans/tempeh as plant-based sources of protein at lunch. She reports that she does not like tofu, suggested trying out tempeh. She takes b-12 and vitamin D. Discussed calcium rich foods to include like fortified  plant milk, tahini, figs, leafy greens, soy products.      Expected Outcome Short: continue to watch sodium Long: maintain heart healthy diet ST: try tempeh, add protein to lunch and dinner, suggested 1 calcium rich food per day LT: limit Na < 1.5g/day                Nutrition Goals Discharge (Final Nutrition Goals Re-Evaluation):  Nutrition Goals Re-Evaluation - 10/07/21 1131       Goals   Nutrition Goal ST: try tempeh, add protein to lunch and dinner, suggested 1 calcium rich food per day LT: limit Na < 1.5g/day    Comment Arieal has began a mostly whole food plant-based diet, but still eats some animal products such as chicken, fish, and eggs. She will have eggs with potatoes and peppers in the morning, a big salad for lunch with lots of different vegetables, olives, walnuts. For dinner she will have fruit with peanut butter sometimes. 3x/week she will have yogurt. She is discouraged she is not losing weight, discussed changes made will benefit her health outside of weight loss. Suggested including good source of protein for lunch and dinner such as peanut butter at dinner consistently and beans/tempeh as plant-based sources of protein at lunch. She reports that she does not like tofu, suggested trying out tempeh. She takes b-12 and vitamin D. Discussed calcium rich foods to include like fortified plant milk, tahini, figs, leafy greens, soy products.    Expected Outcome ST: try tempeh, add protein to lunch and dinner, suggested 1 calcium rich food per day LT: limit Na < 1.5g/day             Psychosocial: Target Goals: Acknowledge presence or absence of significant depression and/or stress, maximize coping skills, provide positive support system. Participant is able to verbalize types and ability to use techniques and skills needed for reducing stress and depression.   Education: Stress, Anxiety, and Depression - Group verbal and visual presentation to define topics covered.  Reviews how body is impacted by stress, anxiety, and depression.  Also discusses healthy ways to reduce stress and to treat/manage anxiety and depression.  Written material given at graduation. Flowsheet Row Pulmonary Rehab from 10/14/2021 in [facility] Cardiac and Pulmonary Rehab   Date 10/14/21  Educator [facility]  Instruction Review Code 1- [employer] Understanding       Education: Sleep Hygiene -Provides group verbal and written instruction about how sleep can affect your health.  Define sleep hygiene, discuss sleep cycles and impact of sleep habits. Review good sleep hygiene tips.    Initial Review & Psychosocial Screening:  Initial Psych Review & Screening - 07/05/21 1413       Initial Review   Current issues with Current Sleep Concerns;Current Stress Concerns    Source of Stress Concerns Chronic Illness;Unable to participate in former interests or hobbies;Unable to perform yard/household activities      [place]? No    Concerns No support system      Barriers   Psychosocial barriers to participate in program There are no identifiable barriers or psychosocial needs.      Screening Interventions   Interventions Encouraged to exercise;Provide feedback about the scores to participant;To provide support and resources with identified psychosocial needs    Expected Outcomes Short Term goal: Utilizing psychosocial counselor, staff and physician to assist with identification of specific Stressors or current issues interfering with healing process. Setting desired goal for each stressor or current issue identified.;Long  Term Goal: Stressors or current issues are controlled or eliminated.;Short Term goal: Identification and review with participant of any Quality of Life or Depression concerns found by scoring the questionnaire.;Long Term goal: The participant improves quality of Life and PHQ9 Scores as seen by post scores and/or verbalization of changes             Quality of Life Scores:  Scores of 19 and below usually indicate a poorer quality of life in these areas.  A difference of  2-3 points is a clinically meaningful difference.  A difference of 2-3 points in the total score of the Quality of Life Index has been associated with  significant improvement in overall quality of life, self-image, physical symptoms, and general health in studies assessing change in quality of life.  PHQ-9: Recent Review Flowsheet Data     Depression screen [facility] 2/9 09/07/2021 08/31/2021 07/28/2021 07/08/2021 03/03/2021   Decreased Interest 1 1 0 1 0   Down, Depressed, Hopeless 3 2 0 2 1   PHQ - 2 Score 4 3 0 3 1   Altered sleeping 0 1 - 3 -   Tired, decreased energy 2 3 - 3 -   Change in appetite 0 2 - 2 -   Feeling bad or failure about yourself  2 2 - 1 -   Trouble concentrating 1 2 - 1 -   Moving slowly or fidgety/restless 0 2 - 1 -   Suicidal thoughts 0 0 - 0 -   PHQ-9 Score 9 15 - 14 -   Difficult doing work/chores Somewhat difficult Very difficult - Somewhat difficult -      Interpretation of Total Score  Total Score Depression Severity:  1-4 = Minimal depression, 5-9 = Mild depression, 10-14 = Moderate depression, 15-19 = Moderately severe depression, 20-27 = Severe depression   Psychosocial Evaluation and Intervention:  Psychosocial Evaluation - 07/05/21 1424       Psychosocial Evaluation & Interventions   Interventions Stress management education;Encouraged to exercise with the program and follow exercise prescription    Comments Noreta reports her heart failure symptoms have been getting in the way of her daily life for the last 5 years. She feels like she can't participate in her hobbies or take her dogs places like she used to. Her mother passed away in the last two years and her only brother that is close by doesn't speak to her. [place] with her dogs, so she states she doesn't have a real support system. She was exercising regularly trying to lose weight but her heart failure symptoms have gotten in the way and now she is up to a weight she has never been before and is hating it. She wants to feel better and increase her stamina as well as lose weight. She does have some vestibular issues  post the second Covid vaccine that also causes some balance issues.She takes medication to sleep but also takes a diuretic so she feels like she is constantly in the bathroom. She does have a copay so she may not be able to complete the total number of sessions, but she is very motivated to do whatever it takes to gain some control back in her life.    Expected Outcomes Short; attend Pulmonary Rehab for exercise and education. Long; develop and maintain positive self care habits.    Continue Psychosocial Services  Follow up required by staff  Psychosocial Re-Evaluation:  Psychosocial Re-Evaluation     Harrod Name 08/31/21 1129 10/07/21 1125           Psychosocial Re-Evaluation   Current issues with Current Stress Concerns;Current Anxiety/Panic;Current Depression Current Stress Concerns;Current Anxiety/Panic;Current Depression      Comments Reviewed patient health questionnaire (PHQ-9) with patient for follow up. Previously, patients score indicated signs/symptoms of depression.  Reviewed to see if patient is improving symptom wise while in program.  Score declined and patient states that it is because they have continued to have depression symptoms. She has not been able to get a hold of a therapist that would take new pts/seniors.  We talked about trying to go through her PCP, but she is out on maternity leave; so recommended to try cardiologist. We also gave her our current list of therapist in the area. She reports stress over things she can no longer do, she hasn't tried out therapy yet becuase they do not take her insurance; the therapist she was seeing retired and a lot of places don't take seniors. Her PCP continues to be out on maternity leave. She has a list that she has to go through to call. She has dogs at home which reduces her stress.      Expected Outcomes Short: get set up with therapist and talk to doctor about symptoms Long: conitnue to exercise for mental boost. Short:  get set up with therapist and talk to doctor about symptoms Long: conitnue to exercise for mental boost.      Interventions Stress management education;Encouraged to attend Pulmonary Rehabilitation for the exercise Stress management education;Encouraged to attend Pulmonary Rehabilitation for the exercise      Continue Psychosocial Services  Follow up required by staff Follow up required by staff        Initial Review   Source of Stress Concerns -- Chronic Illness;Unable to participate in former interests or hobbies;Unable to perform yard/household activities               Psychosocial Discharge (Final Psychosocial Re-Evaluation):  Psychosocial Re-Evaluation - 10/07/21 1125       Psychosocial Re-Evaluation   Current issues with Current Stress Concerns;Current Anxiety/Panic;Current Depression    Comments She reports stress over things she can no longer do, she hasn't tried out therapy yet becuase they do not take her insurance; the therapist she was seeing retired and a lot of places don't take seniors. Her PCP continues to be out on maternity leave. She has a list that she has to go through to call. She has dogs at home which reduces her stress.    Expected Outcomes Short: get set up with therapist and talk to doctor about symptoms Long: conitnue to exercise for mental boost.    Interventions Stress management education;Encouraged to attend Pulmonary Rehabilitation for the exercise    Continue Psychosocial Services  Follow up required by staff      Initial Review   Source of Stress Concerns Chronic Illness;Unable to participate in former interests or hobbies;Unable to perform yard/household activities             Education: Education Goals: Education classes will be provided on a weekly basis, covering required topics. Participant will state understanding/return demonstration of topics presented.  Learning Barriers/Preferences:  Learning Barriers/Preferences - 07/05/21 1413        Learning Barriers/Preferences   Learning Barriers None    Learning Preferences None             General  Pulmonary Education Topics:  Infection Prevention: - Provides verbal and written material to individual with discussion of infection control including proper hand washing and proper equipment cleaning during exercise session. Flowsheet Row Pulmonary Rehab from 10/14/2021 in [facility] Cardiac and Pulmonary Rehab  Education need identified 07/08/21  Date 07/08/21  Educator [place]  Instruction Review Code 1- Verbalizes Understanding       Falls Prevention: - Provides verbal and written material to individual with discussion of falls prevention and safety. Flowsheet Row Pulmonary Rehab from 10/14/2021 in [facility] Cardiac and Pulmonary Rehab  Education need identified 07/08/21  Date 07/08/21  Educator [place]  Instruction Review Code 1- Verbalizes Understanding       Chronic Lung Disease Review: - Group verbal instruction with posters, models, PowerPoint presentations and videos,  to review new updates, new respiratory medications, new advancements in procedures and treatments. Providing information on websites and "800" numbers for continued self-education. Includes information about supplement oxygen, available portable oxygen systems, continuous and intermittent flow rates, oxygen safety, concentrators, and Medicare reimbursement for oxygen. Explanation of Pulmonary Drugs, including class, frequency, complications, importance of spacers, rinsing mouth after steroid MDI's, and proper cleaning methods for nebulizers. Review of basic lung anatomy and physiology related to function, structure, and complications of lung disease. Review of risk factors. Discussion about methods for diagnosing sleep apnea and types of masks and machines for OSA. Includes a review of the use of types of environmental controls: home humidity, furnaces, filters, dust mite/pet prevention, HEPA vacuums. Discussion about  weather changes, air quality and the benefits of nasal washing. Instruction on Warning signs, infection symptoms, calling MD promptly, preventive modes, and value of vaccinations. Review of effective airway clearance, coughing and/or vibration techniques. Emphasizing that all should Create an Action Plan. Written material given at graduation. Flowsheet Row Pulmonary Rehab from 10/14/2021 in [facility] Cardiac and Pulmonary Rehab  Date 08/04/21  Educator [facility]  Instruction Review Code 1- Verbalizes Understanding       AED/CPR: - Group verbal and written instruction with the use of models to demonstrate the basic use of the AED with the basic ABC's of resuscitation.    Anatomy and Cardiac Procedures: - Group verbal and visual presentation and models provide information about basic cardiac anatomy and function. Reviews the testing methods done to diagnose heart disease and the outcomes of the test results. Describes the treatment choices: Medical Management, Angioplasty, or Coronary Bypass Surgery for treating various heart conditions including Myocardial Infarction, Angina, Valve Disease, and Cardiac Arrhythmias.  Written material given at graduation. Flowsheet Row Pulmonary Rehab from 10/14/2021 in [facility] Cardiac and Pulmonary Rehab  Date 09/02/21  Educator [place]  Instruction Review Code 1- Verbalizes Understanding       Medication Safety: - Group verbal and visual instruction to review commonly prescribed medications for heart and lung disease. Reviews the medication, class of the drug, and side effects. Includes the steps to properly store meds and maintain the prescription regimen.  Written material given at graduation. Flowsheet Row Pulmonary Rehab from 10/14/2021 in [facility]  Date 09/16/21  Educator SB  Instruction Review Code 1- Verbalizes Understanding       Other: -Provides group and verbal instruction on various topics (see comments)   Knowledge Questionnaire  Score:  Knowledge Questionnaire Score - 07/08/21 1120       Knowledge Questionnaire Score   Pre Score 23/26 (cardiac): Diabetes, Nutrition, Aerobic              Core Components/Risk Factors/Patient  Goals at Admission:  Personal Goals and Risk Factors at Admission - 07/08/21 1358       Core Components/Risk Factors/Patient Goals on Admission    Weight Management Yes;Weight Loss    Intervention Weight Management: Develop a combined nutrition and exercise program designed to reach desired caloric intake, while maintaining appropriate intake of nutrient and fiber, sodium and fats, and appropriate energy expenditure required for the weight goal.;Weight Management: Provide education and appropriate resources to help participant work on and attain dietary goals.;Obesity: Provide education and appropriate resources to help participant work on and attain dietary goals.    Admit Weight 227 lb (103 kg)    Goal Weight: Short Term 222 lb (100.7 kg)    Goal Weight: Long Term 150 lb (68 kg)    Expected Outcomes Short Term: Continue to assess and modify interventions until short term weight is achieved;Long Term: Adherence to nutrition and physical activity/exercise program aimed toward attainment of established weight goal;Weight Loss: Understanding of general recommendations for a balanced deficit meal plan, which promotes 1-2 lb weight loss per week and includes a negative energy balance of [phone] kcal/d;Understanding recommendations for meals to include 15-35% energy as protein, 25-35% energy from fat, 35-60% energy from carbohydrates, less than 250m of dietary cholesterol, 20-35 gm of total fiber daily;Understanding of distribution of calorie intake throughout the day with the consumption of 4-5 meals/snacks    Heart Failure Yes    Intervention Provide a combined exercise and nutrition program that is supplemented with education, support and counseling about heart failure. Directed toward relieving  symptoms such as shortness of breath, decreased exercise tolerance, and extremity edema.    Expected Outcomes Short term: Attendance in program 2-3 days a week with increased exercise capacity. Reported lower sodium intake. Reported increased fruit and vegetable intake. Reports medication compliance.;Improve functional capacity of life;Short term: Daily weights obtained and reported for increase. Utilizing diuretic protocols set by physician.;Long term: Adoption of self-care skills and reduction of barriers for early signs and symptoms recognition and intervention leading to self-care maintenance.    Hypertension Yes    Intervention Provide education on lifestyle modifcations including regular physical activity/exercise, weight management, moderate sodium restriction and increased consumption of fresh fruit, vegetables, and low fat dairy, alcohol moderation, and smoking cessation.;Monitor prescription use compliance.    Expected Outcomes Short Term: Continued assessment and intervention until BP is < 140/937mHG in hypertensive participants. < 130/8072mG in hypertensive participants with diabetes, heart failure or chronic kidney disease.;Long Term: Maintenance of blood pressure at goal levels.    Lipids Yes    Intervention Provide education and support for participant on nutrition & aerobic/resistive exercise along with prescribed medications to achieve LDL <63m37mDL >40mg35m Expected Outcomes Short Term: Participant states understanding of desired cholesterol values and is compliant with medications prescribed. Participant is following exercise prescription and nutrition guidelines.;Long Term: Cholesterol controlled with medications as prescribed, with individualized exercise RX and with personalized nutrition plan. Value goals: LDL < 63mg,23m > 40 mg.             Education:Diabetes - Individual verbal and written instruction to review signs/symptoms of diabetes, desired ranges of glucose level  fasting, after meals and with exercise. Acknowledge that pre and post exercise glucose checks will be done for 3 sessions at entry of program.   Know Your Numbers and Heart Failure: - Group verbal and visual instruction to discuss disease risk factors for cardiac and pulmonary disease and treatment options.  Reviews associated critical values for Overweight/Obesity, Hypertension, Cholesterol, and Diabetes.  Discusses basics of heart failure: signs/symptoms and treatments.  Introduces Heart Failure Zone chart for action plan for heart failure.  Written material given at graduation. Flowsheet Row Pulmonary Rehab from 10/14/2021 in [facility]  Education need identified 07/08/21  Date 09/30/21  Educator [facility]  Instruction Review Code 1- Verbalizes Understanding       Core Components/Risk Factors/Patient Goals Review:   Goals and Risk Factor Review     Row Name 09/07/21 1124 10/07/21 1123           Core Components/Risk Factors/Patient Goals Review   Personal Goals Review Hypertension;Weight Management/Obesity Hypertension;Weight Management/Obesity      Review Mikalyn sees the heart failure clinic after class today.  She checks her BP occasionally at home.  It was 128/60 resting today.  She feels she holds a lot of fluid and spends a lof of time in the bathroom from taking fluid pills.  She does weigh daily.  It is going up.  She will follow up at thr HF clinic. Tabitha saw her neurologist last week and he told her the balance issues she is having will likely be forever. Solyana will got the heart failure clinic today for her low BP - today 94/62; she has been running low. She checks her BP at home, but not consistently. Vania Rea has been going well aside from her BP being low. She has stopped her supplements until she goes to the heart failure clinic. She continues to take her weight daily.      Expected Outcomes Short: contineu to monitor weight and BP Long: manage risk factors long  term Short: Monitor weight and BP Long: manage risk factors long term               Core Components/Risk Factors/Patient Goals at Discharge (Final Review):   Goals and Risk Factor Review - 10/07/21 1123       Core Components/Risk Factors/Patient Goals Review   Personal Goals Review Hypertension;Weight Management/Obesity    Review Reka saw her neurologist last week and he told her the balance issues she is having will likely be forever. Laelah will got the heart failure clinic today for her low BP - today 94/62; she has been running low. She checks her BP at home, but not consistently. Vania Rea has been going well aside from her BP being low. She has stopped her supplements until she goes to the heart failure clinic. She continues to take her weight daily.    Expected Outcomes Short: Monitor weight and BP Long: manage risk factors long term             ITP Comments:  ITP Comments     Row Name 07/05/21 1434 07/08/21 1121 07/14/21 1534 07/28/21 0724 07/29/21 1444   ITP Comments Initial telephone orientation completed. Diagnosis can be found in [facility] 8/24. EP orientation scheduled for Thursday 11/10 at 9:30. Completed 6MWT and gym orientation. Initial ITP created and sent for review to Dr. Ottie Glazier, Medical Director. First full day of exercise!  Patient was oriented to gym and equipment including functions, settings, policies, and procedures.  Patient's individual exercise prescription and treatment plan were reviewed.  All starting workloads were established based on the results of the 6 minute walk test done at initial orientation visit.  The plan for exercise progression was also introduced and progression will be customized based on patient's performance and goals. 30 Day review completed. Medical  Director ITP review done, changes made as directed, and signed approval by Market researcher.   New to program Completed initial RD consultation    Row Name 08/17/21 1103 08/25/21 0744  09/22/21 0906 10/20/21 0849     ITP Comments Charron has missed some sessions and is travelling for the holiday.  Staff were not able to speak with her about goals. 30 Day review completed. Medical Director ITP review done, changes made as directed, and signed approval by Medical Director. 30 Day review completed. Medical Director ITP review done, changes made as directed, and signed approval by Medical Director. 30 Day review completed. Medical Director ITP review done, changes made as directed, and signed approval by Medical Director.             Comments:

## 2021-10-22 ENCOUNTER — Telehealth: Payer: Self-pay

## 2021-10-22 NOTE — Telephone Encounter (Signed)
Received new patient paperwork but no referral in the system. LVM to get informationfrom patient

## 2021-10-25 ENCOUNTER — Telehealth: Payer: Self-pay | Admitting: Family

## 2021-10-25 NOTE — Telephone Encounter (Addendum)
Patient was denied patient assistance through [employer] for [place] due to perscription drug coverage.    Mandy Jones, NT

## 2021-10-26 DIAGNOSIS — I5022 Chronic systolic (congestive) heart failure: Secondary | ICD-10-CM

## 2021-10-26 NOTE — Patient Instructions (Addendum)
Discharge Patient Instructions  Patient Details  Name: Mandy Jones MRN: [id number] Date of Birth: [birth year] Referring Provider:  No ref. provider found   Number of Visits: 17  Reason for Discharge:  Patient reached a stable level of exercise. Patient independent in their exercise. Early Exit:  Insurance: cost of copay  Smoking History:  Social History   Tobacco Use  Smoking Status Former  Smokeless Tobacco Never  Tobacco Comments   quit 30 years ago    Diagnosis:  Heart failure, chronic systolic (HCC)  Initial Exercise Prescription:  Initial Exercise Prescription - 07/08/21 1300       Date of Initial Exercise RX and Referring Provider   Date 07/08/21    Referring Provider Serafina Royals MD      Oxygen   Maintain Oxygen Saturation 88% or higher      Recumbant Bike   Level 1    RPM 60    Watts 15    Minutes 15    METs 1.1      NuStep   Level 1    SPM 80    Minutes 15    METs 1.1      Track   Laps 16    Minutes 15    METs 1.87      Prescription Details   Frequency (times per week) 2    Duration Progress to 30 minutes of continuous aerobic without signs/symptoms of physical distress      Intensity   THRR 40-80% of Max Heartrate 106-134    Ratings of Perceived Exertion 11-13    Perceived Dyspnea 0-4      Progression   Progression Continue to progress workloads to maintain intensity without signs/symptoms of physical distress.      Resistance Training   Training Prescription Yes    Weight 3 lb    Reps 10-15             Discharge Exercise Prescription (Final Exercise Prescription Changes):  Exercise Prescription Changes - 10/18/21 1300       Response to Exercise   Blood Pressure (Exit) 102/62    Heart Rate (Admit) 97 bpm    Heart Rate (Exercise) 107 bpm    Heart Rate (Exit) 110 bpm    Oxygen Saturation (Admit) 93 %    Oxygen Saturation (Exercise) 93 %    Oxygen Saturation (Exit) 95 %    Rating of Perceived Exertion  (Exercise) 14    Perceived Dyspnea (Exercise) 3    Symptoms SOB    Duration Continue with 30 min of aerobic exercise without signs/symptoms of physical distress.    Intensity THRR unchanged      Progression   Progression Continue to progress workloads to maintain intensity without signs/symptoms of physical distress.    Average METs 1.9      Resistance Training   Training Prescription Yes    Weight 2 lb    Reps 10-15      Interval Training   Interval Training No      Biostep-RELP   Level 2    Minutes 15    METs 2      Track   Laps 10    Minutes 15    METs 1.54      Home Exercise Plan   Plans to continue exercise at Home (comment)   walking   Frequency Add 1 additional day to program exercise sessions.    Initial Home Exercises Provided 09/07/21      Oxygen  Maintain Oxygen Saturation 88% or higher             Functional Capacity:  6 Minute Walk     Row Name 07/08/21 1130         6 Minute Walk   Phase Initial     Distance 875 feet     Walk Time 6 minutes     # of Rest Breaks 0     MPH 1.65     METS 1.16     RPE 12     Perceived Dyspnea  2     VO2 Peak 4.07     Symptoms Yes (comment)     Comments Felt unsteady on feet     Resting HR 79 bpm     Resting BP 104/64     Resting Oxygen Saturation  97 %     Exercise Oxygen Saturation  during 6 min walk 96 %     Max Ex. HR 105 bpm     Max Ex. BP 112/66     2 Minute Post BP 102/66       Interval HR   1 Minute HR 99     2 Minute HR 102     3 Minute HR 105     4 Minute HR 102     5 Minute HR 103     6 Minute HR 101     2 Minute Post HR 87     Interval Heart Rate? Yes       Interval Oxygen   Interval Oxygen? Yes     Baseline Oxygen Saturation % 97 %     1 Minute Oxygen Saturation % 96 %     1 Minute Liters of Oxygen 0 L  RA     2 Minute Oxygen Saturation % 97 %     2 Minute Liters of Oxygen 0 L     3 Minute Oxygen Saturation % 96 %     3 Minute Liters of Oxygen 0 L     4 Minute Oxygen  Saturation % 96 %     4 Minute Liters of Oxygen 0 L     5 Minute Oxygen Saturation % 97 %     5 Minute Liters of Oxygen 0 L     6 Minute Oxygen Saturation % 96 %     6 Minute Liters of Oxygen 0 L     2 Minute Post Oxygen Saturation % 98 %     2 Minute Post Liters of Oxygen 0 L              Nutrition & Weight - Outcomes:  Pre Biometrics - 07/08/21 1122       Pre Biometrics   Height 5' 2.25" (1.581 m)    Weight 227 lb 14.4 oz (103.4 kg)    BMI (Calculated) 41.36    Single Leg Stand 8.94 seconds              Nutrition:  Nutrition Therapy & Goals - 07/29/21 1333       Nutrition Therapy   Diet Heart healthy, low Na    Protein (specify units) 85g    Fiber 25 grams    Whole Grain Foods 3 servings    Saturated Fats 12 max. grams    Fruits and Vegetables 8 servings/day    Sodium 1.5 grams      Personal Nutrition Goals   Nutrition Goal ST: calculate sodium intake for 3 random  days, use <1/4 tsp salt when cooking LT: limit Na < 1.5g/day    Comments B: something small - whole grain cereal or eggs/peppers/potatoes L: "hit or miss" every other day. leftovers mostly or [employer] if she is on the road (grilled chicken sandwich) - 2x/week D: mostly vegetables (seasonally, colorful, salads, sometimes sweet potatoes, all squashes) with small amount of protein - size of her palm (chicken, pork, fish - sea bass, salmon, tuna, shrimp) as well as ancient grains. She makes a lot of stirfries she uses glass noodles. And will sometimes have spaghetti. She uses olive oil and some butter. She salts her food sometimes while cooking (max 1/2 tsp) and salt-free seasonings. Drinks: 2-3 cups of coffee (Oatmilk cream and sweetener), water and seltzer, tea (black). She tries to not keep sugar in the house becuase she feels like it is a problem for her. Reviewed heart healthy eating and      Intervention Plan   Intervention Prescribe, educate and counsel regarding individualized specific dietary  modifications aiming towards targeted core components such as weight, hypertension, lipid management, diabetes, heart failure and other comorbidities.;Nutrition handout(s) given to patient.    Expected Outcomes Short Term Goal: Understand basic principles of dietary content, such as calories, fat, sodium, cholesterol and nutrients.;Short Term Goal: A plan has been developed with personal nutrition goals set during dietitian appointment.;Long Term Goal: Adherence to prescribed nutrition plan.            Goals reviewed with patient; copy given to patient.

## 2021-10-26 NOTE — Progress Notes (Signed)
Pulmonary Individual Treatment Plan  Patient Details  Name: Mandy Jones MRN: [id number] Date of Birth: Jul 12, 1951 Referring Provider:   Flowsheet Row Pulmonary Rehab [facility] 07/08/[facility]  Referring Provider Serafina Royals MD       Initial Encounter Date:  Flowsheet Row Pulmonary Rehab from 07/08/2021 in [facility] Cardiac and Pulmonary Rehab  Date 07/08/21       Visit Diagnosis: Heart failure, chronic systolic ([place])  Patient's Home Medications on Admission:  Current Outpatient Medications:    apixaban (ELIQUIS) 5 MG TABS tablet, Take 5 mg by mouth every 12 (twelve) hours., Disp: 60 tablet, Rfl: 0   Ascorbic Acid (VITAMIN C) 1000 MG tablet, Take 1,000 mg by mouth every evening., Disp: , Rfl:    Ashwagandha 500 MG CAPS, Take 1,000 mg by mouth daily. (Patient not taking: Reported on 09/07/2021), Disp: , Rfl:    B Complex-C (SUPER B COMPLEX PO), Take 1 tablet by mouth daily., Disp: , Rfl:    Boswellia-Glucosamine-Vit D (OSTEO BI-FLEX ONE PER DAY) TABS, Take 1 tablet by mouth daily., Disp: , Rfl:    Calcium-Magnesium-Vitamin D (CALCIUM MAGNESIUM PO), Take 1 tablet by mouth daily., Disp: , Rfl:    Cholecalciferol (VITAMIN D3) 50 MCG (2000 UT) TABS, Take 2,000 Units by mouth daily., Disp: , Rfl:    Coenzyme Q10 (CO Q 10) 100 MG CAPS, Take 100 mg by mouth daily., Disp: , Rfl:    empagliflozin (JARDIANCE) 10 MG TABS tablet, Take 1 tablet (10 mg total) by mouth daily before breakfast., Disp: 30 tablet, Rfl: 5   famotidine (PEPCID) 20 MG tablet, Take 40 mg by mouth at bedtime., Disp: , Rfl:    levothyroxine (SYNTHROID) 150 MCG tablet, Take 150 mcg by mouth daily before breakfast., Disp: , Rfl:    melatonin 5 MG TABS, Take 5 mg by mouth at bedtime as needed (sleep)., Disp: , Rfl:    Multiple Vitamins-Minerals (PRESERVISION AREDS 2 PO), Take 1 tablet by mouth daily., Disp: , Rfl:    potassium chloride (KLOR-CON) 10 MEQ tablet, Take 20 mEq by mouth daily.,  Disp: , Rfl:    Red Yeast Rice Extract 600 MG CAPS, Take 600 mg by mouth every evening., Disp: , Rfl:    sacubitril-valsartan (ENTRESTO) 24-26 MG, Take 1 tablet by mouth 2 (two) times daily., Disp: 60 tablet, Rfl: 5   spironolactone (ALDACTONE) 25 MG tablet, TAKE 1 TABLET (25 MG TOTAL) BY MOUTH DAILY. (Patient taking differently: Take 12.5 mg by mouth daily.), Disp: 90 tablet, Rfl: 3   torsemide (DEMADEX) 100 MG tablet, Take 0.5 tablets (50 mg total) by mouth daily., Disp: 30 tablet, Rfl: 3   traZODone (DESYREL) 50 MG tablet, Take 50-150 mg by mouth at bedtime as needed for sleep., Disp: , Rfl: 1   valACYclovir (VALTREX) 1000 MG tablet, Take 2,000 mg by mouth daily as needed (For breakout)., Disp: , Rfl:    XIIDRA 5 % SOLN, Apply 1 drop to eye 2 (two) times daily., Disp: , Rfl:   Past Medical History: Past Medical History:  Diagnosis Date   Arrhythmia    atrial fibrillation   Arthritis    CHF (congestive heart failure) (HCC)    Depression    Dyspnea    Dysrhythmia    Fibromyalgia 1992   GERD (gastroesophageal reflux disease)    Hepatitis    Hepatitis B 1985   Hyperlipidemia    Hypertension    Mitral valve prolapse    Pacemaker  Pacemaker 01/13/2021   Sleep apnea    CPAP   Tendon tear, ankle    rt foot   Thyroid dysfunction     Tobacco Use: Social History   Tobacco Use  Smoking Status Former  Smokeless Tobacco Never  Tobacco Comments   quit 30 years ago    Labs: Recent Review Flowsheet Data   There is no flowsheet data to display.      Pulmonary Assessment Scores:  Pulmonary Assessment Scores     Row Name 07/08/21 1125         ADL UCSD   ADL Phase Entry     SOB Score total 63     Rest 4     Walk 5     Stairs 5     Bath 1     Dress 1     Shop 2       CAT Score   CAT Score 17       mMRC Score   mMRC Score 3              UCSD: Self-administered rating of dyspnea associated with activities of daily living (ADLs) 6-point scale (0 = "not at  all" to 5 = "maximal or unable to do because of breathlessness")  Scoring Scores range from 0 to 120.  Minimally important difference is 5 units  CAT: CAT can identify the health impairment of COPD patients and is better correlated with disease progression.  CAT has a scoring range of zero to 40. The CAT score is classified into four groups of low (less than 10), medium (10 - 20), high (21-30) and very high (31-40) based on the impact level of disease on health status. A CAT score over 10 suggests significant symptoms.  A worsening CAT score could be explained by an exacerbation, poor medication adherence, poor inhaler technique, or progression of COPD or comorbid conditions.  CAT MCID is 2 points  mMRC: mMRC (Modified Medical Research Council) Dyspnea Scale is used to assess the degree of baseline functional disability in patients of respiratory disease due to dyspnea. No minimal important difference is established. A decrease in score of 1 point or greater is considered a positive change.   Pulmonary Function Assessment:   Exercise Target Goals: Exercise Program Goal: Individual exercise prescription set using results from initial 6 min walk test and THRR while considering  patients activity barriers and safety.   Exercise Prescription Goal: Initial exercise prescription builds to 30-45 minutes a day of aerobic activity, 2-3 days per week.  Home exercise guidelines will be given to patient during program as part of exercise prescription that the participant will acknowledge.  Education: Aerobic Exercise: - Group verbal and visual presentation on the components of exercise prescription. Introduces F.I.T.T principle from ACSM for exercise prescriptions.  Reviews F.I.T.T. principles of aerobic exercise including progression. Written material given at graduation. Flowsheet Row Pulmonary Rehab from 10/14/2021 in [facility] Cardiac and Pulmonary Rehab  Education need identified 07/08/21  Date  07/14/21  Educator [place]  Instruction Review Code 1- [employer] Understanding       Education: Resistance Exercise: - Group verbal and visual presentation on the components of exercise prescription. Introduces F.I.T.T principle from ACSM for exercise prescriptions  Reviews F.I.T.T. principles of resistance exercise including progression. Written material given at graduation. Flowsheet Row Pulmonary Rehab from 10/14/2021 in [facility] Cardiac and Pulmonary Rehab  Date 09/02/21  Educator [facility]  Instruction Review Code 1- Verbalizes Understanding  Education: Exercise & Equipment Safety: - Individual verbal instruction and demonstration of equipment use and safety with use of the equipment. Flowsheet Row Pulmonary Rehab from 10/14/2021 in [facility] Cardiac and Pulmonary Rehab  Education need identified 07/08/21  Date 07/08/21  Educator [place]  Instruction Review Code 1- Verbalizes Understanding       Education: Exercise Physiology & General Exercise Guidelines: - Group verbal and written instruction with models to review the exercise physiology of the cardiovascular system and associated critical values. Provides general exercise guidelines with specific guidelines to those with heart or lung disease.    Education: Flexibility, Balance, Mind/Body Relaxation: - Group verbal and visual presentation with interactive activity on the components of exercise prescription. Introduces F.I.T.T principle from ACSM for exercise prescriptions. Reviews F.I.T.T. principles of flexibility and balance exercise training including progression. Also discusses the mind body connection.  Reviews various relaxation techniques to help reduce and manage stress (i.e. Deep breathing, progressive muscle relaxation, and visualization). Balance handout provided to take home. Written material given at graduation. Flowsheet Row Pulmonary Rehab from 10/14/2021 in [facility] Cardiac and Pulmonary Rehab  Date 09/09/21  Educator [place]   Instruction Review Code 1- Verbalizes Understanding       Activity Barriers & Risk Stratification:  Activity Barriers & Cardiac Risk Stratification - 07/08/21 1121       Activity Barriers & Cardiac Risk Stratification   Activity Barriers Left Knee Replacement;Right Knee Replacement;Balance Concerns;Arthritis;Shortness of Breath;Assistive Device;Deconditioning;Muscular Weakness;Other (comment)    Comments Bursitis in left shoulder             6 Minute Walk:  6 Minute Walk     Row Name 07/08/21 1130         6 Minute Walk   Phase Initial     Distance 875 feet     Walk Time 6 minutes     # of Rest Breaks 0     MPH 1.65     METS 1.16     RPE 12     Perceived Dyspnea  2     VO2 Peak 4.07     Symptoms Yes (comment)     Comments Felt unsteady on feet     Resting HR 79 bpm     Resting BP 104/64     Resting Oxygen Saturation  97 %     Exercise Oxygen Saturation  during 6 min walk 96 %     Max Ex. HR 105 bpm     Max Ex. BP 112/66     2 Minute Post BP 102/66       Interval HR   1 Minute HR 99     2 Minute HR 102     3 Minute HR 105     4 Minute HR 102     5 Minute HR 103     6 Minute HR 101     2 Minute Post HR 87     Interval Heart Rate? Yes       Interval Oxygen   Interval Oxygen? Yes     Baseline Oxygen Saturation % 97 %     1 Minute Oxygen Saturation % 96 %     1 Minute Liters of Oxygen 0 L  RA     2 Minute Oxygen Saturation % 97 %     2 Minute Liters of Oxygen 0 L     3 Minute Oxygen Saturation % 96 %     3 Minute Liters of Oxygen 0  L     4 Minute Oxygen Saturation % 96 %     4 Minute Liters of Oxygen 0 L     5 Minute Oxygen Saturation % 97 %     5 Minute Liters of Oxygen 0 L     6 Minute Oxygen Saturation % 96 %     6 Minute Liters of Oxygen 0 L     2 Minute Post Oxygen Saturation % 98 %     2 Minute Post Liters of Oxygen 0 L             Oxygen Initial Assessment:  Oxygen Initial Assessment - 07/08/21 1124       Home Oxygen   Home Oxygen  Device None    Sleep Oxygen Prescription CPAP    Home Exercise Oxygen Prescription None    Home Resting Oxygen Prescription None    Compliance with Home Oxygen Use Yes      Initial 6 min Walk   Oxygen Used None      Program Oxygen Prescription   Program Oxygen Prescription None      Intervention   Short Term Goals To learn and exhibit compliance with exercise, home and travel O2 prescription;To learn and understand importance of monitoring SPO2 with pulse oximeter and demonstrate accurate use of the pulse oximeter.;To learn and understand importance of maintaining oxygen saturations>88%;To learn and demonstrate proper pursed lip breathing techniques or other breathing techniques. ;To learn and demonstrate proper use of respiratory medications    Long  Term Goals Exhibits compliance with exercise, home  and travel O2 prescription;Maintenance of O2 saturations>88%;Exhibits proper breathing techniques, such as pursed lip breathing or other method taught during program session;Verbalizes importance of monitoring SPO2 with pulse oximeter and return demonstration;Compliance with respiratory medication             Oxygen Re-Evaluation:  Oxygen Re-Evaluation     Row Name 07/14/21 1535 09/07/21 1139 10/07/21 1227         Program Oxygen Prescription   Program Oxygen Prescription None None None       Home Oxygen   Home Oxygen Device None None None     Sleep Oxygen Prescription CPAP CPAP CPAP     Home Exercise Oxygen Prescription None None None     Home Resting Oxygen Prescription None None None     Compliance with Home Oxygen Use Yes -- Yes       Goals/Expected Outcomes   Short Term Goals To learn and exhibit compliance with exercise, home and travel O2 prescription;To learn and understand importance of monitoring SPO2 with pulse oximeter and demonstrate accurate use of the pulse oximeter.;To learn and understand importance of maintaining oxygen saturations>88%;To learn and demonstrate  proper pursed lip breathing techniques or other breathing techniques. ;To learn and demonstrate proper use of respiratory medications To learn and exhibit compliance with exercise, home and travel O2 prescription;To learn and understand importance of monitoring SPO2 with pulse oximeter and demonstrate accurate use of the pulse oximeter.;To learn and understand importance of maintaining oxygen saturations>88%;To learn and demonstrate proper pursed lip breathing techniques or other breathing techniques. ;To learn and demonstrate proper use of respiratory medications To learn and exhibit compliance with exercise, home and travel O2 prescription;To learn and understand importance of monitoring SPO2 with pulse oximeter and demonstrate accurate use of the pulse oximeter.;To learn and understand importance of maintaining oxygen saturations>88%;To learn and demonstrate proper pursed lip breathing techniques or other breathing techniques. ;To learn and demonstrate  proper use of respiratory medications     Long  Term Goals Exhibits compliance with exercise, home  and travel O2 prescription;Maintenance of O2 saturations>88%;Exhibits proper breathing techniques, such as pursed lip breathing or other method taught during program session;Verbalizes importance of monitoring SPO2 with pulse oximeter and return demonstration;Compliance with respiratory medication Exhibits compliance with exercise, home  and travel O2 prescription;Maintenance of O2 saturations>88%;Exhibits proper breathing techniques, such as pursed lip breathing or other method taught during program session;Verbalizes importance of monitoring SPO2 with pulse oximeter and return demonstration;Compliance with respiratory medication Exhibits compliance with exercise, home  and travel O2 prescription;Maintenance of O2 saturations>88%;Exhibits proper breathing techniques, such as pursed lip breathing or other method taught during program session;Verbalizes importance of  monitoring SPO2 with pulse oximeter and return demonstration;Compliance with respiratory medication     Comments Reviewed PLB technique with pt.  Talked about how it works and it's importance in maintaining their exercise saturations. Mandy Jones uses her CPAP most nights but can tell a difference when she doesnt.  She has a visit at the HF clinic today. Mandy Jones uses her CPAP most nights but can tell a difference when she doesnt.  She has a visit at the HF clinic today 10/07/21.     Goals/Expected Outcomes Short: Become more profiecient at using PLB.   Long: Become independent at using PLB. Short: be consistent using CPAP Long: use PLB as needed ST: be consistent using CPAP LT: use PLB as needed              Oxygen Discharge (Final Oxygen Re-Evaluation):  Oxygen Re-Evaluation - 10/07/21 1227       Program Oxygen Prescription   Program Oxygen Prescription None      Home Oxygen   Home Oxygen Device None    Sleep Oxygen Prescription CPAP    Home Exercise Oxygen Prescription None    Home Resting Oxygen Prescription None    Compliance with Home Oxygen Use Yes      Goals/Expected Outcomes   Short Term Goals To learn and exhibit compliance with exercise, home and travel O2 prescription;To learn and understand importance of monitoring SPO2 with pulse oximeter and demonstrate accurate use of the pulse oximeter.;To learn and understand importance of maintaining oxygen saturations>88%;To learn and demonstrate proper pursed lip breathing techniques or other breathing techniques. ;To learn and demonstrate proper use of respiratory medications    Long  Term Goals Exhibits compliance with exercise, home  and travel O2 prescription;Maintenance of O2 saturations>88%;Exhibits proper breathing techniques, such as pursed lip breathing or other method taught during program session;Verbalizes importance of monitoring SPO2 with pulse oximeter and return demonstration;Compliance with respiratory medication    Comments Mandy Jones  uses her CPAP most nights but can tell a difference when she doesnt.  She has a visit at the HF clinic today 10/07/21.    Goals/Expected Outcomes ST: be consistent using CPAP LT: use PLB as needed             Initial Exercise Prescription:  Initial Exercise Prescription - 07/08/21 1300       Date of Initial Exercise RX and Referring Provider   Date 07/08/21    Referring Provider Serafina Royals MD      Oxygen   Maintain Oxygen Saturation 88% or higher      Recumbant Bike   Level 1    RPM 60    Watts 15    Minutes 15    METs 1.1      NuStep  Level 1    SPM 80    Minutes 15    METs 1.1      Track   Laps 16    Minutes 15    METs 1.87      Prescription Details   Frequency (times per week) 2    Duration Progress to 30 minutes of continuous aerobic without signs/symptoms of physical distress      Intensity   THRR 40-80% of Max Heartrate 106-134    Ratings of Perceived Exertion 11-13    Perceived Dyspnea 0-4      Progression   Progression Continue to progress workloads to maintain intensity without signs/symptoms of physical distress.      Resistance Training   Training Prescription Yes    Weight 3 lb    Reps 10-15             Perform Capillary Blood Glucose checks as needed.  Exercise Prescription Changes:   Exercise Prescription Changes     Row Name 07/08/21 1300 07/26/21 1400 08/09/21 1000 09/06/21 1100 09/20/21 1200     Response to Exercise   Blood Pressure (Admit) 104/64 120/64 110/60 128/68 116/60   Blood Pressure (Exercise) 112/66 120/66 114/64 106/60 --   Blood Pressure (Exit) 102/66 104/64 106/64 110/60 118/64   Heart Rate (Admit) 79 bpm 91 bpm 94 bpm 88 bpm 84 bpm   Heart Rate (Exercise) 105 bpm 100 bpm 109 bpm 105 bpm 114 bpm   Heart Rate (Exit) 85 bpm 89 bpm 94 bpm 90 bpm 104 bpm   Oxygen Saturation (Admit) 97 % 97 % 97 % 98 % 98 %   Oxygen Saturation (Exercise) 96 % 95 % 95 % 96 % 96 %   Oxygen Saturation (Exit) 98 % 96 % 96 % 96 % 96  %   Rating of Perceived Exertion (Exercise) '12 11 12 13 15   ' Perceived Dyspnea (Exercise) 2 -- -- 3 3   Symptoms Unsteady on feet -- none SOB SOB   Comments walk test results third day -- -- --   Duration -- Progress to 30 minutes of  aerobic without signs/symptoms of physical distress Continue with 30 min of aerobic exercise without signs/symptoms of physical distress. Continue with 30 min of aerobic exercise without signs/symptoms of physical distress. Continue with 30 min of aerobic exercise without signs/symptoms of physical distress.   Intensity -- THRR unchanged THRR unchanged THRR unchanged THRR unchanged     Progression   Progression -- Continue to progress workloads to maintain intensity without signs/symptoms of physical distress. Continue to progress workloads to maintain intensity without signs/symptoms of physical distress. Continue to progress workloads to maintain intensity without signs/symptoms of physical distress. Continue to progress workloads to maintain intensity without signs/symptoms of physical distress.   Average METs -- 1.85 1.94 1.62 1.94     Resistance Training   Training Prescription -- Yes Yes Yes Yes   Weight -- 3 lb 3 lb 3 lb 3 lb   Reps -- 10-15 10-15 10-15 10-15     Interval Training   Interval Training -- -- No No No     NuStep   Level -- '1 2 3 1   ' SPM -- 80 -- -- --   Minutes -- '15 15 15 15   ' METs -- -- 2.1 1.9 2.2     REL-XR   Level -- -- -- 1 3   Minutes -- -- -- 15 15   METs -- -- -- 2.1 2  T5 Nustep   Level -- -- 1 -- --   Minutes -- -- 15 -- --   METs -- -- 1.9 -- --     Biostep-RELP   Level -- -- -- 1 4   Minutes -- -- -- 15 15   METs -- -- -- 1 2     Track   Laps -- '5 15 5 10   ' Minutes -- '15 15 10 15   ' METs -- 1.27 1.82 1.5 1.54     Home Exercise Plan   Plans to continue exercise at -- -- -- -- Home (comment)  walking   Frequency -- -- -- -- Add 1 additional day to program exercise sessions.   Initial Home Exercises  Provided -- -- -- -- 09/07/21     Oxygen   Maintain Oxygen Saturation -- -- 88% or higher 88% or higher 88% or higher    Row Name 10/04/21 1300 10/18/21 1300           Response to Exercise   Blood Pressure (Admit) 100/60 --      Blood Pressure (Exercise) 110/62 --      Blood Pressure (Exit) 102/64 102/62      Heart Rate (Admit) 82 bpm 97 bpm      Heart Rate (Exercise) 105 bpm 107 bpm      Heart Rate (Exit) 92 bpm 110 bpm      Oxygen Saturation (Admit) 98 % 93 %      Oxygen Saturation (Exercise) 92 % 93 %      Oxygen Saturation (Exit) 92 % 95 %      Rating of Perceived Exertion (Exercise) 13 14      Perceived Dyspnea (Exercise) 2 3      Symptoms SOB SOB      Duration Continue with 30 min of aerobic exercise without signs/symptoms of physical distress. Continue with 30 min of aerobic exercise without signs/symptoms of physical distress.      Intensity THRR unchanged THRR unchanged        Progression   Progression Continue to progress workloads to maintain intensity without signs/symptoms of physical distress. Continue to progress workloads to maintain intensity without signs/symptoms of physical distress.      Average METs 1.83 1.9        Resistance Training   Training Prescription Yes Yes      Weight 3 lb 2 lb      Reps 10-15 10-15        Interval Training   Interval Training No No        NuStep   Level 2 --      Minutes 15 --        REL-XR   Level 3 --      Minutes 15 --      METs 2 --        Biostep-RELP   Level 1 2      Minutes 15 15      METs 2 2        Track   Laps 9 10      Minutes 15 15      METs 1.49 1.54        Home Exercise Plan   Plans to continue exercise at Home (comment)  walking Home (comment)  walking      Frequency Add 1 additional day to program exercise sessions. Add 1 additional day to program exercise sessions.      Initial Home Exercises  Provided 09/07/21 09/07/21        Oxygen   Maintain Oxygen Saturation 88% or higher 88% or higher                Exercise Comments:   Exercise Goals and Review:   Exercise Goals     Row Name 07/08/21 1358             Exercise Goals   Increase Physical Activity Yes       Intervention Provide advice, education, support and counseling about physical activity/exercise needs.;Develop an individualized exercise prescription for aerobic and resistive training based on initial evaluation findings, risk stratification, comorbidities and participant's personal goals.       Expected Outcomes Short Term: Attend rehab on a regular basis to increase amount of physical activity.;Long Term: Add in home exercise to make exercise part of routine and to increase amount of physical activity.;Long Term: Exercising regularly at least 3-5 days a week.       Increase Strength and Stamina Yes       Intervention Provide advice, education, support and counseling about physical activity/exercise needs.;Develop an individualized exercise prescription for aerobic and resistive training based on initial evaluation findings, risk stratification, comorbidities and participant's personal goals.       Expected Outcomes Short Term: Increase workloads from initial exercise prescription for resistance, speed, and METs.;Short Term: Perform resistance training exercises routinely during rehab and add in resistance training at home;Long Term: Improve cardiorespiratory fitness, muscular endurance and strength as measured by increased METs and functional capacity (6MWT)       Able to understand and use rate of perceived exertion (RPE) scale Yes       Intervention Provide education and explanation on how to use RPE scale       Expected Outcomes Short Term: Able to use RPE daily in rehab to express subjective intensity level;Long Term:  Able to use RPE to guide intensity level when exercising independently       Able to understand and use Dyspnea scale Yes       Intervention Provide education and explanation on how to use  Dyspnea scale       Expected Outcomes Short Term: Able to use Dyspnea scale daily in rehab to express subjective sense of shortness of breath during exertion;Long Term: Able to use Dyspnea scale to guide intensity level when exercising independently       Knowledge and understanding of Target Heart Rate Range (THRR) Yes       Intervention Provide education and explanation of THRR including how the numbers were predicted and where they are located for reference       Expected Outcomes Short Term: Able to state/look up THRR;Long Term: Able to use THRR to govern intensity when exercising independently;Short Term: Able to use daily as guideline for intensity in rehab       Able to check pulse independently Yes       Intervention Provide education and demonstration on how to check pulse in carotid and radial arteries.;Review the importance of being able to check your own pulse for safety during independent exercise       Expected Outcomes Short Term: Able to explain why pulse checking is important during independent exercise;Long Term: Able to check pulse independently and accurately       Understanding of Exercise Prescription Yes       Intervention Provide education, explanation, and written materials on patient's individual exercise prescription  Expected Outcomes Short Term: Able to explain program exercise prescription;Long Term: Able to explain home exercise prescription to exercise independently                Exercise Goals Re-Evaluation :  Exercise Goals Re-Evaluation     Row Name 07/14/21 1538 07/26/21 1501 08/09/21 1022 09/06/21 1057 09/07/21 1126     Exercise Goal Re-Evaluation   Exercise Goals Review Increase Physical Activity;Able to understand and use rate of perceived exertion (RPE) scale;Knowledge and understanding of Target Heart Rate Range (THRR);Understanding of Exercise Prescription;Increase Strength and Stamina;Able to check pulse independently;Able to understand and use  Dyspnea scale Increase Physical Activity;Increase Strength and Stamina Increase Physical Activity;Increase Strength and Stamina Increase Physical Activity;Increase Strength and Stamina;Understanding of Exercise Prescription Increase Physical Activity;Increase Strength and Stamina   Comments Reviewed RPE and dyspnea scales, THR and program prescription with pt today.  Pt voiced understanding and was given a copy of goals to take home. Mandy Jones has tolerated exercise well in her first sessions. Oxygen has stayed in mid 90s during all sessions. Mandy Jones is doing well in rehab. Her attendance has not been consistent, however, she is switching class times which should help her stay consistent. She has increased to level 2 on the T4 Nustep and continues to walk the track. She does hit her [place] when walking. Will continue to monitor progression once her attendance is better. Mandy Jones is doing well in rehab.  She is now walking some in class for 5 laps.  She has also done 30 min on the NuStep.  We will continue to montior her progress. Mandy Jones is doing some exercise for legs   Expected Outcomes Short: Use RPE daily to regulate intensity. Long: Follow program prescription in THR. Short: attend consistently Long: build overall stamina Short: Maintain consistent attendance Long: Increase overall strength and MET level Short: Continue to attend regularly Long: Continue to improve stamina --    Row Name 09/07/21 1138 09/20/21 1216 10/04/21 1316 10/07/21 1120 10/18/21 1329     Exercise Goal Re-Evaluation   Exercise Goals Review -- Increase Physical Activity;Increase Strength and Stamina;Understanding of Exercise Prescription Increase Physical Activity;Increase Strength and Stamina;Understanding of Exercise Prescription Increase Physical Activity;Increase Strength and Stamina;Understanding of Exercise Prescription Increase Physical Activity;Increase Strength and Stamina   Comments Reviewed home exercise with pt today.  Pt plans to use  staff videos and consider joining the [facility] for exercise.  Reviewed THR, pulse, RPE, sign and symptoms, pulse oximetery and when to call 911 or MD.  Also discussed weather considerations and indoor options.  Pt voiced understanding. Mandy Jones is doing well in rehab.  She is now walking more and up to 10 laps.  She has also moved up the XR and BioStep.  We will continue to monitor her progress. Mandy Jones is doing well in rehab.She is now up to level 3 on the XR.  We will conintue to monitor her progress. Mandy Jones would like join the [employer] by the end of the month - she has silver sneakers. Right now she does some exercises at home, she tries to stay active such as taking a cart when in the store and walking around. She reports her balance is what worries her. Mandy Jones has been attending more consistently.  She reaches her THR range some sessions.  Staff wil review THR range and importance of monitoring during exercise.   Expected Outcomes Short: add one day in addition to program sessions Long:  maintain exercise independently Short: Continue to add in more  time walking Long: Continue to improve stamina Short: Maintain workloads versus up and down Long: Continue to improve stamina Short: join [employer] by the end of the month Long: Continue to improve stamina Short: know THR range and work in correct range for exercise Long: build overall stamina            Discharge Exercise Prescription (Final Exercise Prescription Changes):  Exercise Prescription Changes - 10/18/21 1300       Response to Exercise   Blood Pressure (Exit) 102/62    Heart Rate (Admit) 97 bpm    Heart Rate (Exercise) 107 bpm    Heart Rate (Exit) 110 bpm    Oxygen Saturation (Admit) 93 %    Oxygen Saturation (Exercise) 93 %    Oxygen Saturation (Exit) 95 %    Rating of Perceived Exertion (Exercise) 14    Perceived Dyspnea (Exercise) 3    Symptoms SOB    Duration Continue with 30 min of aerobic exercise without signs/symptoms of physical  distress.    Intensity THRR unchanged      Progression   Progression Continue to progress workloads to maintain intensity without signs/symptoms of physical distress.    Average METs 1.9      Resistance Training   Training Prescription Yes    Weight 2 lb    Reps 10-15      Interval Training   Interval Training No      Biostep-RELP   Level 2    Minutes 15    METs 2      Track   Laps 10    Minutes 15    METs 1.54      Home Exercise Plan   Plans to continue exercise at Home (comment)   walking   Frequency Add 1 additional day to program exercise sessions.    Initial Home Exercises Provided 09/07/21      Oxygen   Maintain Oxygen Saturation 88% or higher             Nutrition:  Target Goals: Understanding of nutrition guidelines, daily intake of sodium <1580m, cholesterol <207m calories 30% from fat and 7% or less from saturated fats, daily to have 5 or more servings of fruits and vegetables.  Education: All About Nutrition: -Group instruction provided by verbal, written material, interactive activities, discussions, models, and posters to present general guidelines for heart healthy nutrition including fat, fiber, MyPlate, the role of sodium in heart healthy nutrition, utilization of the nutrition label, and utilization of this knowledge for meal planning. Follow up email sent as well. Written material given at graduation. [facility]  Education need identified 07/08/21       Biometrics:  Pre Biometrics - 07/08/21 1122       Pre Biometrics   Height 5' 2.25" (1.581 m)    Weight 227 lb 14.4 oz (103.4 kg)    BMI (Calculated) 41.36    Single Leg Stand 8.94 seconds              Nutrition Therapy Plan and Nutrition Goals:  Nutrition Therapy & Goals - 07/29/21 1333       Nutrition Therapy   Diet Heart healthy, low Na    Protein (specify units) 85g    Fiber 25 grams    Whole Grain  Foods 3 servings    Saturated Fats 12 max. grams    Fruits and Vegetables 8 servings/day    Sodium 1.5  grams      Personal Nutrition Goals   Nutrition Goal ST: calculate sodium intake for 3 random days, use <1/4 tsp salt when cooking LT: limit Na < 1.5g/day    Comments B: something small - whole grain cereal or eggs/peppers/potatoes L: "hit or miss" every other day. leftovers mostly or [employer] if she is on the road (grilled chicken sandwich) - 2x/week D: mostly vegetables (seasonally, colorful, salads, sometimes sweet potatoes, all squashes) with small amount of protein - size of her palm (chicken, pork, fish - sea bass, salmon, tuna, shrimp) as well as ancient grains. She makes a lot of stirfries she uses glass noodles. And will sometimes have spaghetti. She uses olive oil and some butter. She salts her food sometimes while cooking (max 1/2 tsp) and salt-free seasonings. Drinks: 2-3 cups of coffee (Oatmilk cream and sweetener), water and seltzer, tea (black). She tries to not keep sugar in the house becuase she feels like it is a problem for her. Reviewed heart healthy eating and      Intervention Plan   Intervention Prescribe, educate and counsel regarding individualized specific dietary modifications aiming towards targeted core components such as weight, hypertension, lipid management, diabetes, heart failure and other comorbidities.;Nutrition handout(s) given to patient.    Expected Outcomes Short Term Goal: Understand basic principles of dietary content, such as calories, fat, sodium, cholesterol and nutrients.;Short Term Goal: A plan has been developed with personal nutrition goals set during dietitian appointment.;Long Term Goal: Adherence to prescribed nutrition plan.             Nutrition Assessments:  MEDIFICTS Score Key: ?70 Need to make dietary changes  40-70 Heart Healthy Diet ? 40 Therapeutic Level Cholesterol Diet  Flowsheet Row Pulmonary Rehab from 07/08/2021 in [facility]  Cardiac and Pulmonary Rehab  Picture Your Plate Total Score on Admission 65      Picture Your Plate Scores: <70 Unhealthy dietary pattern with much room for improvement. 41-50 Dietary pattern unlikely to meet recommendations for good health and room for improvement. 51-60 More healthful dietary pattern, with some room for improvement.  >60 Healthy dietary pattern, although there may be some specific behaviors that could be improved.   Nutrition Goals Re-Evaluation:  Nutrition Goals Re-Evaluation     [place] Name 09/07/21 1121 10/07/21 1131           Goals   Nutrition Goal -- ST: try tempeh, add protein to lunch and dinner, suggested 1 calcium rich food per day LT: limit Na < 1.5g/day      Comment Mandy Jones reports not eating much sodium at all.  She doesnt eat pre packaged foods and prefers to cook at home.  She does follow Drs that recommend whole food plant based eating. Mandy Jones has began a mostly whole food plant-based diet, but still eats some animal products such as chicken, fish, and eggs. She will have eggs with potatoes and peppers in the morning, a big salad for lunch with lots of different vegetables, olives, walnuts. For dinner she will have fruit with peanut butter sometimes. 3x/week she will have yogurt. She is discouraged she is not losing weight, discussed changes made will benefit her health outside of weight loss. Suggested including good source of protein for lunch and dinner such as peanut butter at dinner consistently and beans/tempeh as plant-based sources of protein at lunch. She reports that she does not like tofu, suggested trying out tempeh. She takes b-12 and vitamin D. Discussed calcium rich foods to include like fortified  plant milk, tahini, figs, leafy greens, soy products.      Expected Outcome Short: continue to watch sodium Long: maintain heart healthy diet ST: try tempeh, add protein to lunch and dinner, suggested 1 calcium rich food per day LT: limit Na < 1.5g/day                Nutrition Goals Discharge (Final Nutrition Goals Re-Evaluation):  Nutrition Goals Re-Evaluation - 10/07/21 1131       Goals   Nutrition Goal ST: try tempeh, add protein to lunch and dinner, suggested 1 calcium rich food per day LT: limit Na < 1.5g/day    Comment Mandy Jones has began a mostly whole food plant-based diet, but still eats some animal products such as chicken, fish, and eggs. She will have eggs with potatoes and peppers in the morning, a big salad for lunch with lots of different vegetables, olives, walnuts. For dinner she will have fruit with peanut butter sometimes. 3x/week she will have yogurt. She is discouraged she is not losing weight, discussed changes made will benefit her health outside of weight loss. Suggested including good source of protein for lunch and dinner such as peanut butter at dinner consistently and beans/tempeh as plant-based sources of protein at lunch. She reports that she does not like tofu, suggested trying out tempeh. She takes b-12 and vitamin D. Discussed calcium rich foods to include like fortified plant milk, tahini, figs, leafy greens, soy products.    Expected Outcome ST: try tempeh, add protein to lunch and dinner, suggested 1 calcium rich food per day LT: limit Na < 1.5g/day             Psychosocial: Target Goals: Acknowledge presence or absence of significant depression and/or stress, maximize coping skills, provide positive support system. Participant is able to verbalize types and ability to use techniques and skills needed for reducing stress and depression.   Education: Stress, Anxiety, and Depression - Group verbal and visual presentation to define topics covered.  Reviews how body is impacted by stress, anxiety, and depression.  Also discusses healthy ways to reduce stress and to treat/manage anxiety and depression.  Written material given at graduation. Flowsheet Row Pulmonary Rehab from 10/14/2021 in [facility] Cardiac and Pulmonary Rehab   Date 10/14/21  Educator [facility]  Instruction Review Code 1- [employer] Understanding       Education: Sleep Hygiene -Provides group verbal and written instruction about how sleep can affect your health.  Define sleep hygiene, discuss sleep cycles and impact of sleep habits. Review good sleep hygiene tips.    Initial Review & Psychosocial Screening:  Initial Psych Review & Screening - 07/05/21 1413       Initial Review   Current issues with Current Sleep Concerns;Current Stress Concerns    Source of Stress Concerns Chronic Illness;Unable to participate in former interests or hobbies;Unable to perform yard/household activities      [place]? No    Concerns No support system      Barriers   Psychosocial barriers to participate in program There are no identifiable barriers or psychosocial needs.      Screening Interventions   Interventions Encouraged to exercise;Provide feedback about the scores to participant;To provide support and resources with identified psychosocial needs    Expected Outcomes Short Term goal: Utilizing psychosocial counselor, staff and physician to assist with identification of specific Stressors or current issues interfering with healing process. Setting desired goal for each stressor or current issue identified.;Long  Term Goal: Stressors or current issues are controlled or eliminated.;Short Term goal: Identification and review with participant of any Quality of Life or Depression concerns found by scoring the questionnaire.;Long Term goal: The participant improves quality of Life and PHQ9 Scores as seen by post scores and/or verbalization of changes             Quality of Life Scores:  Scores of 19 and below usually indicate a poorer quality of life in these areas.  A difference of  2-3 points is a clinically meaningful difference.  A difference of 2-3 points in the total score of the Quality of Life Index has been associated with  significant improvement in overall quality of life, self-image, physical symptoms, and general health in studies assessing change in quality of life.  PHQ-9: Recent Review Flowsheet Data     Depression screen [facility] 2/9 09/07/2021 08/31/2021 07/28/2021 07/08/2021 03/03/2021   Decreased Interest 1 1 0 1 0   Down, Depressed, Hopeless 3 2 0 2 1   PHQ - 2 Score 4 3 0 3 1   Altered sleeping 0 1 - 3 -   Tired, decreased energy 2 3 - 3 -   Change in appetite 0 2 - 2 -   Feeling bad or failure about yourself  2 2 - 1 -   Trouble concentrating 1 2 - 1 -   Moving slowly or fidgety/restless 0 2 - 1 -   Suicidal thoughts 0 0 - 0 -   PHQ-9 Score 9 15 - 14 -   Difficult doing work/chores Somewhat difficult Very difficult - Somewhat difficult -      Interpretation of Total Score  Total Score Depression Severity:  1-4 = Minimal depression, 5-9 = Mild depression, 10-14 = Moderate depression, 15-19 = Moderately severe depression, 20-27 = Severe depression   Psychosocial Evaluation and Intervention:  Psychosocial Evaluation - 07/05/21 1424       Psychosocial Evaluation & Interventions   Interventions Stress management education;Encouraged to exercise with the program and follow exercise prescription    Comments Iniya reports her heart failure symptoms have been getting in the way of her daily life for the last 5 years. She feels like she can't participate in her hobbies or take her dogs places like she used to. Her mother passed away in the last two years and her only brother that is close by doesn't speak to her. [place] with her dogs, so she states she doesn't have a real support system. She was exercising regularly trying to lose weight but her heart failure symptoms have gotten in the way and now she is up to a weight she has never been before and is hating it. She wants to feel better and increase her stamina as well as lose weight. She does have some vestibular issues  post the second Covid vaccine that also causes some balance issues.She takes medication to sleep but also takes a diuretic so she feels like she is constantly in the bathroom. She does have a copay so she may not be able to complete the total number of sessions, but she is very motivated to do whatever it takes to gain some control back in her life.    Expected Outcomes Short; attend Pulmonary Rehab for exercise and education. Long; develop and maintain positive self care habits.    Continue Psychosocial Services  Follow up required by staff  Psychosocial Re-Evaluation:  Psychosocial Re-Evaluation     [place] Name 08/31/21 1129 10/07/21 1125           Psychosocial Re-Evaluation   Current issues with Current Stress Concerns;Current Anxiety/Panic;Current Depression Current Stress Concerns;Current Anxiety/Panic;Current Depression      Comments Reviewed patient health questionnaire (PHQ-9) with patient for follow up. Previously, patients score indicated signs/symptoms of depression.  Reviewed to see if patient is improving symptom wise while in program.  Score declined and patient states that it is because they have continued to have depression symptoms. She has not been able to get a hold of a therapist that would take new pts/seniors.  We talked about trying to go through her PCP, but she is out on maternity leave; so recommended to try cardiologist. We also gave her our current list of therapist in the area. She reports stress over things she can no longer do, she hasn't tried out therapy yet becuase they do not take her insurance; the therapist she was seeing retired and a lot of places don't take seniors. Her PCP continues to be out on maternity leave. She has a list that she has to go through to call. She has dogs at home which reduces her stress.      Expected Outcomes Short: get set up with therapist and talk to doctor about symptoms Long: conitnue to exercise for mental boost. Short:  get set up with therapist and talk to doctor about symptoms Long: conitnue to exercise for mental boost.      Interventions Stress management education;Encouraged to attend Pulmonary Rehabilitation for the exercise Stress management education;Encouraged to attend Pulmonary Rehabilitation for the exercise      Continue Psychosocial Services  Follow up required by staff Follow up required by staff        Initial Review   Source of Stress Concerns -- Chronic Illness;Unable to participate in former interests or hobbies;Unable to perform yard/household activities               Psychosocial Discharge (Final Psychosocial Re-Evaluation):  Psychosocial Re-Evaluation - 10/07/21 1125       Psychosocial Re-Evaluation   Current issues with Current Stress Concerns;Current Anxiety/Panic;Current Depression    Comments She reports stress over things she can no longer do, she hasn't tried out therapy yet becuase they do not take her insurance; the therapist she was seeing retired and a lot of places don't take seniors. Her PCP continues to be out on maternity leave. She has a list that she has to go through to call. She has dogs at home which reduces her stress.    Expected Outcomes Short: get set up with therapist and talk to doctor about symptoms Long: conitnue to exercise for mental boost.    Interventions Stress management education;Encouraged to attend Pulmonary Rehabilitation for the exercise    Continue Psychosocial Services  Follow up required by staff      Initial Review   Source of Stress Concerns Chronic Illness;Unable to participate in former interests or hobbies;Unable to perform yard/household activities             Education: Education Goals: Education classes will be provided on a weekly basis, covering required topics. Participant will state understanding/return demonstration of topics presented.  Learning Barriers/Preferences:  Learning Barriers/Preferences - 07/05/21 1413        Learning Barriers/Preferences   Learning Barriers None    Learning Preferences None             General  Pulmonary Education Topics:  Infection Prevention: - Provides verbal and written material to individual with discussion of infection control including proper hand washing and proper equipment cleaning during exercise session. Flowsheet Row Pulmonary Rehab from 10/14/2021 in [facility] Cardiac and Pulmonary Rehab  Education need identified 07/08/21  Date 07/08/21  Educator [place]  Instruction Review Code 1- Verbalizes Understanding       Falls Prevention: - Provides verbal and written material to individual with discussion of falls prevention and safety. Flowsheet Row Pulmonary Rehab from 10/14/2021 in [facility] Cardiac and Pulmonary Rehab  Education need identified 07/08/21  Date 07/08/21  Educator [place]  Instruction Review Code 1- Verbalizes Understanding       Chronic Lung Disease Review: - Group verbal instruction with posters, models, PowerPoint presentations and videos,  to review new updates, new respiratory medications, new advancements in procedures and treatments. Providing information on websites and "800" numbers for continued self-education. Includes information about supplement oxygen, available portable oxygen systems, continuous and intermittent flow rates, oxygen safety, concentrators, and Medicare reimbursement for oxygen. Explanation of Pulmonary Drugs, including class, frequency, complications, importance of spacers, rinsing mouth after steroid MDI's, and proper cleaning methods for nebulizers. Review of basic lung anatomy and physiology related to function, structure, and complications of lung disease. Review of risk factors. Discussion about methods for diagnosing sleep apnea and types of masks and machines for OSA. Includes a review of the use of types of environmental controls: home humidity, furnaces, filters, dust mite/pet prevention, HEPA vacuums. Discussion about  weather changes, air quality and the benefits of nasal washing. Instruction on Warning signs, infection symptoms, calling MD promptly, preventive modes, and value of vaccinations. Review of effective airway clearance, coughing and/or vibration techniques. Emphasizing that all should Create an Action Plan. Written material given at graduation. Flowsheet Row Pulmonary Rehab from 10/14/2021 in [facility] Cardiac and Pulmonary Rehab  Date 08/04/21  Educator [facility]  Instruction Review Code 1- Verbalizes Understanding       AED/CPR: - Group verbal and written instruction with the use of models to demonstrate the basic use of the AED with the basic ABC's of resuscitation.    Anatomy and Cardiac Procedures: - Group verbal and visual presentation and models provide information about basic cardiac anatomy and function. Reviews the testing methods done to diagnose heart disease and the outcomes of the test results. Describes the treatment choices: Medical Management, Angioplasty, or Coronary Bypass Surgery for treating various heart conditions including Myocardial Infarction, Angina, Valve Disease, and Cardiac Arrhythmias.  Written material given at graduation. Flowsheet Row Pulmonary Rehab from 10/14/2021 in [facility] Cardiac and Pulmonary Rehab  Date 09/02/21  Educator [place]  Instruction Review Code 1- Verbalizes Understanding       Medication Safety: - Group verbal and visual instruction to review commonly prescribed medications for heart and lung disease. Reviews the medication, class of the drug, and side effects. Includes the steps to properly store meds and maintain the prescription regimen.  Written material given at graduation. Flowsheet Row Pulmonary Rehab from 10/14/2021 in [facility] Cardiac and Pulmonary Rehab  Date 09/16/21  Educator SB  Instruction Review Code 1- Verbalizes Understanding       Other: -Provides group and verbal instruction on various topics (see comments)   Knowledge Questionnaire  Score:  Knowledge Questionnaire Score - 07/08/21 1120       Knowledge Questionnaire Score   Pre Score 23/26 (cardiac): Diabetes, Nutrition, Aerobic              Core Components/Risk Factors/Patient  Goals at Admission:  Personal Goals and Risk Factors at Admission - 07/08/21 1358       Core Components/Risk Factors/Patient Goals on Admission    Weight Management Yes;Weight Loss    Intervention Weight Management: Develop a combined nutrition and exercise program designed to reach desired caloric intake, while maintaining appropriate intake of nutrient and fiber, sodium and fats, and appropriate energy expenditure required for the weight goal.;Weight Management: Provide education and appropriate resources to help participant work on and attain dietary goals.;Obesity: Provide education and appropriate resources to help participant work on and attain dietary goals.    Admit Weight 227 lb (103 kg)    Goal Weight: Short Term 222 lb (100.7 kg)    Goal Weight: Long Term 150 lb (68 kg)    Expected Outcomes Short Term: Continue to assess and modify interventions until short term weight is achieved;Long Term: Adherence to nutrition and physical activity/exercise program aimed toward attainment of established weight goal;Weight Loss: Understanding of general recommendations for a balanced deficit meal plan, which promotes 1-2 lb weight loss per week and includes a negative energy balance of [phone] kcal/d;Understanding recommendations for meals to include 15-35% energy as protein, 25-35% energy from fat, 35-60% energy from carbohydrates, less than 292m of dietary cholesterol, 20-35 gm of total fiber daily;Understanding of distribution of calorie intake throughout the day with the consumption of 4-5 meals/snacks    Heart Failure Yes    Intervention Provide a combined exercise and nutrition program that is supplemented with education, support and counseling about heart failure. Directed toward relieving  symptoms such as shortness of breath, decreased exercise tolerance, and extremity edema.    Expected Outcomes Short term: Attendance in program 2-3 days a week with increased exercise capacity. Reported lower sodium intake. Reported increased fruit and vegetable intake. Reports medication compliance.;Improve functional capacity of life;Short term: Daily weights obtained and reported for increase. Utilizing diuretic protocols set by physician.;Long term: Adoption of self-care skills and reduction of barriers for early signs and symptoms recognition and intervention leading to self-care maintenance.    Hypertension Yes    Intervention Provide education on lifestyle modifcations including regular physical activity/exercise, weight management, moderate sodium restriction and increased consumption of fresh fruit, vegetables, and low fat dairy, alcohol moderation, and smoking cessation.;Monitor prescription use compliance.    Expected Outcomes Short Term: Continued assessment and intervention until BP is < 140/920mHG in hypertensive participants. < 130/8078mG in hypertensive participants with diabetes, heart failure or chronic kidney disease.;Long Term: Maintenance of blood pressure at goal levels.    Lipids Yes    Intervention Provide education and support for participant on nutrition & aerobic/resistive exercise along with prescribed medications to achieve LDL <18m9mDL >40mg36m Expected Outcomes Short Term: Participant states understanding of desired cholesterol values and is compliant with medications prescribed. Participant is following exercise prescription and nutrition guidelines.;Long Term: Cholesterol controlled with medications as prescribed, with individualized exercise RX and with personalized nutrition plan. Value goals: LDL < 18mg,63m > 40 mg.             Education:Diabetes - Individual verbal and written instruction to review signs/symptoms of diabetes, desired ranges of glucose level  fasting, after meals and with exercise. Acknowledge that pre and post exercise glucose checks will be done for 3 sessions at entry of program.   Know Your Numbers and Heart Failure: - Group verbal and visual instruction to discuss disease risk factors for cardiac and pulmonary disease and treatment options.  Reviews associated critical values for Overweight/Obesity, Hypertension, Cholesterol, and Diabetes.  Discusses basics of heart failure: signs/symptoms and treatments.  Introduces Heart Failure Zone chart for action plan for heart failure.  Written material given at graduation. Flowsheet Row Pulmonary Rehab from 10/14/2021 in [facility] Cardiac and Pulmonary Rehab  Education need identified 07/08/21  Date 09/30/21  Educator [facility]  Instruction Review Code 1- Verbalizes Understanding       Core Components/Risk Factors/Patient Goals Review:   Goals and Risk Factor Review     Row Name 09/07/21 1124 10/07/21 1123           Core Components/Risk Factors/Patient Goals Review   Personal Goals Review Hypertension;Weight Management/Obesity Hypertension;Weight Management/Obesity      Review Mandy Jones sees the heart failure clinic after class today.  She checks her BP occasionally at home.  It was 128/60 resting today.  She feels she holds a lot of fluid and spends a lof of time in the bathroom from taking fluid pills.  She does weigh daily.  It is going up.  She will follow up at thr HF clinic. Mandy Jones saw her neurologist last week and he told her the balance issues she is having will likely be forever. Mandy Jones will got the heart failure clinic today for her low BP - today 94/62; she has been running low. She checks her BP at home, but not consistently. Mandy Jones has been going well aside from her BP being low. She has stopped her supplements until she goes to the heart failure clinic. She continues to take her weight daily.      Expected Outcomes Short: contineu to monitor weight and BP Long: manage risk factors long  term Short: Monitor weight and BP Long: manage risk factors long term               Core Components/Risk Factors/Patient Goals at Discharge (Final Review):   Goals and Risk Factor Review - 10/07/21 1123       Core Components/Risk Factors/Patient Goals Review   Personal Goals Review Hypertension;Weight Management/Obesity    Review Mandy Jones saw her neurologist last week and he told her the balance issues she is having will likely be forever. Mandy Jones will got the heart failure clinic today for her low BP - today 94/62; she has been running low. She checks her BP at home, but not consistently. Mandy Jones has been going well aside from her BP being low. She has stopped her supplements until she goes to the heart failure clinic. She continues to take her weight daily.    Expected Outcomes Short: Monitor weight and BP Long: manage risk factors long term             ITP Comments:  ITP Comments     Row Name 07/05/21 1434 07/08/21 1121 07/14/21 1534 07/28/21 0724 07/29/21 1444   ITP Comments Initial telephone orientation completed. Diagnosis can be found in [facility] 8/24. EP orientation scheduled for Thursday 11/10 at 9:30. Completed 6MWT and gym orientation. Initial ITP created and sent for review to Dr. Ottie Glazier, Medical Director. First full day of exercise!  Patient was oriented to gym and equipment including functions, settings, policies, and procedures.  Patient's individual exercise prescription and treatment plan were reviewed.  All starting workloads were established based on the results of the 6 minute walk test done at initial orientation visit.  The plan for exercise progression was also introduced and progression will be customized based on patient's performance and goals. 30 Day review completed. Medical  Director ITP review done, changes made as directed, and signed approval by Market researcher.   New to program Completed initial RD consultation    Row Name 08/17/21 1103 08/25/21 0744  09/22/21 0906 10/20/21 0849     ITP Comments Dhana has missed some sessions and is travelling for the holiday.  Staff were not able to speak with her about goals. 30 Day review completed. Medical Director ITP review done, changes made as directed, and signed approval by Medical Director. 30 Day review completed. Medical Director ITP review done, changes made as directed, and signed approval by Medical Director. 30 Day review completed. Medical Director ITP review done, changes made as directed, and signed approval by Medical Director.             Comments: discharge ITP

## 2021-10-26 NOTE — Progress Notes (Signed)
Discharge Progress Report  Patient Details  Name: Mandy Jones MRN: [id number] Date of Birth: 1951/03/10 Referring Provider:   Flowsheet Row Pulmonary Rehab from 07/08/2021 in [facility] Cardiac and Pulmonary Rehab  Referring Provider Serafina Royals MD        Number of Visits: 17  Reason for Discharge:  Patient reached a stable level of exercise. Patient independent in their exercise. Early Exit:  Insurance: Patient's co-pays prevented her from continuing in the program. She plans to continue exercising at the [facility].  Smoking History:  Social History   Tobacco Use  Smoking Status Former  Smokeless Tobacco Never  Tobacco Comments   quit 30 years ago    Diagnosis:  Heart failure, chronic systolic (HCC)  ADL UCSD:  Pulmonary Assessment Scores     Row Name 07/08/21 1125         ADL UCSD   ADL Phase Entry     SOB Score total 63     Rest 4     Walk 5     Stairs 5     Bath 1     Dress 1     Shop 2       CAT Score   CAT Score 17       mMRC Score   mMRC Score 3              Initial Exercise Prescription:  Initial Exercise Prescription - 07/08/21 1300       Date of Initial Exercise RX and Referring Provider   Date 07/08/21    Referring Provider Serafina Royals MD      Oxygen   Maintain Oxygen Saturation 88% or higher      Recumbant Bike   Level 1    RPM 60    Watts 15    Minutes 15    METs 1.1      NuStep   Level 1    SPM 80    Minutes 15    METs 1.1      Track   Laps 16    Minutes 15    METs 1.87      Prescription Details   Frequency (times per week) 2    Duration Progress to 30 minutes of continuous aerobic without signs/symptoms of physical distress      Intensity   THRR 40-80% of Max Heartrate 106-134    Ratings of Perceived Exertion 11-13    Perceived Dyspnea 0-4      Progression   Progression Continue to progress workloads to maintain intensity without signs/symptoms of physical distress.      Resistance Training    Training Prescription Yes    Weight 3 lb    Reps 10-15             Discharge Exercise Prescription (Final Exercise Prescription Changes):  Exercise Prescription Changes - 10/18/21 1300       Response to Exercise   Blood Pressure (Exit) 102/62    Heart Rate (Admit) 97 bpm    Heart Rate (Exercise) 107 bpm    Heart Rate (Exit) 110 bpm    Oxygen Saturation (Admit) 93 %    Oxygen Saturation (Exercise) 93 %    Oxygen Saturation (Exit) 95 %    Rating of Perceived Exertion (Exercise) 14    Perceived Dyspnea (Exercise) 3    Symptoms SOB    Duration Continue with 30 min of aerobic exercise without signs/symptoms of physical distress.    Intensity THRR unchanged  Progression   Progression Continue to progress workloads to maintain intensity without signs/symptoms of physical distress.    Average METs 1.9      Resistance Training   Training Prescription Yes    Weight 2 lb    Reps 10-15      Interval Training   Interval Training No      Biostep-RELP   Level 2    Minutes 15    METs 2      Track   Laps 10    Minutes 15    METs 1.54      Home Exercise Plan   Plans to continue exercise at Home (comment)   walking   Frequency Add 1 additional day to program exercise sessions.    Initial Home Exercises Provided 09/07/21      Oxygen   Maintain Oxygen Saturation 88% or higher             Functional Capacity:  6 Minute Walk     Row Name 07/08/21 1130         6 Minute Walk   Phase Initial     Distance 875 feet     Walk Time 6 minutes     # of Rest Breaks 0     MPH 1.65     METS 1.16     RPE 12     Perceived Dyspnea  2     VO2 Peak 4.07     Symptoms Yes (comment)     Comments Felt unsteady on feet     Resting HR 79 bpm     Resting BP 104/64     Resting Oxygen Saturation  97 %     Exercise Oxygen Saturation  during 6 min walk 96 %     Max Ex. HR 105 bpm     Max Ex. BP 112/66     2 Minute Post BP 102/66       Interval HR   1 Minute HR 99     2  Minute HR 102     3 Minute HR 105     4 Minute HR 102     5 Minute HR 103     6 Minute HR 101     2 Minute Post HR 87     Interval Heart Rate? Yes       Interval Oxygen   Interval Oxygen? Yes     Baseline Oxygen Saturation % 97 %     1 Minute Oxygen Saturation % 96 %     1 Minute Liters of Oxygen 0 L  RA     2 Minute Oxygen Saturation % 97 %     2 Minute Liters of Oxygen 0 L     3 Minute Oxygen Saturation % 96 %     3 Minute Liters of Oxygen 0 L     4 Minute Oxygen Saturation % 96 %     4 Minute Liters of Oxygen 0 L     5 Minute Oxygen Saturation % 97 %     5 Minute Liters of Oxygen 0 L     6 Minute Oxygen Saturation % 96 %     6 Minute Liters of Oxygen 0 L     2 Minute Post Oxygen Saturation % 98 %     2 Minute Post Liters of Oxygen 0 L              Psychological, QOL, Others - Outcomes:  PHQ 2/9: Depression screen [facility] 2/9 09/07/2021 08/31/2021 07/28/2021 07/08/2021 03/03/2021  Decreased Interest 1 1 0 1 0  Down, Depressed, Hopeless 3 2 0 2 1  PHQ - 2 Score 4 3 0 3 1  Altered sleeping 0 1 - 3 -  Tired, decreased energy 2 3 - 3 -  Change in appetite 0 2 - 2 -  Feeling bad or failure about yourself  2 2 - 1 -  Trouble concentrating 1 2 - 1 -  Moving slowly or fidgety/restless 0 2 - 1 -  Suicidal thoughts 0 0 - 0 -  PHQ-9 Score 9 15 - 14 -  Difficult doing work/chores Somewhat difficult Very difficult - Somewhat difficult -    Quality of Life:   P Nutrition & Weight - Outcomes:  Pre Biometrics - 07/08/21 1122       Pre Biometrics   Height 5' 2.25" (1.581 m)    Weight 227 lb 14.4 oz (103.4 kg)    BMI (Calculated) 41.36    Single Leg Stand 8.94 seconds              Nutrition:  Nutrition Therapy & Goals - 07/29/21 1333       Nutrition Therapy   Diet Heart healthy, low Na    Protein (specify units) 85g    Fiber 25 grams    Whole Grain Foods 3 servings    Saturated Fats 12 max. grams    Fruits and Vegetables 8 servings/day    Sodium 1.5 grams       Personal Nutrition Goals   Nutrition Goal ST: calculate sodium intake for 3 random days, use <1/4 tsp salt when cooking LT: limit Na < 1.5g/day    Comments B: something small - whole grain cereal or eggs/peppers/potatoes L: "hit or miss" every other day. leftovers mostly or [employer] if she is on the road (grilled chicken sandwich) - 2x/week D: mostly vegetables (seasonally, colorful, salads, sometimes sweet potatoes, all squashes) with small amount of protein - size of her palm (chicken, pork, fish - sea bass, salmon, tuna, shrimp) as well as ancient grains. She makes a lot of stirfries she uses glass noodles. And will sometimes have spaghetti. She uses olive oil and some butter. She salts her food sometimes while cooking (max 1/2 tsp) and salt-free seasonings. Drinks: 2-3 cups of coffee (Oatmilk cream and sweetener), water and seltzer, tea (black). She tries to not keep sugar in the house becuase she feels like it is a problem for her. Reviewed heart healthy eating and      Intervention Plan   Intervention Prescribe, educate and counsel regarding individualized specific dietary modifications aiming towards targeted core components such as weight, hypertension, lipid management, diabetes, heart failure and other comorbidities.;Nutrition handout(s) given to patient.    Expected Outcomes Short Term Goal: Understand basic principles of dietary content, such as calories, fat, sodium, cholesterol and nutrients.;Short Term Goal: A plan has been developed with personal nutrition goals set during dietitian appointment.;Long Term Goal: Adherence to prescribed nutrition plan.             Nutrition Discharge:   Education Questionnaire Score:  Knowledge Questionnaire Score - 07/08/21 1120       Knowledge Questionnaire Score   Pre Score 23/26 (cardiac): Diabetes, Nutrition, Aerobic             Goals reviewed with patient; copy given to patient.

## 2021-11-03 NOTE — Progress Notes (Deleted)
Patient ID: Cindra Austad, female    DOB: September 15, 1950, 71 y.o.   MRN: [id number]  HPI  3Ms Leaman is a 71 y/o female with a history of atrial fibrillation, HTN, thyroid disease, GERD, sleep apnea, depression, fibromyalgia, hyperlipidemia, previous tobacco use and chronic heart failure.   Echo report from 10/15/20 reviewed and showed an EF of 40%. Echo report from 02/10/20 reviewed and showed an EF of 55-60% along with mild LAE.   Has not been admitted or been in the ED in the last 6 months  She presents today for a follow-up visit with a chief complaint of minimal fatigue with moderate exertion. She describes this as chronic in nature having been present for several years. She has associated shortness of breath, abdominal distention, difficulty sleeping and chronic pain along with this. She denies any dizziness, palpitations, pedal edema, chest pain, cough or weight gain.   Tolerating jardiance without known side effects.   Past Medical History:  Diagnosis Date   Arrhythmia    atrial fibrillation   Arthritis    CHF (congestive heart failure) (HCC)    Depression    Dyspnea    Dysrhythmia    Fibromyalgia 1992   GERD (gastroesophageal reflux disease)    Hepatitis    Hepatitis B 1985   Hyperlipidemia    Hypertension    Mitral valve prolapse    Pacemaker    Pacemaker 01/13/2021   Sleep apnea    CPAP   Tendon tear, ankle    rt foot   Thyroid dysfunction    Past Surgical History:  Procedure Laterality Date   ACHILLES TENDON SURGERY Right 04/06/2018   Procedure: ACHILLES TENDON REPAIR;  Surgeon: [name], DPM;  Location: [facility];  Service: Podiatry;  Laterality: Right;   CARDIOVERSION N/A 02/06/2018   Procedure: CARDIOVERSION;  Surgeon: Corey Skains, MD;  Location: [facility];  Service: Cardiovascular;  Laterality: N/A;   CARDIOVERSION N/A 03/28/2018   Procedure: CARDIOVERSION;  Surgeon: Corey Skains, MD;  Location: [facility];  Service: Cardiovascular;   Laterality: N/A;   CARDIOVERSION N/A 05/09/2018   Procedure: CARDIOVERSION;  Surgeon: Corey Skains, MD;  Location: [facility];  Service: Cardiovascular;  Laterality: N/A;   CARDIOVERSION N/A 10/23/2019   Procedure: CARDIOVERSION;  Surgeon: Corey Skains, MD;  Location: [facility];  Service: Cardiovascular;  Laterality: N/A;   CARDIOVERSION N/A 09/30/2020   Procedure: CARDIOVERSION;  Surgeon: Corey Skains, MD;  Location: [facility];  Service: Cardiovascular;  Laterality: N/A;   CARDIOVERSION N/A 11/19/2020   Procedure: CARDIOVERSION;  Surgeon: Corey Skains, MD;  Location: [facility];  Service: Cardiovascular;  Laterality: N/A;   CARDIOVERSION N/A 03/16/2021   Procedure: CARDIOVERSION;  Surgeon: Corey Skains, MD;  Location: [facility];  Service: Cardiovascular;  Laterality: N/A;   CHOLECYSTECTOMY     CONTINUOUS NERVE MONITORING N/A 08/03/2017   Procedure: LARYNGEAL NERVE MONITORING;  Surgeon: Carloyn Manner, MD;  Location: [facility];  Service: ENT;  Laterality: N/A;   EXTRACORPOREAL SHOCK WAVE LITHOTRIPSY Right 02/13/2020   Procedure: EXTRACORPOREAL SHOCK WAVE LITHOTRIPSY (ESWL);  Surgeon: Billey Co, MD;  Location: [facility];  Service: Urology;  Laterality: Right;   EYE SURGERY Bilateral    FOOT SURGERY Left 2000   JOINT REPLACEMENT     left knee   OSTECTOMY Right 04/06/2018   Procedure: OSTECTOMY-HAGLUNDS/RECTROCALCANEAL;  Surgeon: [name], DPM;  Location: [facility];  Service: Podiatry;  Laterality: Right;   SHOULDER ARTHROSCOPY Bilateral    SHOULDER  ARTHROSCOPY Left    THYROIDECTOMY N/A 08/03/2017   Procedure: THYROIDECTOMY;  Surgeon: Carloyn Manner, MD;  Location: [facility];  Service: ENT;  Laterality: N/A;   TONSILLECTOMY     TUBAL LIGATION     Family History  Problem Relation Age of Onset   Hypertension Mother    Gastric cancer Father    Social History   Tobacco Use   Smoking status: Former   Smokeless tobacco: Never   Tobacco comments:    quit 30 years  ago  Substance Use Topics   Alcohol use: Yes    Comment: occassional   Allergies  Allergen Reactions   Silver Other (See Comments)     Causes blisters.   Tape Rash    Pads used for ablation left a rash for a couple of days.   Prior to Admission medications   Medication Sig Start Date End Date Taking? Authorizing Provider  apixaban (ELIQUIS) 5 MG TABS tablet Take 5 mg by mouth every 12 (twelve) hours. 02/14/20  Yes Val Riles, MD  Ascorbic Acid (VITAMIN C) 1000 MG tablet Take 1,000 mg by mouth every evening.   Yes [provider]  B Complex-C (SUPER B COMPLEX PO) Take 1 tablet by mouth daily.   Yes [provider]  Boswellia-Glucosamine-Vit D (OSTEO BI-FLEX ONE PER DAY) TABS Take 1 tablet by mouth daily.   Yes [provider]  Calcium-Magnesium-Vitamin D (CALCIUM MAGNESIUM PO) Take 1 tablet by mouth daily.   Yes [provider]  Cholecalciferol (VITAMIN D3) 50 MCG (2000 UT) TABS Take 2,000 Units by mouth daily.   Yes [provider]  Coenzyme Q10 (CO Q 10) 100 MG CAPS Take 100 mg by mouth daily.   Yes [provider]  empagliflozin (JARDIANCE) 10 MG TABS tablet Take 1 tablet (10 mg total) by mouth daily before breakfast. 09/07/21  Yes Darylene Price A, FNP  famotidine (PEPCID) 20 MG tablet Take 40 mg by mouth at bedtime. 10/09/19  Yes [provider]  levothyroxine (SYNTHROID) 150 MCG tablet Take 150 mcg by mouth daily before breakfast. 10/15/19  Yes [provider]  melatonin 5 MG TABS Take 5 mg by mouth at bedtime as needed (sleep).   Yes [provider]  Multiple Vitamins-Minerals (PRESERVISION AREDS 2 PO) Take 1 tablet by mouth daily.   Yes [provider]  potassium chloride (KLOR-CON) 10 MEQ tablet Take 20 mEq by mouth daily. 09/12/20  Yes [provider]  Red Yeast Rice Extract 600 MG CAPS Take 600 mg by mouth every evening.   Yes [provider]  sacubitril-valsartan (ENTRESTO)  24-26 MG Take 1 tablet by mouth 2 (two) times daily. 03/03/21  Yes Hackney, Otila Kluver A, FNP  spironolactone (ALDACTONE) 25 MG tablet TAKE 1 TABLET (25 MG TOTAL) BY MOUTH DAILY.  08/26/21 11/24/21 Yes Hackney, Otila Kluver A, FNP  torsemide (DEMADEX) 100 MG tablet Take 0.5 tablets (50 mg total) by mouth daily. 07/28/21 10/26/21 Yes Hackney, Aura Fey, FNP  traZODone (DESYREL) 50 MG tablet Take 50-150 mg by mouth at bedtime as needed for sleep. 12/04/17  Yes [provider]  valACYclovir (VALTREX) 1000 MG tablet Take 2,000 mg by mouth daily as needed (For breakout). 11/23/20  Yes [provider]  XIIDRA 5 % SOLN Apply 1 drop to eye 2 (two) times daily. 05/25/21  Yes [provider]  Ashwagandha 500 MG CAPS Take 1,000 mg by mouth daily. Patient not taking: Reported on 09/07/2021    [provider]  Review of Systems  Constitutional:  Positive for fatigue. Negative for appetite change.  HENT:  Negative for congestion, postnasal drip and sore throat.   Eyes: Negative.   Respiratory:  Positive for shortness of breath (minimal). Negative for cough and chest tightness.   Cardiovascular:  Negative for chest pain, palpitations and leg swelling.  Gastrointestinal:  Positive for abdominal distention. Negative for abdominal pain.  Endocrine: Negative.   Musculoskeletal:  Positive for arthralgias (left shoulder) and myalgias (right thigh). Negative for back pain.  Skin:  Negative for rash.       Itching lower abdomen  Allergic/Immunologic: Negative.   Neurological:  Negative for dizziness and light-headedness.  Hematological:  Negative for adenopathy. Does not bruise/bleed easily.  Psychiatric/Behavioral:  Positive for sleep disturbance (interrupted sleep; sleeping on 1-2 pillows; wearing CPAP most nights). Negative for dysphoric mood. The patient is not nervous/anxious.    There were no vitals filed for this visit.  Wt Readings from Last 3 Encounters:  10/07/21 230 lb 3 oz (104.4 kg)   09/07/21 233 lb 2 oz (105.7 kg)  07/28/21 235 lb 3 oz (106.7 kg)   Lab Results  Component Value Date   CREATININE 0.75 10/07/2021   CREATININE 0.62 09/07/2021   CREATININE 0.74 04/21/2021   Physical Exam Vitals and nursing note reviewed.  Constitutional:      Appearance: Normal appearance.  HENT:     Head: Normocephalic and atraumatic.  Cardiovascular:     Rate and Rhythm: Tachycardia present. Rhythm irregular.  Pulmonary:     Effort: Pulmonary effort is normal. No respiratory distress.     Breath sounds: No wheezing or rales.  Abdominal:     General: There is no distension.     Palpations: Abdomen is soft.  Musculoskeletal:        General: No tenderness.     Cervical back: Normal range of motion and neck supple.     Right lower leg: No edema.     Left lower leg: No edema.  Skin:    General: Skin is warm and dry.  Neurological:     General: No focal deficit present.     Mental Status: She is alert and oriented to person, place, and time.  Psychiatric:        Mood and Affect: Mood normal.        Behavior: Behavior normal.        Thought Content: Thought content normal.   Assessment & Plan:  1: Chronic heart failure with reduced ejection fraction- - NYHA class II - euvolemic today - weighing daily; reminded to call for an overnight weight gain of >2 pounds or a weekly weight gain of >5 pounds - weight down 3 pounds from last visit here 1 month ago - not adding salt except to pasta water - saw cardiology Nehemiah Massed) 05/26/21 - on GDMT of entresto, jardiance & spironolactone - will check BMP today as jardiance started at last visit - will decrease spironolactone to 12.'5mg'$  daily due to low home BP readings - jardiance patient assistance forms filled out today - consider adding beta-blocker especially if HR remains elevated - participating in pulmonary rehab - saw pulmonology Raul Del) 07/15/21 - BNP 02/22/21 was 108.8  2: HTN- - BP looks good (109/73); home BP log  reviewed and shows low readings; decreasing spironolactone per above - saw PCP Quentin Cornwall) 03/29/21 - BMP 09/07/21 reviewed and showed sodium 137, potassium 4.3, creatinine 0.62 and GFR >60  3: Atrial fibrillation- - previous ablation & cardioversion  done  - on apixaban - saw EP Marcello Moores) 04/09/21   Patient did not bring her medications nor a list. Each medication was verbally reviewed with the patient and she was encouraged to bring the bottles to every visit to confirm accuracy of list.  Return in 1 month, sooner if needed.

## 2021-11-04 ENCOUNTER — Ambulatory Visit: Payer: HMO | Admitting: Family

## 2021-11-18 ENCOUNTER — Ambulatory Visit: Payer: HMO | Admitting: Family

## 2021-11-24 DIAGNOSIS — M25519 Pain in unspecified shoulder: Secondary | ICD-10-CM | POA: Insufficient documentation

## 2021-11-24 DIAGNOSIS — H3530 Unspecified macular degeneration: Secondary | ICD-10-CM | POA: Insufficient documentation

## 2021-11-24 DIAGNOSIS — M75120 Complete rotator cuff tear or rupture of unspecified shoulder, not specified as traumatic: Secondary | ICD-10-CM | POA: Insufficient documentation

## 2021-11-26 ENCOUNTER — Ambulatory Visit: Payer: HMO | Admitting: Family

## 2021-11-29 ENCOUNTER — Ambulatory Visit (HOSPITAL_COMMUNITY)
Admission: RE | Admit: 2021-11-29 | Discharge: 2021-11-29 | Disposition: A | Discharge status: Home / Self Care | Payer: HMO | Source: Ambulatory Visit | Attending: Neurology | Admitting: Neurology

## 2021-11-29 DIAGNOSIS — R42 Dizziness and giddiness: Secondary | ICD-10-CM | POA: Diagnosis present

## 2021-11-29 DIAGNOSIS — R2689 Other abnormalities of gait and mobility: Secondary | ICD-10-CM | POA: Insufficient documentation

## 2021-11-29 NOTE — Progress Notes (Signed)
Informed of MRI for today.  ? ?Device system confirmed to be MRI conditional, with implant date > 6 weeks ago, and no evidence of abandoned or epicardial leads in review of most recent CXR ?Interrogation from today reviewed, pt is currently  AFib-Vsensed  at ~100 bpm ?Change device settings for MRI to ODO  ? ?Tachy-therapies to off if applicable. ? ?Program device back to pre-MRI settings after completion of exam. ? ?Shirley Friar, PA-C  ?11/29/2021 12:37 PM   ?

## 2021-11-29 NOTE — Progress Notes (Signed)
Per order, "Change device settings for MRI to ODO  ? ?Tachy-therapies to off if applicable.  ? ?Program device back to pre-MRI settings after completion of exam" ?

## 2021-11-30 ENCOUNTER — Encounter: Payer: Self-pay | Admitting: Family

## 2021-11-30 ENCOUNTER — Ambulatory Visit: Payer: HMO | Attending: Family | Admitting: Family

## 2021-11-30 VITALS — BP 94/61 | HR 81 | Resp 18 | Ht 63.0 in | Wt 230.0 lb

## 2021-11-30 DIAGNOSIS — G4730 Sleep apnea, unspecified: Secondary | ICD-10-CM | POA: Insufficient documentation

## 2021-11-30 DIAGNOSIS — I4891 Unspecified atrial fibrillation: Secondary | ICD-10-CM | POA: Insufficient documentation

## 2021-11-30 DIAGNOSIS — F329 Major depressive disorder, single episode, unspecified: Secondary | ICD-10-CM

## 2021-11-30 DIAGNOSIS — Z79899 Other long term (current) drug therapy: Secondary | ICD-10-CM | POA: Diagnosis not present

## 2021-11-30 DIAGNOSIS — I480 Paroxysmal atrial fibrillation: Secondary | ICD-10-CM

## 2021-11-30 DIAGNOSIS — I5022 Chronic systolic (congestive) heart failure: Secondary | ICD-10-CM | POA: Insufficient documentation

## 2021-11-30 DIAGNOSIS — I10 Essential (primary) hypertension: Secondary | ICD-10-CM | POA: Diagnosis not present

## 2021-11-30 DIAGNOSIS — E785 Hyperlipidemia, unspecified: Secondary | ICD-10-CM | POA: Diagnosis not present

## 2021-11-30 DIAGNOSIS — F32A Depression, unspecified: Secondary | ICD-10-CM | POA: Insufficient documentation

## 2021-11-30 DIAGNOSIS — K219 Gastro-esophageal reflux disease without esophagitis: Secondary | ICD-10-CM | POA: Insufficient documentation

## 2021-11-30 DIAGNOSIS — I110 Hypertensive heart disease with heart failure: Secondary | ICD-10-CM | POA: Diagnosis not present

## 2021-11-30 DIAGNOSIS — M797 Fibromyalgia: Secondary | ICD-10-CM | POA: Diagnosis not present

## 2021-11-30 DIAGNOSIS — E079 Disorder of thyroid, unspecified: Secondary | ICD-10-CM | POA: Diagnosis not present

## 2021-11-30 DIAGNOSIS — Z7901 Long term (current) use of anticoagulants: Secondary | ICD-10-CM | POA: Insufficient documentation

## 2021-11-30 MED ORDER — SPIRONOLACTONE 25 MG PO TABS: 25.0000 mg | ORAL_TABLET | ORAL | 3 refills | Status: AC

## 2021-11-30 NOTE — Patient Instructions (Addendum)
Continue weighing daily and call for an overnight weight gain of 3 pounds or more or a weekly weight gain of more than 5 pounds. ? ?If you have voicemail, please make sure your mailbox is cleaned out so that we may leave a message and please make sure to listen to any voicemails.  ? ?Change your spironolactone to 1 tablet every other day.  ? ?Check your blood pressure once a day.  ?

## 2021-11-30 NOTE — Progress Notes (Signed)
? Patient ID: Mandy Jones, female    DOB: 07-Nov-1950, 71 y.o.   MRN: [id number] ? ?HPI ? ?Mandy Jones is a 71 y/o female with a history of atrial fibrillation, HTN, thyroid disease, GERD, sleep apnea, depression, fibromyalgia, hyperlipidemia, previous tobacco use and chronic heart failure.  ? ?Echo report from 10/15/20 reviewed and showed an EF of 40%. Echo report from 02/10/20 reviewed and showed an EF of 55-60% along with mild LAE.  ? ?Has not been admitted or been in the ED in the last 6 months ? ?She presents today for a follow-up visit with a chief complaint of minimal shortness of breath with moderate exertion. She describes this as chronic in nature. She has associated fatigue, abdominal distention, difficulty sleeping, worsening depression and chronic pain along with this. She denies any dizziness, palpitations, pedal edema, chest pain, cough or weight gain.  ? ?Has been started on low dose metoprolol since last here and she doesn't feel like her fatigue is any worse. BP low today and she says that it's been running low over the last couple of months. She says that she has difficulty in cutting the spironolactone in half due to the size of the tablet.  ? ?Says that her depression is worsening but she finally has an appointment later this month with a therapist as well as a psychiatrist. She says that she doesn't want to do any activities because she feels depressed. Adamantly denies any suicidal thoughts.  ? ?Past Medical History:  ?Diagnosis Date  ? Arrhythmia   ? atrial fibrillation  ? Arthritis   ? CHF (congestive heart failure) ([place])   ? Depression   ? Dyspnea   ? Dysrhythmia   ? Fibromyalgia 1992  ? GERD (gastroesophageal reflux disease)   ? Hepatitis   ? Hepatitis B 1985  ? Hyperlipidemia   ? Hypertension   ? Mitral valve prolapse   ? Pacemaker   ? Pacemaker 01/13/2021  ? Sleep apnea   ? CPAP  ? Tendon tear, ankle   ? rt foot  ? Thyroid dysfunction   ? ?Past Surgical History:  ?Procedure  Laterality Date  ? ACHILLES TENDON SURGERY Right 04/06/2018  ? Procedure: ACHILLES TENDON REPAIR;  Surgeon: [name], DPM;  Location: [facility];  Service: Podiatry;  Laterality: Right;  ? CARDIOVERSION N/A 02/06/2018  ? Procedure: CARDIOVERSION;  Surgeon: Corey Skains, MD;  Location: [facility];  Service: Cardiovascular;  Laterality: N/A;  ? CARDIOVERSION N/A 03/28/2018  ? Procedure: CARDIOVERSION;  Surgeon: Corey Skains, MD;  Location: [facility];  Service: Cardiovascular;  Laterality: N/A;  ? CARDIOVERSION N/A 05/09/2018  ? Procedure: CARDIOVERSION;  Surgeon: Corey Skains, MD;  Location: [facility];  Service: Cardiovascular;  Laterality: N/A;  ? CARDIOVERSION N/A 10/23/2019  ? Procedure: CARDIOVERSION;  Surgeon: Corey Skains, MD;  Location: [facility];  Service: Cardiovascular;  Laterality: N/A;  ? CARDIOVERSION N/A 09/30/2020  ? Procedure: CARDIOVERSION;  Surgeon: Corey Skains, MD;  Location: [facility];  Service: Cardiovascular;  Laterality: N/A;  ? CARDIOVERSION N/A 11/19/2020  ? Procedure: CARDIOVERSION;  Surgeon: Corey Skains, MD;  Location: [facility];  Service: Cardiovascular;  Laterality: N/A;  ? CARDIOVERSION N/A 03/16/2021  ? Procedure: CARDIOVERSION;  Surgeon: Corey Skains, MD;  Location: [facility];  Service: Cardiovascular;  Laterality: N/A;  ? CHOLECYSTECTOMY    ? CONTINUOUS NERVE MONITORING N/A 08/03/2017  ? Procedure: LARYNGEAL NERVE MONITORING;  Surgeon: Carloyn Manner, MD;  Location: [facility];  Service: ENT;  Laterality: N/A;  ? EXTRACORPOREAL SHOCK WAVE LITHOTRIPSY Right 02/13/2020  ? Procedure: EXTRACORPOREAL SHOCK WAVE LITHOTRIPSY (ESWL);  Surgeon: Billey Co, MD;  Location: [facility];  Service: Urology;  Laterality: Right;  ? EYE SURGERY Bilateral   ? FOOT SURGERY Left 2000  ? JOINT REPLACEMENT    ? left knee  ? OSTECTOMY Right 04/06/2018  ? Procedure: OSTECTOMY-HAGLUNDS/RECTROCALCANEAL;  Surgeon: [name], DPM;  Location: [facility];  Service: Podiatry;  Laterality:  Right;  ? SHOULDER ARTHROSCOPY Bilateral   ? SHOULDER ARTHROSCOPY Left   ? THYROIDECTOMY N/A 08/03/2017  ? Procedure: THYROIDECTOMY;  Surgeon: Carloyn Manner, MD;  Location: [facility];  Service: ENT;  Laterality: N/A;  ? TONSILLECTOMY    ? TUBAL LIGATION    ? ?Family History  ?Problem Relation Age of Onset  ? Hypertension Mother   ? Gastric cancer Father   ? ?Social History  ? ?Tobacco Use  ? Smoking status: Former  ? Smokeless tobacco: Never  ? Tobacco comments:  ?  quit 30 years ago  ?Substance Use Topics  ? Alcohol use: Yes  ?  Comment: occassional  ? ?Allergies  ?Allergen Reactions  ? Silver Other (See Comments)  ?   ?Causes blisters.  ? Tape Rash  ?  Pads used for ablation left a rash for a couple of days.  ? ?Prior to Admission medications   ?Medication Sig Start Date End Date Taking? Authorizing Provider  ?apixaban (ELIQUIS) 5 MG TABS tablet Take 5 mg by mouth every 12 (twelve) hours. 02/14/20  Yes Val Riles, MD  ?Ascorbic Acid (VITAMIN C) 1000 MG tablet Take 1,000 mg by mouth every evening.   Yes [provider]  ?Boswellia-Glucosamine-Vit D (OSTEO BI-FLEX ONE PER DAY) TABS Take 1 tablet by mouth daily.   Yes [provider]  ?Calcium-Magnesium-Vitamin D (CALCIUM MAGNESIUM PO) Take 1 tablet by mouth daily.   Yes [provider]  ?Cholecalciferol (VITAMIN D3) 50 MCG (2000 UT) TABS Take 2,000 Units by mouth daily.   Yes [provider]  ?Coenzyme Q10 (CO Q 10) 100 MG CAPS Take 100 mg by mouth daily.   Yes [provider]  ?empagliflozin (JARDIANCE) 10 MG TABS tablet Take 1 tablet by mouth daily before breakfast. 09/12/21  Yes [provider]  ?famotidine (PEPCID) 20 MG tablet Take 40 mg by mouth at bedtime. 10/09/19  Yes [provider]  ?levothyroxine (SYNTHROID) 150 MCG tablet Take 150 mcg by mouth daily before breakfast. 10/15/19  Yes [provider]  ?melatonin 5 MG TABS Take 5 mg by mouth at bedtime as needed (sleep).   Yes [provider]  ?metoprolol succinate (TOPROL-XL) 25 MG 24 hr tablet Take 12.5 mg by mouth daily. 11/24/21 11/24/22 Yes [provider]  ?Multiple Vitamins-Minerals (PRESERVISION AREDS 2 PO) Take 1 tablet by mouth daily.   Yes [provider]  ?potassium chloride (KLOR-CON) 10 MEQ tablet Take 20 mEq by mouth daily. 09/12/20  Yes [provider]  ?Red Yeast Rice Extract 600 MG CAPS Take 600 mg by mouth every evening.   Yes [provider]  ?sacubitril-valsartan (ENTRESTO) 24-26 MG Take 1 tablet by mouth 2 (two) times daily. 03/03/21  Yes Darylene Price A, FNP  ?torsemide (DEMADEX) 100 MG tablet Take 0.5 tablets (50 mg total) by mouth daily. 07/28/21  Yes Darylene Price A, FNP  ?traZODone (DESYREL) 50 MG tablet Take 50-150 mg by mouth at bedtime as needed for sleep. 12/04/17  Yes [provider]  ?Shirley Friar  5 % SOLN Apply 1 drop to eye 2 (two) times daily. 05/25/21  Yes [provider]  ?Ashwagandha 500 MG CAPS Take 1,000 mg by mouth daily. ?Patient not taking: Reported on 09/07/2021    [provider]  ?B Complex-C (SUPER B COMPLEX PO) Take 1 tablet by mouth daily. ?Patient not taking: Reported on 11/30/2021    [provider]  ?spironolactone (ALDACTONE) 25 MG tablet Take 1/2 tablet (12.5 mg total) by mouth every day. 11/30/21 02/28/22  Alisa Graff, FNP  ? ?Review of Systems  ?Constitutional:  Positive for fatigue. Negative for appetite change.  ?HENT:  Negative for congestion, postnasal drip and sore throat.   ?Eyes: Negative.   ?Respiratory:  Positive for shortness of breath (minimal). Negative for cough and chest tightness.   ?Cardiovascular:  Negative for chest pain, palpitations and leg swelling.  ?Gastrointestinal:  Positive for abdominal distention. Negative for abdominal pain.  ?Endocrine: Negative.   ?Genitourinary: Negative.   ?Musculoskeletal:  Positive for arthralgias (left shoulder). Negative for back pain and myalgias.  ?Skin:  Negative for rash.   ?     Itching lower abdomen  ?Allergic/Immunologic: Negative.   ?Neurological:  Negative for dizziness and light-headedness.  ?Hematological:  Negative for adenopathy. Does not bruise/bleed easily.  ?Psych

## 2021-12-14 ENCOUNTER — Ambulatory Visit (INDEPENDENT_AMBULATORY_CARE_PROVIDER_SITE_OTHER): Payer: PPO | Admitting: Licensed Clinical Social Worker

## 2021-12-14 ENCOUNTER — Ambulatory Visit: Payer: Self-pay | Admitting: Licensed Clinical Social Worker

## 2021-12-14 DIAGNOSIS — F330 Major depressive disorder, recurrent, mild: Secondary | ICD-10-CM

## 2021-12-14 NOTE — Progress Notes (Signed)
Virtual Visit via Video Note ? ?I connected with Mandy Jones on 12/15/21 at  2:00 PM EDT by a video enabled telemedicine application and verified that I am speaking with the correct person using two identifiers. ? ?Location: ?Patient: home ?Provider: remote office [place], [place]) ?  ?I discussed the limitations of evaluation and management by telemedicine and the availability of in person appointments. The patient expressed understanding and agreed to proceed. ?  ?I discussed the assessment and treatment plan with the patient. The patient was provided an opportunity to ask questions and all were answered. The patient agreed with the plan and demonstrated an understanding of the instructions. ?  ?The patient was advised to call back or seek an in-person evaluation if the symptoms worsen or if the condition fails to improve as anticipated. ? ?I provided 55 minutes of non-face-to-face time during this encounter. ? ? ?Mandy Jones R Mandy Epple, LCSW ?Comprehensive Clinical Assessment (CCA) Note ? ?12/15/2021 ?Mandy Jones [facility] ?[id number] ? ?Chief Complaint:  ?Chief Complaint  ?Patient presents with  ? Establish Care  ? ?Visit Diagnosis:  ?  ?Encounter Diagnosis  ?Name Primary?  ? MDD (major depressive disorder), recurrent episode, mild ([place]) Yes  ? ? ? ?CCA Screening, Triage and Referral (STR) ? ?Patient Reported Information ?How did you hear about [place]? No data recorded ?Referral name: No data recorded ?Referral phone number: No data recorded ? ?Whom do you see for routine medical problems? No data recorded ?Practice/Facility Name: No data recorded ?Practice/Facility Phone Number: No data recorded ?Name of Contact: No data recorded ?Contact Number: No data recorded ?Contact Fax Number: No data recorded ?Prescriber Name: No data recorded ?Prescriber Address (if known): No data recorded ? ?What Is the Reason for Your Visit/Call Today? Mandy Jones is a 71 yo female reporting virtually to [facility] for establishment of  outpatient psychotherapy services. Pt reports that she currently lives alone in [place]. Pt is not currently under treatment of a psychiatrist currently--pt has been in counseling for years but counselor recently retired. Pt reports that she has limited support system--pts parents are deceased and pt is estranged from her brother.Patient reports that she is currently taking cymbalta in Trazodone to manage her symptoms. Patient denies any suicidal ideation, homicidal ideation, or perceptual disturbances. Patient reports that her mother passed away in December 02, 2018, and she has been distressed since then, and does not feel that she has dealt with her grief. 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 Patient reports that she has settled down in [place] and has lived there for 8 years. Patient reports that she has some significant medical concerns including congestive heart failure, double knee replacements, fibromyalgia, macular degeneration, in chronic pain in her legs and shoulders. Patient reports that she has gained a significant amount of weight recently because of water retention due to the congestive heart failure. Patient reports that she drinks alcohol socially, and smokes marijuana occasionally. ? ?How Long Has This Been Causing You Problems? > than 6 months ? ?What Do You Feel Would Help You the Most Today? Treatment for Depression or other mood problem ? ? ?Have You Recently Been in Any Inpatient Treatment (Hospital/Detox/Crisis Center/28-Day Program)? No ? ?Name/Location of Program/Hospital:No data recorded ?How Long Were You There? No data recorded ?When Were You Discharged? No data recorded ? ?Have You Ever Received Services From [facility] Before? Yes ? ?Who Do You See at [facility]? No  data recorded ? ?Have You Recently Had Any Thoughts About Hurting Yourself? No ? ?Are You Planning to Commit Suicide/Harm Yourself  At This time? No ? ? ?Have you Recently Had Thoughts About [place]? No ? ?Explanation: No data recorded ? ?Have You Used Any Alcohol or Drugs in the Past 24 Hours? No ? ?How Long Ago Did You Use Drugs or Alcohol? No data recorded ?What Did You Use and How Much? No data recorded ? ?Do You Currently Have a Therapist/Psychiatrist? No ? ?Name of Therapist/Psychiatrist: No data recorded ? ?Have You Been Recently Discharged From Any Office Practice or Programs? No ? ?Explanation of Discharge From Practice/Program: No data recorded ? ?  ?CCA Screening Triage Referral Assessment ?Type of Contact: Tele-Assessment ? ?Is this Initial or Reassessment? Initial Assessment ? ?Date Telepsych consult ordered in [facility]:  No data recorded ?Time Telepsych consult ordered in [facility]:  No data recorded ? ?Patient Reported Information Reviewed? No data recorded ?Patient Left Without Being Seen? No data recorded ?Reason for Not Completing Assessment: No data recorded ? ?Collateral Involvement: none ? ? ?Does Patient Have a Stage manager Guardian? No data recorded ?Name and Contact of Legal Guardian: No data recorded ?If Minor and Not Living with Parent(s), Who has Custody? n/a ? ?Is CPS involved or ever been involved? Never ? ?Is APS involved or ever been involved? Never ? ? ?Patient Determined To Be At Risk for Harm To Self or Others Based on Review of Patient Reported Information or Presenting Complaint? No ? ?Method: No data recorded ?Availability of Means: No data recorded ?Intent: No data recorded ?Notification Required: No data recorded ?Additional Information for Danger to Others Potential: No data recorded ?Additional Comments for Danger to Others Potential: No data recorded ?Are There Guns or Other Weapons in [place]? No data recorded ?Types of Guns/Weapons: No data recorded ?Are These Weapons Safely Secured?                            No data recorded ?Who Could Verify You Are Able To Have These Secured: No data  recorded ?Do You Have any Outstanding Charges, Pending Court Dates, Parole/Probation? No data recorded ?Contacted To Inform of Risk of Harm To Self or Others: No data recorded ? ?Location of Assessment: No data recorded ? ?Does Patient Present under Involuntary Commitment? No ? ?IVC Papers Initial File Date: No data recorded ? ?[place] [facility] [place] ? ? ?Patient Currently Receiving the Following Services: Individual Therapy ? ? ?Determination of Need: Routine (7 days) ? ? ?Options For Referral: Outpatient Therapy ? ? ? ? ?CCA Biopsychosocial ?Intake/Chief Complaint:  Madelline is a 71 yo female reporting virtually to [facility] for establishment of outpatient psychotherapy services. Pt reports that she currently lives alone in [place]. Pt is not currently under treatment of a psychiatrist currently--pt has been in counseling for years but counselor recently retired. Pt reports that she has limited support system--pts parents are deceased and pt is estranged from her brother.Patient reports that she is currently taking cymbalta in Trazodone to manage her symptoms. Patient denies any suicidal ideation, homicidal ideation, or perceptual disturbances. Patient reports that her mother passed away in 11/12/2018, and she has been distressed since then, and does not feel that she has dealt with her grief. 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 Patient  reports that she has settled down in [place] and has lived there for 8 years. Patient reports that she has some significant medical concerns including congestive heart failure, double knee replacements, fibromyalgia, macular degeneration, in chronic pain in her legs and shoulders. Patient reports that she has gained a significant amount of weight recently because of water retention due to the congestive heart failure. Patient reports that she drinks alcohol socially, and smokes  marijuana occasionally. ? ?Current Symptoms/Problems: No data recorded ? ?Patient Reported Schizophrenia/Schizoaffective Diagnosis in Past: No ? ? ?Strengths: good levels of self awareness ? ?Preferences: out

## 2021-12-15 NOTE — Plan of Care (Signed)
Developed tx plan based on pt self reported input  

## 2021-12-21 ENCOUNTER — Ambulatory Visit (INDEPENDENT_AMBULATORY_CARE_PROVIDER_SITE_OTHER): Payer: PPO | Admitting: Psychiatry

## 2021-12-21 ENCOUNTER — Encounter: Payer: Self-pay | Admitting: Psychiatry

## 2021-12-21 VITALS — BP 103/72 | HR 85 | Temp 98.3°F | Ht 62.0 in | Wt 225.0 lb

## 2021-12-21 DIAGNOSIS — I341 Nonrheumatic mitral (valve) prolapse: Secondary | ICD-10-CM | POA: Insufficient documentation

## 2021-12-21 DIAGNOSIS — M797 Fibromyalgia: Secondary | ICD-10-CM | POA: Insufficient documentation

## 2021-12-21 DIAGNOSIS — F331 Major depressive disorder, recurrent, moderate: Secondary | ICD-10-CM | POA: Diagnosis not present

## 2021-12-21 DIAGNOSIS — G4700 Insomnia, unspecified: Secondary | ICD-10-CM | POA: Diagnosis not present

## 2021-12-21 DIAGNOSIS — E782 Mixed hyperlipidemia: Secondary | ICD-10-CM | POA: Insufficient documentation

## 2021-12-21 MED ORDER — HYDROXYZINE PAMOATE 25 MG PO CAPS: 25.0000 mg | ORAL_CAPSULE | Freq: Every day | ORAL | 1 refills | Status: DC | PRN

## 2021-12-21 NOTE — Progress Notes (Signed)
[facility] OP Progress Note ? ?12/21/2021 2:52 PM ?Mandy Jones  ?MRN:  [id number] ? ?Chief Complaint:  ?Chief Complaint  ?Patient presents with  ? Follow-up: 71 year old Caucasian female with history of depression, presented for medication management.  ? ?HPI: Mandy Jones is a 71 year old Caucasian female who is currently retired, lives in [place], has a history of MDD, multiple medical problems including bilateral carpal tunnel syndrome, fibromyalgia, chronic diastolic heart failure was evaluated in office today. ? ?Patient is currently under the care of our therapist-Ms. Christina Hussami-most recent visit was 12/14/2021.  I have reviewed notes-patient was diagnosed with MDD. ? ?Patient reports her symptoms of depression started around 2 years ago.  Patient reports at that time she received her COVID-19 vaccine booster.  She likely developed side effects to the vaccine and started having balance problems, tinnitus since then.  Patient reports this limited her physical ability to stay active and enjoy the things that she normally enjoyed in her life.  Patient reports she had to give up her hobby of photography as well as traveling and spending time with her friends due to the same.  Patient reports this started affecting her mood.  Patient reports she currently struggles with sadness, anhedonia, low motivation, low energy.  Patient also reports she has other medical problems like congestive heart failure, macular degeneration.  This has affected her ability to participate in certain activities as well as read books.  She currently listens to audiobooks only.  Patient reports she was evaluated by rheumatology on 12/13/2021.  Patient reports she was diagnosed with fibromyalgia and was started on Cymbalta.  She is currently on 30 mg daily of the Cymbalta.  She has not noticed much benefit from it yet.  She is aware that she needs to give it more time.  She is also aware that Cymbalta is also an  antidepressant. ? ?Patient reports currently she sleeps okay.  She does have trazodone available.  She is currently on a diuretic which does affect her sleep since she has to urinate at night. ? ?Patient does report a history of panic attacks however the last one was 2 years ago.  She does report having hydroxyzine available which was prescribed to her by her provider at that time.  She does not use it much however when she uses it it does help.  She also makes use of coping techniques like deep breathing.  That helps. ? ?Patient denies any suicidality, homicidality or perceptual disturbances. ? ?Patient is motivated to stay in therapy. ? ?Visit Diagnosis:  ?  ICD-10-CM   ?1. MDD (major depressive disorder), recurrent episode, moderate (HCC)  F33.1   ?  ?2. Insomnia, unspecified type  G47.00   ?  ? ? ?Past Psychiatric History: Patient denies being under the care of psychiatrist in the past.  Denies inpatient mental health admissions.  Patient denies suicide attempts.  Currently follows up with a therapist-Ms. Christina Hussami. ? ?Past Medical History:  ?Past Medical History:  ?Diagnosis Date  ? Arrhythmia   ? atrial fibrillation  ? Arthritis   ? CHF (congestive heart failure) ([place])   ? Depression   ? Dyspnea   ? Dysrhythmia   ? Fibromyalgia 1992  ? GERD (gastroesophageal reflux disease)   ? Hepatitis   ? Hepatitis B 1985  ? Hyperlipidemia   ? Hypertension   ? Mitral valve prolapse   ? Pacemaker   ? Pacemaker 01/13/2021  ? Sleep apnea   ? CPAP  ?  Tendon tear, ankle   ? rt foot  ? Thyroid dysfunction   ?  ?Past Surgical History:  ?Procedure Laterality Date  ? ACHILLES TENDON SURGERY Right 04/06/2018  ? Procedure: ACHILLES TENDON REPAIR;  Surgeon: [name], DPM;  Location: [facility] ORS;  Service: Podiatry;  Laterality: Right;  ? CARDIOVERSION N/A 02/06/2018  ? Procedure: CARDIOVERSION;  Surgeon: Corey Skains, MD;  Location: [facility];  Service: Cardiovascular;  Laterality: N/A;  ? CARDIOVERSION N/A 03/28/2018  ?  Procedure: CARDIOVERSION;  Surgeon: Corey Skains, MD;  Location: [facility];  Service: Cardiovascular;  Laterality: N/A;  ? CARDIOVERSION N/A 05/09/2018  ? Procedure: CARDIOVERSION;  Surgeon: Corey Skains, MD;  Location: [facility];  Service: Cardiovascular;  Laterality: N/A;  ? CARDIOVERSION N/A 10/23/2019  ? Procedure: CARDIOVERSION;  Surgeon: Corey Skains, MD;  Location: [facility];  Service: Cardiovascular;  Laterality: N/A;  ? CARDIOVERSION N/A 09/30/2020  ? Procedure: CARDIOVERSION;  Surgeon: Corey Skains, MD;  Location: [facility];  Service: Cardiovascular;  Laterality: N/A;  ? CARDIOVERSION N/A 11/19/2020  ? Procedure: CARDIOVERSION;  Surgeon: Corey Skains, MD;  Location: [facility];  Service: Cardiovascular;  Laterality: N/A;  ? CARDIOVERSION N/A 03/16/2021  ? Procedure: CARDIOVERSION;  Surgeon: Corey Skains, MD;  Location: [facility];  Service: Cardiovascular;  Laterality: N/A;  ? CHOLECYSTECTOMY    ? CONTINUOUS NERVE MONITORING N/A 08/03/2017  ? Procedure: LARYNGEAL NERVE MONITORING;  Surgeon: Carloyn Manner, MD;  Location: [facility];  Service: ENT;  Laterality: N/A;  ? EXTRACORPOREAL SHOCK WAVE LITHOTRIPSY Right 02/13/2020  ? Procedure: EXTRACORPOREAL SHOCK WAVE LITHOTRIPSY (ESWL);  Surgeon: Billey Co, MD;  Location: [facility];  Service: Urology;  Laterality: Right;  ? EYE SURGERY Bilateral   ? FOOT SURGERY Left 2000  ? JOINT REPLACEMENT    ? left knee  ? OSTECTOMY Right 04/06/2018  ? Procedure: OSTECTOMY-HAGLUNDS/RECTROCALCANEAL;  Surgeon: [name], DPM;  Location: [facility];  Service: Podiatry;  Laterality: Right;  ? SHOULDER ARTHROSCOPY Bilateral   ? SHOULDER ARTHROSCOPY Left   ? THYROIDECTOMY N/A 08/03/2017  ? Procedure: THYROIDECTOMY;  Surgeon: Carloyn Manner, MD;  Location: [facility];  Service: ENT;  Laterality: N/A;  ? TONSILLECTOMY    ? TUBAL LIGATION    ? ? ?Family Psychiatric History: Father-alcoholism. ? ?Family History:  ?Family History  ?Problem Relation Age of Onset  ?  Hypertension Mother   ? Alcohol abuse Father   ? Gastric cancer Father   ? ? 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  She was raised in [place].  She does have 1 brother however they are estranged.  Her mother passed away 2 years ago and her father passed away 10 years ago.  Patient is currently divorced.  She does not have any children.  She has a bachelor's degree.  She is currently retired.  She used to work as an Chief Executive Officer and traveled all around.  Patient also has a hobby of photography.  Patient currently lives in [place]. ?Social History  ? ?Socioeconomic History  ? Marital status: Divorced  ?  Spouse name: Not on file  ? Number of children: 0  ? Years of education: Not on file  ? Highest education level: Not on file  ?Occupational History  ? Occupation: retired   ?  Comment: computer documentation   ?Tobacco Use  ? Smoking status: Former  ? Smokeless tobacco: Never  ? Tobacco comments:  ?  quit 30 years ago  ?Vaping Use  ? Vaping Use: Never used  ?Substance and Sexual Activity  ? Alcohol use: Yes  ?  Comment: occassional glass of wine once a month  ? Drug use: Not Currently  ?  Types: Marijuana  ? Sexual activity: Not Currently  ?Other Topics Concern  ? Not on file  ?Social History Narrative  ? Lives alone: 3 dogs and a cat   ? ?Social Determinants of Health  ? ?Financial Resource Strain: Not on file  ?Food Insecurity: Not on file  ?Transportation Needs: Not on file  ?Physical Activity: Not on file  ?Stress: Not on file  ?Social Connections: Not on file  ? ? ?Allergies:  ?Allergies  ?Allergen Reactions  ? Silver Other (See Comments)  ?   ?Causes blisters.  ? Tape Rash  ?  Pads used for ablation left a rash for a couple of days.  ? ? ?Metabolic Disorder Labs: ?No results found for: HGBA1C, MPG ?No results found for: PROLACTIN ?No results found for: CHOL, TRIG, HDL, CHOLHDL, VLDL, LDLCALC ?Lab Results  ?Component Value  Date  ? TSH 9.326 (H) 02/09/2020  ? TSH 4.115 01/30/2018  ? ? ?Therapeutic Level Labs: ?No results found for: LITHIUM ?No results found for: VALPROATE ?No components found for:  CBMZ ? ?Current Medications: ?Current

## 2022-01-12 ENCOUNTER — Other Ambulatory Visit: Payer: Self-pay | Admitting: Psychiatry

## 2022-01-12 DIAGNOSIS — F331 Major depressive disorder, recurrent, moderate: Secondary | ICD-10-CM

## 2022-01-12 DIAGNOSIS — G4700 Insomnia, unspecified: Secondary | ICD-10-CM

## 2022-02-03 ENCOUNTER — Ambulatory Visit (INDEPENDENT_AMBULATORY_CARE_PROVIDER_SITE_OTHER): Payer: PPO | Admitting: Licensed Clinical Social Worker

## 2022-02-03 DIAGNOSIS — F331 Major depressive disorder, recurrent, moderate: Secondary | ICD-10-CM | POA: Diagnosis not present

## 2022-02-03 NOTE — Plan of Care (Signed)
  Problem: Depression Goal: LTG: Decrease depressive symptoms and improve levels of effective functioning-pt reports a decrease in overall depression symptoms 3 out of 5 sessions documented.  Outcome: Progressing Goal: STG: Mandy Jones WILL PARTICIPATE IN AT LEAST 80% OF SCHEDULED INDIVIDUAL PSYCHOTHERAPY SESSIONS Outcome: Progressing
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
## 2022-02-16 ENCOUNTER — Ambulatory Visit (INDEPENDENT_AMBULATORY_CARE_PROVIDER_SITE_OTHER): Payer: PPO | Admitting: Psychiatry

## 2022-02-16 ENCOUNTER — Encounter: Payer: Self-pay | Admitting: Psychiatry

## 2022-02-16 VITALS — BP 131/84 | HR 83 | Temp 98.5°F | Wt 214.6 lb

## 2022-02-16 DIAGNOSIS — Z9189 Other specified personal risk factors, not elsewhere classified: Secondary | ICD-10-CM | POA: Diagnosis not present

## 2022-02-16 DIAGNOSIS — G4701 Insomnia due to medical condition: Secondary | ICD-10-CM

## 2022-02-16 DIAGNOSIS — F331 Major depressive disorder, recurrent, moderate: Secondary | ICD-10-CM

## 2022-02-16 DIAGNOSIS — F419 Anxiety disorder, unspecified: Secondary | ICD-10-CM

## 2022-02-16 MED ORDER — DULOXETINE HCL 20 MG PO CPEP: 20.0000 mg | ORAL_CAPSULE | Freq: Every day | ORAL | 0 refills | Status: DC

## 2022-02-16 MED ORDER — CLONAZEPAM 0.5 MG PO TABS: 0.2500 mg | ORAL_TABLET | Freq: Every day | ORAL | 0 refills | Status: DC | PRN

## 2022-02-16 MED ORDER — DULOXETINE HCL 30 MG PO CPEP: 30.0000 mg | ORAL_CAPSULE | Freq: Every day | ORAL | 0 refills | Status: DC

## 2022-02-16 MED ORDER — ZOLPIDEM TARTRATE ER 6.25 MG PO TBCR: 6.2500 mg | EXTENDED_RELEASE_TABLET | Freq: Every evening | ORAL | 0 refills | Status: DC | PRN

## 2022-02-16 NOTE — Patient Instructions (Signed)
Stop Trazodone. Reduce Cymbalta to 50 mg daily. Start Ambien 6.25 mg daily at bedtime for sleep. Start Klonopin 0.25 mg- 0.5 mg as needed for anxiety - 1-2 times a week if needed only. Stop Hydroxyzine as needed. Please get EKG of your heart.Please call for EKG - [phone]   Clonazepam Tablets What is this medication? CLONAZEPAM (kloe NA ze pam) treats seizures. It may also be used to treat panic disorder. It works by [facility] system calm down. It belongs to a group of medications called benzodiazepines. This medicine may be used for other purposes; ask your health care provider or pharmacist if you have questions. COMMON BRAND NAME(S): Ceberclon, Klonopin What should I tell my care team before I take this medication? They need to know if you have any of these conditions: An alcohol or drug abuse problem Bipolar disorder, depression, psychosis or other mental health condition Glaucoma Kidney or liver disease Lung or breathing disease Myasthenia gravis Parkinson disease Porphyria Seizures or a history of seizures Suicidal thoughts An unusual or allergic reaction to clonazepam, other benzodiazepines, foods, dyes, or preservatives Pregnant or trying to get pregnant Breast-feeding How should I use this medication? Take this medication by mouth with a glass of water. Follow the directions on the prescription label. If it upsets your stomach, take it with food or milk. Take your medication at regular intervals. Do not take it more often than directed. Do not stop taking or change the dose except on the advice of your care team. A special MedGuide will be given to you by the pharmacist with each prescription and refill. Be sure to read this information carefully each time. Talk to your care team regarding the use of this medication in children. Special care may be needed. Overdosage: If you think you have taken too much of this medicine contact a poison control center or  emergency room at once. NOTE: This medicine is only for you. Do not share this medicine with others. What if I miss a dose? If you miss a dose, take it as soon as you can. If it is almost time for your next dose, take only that dose. Do not take double or extra doses. What may interact with this medication? Do not take this medication with any of the following: Narcotic medications for cough Sodium oxybate This medication may also interact with the following: Alcohol Antihistamines for allergy, cough and cold Antiviral medications for HIV or AIDS Certain medications for anxiety or sleep Certain medications for depression, like amitriptyline, fluoxetine, sertraline Certain medications for fungal infections like ketoconazole and itraconazole Certain medications for seizures like carbamazepine, phenobarbital, phenytoin, primidone General anesthetics like halothane, isoflurane, methoxyflurane, propofol Local anesthetics like lidocaine, pramoxine, tetracaine Medications that relax muscles for surgery Narcotic medications for pain Phenothiazines like chlorpromazine, mesoridazine, prochlorperazine, thioridazine This list may not describe all possible interactions. Give your health care provider a list of all the medicines, herbs, non-prescription drugs, or dietary supplements you use. Also tell them if you smoke, drink alcohol, or use illegal drugs. Some items may interact with your medicine. What should I watch for while using this medication? Tell your care team if your symptoms do not start to get better or if they get worse. Do not stop taking except on your care team's advice. You may develop a severe reaction. Your care team will tell you how much medication to take. You may get drowsy or dizzy. Do not drive, use machinery, or do anything that needs mental  alertness until you know how this medication affects you. To reduce the risk of dizzy and fainting spells, do not stand or sit up quickly,  especially if you are an older patient. Alcohol may increase dizziness and drowsiness. Avoid alcoholic drinks. If you are taking another medication that also causes drowsiness, you may have more side effects. Give your care team a list of all medications you use. Your care team will tell you how much medication to take. Do not take more medication than directed. Call emergency services if you have problems breathing or unusual sleepiness. The use of this medication may increase the chance of suicidal thoughts or actions. Pay special attention to how you are responding while on this medication. Any worsening of mood, or thoughts of suicide or dying should be reported to your care team right away. What side effects may I notice from receiving this medication? Side effects that you should report to your care team as soon as possible: Allergic reactions--skin rash, itching, hives, swelling of the face, lips, tongue, or throat CNS depression--slow or shallow breathing, shortness of breath, feeling faint, dizziness, confusion, trouble staying awake Thoughts of suicide or self-harm, worsening mood, feelings of depression Side effects that usually do not require medical attention (report to your care team if they continue or are bothersome): Dizziness Drowsiness Headache This list may not describe all possible side effects. Call your doctor for medical advice about side effects. You may report side effects to FDA at 1-800-FDA-1088. Where should I keep my medication? Keep out of the reach of children and pets. This medication can be abused. Keep your medication in a safe place to protect it from theft. Do not share this medication with anyone. Selling or giving away this medication is dangerous and against the law. Store at room temperature between 15 and 30 degrees C (59 and 86 degrees F). Protect from light. Keep container tightly closed. This medication may cause accidental overdose and death if taken by  other adults, children, or pets. Mix any unused medication with a substance like cat litter or coffee grounds. Then throw the medication away in a sealed container like a sealed bag or a coffee can with a lid. Do not use the medication after the expiration date. NOTE: This sheet is a summary. It may not cover all possible information. If you have questions about this medicine, talk to your doctor, pharmacist, or health care provider.  2023 [employer]vier/Gold Standard (2020-09-11 00:00:00) Zolpidem Tablets What is this medication? ZOLPIDEM (zole PI dem) treats insomnia. It helps you get to sleep faster and stay asleep throughout the night. It is often used for a short period of time. This medicine may be used for other purposes; ask your health care provider or pharmacist if you have questions. COMMON BRAND NAME(S): Ambien What should I tell my care team before I take this medication? They need to know if you have any of these conditions: Depression History of drug abuse or addiction If you often drink alcohol Liver disease Lung or breathing disease Myasthenia gravis Sleep apnea Sleep-walking, driving, eating or other activity while not fully awake after taking a sleep medication Suicidal thoughts, plans, or attempt; a previous suicide attempt by you or a family member An unusual or allergic reaction to zolpidem, other medications, foods, dyes, or preservatives Pregnant or trying to get pregnant Breast-feeding How should I use this medication? Take this medication by mouth with a glass of water. Follow the directions on the prescription label. It is  better to take this medication on an empty stomach and only when you are ready for bed. Do not take your medication more often than directed. If you have been taking this medication for several weeks and suddenly stop taking it, you may get unpleasant withdrawal symptoms. Your care team may want to gradually reduce the dose. Do not stop taking this  medication on your own. Always follow your care team's advice. A special MedGuide will be given to you by the pharmacist with each prescription and refill. Be sure to read this information carefully each time. Talk to your care team regarding the use of this medication in children. Special care may be needed. Overdosage: If you think you have taken too much of this medicine contact a poison control center or emergency room at once. NOTE: This medicine is only for you. Do not share this medicine with others. What if I miss a dose? This does not apply. This medication should only be taken immediately before going to sleep. Do not take double or extra doses. What may interact with this medication? Alcohol Antihistamines for allergy, cough and cold Certain medications for anxiety or sleep Certain medications for depression, like amitriptyline, fluoxetine, sertraline Certain medications for fungal infections like ketoconazole and itraconazole Certain medications for seizures like phenobarbital, primidone Ciprofloxacin Dietary supplements for sleep, like valerian or kava kava General anesthetics like halothane, isoflurane, methoxyflurane, propofol Local anesthetics like lidocaine, pramoxine, tetracaine Medications that relax muscles for surgery Narcotic medications for pain Phenothiazines like chlorpromazine, mesoridazine, prochlorperazine, thioridazine Rifampin This list may not describe all possible interactions. Give your health care provider a list of all the medicines, herbs, non-prescription drugs, or dietary supplements you use. Also tell them if you smoke, drink alcohol, or use illegal drugs. Some items may interact with your medicine. What should I watch for while using this medication? Visit your care team for regular checks on your progress. Keep a regular sleep schedule by going to bed at about the same time each night. Avoid caffeine-containing drinks in the evening hours. When sleep  medications are used every night for more than a few weeks, they may stop working. Talk to your care team if you still have trouble sleeping. After taking this medication, you may get up out of bed and do an activity that you do not know you are doing. The next morning, you may have no memory of this. Activities include driving a car ("sleep-driving"), making and eating food, talking on the phone, sexual activity, and sleep-walking. Serious injuries have occurred. Stop the medication and call your care team right away if you find out you have done any of these activities. Do not take this medication if you have used alcohol that evening. Do not take it if you have taken another medication for sleep. The risk of doing these sleep-related activities is higher. Wait for at least 8 hours after you take a dose before driving or doing other activities that require full mental alertness. Do not take this medication unless you are able to stay in bed for a full night (7 to 8 hours) before you must be active again. You may have a decrease in mental alertness the day after use, even if you feel that you are fully awake. Tell your care team if you will need to perform activities requiring full alertness, such as driving, the next day. Do not stand or sit up quickly after taking this medication, especially if you are an older patient. This reduces the  risk of dizzy or fainting spells. If you or your family notice any changes in your behavior, such as new or worsening depression, thoughts of harming yourself, anxiety, other unusual or disturbing thoughts, or memory loss, call your care team right away. After you stop taking this medication, you may have trouble falling asleep. This is called rebound insomnia. This problem usually goes away on its own after 1 or 2 nights. What side effects may I notice from receiving this medication? Side effects that you should report to your care team as soon as possible: Allergic  reactions--skin rash, itching, hives, swelling of the face, lips, tongue, or throat Change in vision such as blurry vision, seeing halos around lights, vision loss CNS depression--slow or shallow breathing, shortness of breath, feeling faint, dizziness, confusion, difficulty staying awake Mood and behavior changes--anxiety, nervousness, confusion, hallucinations, irritability, hostility, thoughts of suicide or self-harm, worsening mood, feelings of depression Unusual sleep behaviors or activities you do not remember such as driving, eating, or sexual activity Side effects that usually do not require medical attention (report to your care team if they continue or are bothersome): Diarrhea Dizziness Drowsiness the day after use Headache This list may not describe all possible side effects. Call your doctor for medical advice about side effects. You may report side effects to FDA at 1-800-FDA-1088. Where should I keep my medication? Keep out of the reach of children and pets. This medication can be abused. Keep your medication in a safe place to protect it from theft. Do not share this medication with anyone. Selling or giving away this medication is dangerous and against the law. Store at room temperature between 20 and 25 degrees C (68 and 77 degrees F). This medication may cause accidental overdose and death if taken by other adults, children, or pets. Mix any unused medication with a substance like cat litter or coffee grounds. Then throw the medication away in a sealed container like a sealed bag or a coffee can with a lid. Do not use the medication after the expiration date. NOTE: This sheet is a summary. It may not cover all possible information. If you have questions about this medicine, talk to your doctor, pharmacist, or health care provider.  2023 [employer]/Gold Standard (2020-09-11 00:00:00)

## 2022-02-16 NOTE — Progress Notes (Signed)
[place] MD OP Progress Note  02/16/2022 4:40 PM Mandy Jones [facility]  MRN:  [id number]  Chief Complaint:  Chief Complaint  Patient presents with   Follow-up: 71 year old Caucasian female with history of depression, presented for medication management.   HPI: Mandy Jones is a 71 year old Caucasian female who is currently retired, lives in [place], has a history of MDD, multiple medical problems including bilateral carpal tunnel syndrome, fibromyalgia, chronic diastolic heart failure was evaluated in office today.  Patient today reports she continues to struggle with sadness, low motivation, low energy.  Patient also has a lot of anxiety about her current situational stressors.  She reports she does not have family here and hence would like to move to a place where she will have more support and amenities for her dogs.  She is planning to sell her home and move to [place] or [place].  That does make her anxious since it is a lot of changes.  Patient became tearful when she discussed this.  Patient also reports she has noticed some tremors of her bilateral upper extremities as worsened since being on the higher dosage of Cymbalta.  She increased the Cymbalta as discussed to 60 mg beginning of June.  Patient also has fibromyalgia, arthritis, polyneuropathy which could also be causing the tremors.  She continues to have significant balance issues although she does not want to use a walker or cane yet.  That does make her sad to even think about that.  Patient has upcoming appointment with her neurologist.  Patient struggles with sleep.  The trazodone does not help anymore.  She was using the Cymbalta 30 mg at night.  She reports she changed it to a 60 mg together in the morning.  That also does not seem to help her sleep to get better.  Patient denies any suicidality, homicidality or perceptual disturbances.  Patient denies any other concerns today.  Visit Diagnosis:    ICD-10-CM   1. MDD  (major depressive disorder), recurrent episode, moderate (HCC)  F33.1 DULoxetine (CYMBALTA) 20 MG capsule    DULoxetine (CYMBALTA) 30 MG capsule    EKG 12-Lead    2. Insomnia due to medical condition  G47.01 zolpidem (AMBIEN CR) 6.25 MG CR tablet   diuretics, mood, OSA    3. Anxiety disorder, unspecified type  F41.9 clonazePAM (KLONOPIN) 0.5 MG tablet    DULoxetine (CYMBALTA) 20 MG capsule    DULoxetine (CYMBALTA) 30 MG capsule    4. At risk for prolonged QT interval syndrome  Z91.89 EKG 12-Lead      Past Psychiatric History: Reviewed past psychiatric history from progress note on 12/21/2021.  Past trials of medications like trazodone.  Past Medical History:  Past Medical History:  Diagnosis Date   Arrhythmia    atrial fibrillation   Arthritis    CHF (congestive heart failure) (HCC)    Depression    Dyspnea    Dysrhythmia    Fibromyalgia 1992   GERD (gastroesophageal reflux disease)    Hepatitis    Hepatitis B 1985   Hyperlipidemia    Hypertension    Mitral valve prolapse    Pacemaker    Pacemaker 01/13/2021   Sleep apnea    CPAP   Tendon tear, ankle    rt foot   Thyroid dysfunction     Past Surgical History:  Procedure Laterality Date   ACHILLES TENDON SURGERY Right 04/06/2018   Procedure: ACHILLES TENDON REPAIR;  Surgeon: [name], DPM;  Location: [facility];  Service: Podiatry;  Laterality: Right;   CARDIOVERSION N/A 02/06/2018   Procedure: CARDIOVERSION;  Surgeon: Corey Skains, MD;  Location: [facility];  Service: Cardiovascular;  Laterality: N/A;   CARDIOVERSION N/A 03/28/2018   Procedure: CARDIOVERSION;  Surgeon: Corey Skains, MD;  Location: [facility];  Service: Cardiovascular;  Laterality: N/A;   CARDIOVERSION N/A 05/09/2018   Procedure: CARDIOVERSION;  Surgeon: Corey Skains, MD;  Location: [facility];  Service: Cardiovascular;  Laterality: N/A;   CARDIOVERSION N/A 10/23/2019   Procedure: CARDIOVERSION;  Surgeon: Corey Skains, MD;  Location:  [place] [facility];  Service: Cardiovascular;  Laterality: N/A;   CARDIOVERSION N/A 09/30/2020   Procedure: CARDIOVERSION;  Surgeon: Corey Skains, MD;  Location: [facility];  Service: Cardiovascular;  Laterality: N/A;   CARDIOVERSION N/A 11/19/2020   Procedure: CARDIOVERSION;  Surgeon: Corey Skains, MD;  Location: [facility];  Service: Cardiovascular;  Laterality: N/A;   CARDIOVERSION N/A 03/16/2021   Procedure: CARDIOVERSION;  Surgeon: Corey Skains, MD;  Location: [facility];  Service: Cardiovascular;  Laterality: N/A;   CHOLECYSTECTOMY     CONTINUOUS NERVE MONITORING N/A 08/03/2017   Procedure: LARYNGEAL NERVE MONITORING;  Surgeon: Carloyn Manner, MD;  Location: [facility];  Service: ENT;  Laterality: N/A;   EXTRACORPOREAL SHOCK WAVE LITHOTRIPSY Right 02/13/2020   Procedure: EXTRACORPOREAL SHOCK WAVE LITHOTRIPSY (ESWL);  Surgeon: Billey Co, MD;  Location: [facility];  Service: Urology;  Laterality: Right;   EYE SURGERY Bilateral    FOOT SURGERY Left 2000   JOINT REPLACEMENT     left knee   OSTECTOMY Right 04/06/2018   Procedure: OSTECTOMY-HAGLUNDS/RECTROCALCANEAL;  Surgeon: [name], DPM;  Location: [facility];  Service: Podiatry;  Laterality: Right;   SHOULDER ARTHROSCOPY Bilateral    SHOULDER ARTHROSCOPY Left    THYROIDECTOMY N/A 08/03/2017   Procedure: THYROIDECTOMY;  Surgeon: Carloyn Manner, MD;  Location: [facility];  Service: ENT;  Laterality: N/A;   TONSILLECTOMY     TUBAL LIGATION      Family Psychiatric History: Reviewed family psychiatric history from progress note on 12/21/2021.  Family History:  Family History  Problem Relation Age of Onset   Hypertension Mother    Alcohol abuse Father    Gastric cancer Father     Social History: Reviewed social history from progress note on 12/21/2021. Social History   Socioeconomic History   Marital status: Divorced    Spouse name: Not on file   Number of children: 0   Years of education: Not on file   Highest education  level: Not on file  Occupational History   Occupation: retired     Comment: Astronomer   Tobacco Use   Smoking status: Former   Smokeless tobacco: Never   Tobacco comments:    quit 30 years ago  Scientific laboratory technician Use: Never used  Substance and Sexual Activity   Alcohol use: Yes    Comment: occassional glass of wine once a month   Drug use: Not Currently    Types: Marijuana   Sexual activity: Not Currently  Other Topics Concern   Not on file  Social History Narrative   Lives alone: 3 dogs and a Neurosurgeon    Social Determinants of Health   Financial Resource Strain: Not on file  Food Insecurity: Not on file  Transportation Needs: Not on file  Physical Activity: Not on file  Stress: Not on file  Social Connections: Not on file    Allergies:  Allergies  Allergen Reactions  Silver Other (See Comments)     Causes blisters.   Tape Rash    Pads used for ablation left a rash for a couple of days.    Metabolic Disorder Labs: No results found for: "HGBA1C", "MPG" No results found for: "PROLACTIN" No results found for: "CHOL", "TRIG", "HDL", "CHOLHDL", "VLDL", "LDLCALC" Lab Results  Component Value Date   TSH 9.326 (H) 02/09/2020   TSH 4.115 01/30/2018    Therapeutic Level Labs: No results found for: "LITHIUM" No results found for: "VALPROATE" No results found for: "CBMZ"  Current Medications: Current Outpatient Medications  Medication Sig Dispense Refill   apixaban (ELIQUIS) 5 MG TABS tablet Take 5 mg by mouth every 12 (twelve) hours. 60 tablet 0   Ascorbic Acid (VITAMIN C) 1000 MG tablet Take 1,000 mg by mouth every evening.     B Complex-C (SUPER B COMPLEX PO) Take 1 tablet by mouth daily.     Boswellia-Glucosamine-Vit D (OSTEO BI-FLEX ONE PER DAY) TABS Take 1 tablet by mouth daily.     Calcium-Magnesium-Vitamin D (CALCIUM MAGNESIUM PO) Take 1 tablet by mouth daily.     clonazePAM (KLONOPIN) 0.5 MG tablet Take 0.5-1 tablets (0.25-0.5 mg total) by mouth  daily as needed for anxiety. Please limit use 21 tablet 0   Coenzyme Q10 (CO Q 10) 100 MG CAPS Take 100 mg by mouth daily.     DULoxetine (CYMBALTA) 20 MG capsule Take 1 capsule (20 mg total) by mouth daily. Take along with 30 mg daily 90 capsule 0   empagliflozin (JARDIANCE) 10 MG TABS tablet Take 1 tablet by mouth daily before breakfast.     famotidine (PEPCID) 20 MG tablet Take 40 mg by mouth at bedtime.     levothyroxine (SYNTHROID) 150 MCG tablet Take 150 mcg by mouth daily before breakfast.     melatonin 5 MG TABS Take 5 mg by mouth at bedtime as needed (sleep).     metoprolol succinate (TOPROL-XL) 25 MG 24 hr tablet Take 12.5 mg by mouth daily. Takes every other day     Multiple Vitamins-Minerals (PRESERVISION AREDS 2 PO) Take 1 tablet by mouth daily.     potassium chloride (KLOR-CON) 10 MEQ tablet Take 20 mEq by mouth daily.     Red Yeast Rice Extract 600 MG CAPS Take 600 mg by mouth every evening.     sacubitril-valsartan (ENTRESTO) 24-26 MG Take 1 tablet by mouth 2 (two) times daily. 60 tablet 5   spironolactone (ALDACTONE) 25 MG tablet Take 1 tablet (25 mg total) by mouth every other day. 90 tablet 3   torsemide (DEMADEX) 100 MG tablet Take 0.5 tablets (50 mg total) by mouth daily. 30 tablet 3   XIIDRA 5 % SOLN Apply 1 drop to eye 2 (two) times daily.     zolpidem (AMBIEN CR) 6.25 MG CR tablet Take 1 tablet (6.25 mg total) by mouth at bedtime as needed for sleep. 30 tablet 0   Ashwagandha 500 MG CAPS Take 1,000 mg by mouth daily. (Patient not taking: Reported on 09/07/2021)     DULoxetine (CYMBALTA) 30 MG capsule Take 1 capsule (30 mg total) by mouth daily. Take along with 20 mg daily 90 capsule 0   No current facility-administered medications for this visit.     Musculoskeletal: Strength & Muscle Tone: within normal limits Gait & Station:  slow Patient leans: N/A  Psychiatric Specialty Exam: Review of Systems  Musculoskeletal:  Positive for myalgias.  Psychiatric/Behavioral:   Positive for dysphoric mood and  sleep disturbance. The patient is nervous/anxious.   All other systems reviewed and are negative.   Blood pressure 131/84, pulse 83, temperature 98.5 F (36.9 C), temperature source Temporal, weight 214 lb 9.6 oz (97.3 kg).Body mass index is 39.25 kg/m.  General Appearance: Casual  Eye Contact:  Fair  Speech:  Normal Rate  Volume:  Normal  Mood:  Anxious and Depressed  Affect:  Tearful  Thought Process:  Goal Directed and Descriptions of Associations: Intact  Orientation:  Full (Time, Place, and Person)  Thought Content: Logical   Suicidal Thoughts:  No  Homicidal Thoughts:  No  Memory:  Immediate;   Fair Recent;   Fair Remote;   Fair  Judgement:  Fair  Insight:  Fair  Psychomotor Activity:  Normal  Concentration:  Concentration: Fair and Attention Span: Fair  Recall:  [employer] of Knowledge: Fair  Language: Fair  Akathisia:  No  Handed:  Right  AIMS (if indicated): done  Assets:  Communication Skills Desire for Improvement Housing Talents/Skills Transportation  ADL's:  Intact  Cognition: WNL  Sleep:   poor   Screenings: [place] Office Visit from 02/16/2022 in [place] Total Score 0      [place] Visit from 02/16/2022 in [place] Office Visit from 12/21/2021 in [place] Office Visit from 09/07/2021 in [place] Office Visit from 07/28/2021 in [place] Office Visit from 04/21/2021 in [place]  Total GAD-7 Score '9 3 2 2 '$ 0      PHQ2-9    [place] Visit from 02/16/2022 in [place] Office Visit from 12/21/2021 in [place] Counselor from 12/14/2021 in [place] Office  Visit from 09/07/2021 in [facility] from 08/31/2021 in [facility]  PHQ-2 Total Score '4 6 6 4 3  '$ PHQ-9 Total Score '15 14 19 9 15      '$ [place] Office Visit from 02/16/2022 in [place] Office Visit from 12/21/2021 in [place] [facility]or from 12/14/2021 in [place] No Risk No Risk No Risk        Assessment and Plan: Kielyn Kardell is a 71 year old Caucasian female, divorced, retired, lives in [place], has a history of fibromyalgia, congestive heart failure, macular degeneration, thyroid abnormalities, depression, anxiety was evaluated in office today.  Patient continues to struggle with balance problems when she walks and that does have an impact on her mood.  Patient also with anxiety due to multiple psychosocial stressors, will benefit from the following plan.  Plan MDD-improving Review of Cymbalta up to 50 mg p.o. daily due to side effects of tremors worsening on the higher dosage. We will consider adding a mood stabilizer or another antidepressant to augment the Cymbalta however prior to that will need an EKG. Discontinue hydroxyzine. Continue CBT with Ms. Christina Hussami  Insomnia-unstable Discontinue trazodone for lack of benefit Start Ambien extended release 6.25 mg p.o. nightly Discussed sleep hygiene techniques.  Anxiety unspecified-rule out generalized anxiety disorder-unstable Cymbalta will help. Start clonazepam 0.25-0.5 mg as needed for severe anxiety attacks only, advised to limit use.  Discussed long-term risk of being on benzodiazepine therapy also given her balance issues, risk of  falls. Continue CBT.  At risk for prolonged QT syndrome-we will order EKG-provided phone #[phone].  Follow-up in clinic in 2 to 3 weeks or sooner if needed.   This note was  generated in part or whole with voice recognition software. Voice recognition is usually quite accurate but there are transcription errors that can and very often do occur. I apologize for any typographical errors that were not detected and corrected.     Ursula Alert, MD 02/16/2022, 4:40 PM

## 2022-03-01 NOTE — Progress Notes (Unsigned)
Patient ID: Mandy Jones, female    DOB: [birth year], 71 y.o.   MRN: [id number]  HPI  Mandy Jones is a 71 y/o female with a history of atrial fibrillation, HTN, thyroid disease, GERD, sleep apnea, depression, fibromyalgia, hyperlipidemia, previous tobacco use and chronic heart failure.   Echo report from 12/09/21 reviewed and showed an EF of 35% along with mild LVH. Echo report from 10/15/20 reviewed and showed an EF of 40%. Echo report from 02/10/20 reviewed and showed an EF of 55-60% along with mild LAE.   Has not been admitted or been in the ED in the last 6 months  She presents today for a follow-up visit with a chief complaint of minimal fatigue upon moderate exertion. Describes this as chronic in nature. She has associated shortness of breath, abdominal distention, left shoulder pain, depression and difficulty sleeping along with this. She denies any palpitations, pedal edema, chest pain, cough, dizziness or weight gain.   Is being treated for her mental health but then developed jitteriness so med is being down titrated. Psychiatrist requests EKG to be done due to possible QT prolongation with mental health medications.   She says that she's considering moving [place] community or to [place] to an adjoined townhouse where she has many friends. She is unsure if she can tolerate the [place] weather. Both places have a gym, clubhouse etc  Past Medical History:  Diagnosis Date   Arrhythmia    atrial fibrillation   Arthritis    CHF (congestive heart failure) ([facility])    Depression    Dyspnea    Dysrhythmia    Fibromyalgia 1992   GERD (gastroesophageal reflux disease)    Hepatitis    Hepatitis B 1985   Hyperlipidemia    Hypertension    Mitral valve prolapse    Pacemaker    Pacemaker 01/13/2021   Sleep apnea    CPAP   Tendon tear, ankle    rt foot   Thyroid dysfunction    Past Surgical History:  Procedure Laterality Date   ACHILLES  TENDON SURGERY Right 04/06/2018   Procedure: ACHILLES TENDON REPAIR;  Surgeon: [name], DPM;  Location: [facility];  Service: Podiatry;  Laterality: Right;   CARDIOVERSION N/A 02/06/2018   Procedure: CARDIOVERSION;  Surgeon: Corey Skains, MD;  Location: [facility];  Service: Cardiovascular;  Laterality: N/A;   CARDIOVERSION N/A 03/28/2018   Procedure: CARDIOVERSION;  Surgeon: Corey Skains, MD;  Location: [facility];  Service: Cardiovascular;  Laterality: N/A;   CARDIOVERSION N/A 05/09/2018   Procedure: CARDIOVERSION;  Surgeon: Corey Skains, MD;  Location: [facility];  Service: Cardiovascular;  Laterality: N/A;   CARDIOVERSION N/A 10/23/2019   Procedure: CARDIOVERSION;  Surgeon: Corey Skains, MD;  Location: [facility];  Service: Cardiovascular;  Laterality: N/A;   CARDIOVERSION N/A 09/30/2020   Procedure: CARDIOVERSION;  Surgeon: Corey Skains, MD;  Location: [facility];  Service: Cardiovascular;  Laterality: N/A;   CARDIOVERSION N/A 11/19/2020   Procedure: CARDIOVERSION;  Surgeon: Corey Skains, MD;  Location: [facility];  Service: Cardiovascular;  Laterality: N/A;   CARDIOVERSION N/A 03/16/2021   Procedure: CARDIOVERSION;  Surgeon: Corey Skains, MD;  Location: [facility];  Service: Cardiovascular;  Laterality: N/A;   CHOLECYSTECTOMY     CONTINUOUS NERVE MONITORING N/A 08/03/2017   Procedure: LARYNGEAL NERVE MONITORING;  Surgeon: Carloyn Manner, MD;  Location: [facility];  Service: ENT;  Laterality: N/A;   EXTRACORPOREAL  SHOCK WAVE LITHOTRIPSY Right 02/13/2020   Procedure: EXTRACORPOREAL SHOCK WAVE LITHOTRIPSY (ESWL);  Surgeon: Billey Co, MD;  Location: [facility];  Service: Urology;  Laterality: Right;   EYE SURGERY Bilateral    FOOT SURGERY Left 2000   JOINT REPLACEMENT     left knee   OSTECTOMY Right 04/06/2018   Procedure: OSTECTOMY-HAGLUNDS/RECTROCALCANEAL;  Surgeon: [name], DPM;  Location: [facility];  Service: Podiatry;  Laterality: Right;   SHOULDER  ARTHROSCOPY Bilateral    SHOULDER ARTHROSCOPY Left    THYROIDECTOMY N/A 08/03/2017   Procedure: THYROIDECTOMY;  Surgeon: Carloyn Manner, MD;  Location: [facility];  Service: ENT;  Laterality: N/A;   TONSILLECTOMY     TUBAL LIGATION     Family History  Problem Relation Age of Onset   Hypertension Mother    Alcohol abuse Father    Gastric cancer Father    Social History   Tobacco Use   Smoking status: Former   Smokeless tobacco: Never   Tobacco comments:    quit 30 years ago  Substance Use Topics   Alcohol use: Yes    Comment: occassional glass of wine once a month   Allergies  Allergen Reactions   Silver Other (See Comments)     Causes blisters.   Tape Rash    Pads used for ablation left a rash for a couple of days.   Prior to Admission medications   Medication Sig Start Date End Date Taking? Authorizing Provider  apixaban (ELIQUIS) 5 MG TABS tablet Take 5 mg by mouth every 12 (twelve) hours. 02/14/20  Yes Val Riles, MD  Ascorbic Acid (VITAMIN C) 1000 MG tablet Take 1,000 mg by mouth every evening.   Yes [provider]  B Complex-C (SUPER B COMPLEX PO) Take 1 tablet by mouth daily.   Yes [provider]  Boswellia-Glucosamine-Vit D (OSTEO BI-FLEX ONE PER DAY) TABS Take 1 tablet by mouth daily.   Yes [provider]  Calcium-Magnesium-Vitamin D (CALCIUM MAGNESIUM PO) Take 1 tablet by mouth daily.   Yes [provider]  clonazePAM (KLONOPIN) 0.5 MG tablet Take 0.5-1 tablets (0.25-0.5 mg total) by mouth daily as needed for anxiety. Please limit use 02/16/22  Yes Eappen, Ria Clock, MD  Coenzyme Q10 (CO Q 10) 100 MG CAPS Take 100 mg by mouth daily.   Yes [provider]  DULoxetine (CYMBALTA) 20 MG capsule Take 1 capsule (20 mg total) by mouth daily. Take along with 30 mg daily 02/16/22  Yes Eappen, Ria Clock, MD  DULoxetine (CYMBALTA) 30 MG capsule Take 1 capsule (30 mg total) by mouth daily. Take along with 20 mg daily 02/16/22  Yes  Eappen, Ria Clock, MD  empagliflozin (JARDIANCE) 10 MG TABS tablet Take 1 tablet by mouth daily before breakfast. 09/12/21  Yes [provider]  famotidine (PEPCID) 20 MG tablet Take 20 mg by mouth 2 (two) times daily. 10/09/19  Yes [provider]  levothyroxine (SYNTHROID) 150 MCG tablet Take 150 mcg by mouth daily before breakfast. 10/15/19  Yes [provider]  melatonin 5 MG TABS Take 5 mg by mouth at bedtime as needed (sleep).   Yes [provider]  metoprolol succinate (TOPROL-XL) 25 MG 24 hr tablet Take 12.5 mg by mouth daily. 11/24/21 11/24/22 Yes [provider]  Multiple Vitamins-Minerals (PRESERVISION AREDS 2 PO) Take 1 tablet by mouth daily.   Yes [provider]  potassium chloride (KLOR-CON) 10 MEQ tablet Take 20 mEq by mouth daily. 09/12/20  Yes [provider]  Red Yeast Rice Extract 600 MG CAPS Take 600 mg by mouth every evening.   Yes [provider]  sacubitril-valsartan (ENTRESTO) 24-26 MG Take 1 tablet by mouth 2 (two) times daily. 03/03/21  Yes Darylene Price A, FNP  spironolactone (ALDACTONE) 25 MG tablet Take 1 tablet (25 mg total) by mouth every other day. 11/30/21 03/02/22 Yes Iori Gigante, Otila Kluver A, FNP  torsemide (DEMADEX) 100 MG tablet Take 0.5 tablets (50 mg total) by mouth daily. 07/28/21  Yes Tevyn Codd A, FNP  XIIDRA 5 % SOLN Apply 1 drop to eye 2 (two) times daily. 05/25/21  Yes [provider]  zolpidem (AMBIEN CR) 6.25 MG CR tablet Take 1 tablet (6.25 mg total) by mouth at bedtime as needed for sleep. 02/16/22  Yes Ursula Alert, MD    Review of Systems  Constitutional:  Positive for fatigue. Negative for appetite change.  HENT:  Negative for congestion, postnasal drip and sore throat.   Eyes: Negative.   Respiratory:  Positive for shortness of breath (minimal). Negative for cough and chest tightness.   Cardiovascular:  Negative for chest pain, palpitations and leg swelling.  Gastrointestinal:   Positive for abdominal distention. Negative for abdominal pain.  Endocrine: Negative.   Genitourinary: Negative.   Musculoskeletal:  Positive for arthralgias (left shoulder). Negative for back pain and myalgias.  Skin:  Negative for rash.       Itching lower abdomen  Allergic/Immunologic: Negative.   Neurological:  Negative for dizziness and light-headedness.  Hematological:  Negative for adenopathy. Does not bruise/bleed easily.  Psychiatric/Behavioral:  Positive for dysphoric mood (no better) and sleep disturbance (interrupted sleep; sleeping on 1-2 pillows; wearing CPAP most nights). Negative for suicidal ideas. The patient is not nervous/anxious.    Vitals:   03/02/22 1117  BP: 103/65  Pulse: 100  Resp: 16  SpO2: 96%  Weight: 227 lb 4 oz (103.1 kg)  Height: '5\' 2"'$  (1.575 m)   Wt Readings from Last 3 Encounters:  03/02/22 227 lb 4 oz (103.1 kg)  11/30/21 230 lb (104.3 kg)  10/07/21 230 lb 3 oz (104.4 kg)   Lab Results  Component Value Date   CREATININE 0.75 10/07/2021   CREATININE 0.62 09/07/2021   CREATININE 0.74 04/21/2021    Physical Exam Vitals and nursing note reviewed.  Constitutional:      Appearance: Normal appearance.  HENT:     Head: Normocephalic and atraumatic.  Cardiovascular:     Rate and Rhythm: Normal rate. Rhythm irregular.  Pulmonary:     Effort: Pulmonary effort is normal. No respiratory distress.     Breath sounds: No wheezing or rales.  Abdominal:     General: There is no distension.     Palpations: Abdomen is soft.  Musculoskeletal:        General: No tenderness.     Cervical back: Normal range of motion and neck supple.     Right lower leg: No edema.     Left lower leg: No edema.  Skin:    General: Skin is warm and dry.  Neurological:     General: No focal deficit present.     Mental Status: She is alert and oriented to person, place, and time.  Psychiatric:        Mood and Affect: Mood normal.        Behavior: Behavior normal.         Thought Content: Thought content normal.    Assessment & Plan:  1: Chronic heart failure with reduced  ejection fraction- - NYHA class II - euvolemic today - weighing daily; reminded to call for an overnight weight gain of >2 pounds or a weekly weight gain of >5 pounds - weight down 3 pounds from last visit here 3 months ago - not adding salt except to pasta water - saw cardiology Petra Kuba) 01/17/22 - on GDMT of entresto, jardiance, metoprolol & spironolactone - participating in pulmonary rehab - saw pulmonology Raul Del) 01/25/22 - BNP 02/22/21 was 108.8 - PharmD reconciled medications with the patient  2: HTN- - BP looks good (103/65) - previously saw PCP Quentin Cornwall) will now see [facility] on 05/17/22 due to insurance change - BMP 10/07/21 reviewed and showed sodium 137, potassium 4.0, creatinine 0.75 and GFR >60  3: Atrial fibrillation- - previous ablation & cardioversion done  - on apixaban & metoprolol succinate - saw EP Marcello Moores) 04/09/21  4: Depression- - admits to worsening depression but no suicidal thoughts/ plans - has 2 large dogs that she wants to live for - saw psychiatry 02/16/22 - is looking to move at the end of the year to [place] the mountains [place] or to [place]  5: Risk of prolonged QT- - EKG done in office today per psychiatry request due to risk of QT prolongation with medications   Patient did not bring her medications nor a list. Each medication was verbally reviewed with the patient and she was encouraged to bring the bottles to every visit to confirm accuracy of list.  Return in 4 months, sooner if needed.

## 2022-03-02 ENCOUNTER — Encounter: Payer: Self-pay | Admitting: Pharmacist

## 2022-03-02 ENCOUNTER — Ambulatory Visit: Payer: HMO | Attending: Family | Admitting: Family

## 2022-03-02 ENCOUNTER — Encounter: Payer: Self-pay | Admitting: Family

## 2022-03-02 VITALS — BP 103/65 | HR 100 | Resp 16 | Ht 62.0 in | Wt 227.2 lb

## 2022-03-02 DIAGNOSIS — R5383 Other fatigue: Secondary | ICD-10-CM | POA: Insufficient documentation

## 2022-03-02 DIAGNOSIS — I10 Essential (primary) hypertension: Secondary | ICD-10-CM | POA: Diagnosis not present

## 2022-03-02 DIAGNOSIS — I110 Hypertensive heart disease with heart failure: Secondary | ICD-10-CM | POA: Diagnosis not present

## 2022-03-02 DIAGNOSIS — K219 Gastro-esophageal reflux disease without esophagitis: Secondary | ICD-10-CM | POA: Insufficient documentation

## 2022-03-02 DIAGNOSIS — M25512 Pain in left shoulder: Secondary | ICD-10-CM | POA: Insufficient documentation

## 2022-03-02 DIAGNOSIS — R0602 Shortness of breath: Secondary | ICD-10-CM | POA: Diagnosis not present

## 2022-03-02 DIAGNOSIS — E785 Hyperlipidemia, unspecified: Secondary | ICD-10-CM | POA: Insufficient documentation

## 2022-03-02 DIAGNOSIS — F329 Major depressive disorder, single episode, unspecified: Secondary | ICD-10-CM

## 2022-03-02 DIAGNOSIS — R140 Abdominal distension (gaseous): Secondary | ICD-10-CM | POA: Insufficient documentation

## 2022-03-02 DIAGNOSIS — Z87891 Personal history of nicotine dependence: Secondary | ICD-10-CM | POA: Diagnosis not present

## 2022-03-02 DIAGNOSIS — Z9189 Other specified personal risk factors, not elsewhere classified: Secondary | ICD-10-CM | POA: Diagnosis not present

## 2022-03-02 DIAGNOSIS — Z7901 Long term (current) use of anticoagulants: Secondary | ICD-10-CM | POA: Insufficient documentation

## 2022-03-02 DIAGNOSIS — Z7984 Long term (current) use of oral hypoglycemic drugs: Secondary | ICD-10-CM | POA: Diagnosis not present

## 2022-03-02 DIAGNOSIS — I5022 Chronic systolic (congestive) heart failure: Secondary | ICD-10-CM | POA: Diagnosis not present

## 2022-03-02 DIAGNOSIS — I4891 Unspecified atrial fibrillation: Secondary | ICD-10-CM | POA: Insufficient documentation

## 2022-03-02 DIAGNOSIS — M797 Fibromyalgia: Secondary | ICD-10-CM | POA: Diagnosis not present

## 2022-03-02 DIAGNOSIS — I480 Paroxysmal atrial fibrillation: Secondary | ICD-10-CM

## 2022-03-02 DIAGNOSIS — Z79899 Other long term (current) drug therapy: Secondary | ICD-10-CM | POA: Diagnosis not present

## 2022-03-02 DIAGNOSIS — F32A Depression, unspecified: Secondary | ICD-10-CM | POA: Diagnosis not present

## 2022-03-02 DIAGNOSIS — E079 Disorder of thyroid, unspecified: Secondary | ICD-10-CM | POA: Diagnosis not present

## 2022-03-02 NOTE — Patient Instructions (Signed)
Continue weighing daily and call for an overnight weight gain of 3 pounds or more or a weekly weight gain of more than 5 pounds.   If you have voicemail, please make sure your mailbox is cleaned out so that we may leave a message and please make sure to listen to any voicemails.     

## 2022-03-03 NOTE — Progress Notes (Signed)
Patient ID: Mandy Jones, female   DOB: 11-Feb-1951, 71 y.o.   MRN: [id number] [facility] COUNSELING NOTE  Guideline-Directed Medical Therapy/Evidence Based Medicine  ACE/ARB/ARNI: Sacubitril-valsartan 24-26 mg twice daily Beta Blocker: Metoprolol succinate 12.5 mg daily Aldosterone Antagonist: Spironolactone 25 mg daily Diuretic: Torsemide 50 mg daily SGLT2i: Empagliflozin 10 mg daily  Adherence Assessment  Do you ever forget to take your medication? '[]'$ Yes '[x]'$ No  Do you ever skip doses due to side effects? '[]'$ Yes '[x]'$ No  Do you have trouble affording your medicines? '[]'$ Yes '[x]'$ No  Are you ever unable to pick up your medication due to transportation difficulties? '[]'$ Yes '[x]'$ No  Do you ever stop taking your medications because you don't believe they are helping? '[]'$ Yes '[x]'$ No  Do you check your weight daily? '[x]'$ Yes '[]'$ No   Barriers to obtaining medications: none  Vital signs: HR 100, BP 103/65, weight (pounds) 227 ECHO: Date 12/09/21, EF 35%, mild LVH     Latest Ref Rng & Units 10/07/2021    1:08 PM 09/07/2021    1:32 PM 04/21/2021   10:55 AM  BMP  Glucose 70 - 99 mg/dL 108  89  102   BUN 8 - 23 mg/dL '25  18  17   '$ Creatinine 0.44 - 1.00 mg/dL 0.75  0.62  0.74   Sodium 135 - 145 mmol/L 137  137  139   Potassium 3.5 - 5.1 mmol/L 4.0  4.3  4.2   Chloride 98 - 111 mmol/L 102  101  103   CO2 22 - 32 mmol/L '27  27  29   '$ Calcium 8.9 - 10.3 mg/dL 9.4  9.5  9.4     Past Medical History:  Diagnosis Date   Arrhythmia    atrial fibrillation   Arthritis    CHF (congestive heart failure) ([facility])    Depression    Dyspnea    Dysrhythmia    Fibromyalgia 1992   GERD (gastroesophageal reflux disease)    Hepatitis    Hepatitis B 1985   Hyperlipidemia    Hypertension    Mitral valve prolapse    Pacemaker    Pacemaker 01/13/2021   Sleep apnea    CPAP   Tendon tear, ankle    rt foot   Thyroid dysfunction      ASSESSMENT 71 year old female who presents to the HF clinic for follow up. PMH includes atrial fibrillation, HTN, thyroid disease, GERD, sleep apnea, depression, fibromyalgia, hyperlipidemia, previous tobacco use and chronic heart failure.   Ms Moncure is currently working with [facility] provider to manage her depression. Will repeat ECG today to assess Qtc interval previously at 551m while on trazodone, and Cymbalta '60mg'$ . Recent medication change includes decrease Cymbalta to '50mg'$  daily, discontinued trazodone, and start clonazepam.  Patient also had a question about potential medication usage for weight management.  PLAN  NO medication change today Report ECG results to psy provider to re-assessment prior to additional medication changes. May be a candidate for GLP1 therapy, but will recommend waiting until her behavioral health medications titrated and stable.   Time spent: 20 minutes  Emmalina Espericueta Rodriguez-Guzman PharmD, BCPS 03/03/2022 3:17 PM     Current Outpatient Medications:    apixaban (ELIQUIS) 5 MG TABS tablet, Take 5 mg by mouth every 12 (twelve) hours., Disp: 60 tablet, Rfl: 0   Ascorbic Acid (VITAMIN C) 1000 MG tablet, Take 1,000 mg by mouth every evening., Disp: , Rfl:    B Complex-C (SUPER  B COMPLEX PO), Take 1 tablet by mouth daily., Disp: , Rfl:    Boswellia-Glucosamine-Vit D (OSTEO BI-FLEX ONE PER DAY) TABS, Take 1 tablet by mouth daily., Disp: , Rfl:    Calcium-Magnesium-Vitamin D (CALCIUM MAGNESIUM PO), Take 1 tablet by mouth daily., Disp: , Rfl:    clonazePAM (KLONOPIN) 0.5 MG tablet, Take 0.5-1 tablets (0.25-0.5 mg total) by mouth daily as needed for anxiety. Please limit use, Disp: 21 tablet, Rfl: 0   Coenzyme Q10 (CO Q 10) 100 MG CAPS, Take 100 mg by mouth daily., Disp: , Rfl:    DULoxetine (CYMBALTA) 20 MG capsule, Take 1 capsule (20 mg total) by mouth daily. Take along with 30 mg daily, Disp: 90 capsule, Rfl: 0   DULoxetine (CYMBALTA) 30 MG capsule, Take 1 capsule  (30 mg total) by mouth daily. Take along with 20 mg daily, Disp: 90 capsule, Rfl: 0   empagliflozin (JARDIANCE) 10 MG TABS tablet, Take 1 tablet by mouth daily before breakfast., Disp: , Rfl:    famotidine (PEPCID) 20 MG tablet, Take 20 mg by mouth 2 (two) times daily., Disp: , Rfl:    levothyroxine (SYNTHROID) 150 MCG tablet, Take 150 mcg by mouth daily before breakfast., Disp: , Rfl:    melatonin 5 MG TABS, Take 5 mg by mouth at bedtime as needed (sleep)., Disp: , Rfl:    metoprolol succinate (TOPROL-XL) 25 MG 24 hr tablet, Take 12.5 mg by mouth daily., Disp: , Rfl:    Multiple Vitamins-Minerals (PRESERVISION AREDS 2 PO), Take 1 tablet by mouth daily., Disp: , Rfl:    potassium chloride (KLOR-CON) 10 MEQ tablet, Take 20 mEq by mouth daily., Disp: , Rfl:    Red Yeast Rice Extract 600 MG CAPS, Take 600 mg by mouth every evening., Disp: , Rfl:    sacubitril-valsartan (ENTRESTO) 24-26 MG, Take 1 tablet by mouth 2 (two) times daily., Disp: 60 tablet, Rfl: 5   spironolactone (ALDACTONE) 25 MG tablet, Take 1 tablet (25 mg total) by mouth every other day., Disp: 90 tablet, Rfl: 3   torsemide (DEMADEX) 100 MG tablet, Take 0.5 tablets (50 mg total) by mouth daily., Disp: 30 tablet, Rfl: 3   XIIDRA 5 % SOLN, Apply 1 drop to eye 2 (two) times daily., Disp: , Rfl:    zolpidem (AMBIEN CR) 6.25 MG CR tablet, Take 1 tablet (6.25 mg total) by mouth at bedtime as needed for sleep., Disp: 30 tablet, Rfl: 0

## 2022-03-16 ENCOUNTER — Encounter: Payer: Self-pay | Admitting: Psychiatry

## 2022-03-16 ENCOUNTER — Ambulatory Visit (INDEPENDENT_AMBULATORY_CARE_PROVIDER_SITE_OTHER): Payer: PPO | Admitting: Psychiatry

## 2022-03-16 VITALS — BP 105/71 | HR 85 | Temp 98.1°F | Wt 228.2 lb

## 2022-03-16 DIAGNOSIS — G4701 Insomnia due to medical condition: Secondary | ICD-10-CM

## 2022-03-16 DIAGNOSIS — F419 Anxiety disorder, unspecified: Secondary | ICD-10-CM

## 2022-03-16 DIAGNOSIS — F331 Major depressive disorder, recurrent, moderate: Secondary | ICD-10-CM

## 2022-03-16 DIAGNOSIS — Z9189 Other specified personal risk factors, not elsewhere classified: Secondary | ICD-10-CM | POA: Diagnosis not present

## 2022-03-16 MED ORDER — DULOXETINE HCL 30 MG PO CPEP: 30.0000 mg | ORAL_CAPSULE | Freq: Every day | ORAL | 0 refills | Status: DC

## 2022-03-16 MED ORDER — LAMOTRIGINE 25 MG PO TABS: 25.0000 mg | ORAL_TABLET | Freq: Every day | ORAL | 1 refills | Status: DC

## 2022-03-16 NOTE — Progress Notes (Signed)
[place] MD OP Progress Note  03/16/2022 1:43 PM Mandy Jones [facility]  MRN:  [id number]  Chief Complaint:  Chief Complaint  Patient presents with   Follow-up: 71 year old Caucasian female with history of MDD, insomnia, anxiety, presented for medication management.   HPI: Mandy Jones is a 71 year old Caucasian female who is currently retired, lives in [place], has a history of MDD, multiple medical problems including bilateral carpal tunnel syndrome, fibromyalgia, chronic diastolic heart failure was evaluated in office today.  Patient today reports she continues to feel depressed, has lack of motivation, low energy, as well as struggles with anxiety.  She reports she is planning to list her home for safety and is planning to move into a senior living community.  That has been anxiety provoking for her.  She does report having tremors on and off of her hands mostly on the left side.  She believes this is likely due to the duloxetine since it started with the addition of the duloxetine.Patient does not believe the duloxetine dose reduction has helped at all with the tremors.    Reports sleep is good when she takes the Ambien.  She however has been limiting use.  Did have an EKG completed, dated 03/02/2022.  She reports her cardiologist was able to review it with her.  She does have atrial fibrillation which has been ongoing since the past several years.  She she was told her EKG as better compared to the previous one.  She reports she continues to have follow-up appointment scheduled with cardiology.  Reviewed the same with [id number].  Patient continues to have psychotherapy sessions, motivated to stay in therapy.  Patient denies any suicidality, homicidality or perceptual disturbances.  Patient denies any other concerns today.  Visit Diagnosis:    ICD-10-CM   1. MDD (major depressive disorder), recurrent episode, moderate (HCC)  F33.1 lamoTRIgine (LAMICTAL) 25 MG tablet     DULoxetine (CYMBALTA) 30 MG capsule    2. Insomnia due to medical condition  G47.01 lamoTRIgine (LAMICTAL) 25 MG tablet   Mood, diuretics, OSA    3. Anxiety disorder, unspecified type  F41.9     4. At risk for prolonged QT interval syndrome  Z91.89       Past Psychiatric History: Reviewed past psychiatric history from progress note on 12/21/2021.  Past Medical History:  Past Medical History:  Diagnosis Date   Arrhythmia    atrial fibrillation   Arthritis    CHF (congestive heart failure) (HCC)    Depression    Dyspnea    Dysrhythmia    Fibromyalgia 1992   GERD (gastroesophageal reflux disease)    Hepatitis    Hepatitis B 1985   Hyperlipidemia    Hypertension    Mitral valve prolapse    Pacemaker    Pacemaker 01/13/2021   Sleep apnea    CPAP   Tendon tear, ankle    rt foot   Thyroid dysfunction     Past Surgical History:  Procedure Laterality Date   ACHILLES TENDON SURGERY Right 04/06/2018   Procedure: ACHILLES TENDON REPAIR;  Surgeon: [name], DPM;  Location: [facility];  Service: Podiatry;  Laterality: Right;   CARDIOVERSION N/A 02/06/2018   Procedure: CARDIOVERSION;  Surgeon: Corey Skains, MD;  Location: [facility];  Service: Cardiovascular;  Laterality: N/A;   CARDIOVERSION N/A 03/28/2018   Procedure: CARDIOVERSION;  Surgeon: Corey Skains, MD;  Location: [facility];  Service: Cardiovascular;  Laterality: N/A;   CARDIOVERSION N/A 05/09/2018   Procedure:  CARDIOVERSION;  Surgeon: Corey Skains, MD;  Location: [facility];  Service: Cardiovascular;  Laterality: N/A;   CARDIOVERSION N/A 10/23/2019   Procedure: CARDIOVERSION;  Surgeon: Corey Skains, MD;  Location: [facility];  Service: Cardiovascular;  Laterality: N/A;   CARDIOVERSION N/A 09/30/2020   Procedure: CARDIOVERSION;  Surgeon: Corey Skains, MD;  Location: [facility];  Service: Cardiovascular;  Laterality: N/A;   CARDIOVERSION N/A 11/19/2020   Procedure: CARDIOVERSION;  Surgeon: Corey Skains,  MD;  Location: [facility];  Service: Cardiovascular;  Laterality: N/A;   CARDIOVERSION N/A 03/16/2021   Procedure: CARDIOVERSION;  Surgeon: Corey Skains, MD;  Location: [facility];  Service: Cardiovascular;  Laterality: N/A;   CHOLECYSTECTOMY     CONTINUOUS NERVE MONITORING N/A 08/03/2017   Procedure: LARYNGEAL NERVE MONITORING;  Surgeon: Carloyn Manner, MD;  Location: [facility];  Service: ENT;  Laterality: N/A;   EXTRACORPOREAL SHOCK WAVE LITHOTRIPSY Right 02/13/2020   Procedure: EXTRACORPOREAL SHOCK WAVE LITHOTRIPSY (ESWL);  Surgeon: Billey Co, MD;  Location: [facility];  Service: Urology;  Laterality: Right;   EYE SURGERY Bilateral    FOOT SURGERY Left 2000   JOINT REPLACEMENT     left knee   OSTECTOMY Right 04/06/2018   Procedure: OSTECTOMY-HAGLUNDS/RECTROCALCANEAL;  Surgeon: [name], DPM;  Location: [facility];  Service: Podiatry;  Laterality: Right;   SHOULDER ARTHROSCOPY Bilateral    SHOULDER ARTHROSCOPY Left    THYROIDECTOMY N/A 08/03/2017   Procedure: THYROIDECTOMY;  Surgeon: Carloyn Manner, MD;  Location: [facility];  Service: ENT;  Laterality: N/A;   TONSILLECTOMY     TUBAL LIGATION      Family Psychiatric History: Reviewed family psychiatric history from progress note on 12/21/2021.  Family History:  Family History  Problem Relation Age of [place]   Hypertension Mother    Alcohol abuse Father    Gastric cancer Father     Social History: Reviewed social history from progress note on 12/21/2021. Social History   Socioeconomic History   Marital status: Divorced    Spouse name: Not on file   Number of children: 0   Years of education: Not on file   Highest education level: Not on file  Occupational History   Occupation: retired     Comment: Astronomer   Tobacco Use   Smoking status: Former   Smokeless tobacco: Never   Tobacco comments:    quit 30 years ago  Scientific laboratory technician Use: Never used  Substance and Sexual Activity   Alcohol use: Yes     Comment: occassional glass of wine once a month   Drug use: Not Currently    Types: Marijuana   Sexual activity: Not Currently  Other Topics Concern   Not on file  Social History Narrative   Lives alone: 3 dogs and a Neurosurgeon    Social Determinants of Health   Financial Resource Strain: Not on file  Food Insecurity: Not on file  Transportation Needs: Not on file  Physical Activity: Not on file  Stress: Not on file  Social Connections: Not on file    Allergies:  Allergies  Allergen Reactions   Silver Other (See Comments)     Causes blisters.   Tape Rash    Pads used for ablation left a rash for a couple of days.    Metabolic Disorder Labs: No results found for: "HGBA1C", "MPG" No results found for: "PROLACTIN" No results found for: "CHOL", "TRIG", "HDL", "CHOLHDL", "VLDL", "LDLCALC" Lab Results  Component Value Date  TSH 9.326 (H) 02/09/2020   TSH 4.115 01/30/2018    Therapeutic Level Labs: No results found for: "LITHIUM" No results found for: "VALPROATE" No results found for: "CBMZ"  Current Medications: Current Outpatient Medications  Medication Sig Dispense Refill   apixaban (ELIQUIS) 5 MG TABS tablet Take 5 mg by mouth every 12 (twelve) hours. 60 tablet 0   Ascorbic Acid (VITAMIN C) 1000 MG tablet Take 1,000 mg by mouth every evening.     B Complex-C (SUPER B COMPLEX PO) Take 1 tablet by mouth daily.     Boswellia-Glucosamine-Vit D (OSTEO BI-FLEX ONE PER DAY) TABS Take 1 tablet by mouth daily.     Calcium-Magnesium-Vitamin D (CALCIUM MAGNESIUM PO) Take 1 tablet by mouth daily.     clonazePAM (KLONOPIN) 0.5 MG tablet Take 0.5-1 tablets (0.25-0.5 mg total) by mouth daily as needed for anxiety. Please limit use 21 tablet 0   Coenzyme Q10 (CO Q 10) 100 MG CAPS Take 100 mg by mouth daily.     empagliflozin (JARDIANCE) 10 MG TABS tablet Take 1 tablet by mouth daily before breakfast.     famotidine (PEPCID) 20 MG tablet Take 20 mg by mouth 2 (two) times daily.      lamoTRIgine (LAMICTAL) 25 MG tablet Take 1 tablet (25 mg total) by mouth daily. 30 tablet 1   levothyroxine (SYNTHROID) 150 MCG tablet Take 150 mcg by mouth daily before breakfast.     metoprolol succinate (TOPROL-XL) 25 MG 24 hr tablet Take 12.5 mg by mouth daily.     Multiple Vitamins-Minerals (PRESERVISION AREDS 2 PO) Take 1 tablet by mouth daily.     potassium chloride (KLOR-CON) 10 MEQ tablet Take 20 mEq by mouth daily.     Red Yeast Rice Extract 600 MG CAPS Take 600 mg by mouth every evening.     sacubitril-valsartan (ENTRESTO) 24-26 MG Take 1 tablet by mouth 2 (two) times daily. 60 tablet 5   torsemide (DEMADEX) 100 MG tablet Take 0.5 tablets (50 mg total) by mouth daily. 30 tablet 3   XIIDRA 5 % SOLN Apply 1 drop to eye 2 (two) times daily.     zolpidem (AMBIEN CR) 6.25 MG CR tablet Take 1 tablet (6.25 mg total) by mouth at bedtime as needed for sleep. 30 tablet 0   DULoxetine (CYMBALTA) 30 MG capsule Take 1 capsule (30 mg total) by mouth daily. Dose change - stop 20 mg 90 capsule 0   melatonin 5 MG TABS Take 5 mg by mouth at bedtime as needed (sleep). (Patient not taking: Reported on 03/16/2022)     spironolactone (ALDACTONE) 25 MG tablet Take 1 tablet (25 mg total) by mouth every other day. 90 tablet 3   No current facility-administered medications for this visit.     Musculoskeletal: Strength & Muscle Tone: within normal limits Gait & Station:  slow Patient leans: N/A  Psychiatric Specialty Exam: Review of Systems  Constitutional:  Positive for fatigue.  Musculoskeletal:  Positive for arthralgias and neck pain.  Psychiatric/Behavioral:  Positive for dysphoric mood. The patient is nervous/anxious.   All other systems reviewed and are negative.   Blood pressure 105/71, pulse 85, temperature 98.1 F (36.7 C), temperature source Temporal, weight 228 lb 3.2 oz (103.5 kg).Body mass index is 41.74 kg/m.  General Appearance: Casual  Eye Contact:  Fair  Speech:  Clear and Coherent   Volume:  Normal  Mood:  Anxious and Depressed  Affect:  Congruent  Thought Process:  Goal Directed and Descriptions of  Associations: Intact  Orientation:  Full (Time, Place, and Person)  Thought Content: Logical   Suicidal Thoughts:  No  Homicidal Thoughts:  No  Memory:  Immediate;   Fair Recent;   Fair Remote;   Fair  Judgement:  Fair  Insight:  Fair  Psychomotor Activity:  Normal  Concentration:  Concentration: Fair and Attention Span: Fair  Recall:  [employer] of Knowledge: Fair  Language: Fair  Akathisia:  No  Handed:  Right  AIMS (if indicated): done  Assets:  Communication Skills Desire for Improvement Housing Social Support  ADL's:  Intact  Cognition: WNL  Sleep:  Fair when she takes the medication its good   Screenings: Administrator, Civil Service Office Visit from 03/16/2022 in [place] Office Visit from 02/16/2022 in [place] Total Score 0 0      [place] Visit from 03/16/2022 in [place] Office Visit from 03/02/2022 in [place] Office Visit from 02/16/2022 in [place] Office Visit from 12/21/2021 in [place] Office Visit from 09/07/2021 in [place]  Total GAD-7 Score '11 7 9 3 2      '$ PHQ2-9    [place] Office Visit from 03/16/2022 in [place] Office Visit from 03/02/2022 in [place] Office Visit from 02/16/2022 in [place] Office Visit from 12/21/2021 in [place] Counselor from 12/14/2021 in [place]  PHQ-2 Total Score '5 6 4 6 6  '$ PHQ-9 Total Score '14 16 15 14 19      '$ [place] Visit from 03/16/2022 in [place] Office Visit from 02/16/2022 in [place] Office Visit from 12/21/2021 in [place] No Risk No Risk No Risk        Assessment and Plan: Mandy Jones is a 71 year old Caucasian female, divorced, retired, lives in [place], has a history of fibromyalgia, congestive heart failure, macular degeneration, thyroid abnormality, depression was evaluated in office today.  Patient with possible side effects to Cymbalta, continues to struggle with depression, anxiety.  Will benefit from the following plan.  Plan  MDD-unstable Reduce Cymbalta to 30 mg p.o. daily due to side effects. Start Lamictal 25 mg p.o. daily.  Provided medication education, discussed Stevens-Johnson syndrome. Continue CBT with Ms. Christina Hussami  Anxiety unspecified-unstable Continue CBT with Ms. Gotham Hussami Patient to discuss with rheumatology regarding changing Cymbalta since she is having side effects.  Cymbalta was initiated per rheumatology.  Once she has that visit with rheumatology, we will consider adding another SSRI or SNRI for symptom control. Continue Klonopin 0.25-0.5 mg as needed for severe anxiety attacks only.  She has been limiting use.  At risk for prolonged QT syndrome-reviewed EKG dated 03/02/2022 as noted above.  Patient has upcoming cardiology visit.  Follow-up in clinic in 5 weeks or sooner if needed.  This note was generated in part or whole with voice recognition software. Voice recognition is usually quite accurate but there are transcription errors that can and very often do occur. I apologize for any typographical errors that were not detected and corrected.      Ursula Alert, MD 03/16/2022, 1:43 PM

## 2022-03-16 NOTE — Patient Instructions (Signed)
Lamotrigine Tablets What is this medication? LAMOTRIGINE (la MOE tri jeen) prevents and controls seizures in people with epilepsy. It may also be used to treat bipolar disorder. It works by calming overactive nerves in your body. This medicine may be used for other purposes; ask your health care provider or pharmacist if you have questions. COMMON BRAND NAME(S): Lamictal, Subvenite What should I tell my care team before I take this medication? They need to know if you have any of these conditions: Heart disease History of irregular heartbeat Immune system problems Kidney disease Liver disease Low levels of folic acid in the blood Lupus Mental illness Suicidal thoughts, plans, or attempt; a previous suicide attempt by you or a family member An unusual or allergic reaction to lamotrigine or other seizure medications, other medications, foods, dyes, or preservatives Pregnant or trying to get pregnant Breast-feeding How should I use this medication? Take this medication by mouth with a glass of water. Follow the directions on the prescription label. Do not chew these tablets. If this medication upsets your stomach, take it with food or milk. Take your doses at regular intervals. Do not take your medication more often than directed. A special MedGuide will be given to you by the pharmacist with each new prescription and refill. Be sure to read this information carefully each time. Talk to your care team about the use of this medication in children. While this medication may be prescribed for children as young as 2 years for selected conditions, precautions do apply. Overdosage: If you think you have taken too much of this medicine contact a poison control center or emergency room at once. NOTE: This medicine is only for you. Do not share this medicine with others. What if I miss a dose? If you miss a dose, take it as soon as you can. If it is almost time for your next dose, take only that dose.  Do not take double or extra doses. What may interact with this medication? Atazanavir Birth control pills Certain medications for irregular heartbeat Certain medications for seizures like carbamazepine, phenobarbital, phenytoin, primidone, valproic acid Lopinavir Rifampin Ritonavir This list may not describe all possible interactions. Give your health care provider a list of all the medicines, herbs, non-prescription drugs, or dietary supplements you use. Also tell them if you smoke, drink alcohol, or use illegal drugs. Some items may interact with your medicine. What should I watch for while using this medication? Visit your care team for regular checks on your progress. If you take this medication for seizures, wear a Medic Alert bracelet or necklace. Carry an identification card with information about your condition, medications, and care team. It is important to take this medication exactly as directed. When first starting treatment, your dose will need to be adjusted slowly. It may take weeks or months before your dose is stable. You should contact your care team if your seizures get worse or if you have any new types of seizures. Do not stop taking this medication unless instructed by your care team. Stopping your medication suddenly can increase your seizures or their severity. This medication may cause serious skin reactions. They can happen weeks to months after starting the medication. Contact your care team right away if you notice fevers or flu-like symptoms with a rash. The rash may be red or purple and then turn into blisters or peeling of the skin. Or, you might notice a red rash with swelling of the face, lips or lymph nodes in your   neck or under your arms. You may get drowsy, dizzy, or have blurred vision. Do not drive, use machinery, or do anything that needs mental alertness until you know how this medication affects you. To reduce dizzy or fainting spells, do not sit or stand up  quickly, especially if you are an older patient. Alcohol can increase drowsiness and dizziness. Avoid alcoholic drinks. If you are taking this medication for bipolar disorder, it is important to report any changes in your mood to your care team. If your condition gets worse, you get mentally depressed, feel very hyperactive or manic, have difficulty sleeping, or have thoughts of hurting yourself or committing suicide, you need to get help from your care team right away. If you are a caregiver for someone taking this medication for bipolar disorder, you should also report these behavioral changes right away. The use of this medication may increase the chance of suicidal thoughts or actions. Pay special attention to how you are responding while on this medication. Your mouth may get dry. Chewing sugarless gum or sucking hard candy, and drinking plenty of water may help. Contact your care team if the problem does not go away or is severe. Women who become pregnant while using this medication may enroll in the [place] Pregnancy Registry by calling [phone]. This registry collects information about the safety of antiepileptic medication use during pregnancy. This medication may cause a decrease in folic acid. You should make sure that you get enough folic acid while you are taking this medication. Discuss the foods you eat and the vitamins you take with your care team. What side effects may I notice from receiving this medication? Side effects that you should report to your care team as soon as possible: Allergic reactions--skin rash, itching, hives, swelling of the face, lips, tongue, or throat Change in vision Fever, neck pain or stiffness, sensitivity to light, headache, nausea, vomiting, confusion Heart rhythm changes--fast or irregular heartbeat, dizziness, feeling faint or lightheaded, chest pain, trouble breathing Infection--fever, chills, cough, or sore throat Liver  injury--right upper belly pain, loss of appetite, nausea, light-colored stool, dark yellow or brown urine, yellowing skin or eyes, unusual weakness or fatigue Low red blood cell count--unusual weakness or fatigue, dizziness, headache, trouble breathing Rash, fever, and swollen lymph nodes Redness, blistering, peeling or loosening of the skin, including inside the mouth Thoughts of suicide or self-harm, worsening mood, or feelings of depression Unusual bruising or bleeding Side effects that usually do not require medical attention (report to your care team if they continue or are bothersome): Diarrhea Dizziness Drowsiness Headache Nausea Stomach pain Tremors or shaking This list may not describe all possible side effects. Call your doctor for medical advice about side effects. You may report side effects to FDA at 1-800-FDA-1088. Where should I keep my medication? Keep out of the reach of children and pets. Store at [employer] C (77 degrees F). Protect from light. Get rid of any unused medication after the expiration date. To get rid of medications that are no longer needed or have expired: Take the medication to a medication take-back program. Check with your pharmacy or law enforcement to find a location. If you cannot return the medication, check the label or package insert to see if the medication should be thrown out in the garbage or flushed down the toilet. If you are not sure, ask your care team. If it is safe to put it in the trash, empty the medication out of the  container. Mix the medication with cat litter, dirt, coffee grounds, or other unwanted substance. Seal the mixture in a bag or container. Put it in the trash. NOTE: This sheet is a summary. It may not cover all possible information. If you have questions about this medicine, talk to your doctor, pharmacist, or health care provider.  2023 Elsevier/Gold Standard (2021-07-16 00:00:00)

## 2022-03-21 ENCOUNTER — Ambulatory Visit (INDEPENDENT_AMBULATORY_CARE_PROVIDER_SITE_OTHER): Payer: PPO | Admitting: Licensed Clinical Social Worker

## 2022-03-21 ENCOUNTER — Telehealth: Payer: Self-pay | Admitting: Licensed Clinical Social Worker

## 2022-03-21 DIAGNOSIS — Z91199 Patient's noncompliance with other medical treatment and regimen due to unspecified reason: Secondary | ICD-10-CM

## 2022-03-21 DIAGNOSIS — Z91198 Patient's noncompliance with other medical treatment and regimen for other reason: Secondary | ICD-10-CM

## 2022-03-21 NOTE — Progress Notes (Signed)
LCSW counselor tried to connect with patient for scheduled appointment via MyChart video text request x 2 and email request; also tried to connect via phone without success. LCSW counselor left message for patient to call office number to reschedule OPT appointment.   Attempt 1: Text and email: 10:05a  Attempt 2: Text and email:10:12a   Attempt 3: phone call: 10:16 left message  This visit will be coded as No Show

## 2022-03-21 NOTE — Progress Notes (Signed)
Patient ID: Mandy Jones, female   DOB: Jul 19, 1951, 71 y.o.   MRN: [id number]   **pt called back and stated that she was having internet issues

## 2022-03-21 NOTE — Telephone Encounter (Signed)
LCSW counselor tried to connect with patient for scheduled appointment via MyChart video text request x 2 and email request; also tried to connect via phone without success. LCSW counselor left message for patient to call office number to reschedule OPT appointment.   Attempt 1: Text and email: 10:05a  Attempt 2: Text and email:10:12a   Attempt 3: phone call: 10:16 left message  This visit will be coded as No Show

## 2022-03-21 NOTE — Addendum Note (Signed)
Addended by: Jeanmarie Plant R on: 03/21/2022 01:01 PM   Modules accepted: Level of Service

## 2022-03-24 ENCOUNTER — Other Ambulatory Visit: Payer: Self-pay | Admitting: Family

## 2022-03-29 ENCOUNTER — Ambulatory Visit: Payer: PPO | Admitting: Psychiatry

## 2022-04-07 ENCOUNTER — Other Ambulatory Visit: Payer: Self-pay | Admitting: Psychiatry

## 2022-04-07 DIAGNOSIS — F331 Major depressive disorder, recurrent, moderate: Secondary | ICD-10-CM

## 2022-04-07 DIAGNOSIS — G4700 Insomnia, unspecified: Secondary | ICD-10-CM

## 2022-04-08 ENCOUNTER — Other Ambulatory Visit: Payer: Self-pay | Admitting: Psychiatry

## 2022-04-08 DIAGNOSIS — G4701 Insomnia due to medical condition: Secondary | ICD-10-CM

## 2022-04-08 DIAGNOSIS — F331 Major depressive disorder, recurrent, moderate: Secondary | ICD-10-CM

## 2022-04-09 ENCOUNTER — Other Ambulatory Visit: Payer: Self-pay | Admitting: Psychiatry

## 2022-04-09 DIAGNOSIS — G4701 Insomnia due to medical condition: Secondary | ICD-10-CM

## 2022-04-20 ENCOUNTER — Other Ambulatory Visit: Payer: Self-pay | Admitting: Family

## 2022-04-25 ENCOUNTER — Other Ambulatory Visit: Payer: Self-pay | Admitting: Family

## 2022-05-03 ENCOUNTER — Ambulatory Visit (INDEPENDENT_AMBULATORY_CARE_PROVIDER_SITE_OTHER): Payer: PPO | Admitting: Psychiatry

## 2022-05-03 ENCOUNTER — Encounter: Payer: Self-pay | Admitting: Psychiatry

## 2022-05-03 VITALS — BP 106/69 | HR 98 | Temp 98.5°F | Wt 232.8 lb

## 2022-05-03 DIAGNOSIS — F418 Other specified anxiety disorders: Secondary | ICD-10-CM

## 2022-05-03 DIAGNOSIS — G4701 Insomnia due to medical condition: Secondary | ICD-10-CM

## 2022-05-03 DIAGNOSIS — F419 Anxiety disorder, unspecified: Secondary | ICD-10-CM | POA: Insufficient documentation

## 2022-05-03 DIAGNOSIS — F331 Major depressive disorder, recurrent, moderate: Secondary | ICD-10-CM

## 2022-05-03 DIAGNOSIS — Z79899 Other long term (current) drug therapy: Secondary | ICD-10-CM

## 2022-05-03 MED ORDER — LAMOTRIGINE 25 MG PO TABS: 25.0000 mg | ORAL_TABLET | Freq: Every day | ORAL | 0 refills | Status: DC

## 2022-05-03 MED ORDER — VENLAFAXINE HCL ER 37.5 MG PO CP24: 37.5000 mg | ORAL_CAPSULE | Freq: Every day | ORAL | 0 refills | Status: AC

## 2022-05-03 NOTE — Progress Notes (Unsigned)
[place] MD OP Progress Note  05/03/2022 2:50 PM Mandy Jones [facility]  MRN:  [id number]  Chief Complaint:  Chief Complaint  Patient presents with   Follow-up: 71 year old Caucasian female with history of MDD, insomnia, presented for medication management.   HPI: Mandy Jones is a 71 year old Caucasian female who is currently retired, lives in [place], has a history of MDD, multiple medical problems including bilateral carpal tunnel syndrome, fibromyalgia, chronic diastolic heart failure was evaluated in office today.  Patient today reports she is currently anxious, currently going through a lot of changes.  Depression continues to be the same, continues to struggle with low mood, low energy, sleep problems.  She is currently on the Lamictal however does not believe it has made a huge difference.  Patient denies any side effects to the Lamictal.  She has stopped taking the duloxetine, was able to wean herself off of it a week ago.  She had an appointment with rheumatology who was okay with that.  Started her on tizanidine for her fibromyalgia which she uses as needed.  Patient reports sleep is interrupted due to being on a diuretic.  She does use the Ambien at least 3 times a week and that helps.  Denies side effects.  Patient currently denies any suicidality, homicidality or perceptual disturbances.  Patient was able to sell her home, the closing was today.  She reports she is moving to a senior living community in [place] by the end of the month, looks forward to that.  Currently not in psychotherapy, could not complete her last visit with her therapist Ms.Hussami, due to connection problem.  She is looking for someone who can see her more frequently.  She is planning to establish care with a therapist once she moves to her new home.  Denies any other concerns today.  Visit Diagnosis:    ICD-10-CM   1. MDD (major depressive disorder), recurrent episode, moderate (HCC)  F33.1  venlafaxine XR (EFFEXOR XR) 37.5 MG 24 hr capsule    lamoTRIgine (LAMICTAL) 25 MG tablet    Platelet count    Sodium    2. Other specified anxiety disorders  F41.8    Generalized anxiety not occurring more days than not    3. Insomnia due to medical condition  G47.01 lamoTRIgine (LAMICTAL) 25 MG tablet   Mood, diuretics, OSA    4. High risk medication use  Z79.899 Platelet count    Sodium      Past Psychiatric History: Reviewed past psychiatric history from progress note on 12/21/2021.  Past Medical History:  Past Medical History:  Diagnosis Date   Arrhythmia    atrial fibrillation   Arthritis    CHF (congestive heart failure) (HCC)    Depression    Dyspnea    Dysrhythmia    Fibromyalgia 1992   GERD (gastroesophageal reflux disease)    Hepatitis    Hepatitis B 1985   Hyperlipidemia    Hypertension    Mitral valve prolapse    Pacemaker    Pacemaker 01/13/2021   Sleep apnea    CPAP   Tendon tear, ankle    rt foot   Thyroid dysfunction     Past Surgical History:  Procedure Laterality Date   ACHILLES TENDON SURGERY Right 04/06/2018   Procedure: ACHILLES TENDON REPAIR;  Surgeon: [name], DPM;  Location: [facility] ORS;  Service: Podiatry;  Laterality: Right;   CARDIOVERSION N/A 02/06/2018   Procedure: CARDIOVERSION;  Surgeon: Corey Skains, MD;  Location: [facility]  ORS;  Service: Cardiovascular;  Laterality: N/A;   CARDIOVERSION N/A 03/28/2018   Procedure: CARDIOVERSION;  Surgeon: Corey Skains, MD;  Location: [facility] ORS;  Service: Cardiovascular;  Laterality: N/A;   CARDIOVERSION N/A 05/09/2018   Procedure: CARDIOVERSION;  Surgeon: Corey Skains, MD;  Location: [facility] ORS;  Service: Cardiovascular;  Laterality: N/A;   CARDIOVERSION N/A 10/23/2019   Procedure: CARDIOVERSION;  Surgeon: Corey Skains, MD;  Location: [place] [facility]S;  Service: Cardiovascular;  Laterality: N/A;   CARDIOVERSION N/A 09/30/2020   Procedure: CARDIOVERSION;  Surgeon: Corey Skains, MD;   Location: [facility] ORS;  Service: Cardiovascular;  Laterality: N/A;   CARDIOVERSION N/A 11/19/2020   Procedure: CARDIOVERSION;  Surgeon: Corey Skains, MD;  Location: [facility] ORS;  Service: Cardiovascular;  Laterality: N/A;   CARDIOVERSION N/A 03/16/2021   Procedure: CARDIOVERSION;  Surgeon: Corey Skains, MD;  Location: [facility] ORS;  Service: Cardiovascular;  Laterality: N/A;   CHOLECYSTECTOMY     CONTINUOUS NERVE MONITORING N/A 08/03/2017   Procedure: LARYNGEAL NERVE MONITORING;  Surgeon: Carloyn Manner, MD;  Location: [facility] ORS;  Service: ENT;  Laterality: N/A;   EXTRACORPOREAL SHOCK WAVE LITHOTRIPSY Right 02/13/2020   Procedure: EXTRACORPOREAL SHOCK WAVE LITHOTRIPSY (ESWL);  Surgeon: Billey Co, MD;  Location: [facility] ORS;  Service: Urology;  Laterality: Right;   EYE SURGERY Bilateral    FOOT SURGERY Left 2000   JOINT REPLACEMENT     left knee   OSTECTOMY Right 04/06/2018   Procedure: OSTECTOMY-HAGLUNDS/RECTROCALCANEAL;  Surgeon: [name], DPM;  Location: [facility] ORS;  Service: Podiatry;  Laterality: Right;   SHOULDER ARTHROSCOPY Bilateral    SHOULDER ARTHROSCOPY Left    THYROIDECTOMY N/A 08/03/2017   Procedure: THYROIDECTOMY;  Surgeon: Carloyn Manner, MD;  Location: [facility] ORS;  Service: ENT;  Laterality: N/A;   TONSILLECTOMY     TUBAL LIGATION      Family Psychiatric History: Reviewed family psychiatric history from progress note on 12/21/2021.  Family History:  Family History  Problem Relation Age of Onset   Hypertension Mother    Alcohol abuse Father    Gastric cancer Father     Social History: Reviewed social history from progress note on 12/21/2021. Social History   Socioeconomic History   Marital status: Divorced    Spouse name: Not on file   Number of children: 0   Years of education: Not on file   Highest education level: Not on file  Occupational History   Occupation: retired     Comment: Astronomer   Tobacco Use   Smoking status: Former    Smokeless tobacco: Never   Tobacco comments:    quit 30 years ago  Scientific laboratory technician Use: Never used  Substance and Sexual Activity   Alcohol use: Yes    Comment: occassional glass of wine once a month   Drug use: Not Currently    Types: Marijuana   Sexual activity: Not Currently  Other Topics Concern   Not on file  Social History Narrative   Lives alone: 3 dogs and a Neurosurgeon    Social Determinants of Health   Financial Resource Strain: Not on file  Food Insecurity: Not on file  Transportation Needs: Not on file  Physical Activity: Not on file  Stress: Not on file  Social Connections: Not on file    Allergies:  Allergies  Allergen Reactions   Silver Other (See Comments)     Causes blisters. Causes blisters.   Tape Rash    Pads used for  ablation left a rash for a couple of days. Pads used for ablation left a rash for a couple of days.    Metabolic Disorder Labs: No results found for: "HGBA1C", "MPG" No results found for: "PROLACTIN" No results found for: "CHOL", "TRIG", "HDL", "CHOLHDL", "VLDL", "LDLCALC" Lab Results  Component Value Date   TSH 9.326 (H) 02/09/2020   TSH 4.115 01/30/2018    Therapeutic Level Labs: No results found for: "LITHIUM" No results found for: "VALPROATE" No results found for: "CBMZ"  Current Medications: Current Outpatient Medications  Medication Sig Dispense Refill   apixaban (ELIQUIS) 5 MG TABS tablet Take 5 mg by mouth every 12 (twelve) hours. 60 tablet 0   Ascorbic Acid (VITAMIN C) 1000 MG tablet Take 1,000 mg by mouth every evening.     B Complex-C (SUPER B COMPLEX PO) Take 1 tablet by mouth daily.     Boswellia-Glucosamine-Vit D (OSTEO BI-FLEX ONE PER DAY) TABS Take 1 tablet by mouth daily.     Calcium-Magnesium-Vitamin D (CALCIUM MAGNESIUM PO) Take 1 tablet by mouth daily.     clonazePAM (KLONOPIN) 0.5 MG tablet Take 0.5-1 tablets (0.25-0.5 mg total) by mouth daily as needed for anxiety. Please limit use 21 tablet 0   Coenzyme  Q10 (CO Q 10) 100 MG CAPS Take 100 mg by mouth daily.     famotidine (PEPCID) 20 MG tablet Take 20 mg by mouth 2 (two) times daily.     JARDIANCE 10 MG TABS tablet TAKE 1 TABLET BY MOUTH DAILY BEFORE BREAKFAST. 30 tablet 5   levothyroxine (SYNTHROID) 150 MCG tablet Take 150 mcg by mouth daily before breakfast.     melatonin 5 MG TABS Take 5 mg by mouth at bedtime as needed (sleep).     metoprolol succinate (TOPROL-XL) 25 MG 24 hr tablet Take 12.5 mg by mouth daily.     Multiple Vitamins-Minerals (PRESERVISION AREDS 2 PO) Take 1 tablet by mouth daily.     potassium chloride (KLOR-CON) 10 MEQ tablet Take 20 mEq by mouth daily.     Red Yeast Rice Extract 600 MG CAPS Take 600 mg by mouth every evening.     sacubitril-valsartan (ENTRESTO) 24-26 MG Take 1 tablet by mouth 2 (two) times daily. 60 tablet 5   spironolactone (ALDACTONE) 25 MG tablet Take 1 tablet (25 mg total) by mouth every other day. 90 tablet 3   tiZANidine (ZANAFLEX) 2 MG tablet Take by mouth.     torsemide (DEMADEX) 100 MG tablet TAKE 1/2 TABLET BY MOUTH DAILY 45 tablet 2   venlafaxine XR (EFFEXOR XR) 37.5 MG 24 hr capsule Take 1 capsule (37.5 mg total) by mouth daily with breakfast. 90 capsule 0   XIIDRA 5 % SOLN Apply 1 drop to eye 2 (two) times daily.     zolpidem (AMBIEN CR) 6.25 MG CR tablet TAKE 1 TABLET BY MOUTH AT BEDTIME AS NEEDED FOR SLEEP. 30 tablet 1   lamoTRIgine (LAMICTAL) 25 MG tablet Take 1 tablet (25 mg total) by mouth daily. 90 tablet 0   No current facility-administered medications for this visit.     Musculoskeletal: Strength & Muscle Tone: within normal limits Gait & Station: normal Patient leans: N/A  Psychiatric Specialty Exam: Review of Systems  Musculoskeletal:  Positive for myalgias.  Psychiatric/Behavioral:  Positive for dysphoric mood and sleep disturbance. The patient is nervous/anxious.   All other systems reviewed and are negative.   Blood pressure 106/69, pulse 98, temperature 98.5 F (36.9  C), temperature source Temporal, weight  232 lb 12.8 oz (105.6 kg).Body mass index is 42.58 kg/m.  General Appearance: Casual  Eye Contact:  Fair  Speech:  Clear and Coherent  Volume:  Normal  Mood:  Anxious and Depressed  Affect:  Congruent  Thought Process:  Goal Directed and Descriptions of Associations: Intact  Orientation:  Full (Time, Place, and Person)  Thought Content: Logical   Suicidal Thoughts:  No  Homicidal Thoughts:  No  Memory:  Immediate;   Fair Recent;   Fair Remote;   Fair  Judgement:  Fair  Insight:  Fair  Psychomotor Activity:  Normal  Concentration:  Concentration: Fair and Attention Span: Fair  Recall:  [employer] of Knowledge: Fair  Language: Fair  Akathisia:  No  Handed:  Right  AIMS (if indicated): not done  Assets:  Communication Skills Desire for Improvement Housing Social Support Transportation  ADL's:  Intact  Cognition: WNL  Sleep:   restless due to being on diuretic   Screenings: [place] Office Visit from 03/16/2022 in [place] Office Visit from 02/16/2022 in [place] Total Score 0 0      GAD-7    [place] Visit from 05/03/2022 in [place] Visit from 03/16/2022 in [place] Office Visit from 03/02/2022 in [place] Office Visit from 02/16/2022 in [place] Office Visit from 12/21/2021 in [place]  Total GAD-7 Score '9 11 7 9 3      '$ PHQ2-9    [place] Office Visit from 05/03/2022 in [place] Office Visit from 03/16/2022 in [place] Office Visit from 03/02/2022 in [place] Office Visit from 02/16/2022 in [place] Office Visit from 12/21/2021  in [place]  PHQ-2 Total Score '3 5 6 4 6  '$ PHQ-9 Total Score '14 14 16 15 14      '$ [place] Visit from 03/16/2022 in [place] Office Visit from 02/16/2022 in [place] Office Visit from 12/21/2021 in [place] No Risk No Risk No Risk        Assessment and Plan: Mandy Jones is a 71 year old Caucasian female, divorced, retired, lives in [place], has a history of depression, anxiety, fibromyalgia, congestive heart failure, macular degeneration, thyroid abnormality was evaluated in office today.  Patient is currently off of the Cymbalta continues to struggle with mood symptoms, multiple psychosocial stressors including upcoming relocation to a new home.  Patient will benefit from the following plan.  Plan MDD-unstable Discontinue Cymbalta, patient wean herself off of it. Start venlafaxine extended release 37.5 mg p.o. daily with breakfast.  Provided medication education. Continue Lamictal 25 mg p.o. daily. Continue CBT, patient to establish care with a new therapist when she moves to her new place.   Other specified anxiety disorder-unstable Patient will benefit from CBT.  Patient to establish care with therapist. Start venlafaxine extended release 37.5 mg p.o. daily Continue Klonopin 0.25-0.5 mg as needed for severe anxiety attacks.  Patient to limit use. Reviewed [place] PMP aware  Insomnia-improving Patient to work on sleep hygiene techniques.  Sleep problems due to being on diuretic.  High risk medication use-will order sodium level, platelet count.  Patient to get it done after a week to 2 weeks of starting the venlafaxine.  Provided lab slip.   Follow-up in clinic in 3 to 4 weeks or sooner if needed.   This note was generated in part or whole with voice recognition software. Voice recognition is usually quite accurate  but there are transcription errors that can and very often do occur. I apologize for any typographical errors that were not detected and corrected.      Ursula Alert, MD 05/03/2022, 2:50 PM

## 2022-05-03 NOTE — Patient Instructions (Signed)
Venlafaxine Extended-Release Capsules What is this medication? VENLAFAXINE (VEN la fax een) treats depression and anxiety. It increases the amount of serotonin and norepinephrine in the brain, hormones that help regulate mood. It belongs to a group of medications called SNRIs. This medicine may be used for other purposes; ask your health care provider or pharmacist if you have questions. COMMON BRAND NAME(S): Effexor XR What should I tell my care team before I take this medication? They need to know if you have any of these conditions: Bleeding disorders Glaucoma Heart disease High blood pressure High cholesterol Kidney disease Liver disease Low levels of sodium in the blood Mania or bipolar disorder Seizures Suicidal thoughts, plans, or attempt; a previous suicide attempt by you or a family Take medications that treat or prevent blood clots Thyroid disease An unusual or allergic reaction to venlafaxine, desvenlafaxine, other medications, foods, dyes, or preservatives Pregnant or trying to get pregnant Breast-feeding How should I use this medication? Take this medication by mouth with a full glass of water. Follow the directions on the prescription label. Do not cut, crush, or chew this medication. Take it with food. If needed, the capsule may be carefully opened and the entire contents sprinkled on a spoonful of cool applesauce. Swallow the applesauce/pellet mixture right away without chewing and follow with a glass of water to ensure complete swallowing of the pellets. Try to take your medication at about the same time each day. Do not take your medication more often than directed. Do not stop taking this medication suddenly except upon the advice of your care team. Stopping this medication too quickly may cause serious side effects or your condition may worsen. A special MedGuide will be given to you by the pharmacist with each prescription and refill. Be sure to read this information  carefully each time. Talk to your care team regarding the use of this medication in children. Special care may be needed. Overdosage: If you think you have taken too much of this medicine contact a poison control center or emergency room at once. NOTE: This medicine is only for you. Do not share this medicine with others. What if I miss a dose? If you miss a dose, take it as soon as you can. If it is almost time for your next dose, take only that dose. Do not take double or extra doses. What may interact with this medication? Do not take this medication with any of the following: Certain medications for fungal infections like fluconazole, itraconazole, ketoconazole, posaconazole, voriconazole Cisapride Desvenlafaxine Dronedarone Duloxetine Levomilnacipran Linezolid MAOIs like Carbex, Eldepryl, Marplan, Nardil, and Parnate Methylene blue (injected into a vein) Milnacipran Pimozide Thioridazine This medication may also interact with the following: Amphetamines Aspirin and aspirin-like medications Certain medications for depression, anxiety, or psychotic disturbances Certain medications for migraine headaches like almotriptan, eletriptan, frovatriptan, naratriptan, rizatriptan, sumatriptan, zolmitriptan Certain medications for sleep Certain medications that treat or prevent blood clots like dalteparin, enoxaparin, warfarin Cimetidine Clozapine Diuretics Fentanyl Furazolidone Indinavir Isoniazid Lithium Metoprolol NSAIDS, medications for pain and inflammation, like ibuprofen or naproxen Other medications that prolong the QT interval (cause an abnormal heart rhythm) like dofetilide, ziprasidone Procarbazine Rasagiline Supplements like St. John's wort, kava kava, valerian Tramadol Tryptophan This list may not describe all possible interactions. Give your health care provider a list of all the medicines, herbs, non-prescription drugs, or dietary supplements you use. Also tell them  if you smoke, drink alcohol, or use illegal drugs. Some items may interact with your medicine. What should   I watch for while using this medication? Tell your care team if your symptoms do not get better or if they get worse. Visit your care team for regular checks on your progress. Because it may take several weeks to see the full effects of this medication, it is important to continue your treatment as prescribed by your care team. Watch for new or worsening thoughts of suicide or depression. This includes sudden changes in mood, behaviors, or thoughts. These changes can happen at any time but are more common in the beginning of treatment or after a change in dose. Call your care team right away if you experience these thoughts or worsening depression. Manic episodes may happen in patients with bipolar disorder who take this medication. Watch for changes in feelings or behaviors such as feeling anxious, nervous, agitated, panicky, irritable, hostile, aggressive, impulsive, severely restless, overly excited and hyperactive, or trouble sleeping. These changes can happen at any time but are more common in the beginning of treatment or after a change in dose. Call your care team right away if you notice any of these symptoms. This medication can cause an increase in blood pressure. Check with your care team for instructions on monitoring your blood pressure while taking this medication. You may get drowsy or dizzy. Do not drive, use machinery, or do anything that needs mental alertness until you know how this medication affects you. Do not stand or sit up quickly, especially if you are an older patient. This reduces the risk of dizzy or fainting spells. Do not drink alcohol while taking this medication. Drinking alcohol may alter the effects of your medication. Serious side effects may occur. Your mouth may get dry. Chewing sugarless gum, sucking hard candy and drinking plenty of water will help. Contact your  care team if the problem does not go away or is severe. What side effects may I notice from receiving this medication? Side effects that you should report to your care team as soon as possible: Allergic reactions--skin rash, itching, hives, swelling of the face, lips, tongue, or throat Bleeding--bloody or black, tar-like stools, red or dark brown urine, vomiting blood or brown material that looks like coffee grounds, small, red or purple spots on skin, unusual bleeding or bruising Heart rhythm changes--fast or irregular heartbeat, dizziness, feeling faint or lightheaded, chest pain, trouble breathing Increase in blood pressure Loss of appetite with weight loss Low sodium level--muscle weakness, fatigue, dizziness, headache, confusion Serotonin syndrome--irritability, confusion, fast or irregular heartbeat, muscle stiffness, twitching muscles, sweating, high fever, seizures, chills, vomiting, diarrhea Sudden eye pain or change in vision such as blurry vision, seeing halos around lights, vision loss Thoughts of suicide or self-harm, worsening mood, feelings of depression Side effects that usually do not require medical attention (report to your care team if they continue or are bothersome): Anxiety, nervousness Change in sex drive or performance Dizziness Dry mouth Excessive sweating Nausea Tremors or shaking Trouble sleeping This list may not describe all possible side effects. Call your doctor for medical advice about side effects. You may report side effects to FDA at 1-800-FDA-1088. Where should I keep my medication? Keep out of the reach of children and pets. Store at a controlled temperature between 20 and 25 degrees C (68 degrees and 77 degrees F), in a dry place. Throw away any unused medication after the expiration date. NOTE: This sheet is a summary. It may not cover all possible information. If you have questions about this medicine, talk to your doctor,   pharmacist, or health care  provider.  2023 Elsevier/Gold Standard (2020-08-05 00:00:00)  

## 2022-05-05 ENCOUNTER — Other Ambulatory Visit: Payer: Self-pay | Admitting: Psychiatry

## 2022-05-05 DIAGNOSIS — F419 Anxiety disorder, unspecified: Secondary | ICD-10-CM

## 2022-05-09 ENCOUNTER — Other Ambulatory Visit: Payer: Self-pay

## 2022-05-09 ENCOUNTER — Ambulatory Visit
Admission: RE | Admit: 2022-05-09 | Discharge: 2022-05-09 | Disposition: A | Discharge status: Home / Self Care | Payer: HMO | Source: Ambulatory Visit | Attending: Urology | Admitting: Urology

## 2022-05-09 DIAGNOSIS — N2889 Other specified disorders of kidney and ureter: Secondary | ICD-10-CM | POA: Diagnosis present

## 2022-05-10 ENCOUNTER — Ambulatory Visit: Payer: Medicare HMO | Admitting: Urology

## 2022-05-12 ENCOUNTER — Telehealth: Payer: Self-pay

## 2022-05-12 DIAGNOSIS — N2889 Other specified disorders of kidney and ureter: Secondary | ICD-10-CM

## 2022-05-12 NOTE — Telephone Encounter (Signed)
Called pt informed her of the information below. Pt voiced understanding. Appt scheduled however pt states she is moving out of state and will need to transfer her care.

## 2022-05-12 NOTE — Telephone Encounter (Signed)
-----   Message from Billey Co, MD sent at 05/12/2022  8:49 AM EDT ----- Good news, renal ultrasound overall stable.  There is a new very small 1 cm lesion in the right kidney, and this can be safely monitored.  I would not recommend MRI at this time, and would recommend follow-up in 6 months with a repeat ultrasound.    Nickolas Madrid, MD 05/12/2022

## 2022-05-13 ENCOUNTER — Other Ambulatory Visit: Payer: Self-pay | Admitting: Psychiatry

## 2022-05-13 DIAGNOSIS — F331 Major depressive disorder, recurrent, moderate: Secondary | ICD-10-CM

## 2022-05-13 DIAGNOSIS — F419 Anxiety disorder, unspecified: Secondary | ICD-10-CM

## 2022-05-13 IMAGING — MR MR CERVICAL SPINE W/O CM
5 series · 37 of 48 positions shown · non-contrast
Comparison: None.

CLINICAL DATA: Unsteadiness

EXAM:
MRI CERVICAL SPINE WITHOUT CONTRAST
TECHNIQUE: Multiplanar, multisequence MR imaging of the cervical spine was
performed. No intravenous contrast was administered.

[Series 5: T2 · sagittal · 3.0mm · 0.69mm/px · 6 of 15 slices shown (1 of 2)]
[im 1/15]
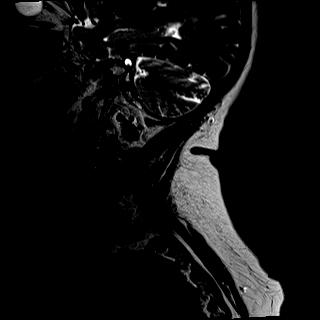
[im 3/15]
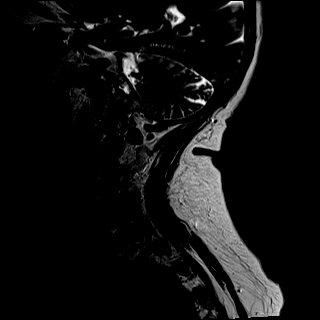
[im 6/15]
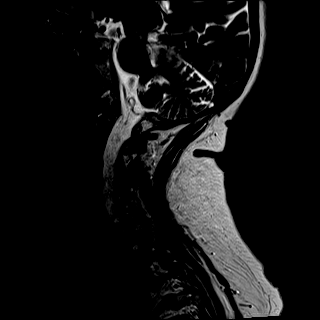
[im 9/15]
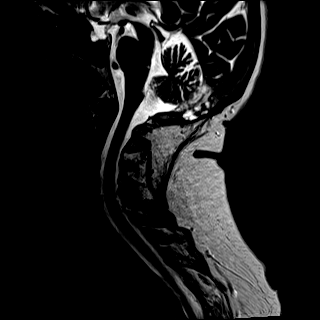
[im 12/15]
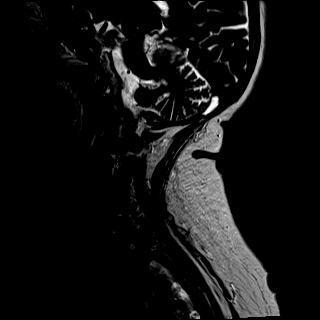
[im 15/15]
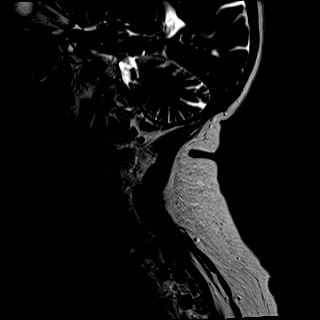

[Series 6: T1 · sagittal · 3.0mm · 0.69mm/px · 6 of 15 slices shown]
[im 1/15]
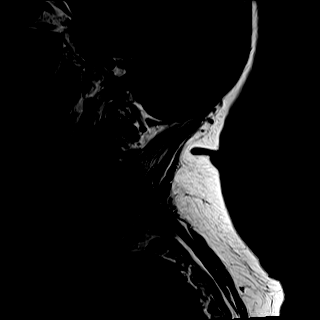
[im 3/15]
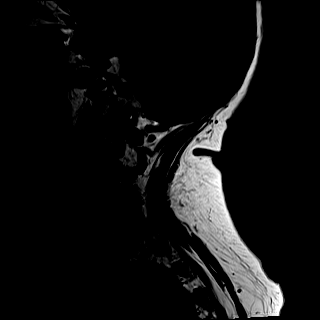
[im 6/15]
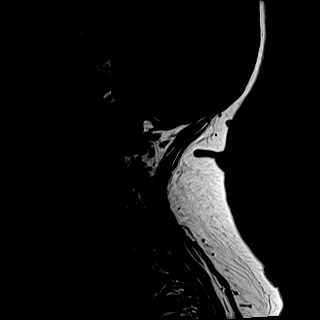
[im 9/15]
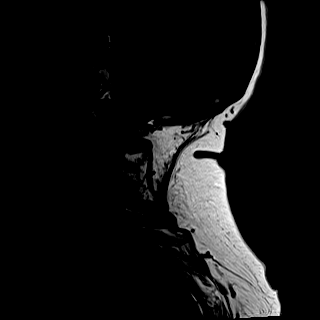
[im 12/15]
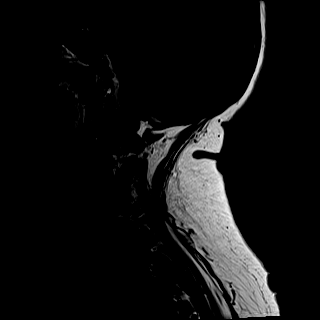
[im 15/15]
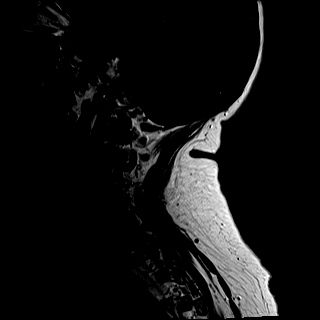

[Series 7: STIR · sagittal · 3.0mm · 0.86mm/px · 6 of 15 slices shown]
[im 1/15]
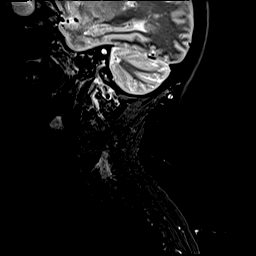
[im 3/15]
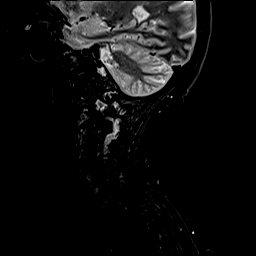
[im 6/15]
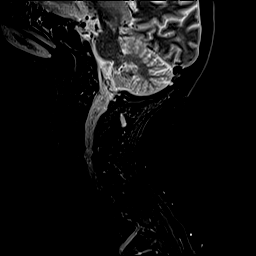
[im 9/15]
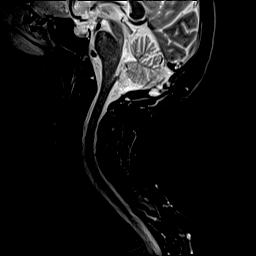
[im 12/15]
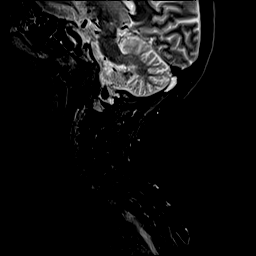
[im 15/15]
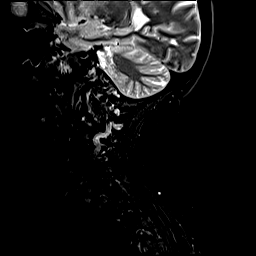

[Series 8: T2 · axial · 3.0mm · 0.66mm/px · z∈[-125,-4]mm · 11 of 40 slices shown (2 of 2)]
[im 1/40]
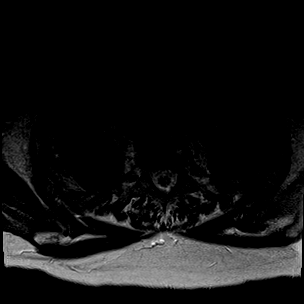
[im 3/40]
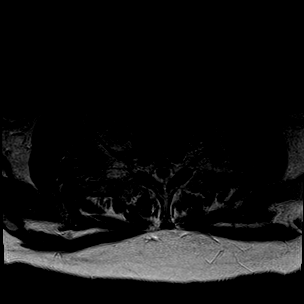
[im 6/40]
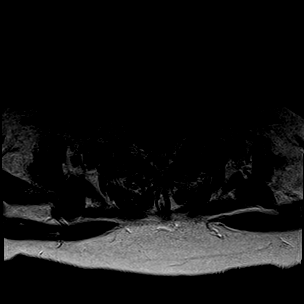
[im 9/40]
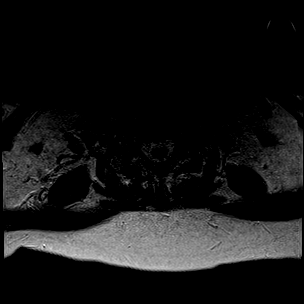
[im 12/40]
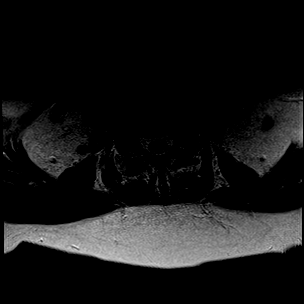
[im 17/40]
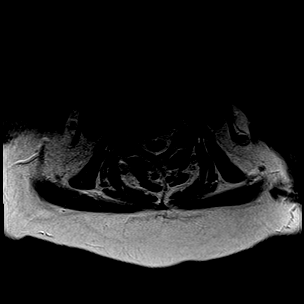
[im 20/40]
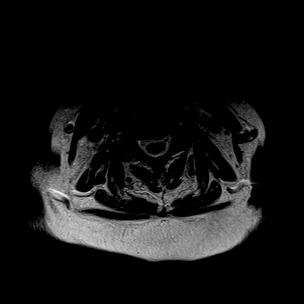
[im 23/40]
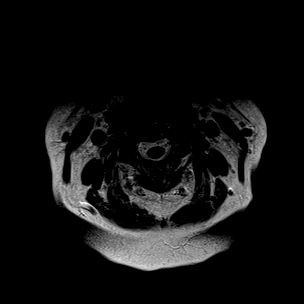
[im 28/40]
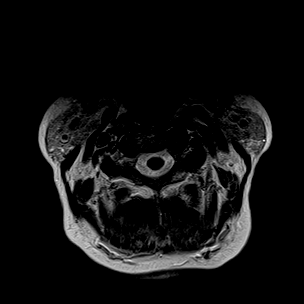
[im 34/40]
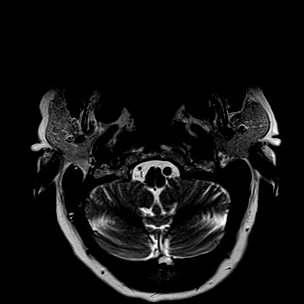
[im 40/40]
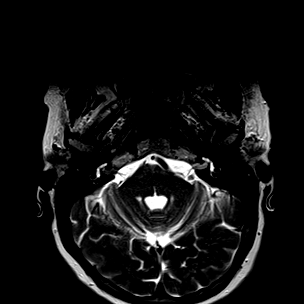

[Series 9: GRE · axial · 3.0mm · 0.39mm/px · z∈[-125,-4]mm · 8 of 40 slices shown]
[im 1/40]
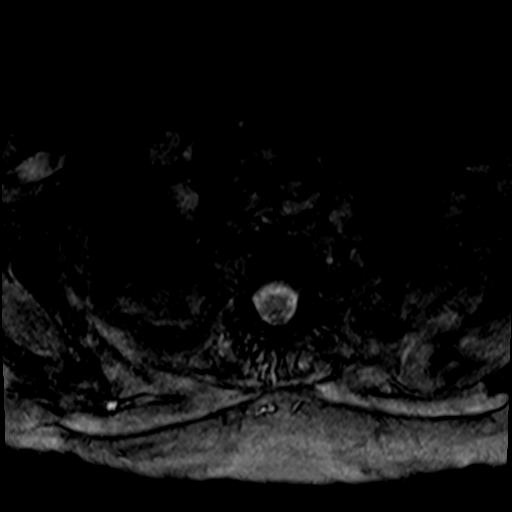
[im 6/40]
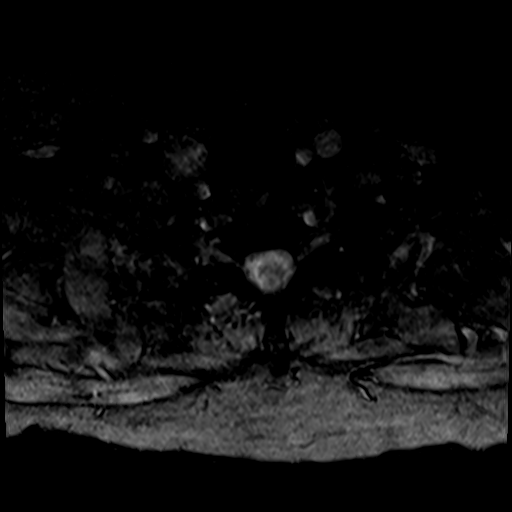
[im 12/40]
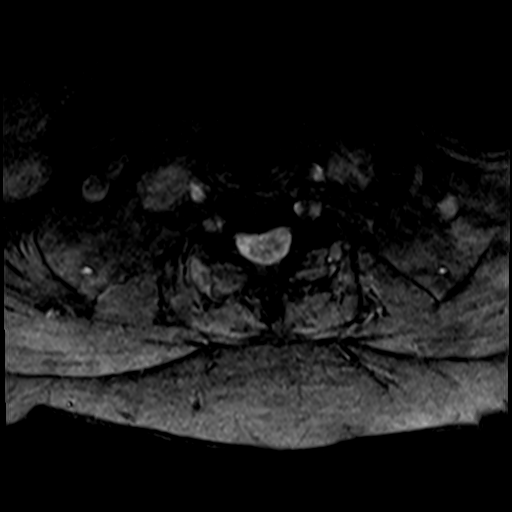
[im 17/40]
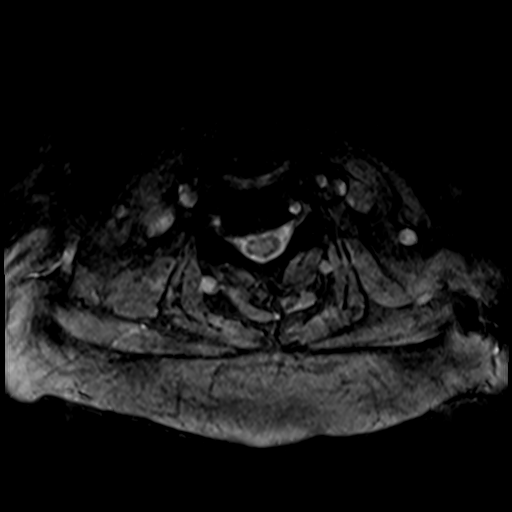
[im 23/40]
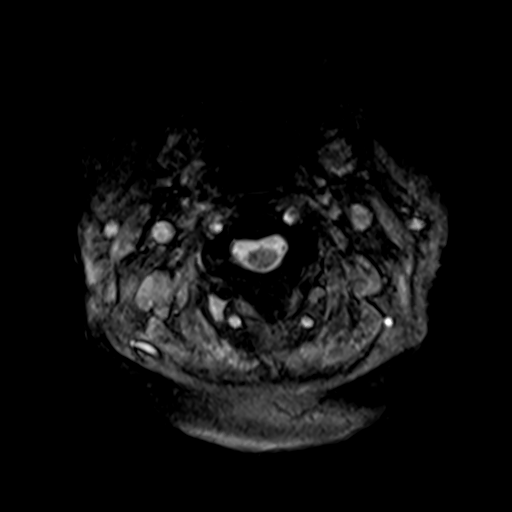
[im 28/40]
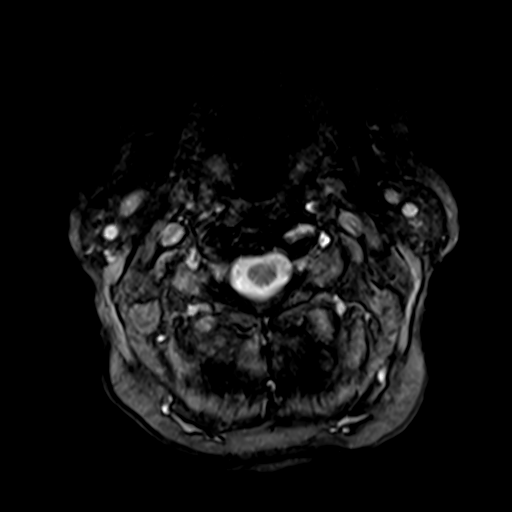
[im 34/40]
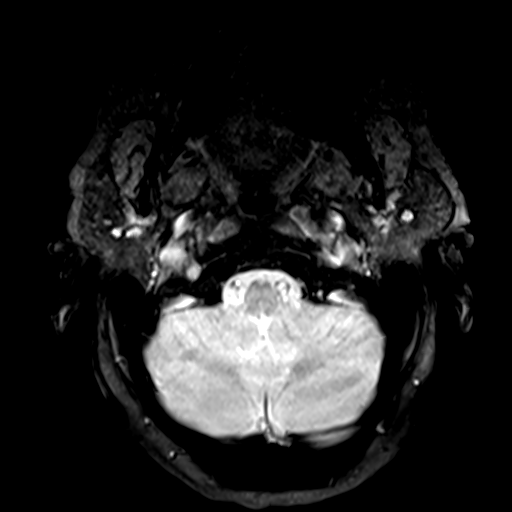
[im 40/40]
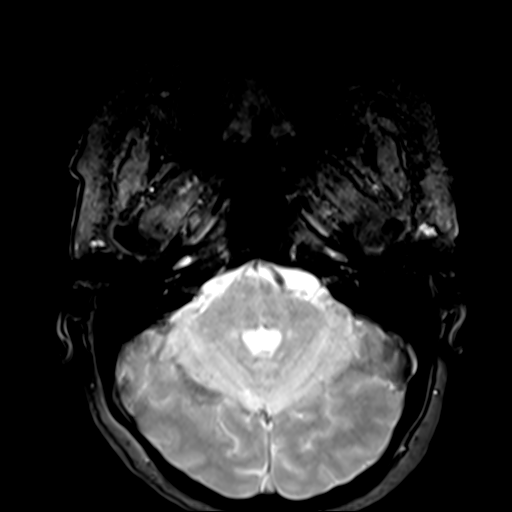

[37 of 48 positions shown; findings below may reference images not displayed]

FINDINGS: Evaluation is somewhat limited by motion artifact.

Alignment: Exaggeration of the normal cervical lordosis. No
listhesis.

Vertebrae: No acute fracture or suspicious osseous lesion.

Cord: Normal signal and morphology.

Posterior Fossa, vertebral arteries, paraspinal tissues: Partial
empty sella. Normal craniocervical junction. Normal vertebral artery
flow voids.

Disc levels:

C2-C3: No significant disc bulge. No spinal canal stenosis or
neuroforaminal narrowing.

C3-C4: Significant disc bulge. Facet arthropathy and uncovertebral
hypertrophy. Mild left and moderate right neural foraminal
narrowing.

C4-C5: Right eccentric disc osteophyte complex.
Right-greater-than-left uncovertebral hypertrophy. No spinal canal
stenosis. Moderate to severe right neural foraminal narrowing.

C5-C6: Mild disc bulge. Right-greater-than-left uncovertebral
hypertrophy. Facet arthropathy. No spinal canal stenosis. Moderate
right neural foraminal narrowing.

C6-C7: No significant disc bulge. No spinal canal stenosis or
neuroforaminal narrowing.

C7-T1: No significant disc bulge. No spinal canal stenosis or
neuroforaminal narrowing.
IMPRESSION: 1. C4-C5 moderate to severe right neural foraminal narrowing.
2. C3-C4 moderate right and mild left foraminal narrowing.
3. C5-C6 moderate right neural foraminal narrowing.
4. No spinal canal stenosis.

## 2022-05-13 NOTE — Progress Notes (Unsigned)
Patient ID: Mandy Jones, female    DOB: 10/08/1950, 71 y.o.   MRN: [id number]  HPI  Ms Mandy Jones is a 71 y/o female with a history of atrial fibrillation, HTN, thyroid disease, GERD, sleep apnea, depression, fibromyalgia, hyperlipidemia, previous tobacco use and chronic heart failure.   Echo report from 12/09/21 reviewed and showed an EF of 35% along with mild LVH. Echo report from 10/15/20 reviewed and showed an EF of 40%. Echo report from 02/10/20 reviewed and showed an EF of 55-60% along with mild LAE.   Has not been admitted or been in the ED in the last 6 months  She presents today for a follow-up visit with a chief complaint of minimal fatigue upon moderate exertion. Describes this as chronic in nature. She has associated shortness of breath, abdominal distention, left shoulder pain, depression and difficulty sleeping along with this. She denies any palpitations, pedal edema, chest pain, cough, dizziness or weight gain.   Is being treated for her mental health but then developed jitteriness so med is being down titrated. Psychiatrist requests EKG to be done due to possible QT prolongation with mental health medications.   She says that [place]'s considering [place] community or to [place] to an adjoined townhouse where she has many friends. She is unsure if she can tolerate the [place] weather. Both places have a gym, clubhouse etc  Past Medical History:  Diagnosis Date   Arrhythmia    atrial fibrillation   Arthritis    CHF (congestive heart failure) ([place])    Depression    Dyspnea    Dysrhythmia    Fibromyalgia 1992   GERD (gastroesophageal reflux disease)    Hepatitis    Hepatitis B 1985   Hyperlipidemia    Hypertension    Mitral valve prolapse    Pacemaker    Pacemaker 01/13/2021   Sleep apnea    CPAP   Tendon tear, ankle    rt foot   Thyroid dysfunction    Past Surgical History:  Procedure Laterality Date   ACHILLES  TENDON SURGERY Right 04/06/2018   Procedure: ACHILLES TENDON REPAIR;  Surgeon: [name], DPM;  Location: [facility];  Service: Podiatry;  Laterality: Right;   CARDIOVERSION N/A 02/06/2018   Procedure: CARDIOVERSION;  Surgeon: Corey Skains, MD;  Location: [facility];  Service: Cardiovascular;  Laterality: N/A;   CARDIOVERSION N/A 03/28/2018   Procedure: CARDIOVERSION;  Surgeon: Corey Skains, MD;  Location: [facility];  Service: Cardiovascular;  Laterality: N/A;   CARDIOVERSION N/A 05/09/2018   Procedure: CARDIOVERSION;  Surgeon: Corey Skains, MD;  Location: [facility];  Service: Cardiovascular;  Laterality: N/A;   CARDIOVERSION N/A 10/23/2019   Procedure: CARDIOVERSION;  Surgeon: Corey Skains, MD;  Location: [facility];  Service: Cardiovascular;  Laterality: N/A;   CARDIOVERSION N/A 09/30/2020   Procedure: CARDIOVERSION;  Surgeon: Corey Skains, MD;  Location: [facility];  Service: Cardiovascular;  Laterality: N/A;   CARDIOVERSION N/A 11/19/2020   Procedure: CARDIOVERSION;  Surgeon: Corey Skains, MD;  Location: [facility];  Service: Cardiovascular;  Laterality: N/A;   CARDIOVERSION N/A 03/16/2021   Procedure: CARDIOVERSION;  Surgeon: Corey Skains, MD;  Location: [facility];  Service: Cardiovascular;  Laterality: N/A;   CHOLECYSTECTOMY     CONTINUOUS NERVE MONITORING N/A 08/03/2017   Procedure: LARYNGEAL NERVE MONITORING;  Surgeon: Carloyn Manner, MD;  Location: [facility];  Service: ENT;  Laterality: N/A;   EXTRACORPOREAL  SHOCK WAVE LITHOTRIPSY Right 02/13/2020   Procedure: EXTRACORPOREAL SHOCK WAVE LITHOTRIPSY (ESWL);  Surgeon: Billey Co, MD;  Location: [facility];  Service: Urology;  Laterality: Right;   EYE SURGERY Bilateral    FOOT SURGERY Left 2000   JOINT REPLACEMENT     left knee   OSTECTOMY Right 04/06/2018   Procedure: OSTECTOMY-HAGLUNDS/RECTROCALCANEAL;  Surgeon: [name], DPM;  Location: [facility];  Service: Podiatry;  Laterality: Right;   SHOULDER  ARTHROSCOPY Bilateral    SHOULDER ARTHROSCOPY Left    THYROIDECTOMY N/A 08/03/2017   Procedure: THYROIDECTOMY;  Surgeon: Carloyn Manner, MD;  Location: [facility];  Service: ENT;  Laterality: N/A;   TONSILLECTOMY     TUBAL LIGATION     Family History  Problem Relation Age of Onset   Hypertension Mother    Alcohol abuse Father    Gastric cancer Father    Social History   Tobacco Use   Smoking status: Former   Smokeless tobacco: Never   Tobacco comments:    quit 30 years ago  Substance Use Topics   Alcohol use: Yes    Comment: occassional glass of wine once a month   Allergies  Allergen Reactions   Silver Other (See Comments)     Causes blisters. Causes blisters.   Tape Rash    Pads used for ablation left a rash for a couple of days. Pads used for ablation left a rash for a couple of days.   Prior to Admission medications   Medication Sig Start Date End Date Taking? Authorizing Provider  apixaban (ELIQUIS) 5 MG TABS tablet Take 5 mg by mouth every 12 (twelve) hours. 02/14/20  Yes Val Riles, MD  Ascorbic Acid (VITAMIN C) 1000 MG tablet Take 1,000 mg by mouth every evening.   Yes [provider]  B Complex-C (SUPER B COMPLEX PO) Take 1 tablet by mouth daily.   Yes [provider]  Boswellia-Glucosamine-Vit D (OSTEO BI-FLEX ONE PER DAY) TABS Take 1 tablet by mouth daily.   Yes [provider]  Calcium-Magnesium-Vitamin D (CALCIUM MAGNESIUM PO) Take 1 tablet by mouth daily.   Yes [provider]  clonazePAM (KLONOPIN) 0.5 MG tablet Take 0.5-1 tablets (0.25-0.5 mg total) by mouth daily as needed for anxiety. Please limit use 02/16/22  Yes Eappen, Ria Clock, MD  Coenzyme Q10 (CO Q 10) 100 MG CAPS Take 100 mg by mouth daily.   Yes [provider]  DULoxetine (CYMBALTA) 20 MG capsule Take 1 capsule (20 mg total) by mouth daily. Take along with 30 mg daily 02/16/22  Yes Eappen, Ria Clock, MD  DULoxetine (CYMBALTA) 30 MG capsule Take 1 capsule  (30 mg total) by mouth daily. Take along with 20 mg daily 02/16/22  Yes Eappen, Ria Clock, MD  empagliflozin (JARDIANCE) 10 MG TABS tablet Take 1 tablet by mouth daily before breakfast. 09/12/21  Yes [provider]  famotidine (PEPCID) 20 MG tablet Take 20 mg by mouth 2 (two) times daily. 10/09/19  Yes [provider]  levothyroxine (SYNTHROID) 150 MCG tablet Take 150 mcg by mouth daily before breakfast. 10/15/19  Yes [provider]  melatonin 5 MG TABS Take 5 mg by mouth at bedtime as needed (sleep).   Yes [provider]  metoprolol succinate (TOPROL-XL) 25 MG 24 hr tablet Take 12.5 mg by mouth daily. 11/24/21 11/24/22 Yes [provider]  Multiple Vitamins-Minerals (PRESERVISION AREDS 2 PO) Take 1 tablet by mouth daily.   Yes [provider]  potassium chloride (KLOR-CON) 10  MEQ tablet Take 20 mEq by mouth daily. 09/12/20  Yes [provider]  Red Yeast Rice Extract 600 MG CAPS Take 600 mg by mouth every evening.   Yes [provider]  sacubitril-valsartan (ENTRESTO) 24-26 MG Take 1 tablet by mouth 2 (two) times daily. 03/03/21  Yes Darylene Price A, FNP  spironolactone (ALDACTONE) 25 MG tablet Take 1 tablet (25 mg total) by mouth every other day. 11/30/21 03/02/22 Yes Hackney, Otila Kluver A, FNP  torsemide (DEMADEX) 100 MG tablet Take 0.5 tablets (50 mg total) by mouth daily. 07/28/21  Yes Hackney, Tina A, FNP  XIIDRA 5 % SOLN Apply 1 drop to eye 2 (two) times daily. 05/25/21  Yes [provider]  zolpidem (AMBIEN CR) 6.25 MG CR tablet Take 1 tablet (6.25 mg total) by mouth at bedtime as needed for sleep. 02/16/22  Yes Ursula Alert, MD    Review of Systems  Constitutional:  Positive for fatigue. Negative for appetite change.  HENT:  Negative for congestion, postnasal drip and sore throat.   Eyes: Negative.   Respiratory:  Positive for shortness of breath (minimal). Negative for cough and chest tightness.   Cardiovascular:  Negative  for chest pain, palpitations and leg swelling.  Gastrointestinal:  Positive for abdominal distention. Negative for abdominal pain.  Endocrine: Negative.   Genitourinary: Negative.   Musculoskeletal:  Positive for arthralgias (left shoulder). Negative for back pain and myalgias.  Skin:  Negative for rash.       Itching lower abdomen  Allergic/Immunologic: Negative.   Neurological:  Negative for dizziness and light-headedness.  Hematological:  Negative for adenopathy. Does not bruise/bleed easily.  Psychiatric/Behavioral:  Positive for dysphoric mood (no better) and sleep disturbance (interrupted sleep; sleeping on 1-2 pillows; wearing CPAP most nights). Negative for suicidal ideas. The patient is not nervous/anxious.    There were no vitals filed for this visit.  Wt Readings from Last 3 Encounters:  03/02/22 227 lb 4 oz (103.1 kg)  11/30/21 230 lb (104.3 kg)  10/07/21 230 lb 3 oz (104.4 kg)   Lab Results  Component Value Date   CREATININE 0.75 10/07/2021   CREATININE 0.62 09/07/2021   CREATININE 0.74 04/21/2021    Physical Exam Vitals and nursing note reviewed.  Constitutional:      Appearance: Normal appearance.  HENT:     Head: Normocephalic and atraumatic.  Cardiovascular:     Rate and Rhythm: Normal rate. Rhythm irregular.  Pulmonary:     Effort: Pulmonary effort is normal. No respiratory distress.     Breath sounds: No wheezing or rales.  Abdominal:     General: There is no distension.     Palpations: Abdomen is soft.  Musculoskeletal:        General: No tenderness.     Cervical back: Normal range of motion and neck supple.     Right lower leg: No edema.     Left lower leg: No edema.  Skin:    General: Skin is warm and dry.  Neurological:     General: No focal deficit present.     Mental Status: She is alert and oriented to person, place, and time.  Psychiatric:        Mood and Affect: Mood normal.        Behavior: Behavior normal.        Thought Content:  Thought content normal.    Assessment & Plan:  1: Chronic heart failure with reduced ejection fraction- - NYHA class II - euvolemic today -  weighing daily; reminded to call for an overnight weight gain of >2 pounds or a weekly weight gain of >5 pounds - weight down 3 pounds from last visit here 3 months ago - not adding salt except to pasta water - saw cardiology Petra Kuba) 01/17/22 - on GDMT of entresto, jardiance, metoprolol & spironolactone - participating in pulmonary rehab - saw pulmonology Raul Del) 01/25/22 - BNP 02/22/21 was 108.8 - PharmD reconciled medications with the patient  2: HTN- - BP looks good (103/65) - previously saw PCP Quentin Cornwall) will now see [facility] on 05/17/22 due to insurance change - BMP 10/07/21 reviewed and showed sodium 137, potassium 4.0, creatinine 0.75 and GFR >60  3: Atrial fibrillation- - previous ablation & cardioversion done  - on apixaban & metoprolol succinate - saw EP Marcello Moores) 04/09/21  4: Depression- - admits to worsening depression but no suicidal thoughts/ plans - has 2 large dogs that she wants to live for - saw psychiatry 02/16/22 - is looking to move at the end of the year to [place] the [place] or to [place]  5: Risk of prolonged QT- - EKG done in office today per psychiatry request due to risk of QT prolongation with medications   Patient did not bring her medications nor a list. Each medication was verbally reviewed with the patient and she was encouraged to bring the bottles to every visit to confirm accuracy of list.  Return in 4 months, sooner if needed.

## 2022-05-16 ENCOUNTER — Ambulatory Visit: Payer: HMO | Attending: Family | Admitting: Family

## 2022-05-16 ENCOUNTER — Encounter
Admission: RE | Admit: 2022-05-16 | Discharge: 2022-05-16 | Disposition: A | Discharge status: Home / Self Care | Payer: HMO | Source: Ambulatory Visit | Attending: Family | Admitting: Family

## 2022-05-16 ENCOUNTER — Encounter: Payer: Self-pay | Admitting: Family

## 2022-05-16 VITALS — BP 105/67 | HR 73 | Resp 20 | Ht 63.0 in | Wt 235.5 lb

## 2022-05-16 DIAGNOSIS — F32A Depression, unspecified: Secondary | ICD-10-CM | POA: Insufficient documentation

## 2022-05-16 DIAGNOSIS — G4730 Sleep apnea, unspecified: Secondary | ICD-10-CM | POA: Diagnosis not present

## 2022-05-16 DIAGNOSIS — M797 Fibromyalgia: Secondary | ICD-10-CM | POA: Insufficient documentation

## 2022-05-16 DIAGNOSIS — F329 Major depressive disorder, single episode, unspecified: Secondary | ICD-10-CM

## 2022-05-16 DIAGNOSIS — R140 Abdominal distension (gaseous): Secondary | ICD-10-CM | POA: Insufficient documentation

## 2022-05-16 DIAGNOSIS — I4891 Unspecified atrial fibrillation: Secondary | ICD-10-CM | POA: Diagnosis not present

## 2022-05-16 DIAGNOSIS — E079 Disorder of thyroid, unspecified: Secondary | ICD-10-CM | POA: Insufficient documentation

## 2022-05-16 DIAGNOSIS — Z9049 Acquired absence of other specified parts of digestive tract: Secondary | ICD-10-CM | POA: Diagnosis not present

## 2022-05-16 DIAGNOSIS — Z7901 Long term (current) use of anticoagulants: Secondary | ICD-10-CM | POA: Diagnosis not present

## 2022-05-16 DIAGNOSIS — I10 Essential (primary) hypertension: Secondary | ICD-10-CM

## 2022-05-16 DIAGNOSIS — I5022 Chronic systolic (congestive) heart failure: Secondary | ICD-10-CM | POA: Diagnosis not present

## 2022-05-16 DIAGNOSIS — K219 Gastro-esophageal reflux disease without esophagitis: Secondary | ICD-10-CM | POA: Insufficient documentation

## 2022-05-16 DIAGNOSIS — I110 Hypertensive heart disease with heart failure: Secondary | ICD-10-CM | POA: Insufficient documentation

## 2022-05-16 DIAGNOSIS — R0602 Shortness of breath: Secondary | ICD-10-CM | POA: Insufficient documentation

## 2022-05-16 DIAGNOSIS — M25512 Pain in left shoulder: Secondary | ICD-10-CM | POA: Insufficient documentation

## 2022-05-16 DIAGNOSIS — T148XXA Other injury of unspecified body region, initial encounter: Secondary | ICD-10-CM

## 2022-05-16 DIAGNOSIS — E785 Hyperlipidemia, unspecified: Secondary | ICD-10-CM | POA: Insufficient documentation

## 2022-05-16 DIAGNOSIS — L089 Local infection of the skin and subcutaneous tissue, unspecified: Secondary | ICD-10-CM

## 2022-05-16 DIAGNOSIS — I480 Paroxysmal atrial fibrillation: Secondary | ICD-10-CM

## 2022-05-16 LAB — BASIC METABOLIC PANEL
Anion gap: 8 (ref 5–15)
BUN: 26 mg/dL — ABNORMAL HIGH (ref 8–23)
CO2: 28 mmol/L (ref 22–32)
Calcium: 9.6 mg/dL (ref 8.9–10.3)
Chloride: 107 mmol/L (ref 98–111)
Creatinine, Ser: 0.6 mg/dL (ref 0.44–1.00)
GFR, Estimated: >60 mL/min (ref >60)
Glucose, Bld: 97 mg/dL (ref 70–99)
Potassium: 4.6 mmol/L (ref 3.5–5.1)
Sodium: 143 mmol/L (ref 135–145)

## 2022-05-16 NOTE — Patient Instructions (Addendum)
Continue weighing daily and call for an overnight weight gain of 3 pounds or more or a weekly weight gain of more than 5 pounds.   Has been a pleasure taking care of you! Best of luck in [place] and drive safe!

## 2022-05-17 ENCOUNTER — Ambulatory Visit (INDEPENDENT_AMBULATORY_CARE_PROVIDER_SITE_OTHER): Payer: HMO | Admitting: Internal Medicine

## 2022-05-17 ENCOUNTER — Encounter: Payer: Self-pay | Admitting: Internal Medicine

## 2022-05-17 VITALS — BP 122/84 | HR 72 | Temp 96.9°F | Ht 63.0 in | Wt 235.0 lb

## 2022-05-17 DIAGNOSIS — Z23 Encounter for immunization: Secondary | ICD-10-CM | POA: Diagnosis not present

## 2022-05-17 DIAGNOSIS — I2510 Atherosclerotic heart disease of native coronary artery without angina pectoris: Secondary | ICD-10-CM | POA: Diagnosis not present

## 2022-05-17 DIAGNOSIS — Z9989 Dependence on other enabling machines and devices: Secondary | ICD-10-CM

## 2022-05-17 DIAGNOSIS — G4733 Obstructive sleep apnea (adult) (pediatric): Secondary | ICD-10-CM

## 2022-05-17 DIAGNOSIS — S81801A Unspecified open wound, right lower leg, initial encounter: Secondary | ICD-10-CM

## 2022-05-17 DIAGNOSIS — I5032 Chronic diastolic (congestive) heart failure: Secondary | ICD-10-CM | POA: Diagnosis not present

## 2022-05-17 DIAGNOSIS — F331 Major depressive disorder, recurrent, moderate: Secondary | ICD-10-CM

## 2022-05-17 DIAGNOSIS — M1711 Unilateral primary osteoarthritis, right knee: Secondary | ICD-10-CM

## 2022-05-17 DIAGNOSIS — I6523 Occlusion and stenosis of bilateral carotid arteries: Secondary | ICD-10-CM | POA: Diagnosis not present

## 2022-05-17 DIAGNOSIS — G4701 Insomnia due to medical condition: Secondary | ICD-10-CM

## 2022-05-17 DIAGNOSIS — E890 Postprocedural hypothyroidism: Secondary | ICD-10-CM

## 2022-05-17 DIAGNOSIS — K219 Gastro-esophageal reflux disease without esophagitis: Secondary | ICD-10-CM

## 2022-05-17 DIAGNOSIS — M797 Fibromyalgia: Secondary | ICD-10-CM

## 2022-05-17 DIAGNOSIS — A601 Herpesviral infection of perianal skin and rectum: Secondary | ICD-10-CM

## 2022-05-17 DIAGNOSIS — I4891 Unspecified atrial fibrillation: Secondary | ICD-10-CM | POA: Diagnosis not present

## 2022-05-17 DIAGNOSIS — I10 Essential (primary) hypertension: Secondary | ICD-10-CM

## 2022-05-17 DIAGNOSIS — E782 Mixed hyperlipidemia: Secondary | ICD-10-CM

## 2022-05-17 DIAGNOSIS — F418 Other specified anxiety disorders: Secondary | ICD-10-CM

## 2022-05-17 DIAGNOSIS — A6000 Herpesviral infection of urogenital system, unspecified: Secondary | ICD-10-CM | POA: Insufficient documentation

## 2022-05-17 MED ORDER — VALACYCLOVIR HCL 1 G PO TABS: ORAL_TABLET | ORAL | 1 refills | Status: AC

## 2022-05-17 MED ORDER — CEPHALEXIN 500 MG PO CAPS: 500.0000 mg | ORAL_CAPSULE | Freq: Three times a day (TID) | ORAL | 0 refills | Status: AC

## 2022-05-17 NOTE — Assessment & Plan Note (Signed)
Encourage diet and exercise for weight loss 

## 2022-05-17 NOTE — Assessment & Plan Note (Signed)
Continue lamotrigine, venlafaxine and clonazepam She plans to look for a therapist in [place]

## 2022-05-17 NOTE — Assessment & Plan Note (Signed)
Continue metoprolol, Eliquis and red yeast rice She will need to establish care with cardiology in [place]

## 2022-05-17 NOTE — Assessment & Plan Note (Signed)
Encourage weight loss as this can help reduce reflux symptoms Continue famotidine

## 2022-05-17 NOTE — Patient Instructions (Signed)

## 2022-05-17 NOTE — Assessment & Plan Note (Signed)
Continue levothyroxine 

## 2022-05-17 NOTE — Assessment & Plan Note (Signed)
Continue Seroquel She plans to establish care with psychiatry in [place]

## 2022-05-17 NOTE — Assessment & Plan Note (Signed)
Valacyclovir refilled today 

## 2022-05-17 NOTE — Assessment & Plan Note (Signed)
Encourage low-fat diet Continue red yeast rice

## 2022-05-17 NOTE — Assessment & Plan Note (Signed)
Encourage weight loss as this can help reduce joint pain Recommend Tylenol arthritis OTC

## 2022-05-17 NOTE — Assessment & Plan Note (Addendum)
Compensated Continue metoprolol, Entresto, spironolactone and torsemide Encourage DASH diet and exercise for weight loss Monitor daily weights She will need to establish care with cardiology in [place]

## 2022-05-17 NOTE — Progress Notes (Signed)
HPI  Patient presents to clinic today to establish care and for management of the conditions listed below.  HTN: Her BP today is 122/84.  She is taking Metoprolol, Entresto, Sprionolcatone and Torsemide as prescribed.  ECG from 03/2022 reviewed.  A-fib: Status post ablation and cardioversion.  Managed with Metoprolol and Eliquis.  ECG from 03/2022 reviewed.  She follows with cardiology.  CHF: She reports shortness of breath, but denies chronic cough, or lower extremity edema.  She is taking Jardiance, Metoprolol, Entresto, Sprionolcatone and Torsemide as prescribed.  Echo from 01/2020 reviewed.  Anxiety and Depression: Chronic, managed on Lamotrigine, Venlafaxine and Clonazepam.  She is not currently seeing a therapist but does see a psychiatrist.  She denies SI/HI.  Fibromyalgia: She reports chronic joint muscle pain.  She is Tizanidine taking as prescribed.  She does not follow with pain management.  GERD: She is not sure what triggers this.  She denies breakthrough on Famotidine.  There is no upper GI on file.  HLD with Aortic Atherosclerosis: Her last LDL was 120, triglycerides 85, 09/2018.  She is taking Red Yeast Rice OTC.  She prefers not to take statins. She does not consume a low-fat diet.  OSA: She averages  6 hours of sleep per night with use of her CPAP.  Sleep study from 05/2015 reviewed.  Insomnia: She has difficulty staying asleep.  She is taking Melatonin and Ambien as prescribed.  Sleep study from 05/2015 reviewed.  Hypothyroidism: She denies any issues on her current dose of Levothyroxine.  She does not follow with endocrinology.  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 She needs this refilled today.  Past Medical History:  Diagnosis Date   Arrhythmia    atrial fibrillation   Arthritis    CHF (congestive heart failure) (HCC)    Depression    Dyspnea    Dysrhythmia    Fibromyalgia 1992   GERD (gastroesophageal reflux disease)    Hepatitis    Hepatitis B  1985   Hyperlipidemia    Hypertension    Mitral valve prolapse    Pacemaker    Pacemaker 01/13/2021   Sleep apnea    CPAP   Tendon tear, ankle    rt foot   Thyroid dysfunction     Current Outpatient Medications  Medication Sig Dispense Refill   apixaban (ELIQUIS) 5 MG TABS tablet Take 5 mg by mouth every 12 (twelve) hours. 60 tablet 0   Ascorbic Acid (VITAMIN C) 1000 MG tablet Take 1,000 mg by mouth every evening.     B Complex-C (SUPER B COMPLEX PO) Take 1 tablet by mouth daily.     Boswellia-Glucosamine-Vit D (OSTEO BI-FLEX ONE PER DAY) TABS Take 1 tablet by mouth daily.     Calcium-Magnesium-Vitamin D (CALCIUM MAGNESIUM PO) Take 1 tablet by mouth daily.     clonazePAM (KLONOPIN) 0.5 MG tablet TAKE 0.5-1 TABLETS (0.25-0.5 MG TOTAL) BY MOUTH DAILY AS NEEDED FOR ANXIETY. PLEASE LIMIT USE 21 tablet 0   Coenzyme Q10 (CO Q 10) 100 MG CAPS Take 100 mg by mouth daily.     famotidine (PEPCID) 20 MG tablet Take 20 mg by mouth 2 (two) times daily.     JARDIANCE 10 MG TABS tablet TAKE 1 TABLET BY MOUTH DAILY BEFORE BREAKFAST. 30 tablet 5   lamoTRIgine (LAMICTAL) 25 MG tablet Take 1 tablet (25 mg total) by mouth daily. 90 tablet 0   levothyroxine (SYNTHROID) 150 MCG tablet Take 150 mcg by mouth daily before breakfast.  melatonin 5 MG TABS Take 5 mg by mouth at bedtime as needed (sleep).     metoprolol succinate (TOPROL-XL) 25 MG 24 hr tablet Take 12.5 mg by mouth daily.     Multiple Vitamins-Minerals (PRESERVISION AREDS 2 PO) Take 1 tablet by mouth daily.     potassium chloride (KLOR-CON) 10 MEQ tablet Take 20 mEq by mouth daily.     Red Yeast Rice Extract 600 MG CAPS Take 600 mg by mouth every evening.     sacubitril-valsartan (ENTRESTO) 24-26 MG Take 1 tablet by mouth 2 (two) times daily. 60 tablet 5   spironolactone (ALDACTONE) 25 MG tablet Take 1 tablet (25 mg total) by mouth every other day. 90 tablet 3   tiZANidine (ZANAFLEX) 2 MG tablet Take by mouth.     torsemide (DEMADEX) 100 MG  tablet TAKE 1/2 TABLET BY MOUTH DAILY 45 tablet 2   venlafaxine XR (EFFEXOR XR) 37.5 MG 24 hr capsule Take 1 capsule (37.5 mg total) by mouth daily with breakfast. 90 capsule 0   XIIDRA 5 % SOLN Apply 1 drop to eye 2 (two) times daily.     zolpidem (AMBIEN CR) 6.25 MG CR tablet TAKE 1 TABLET BY MOUTH AT BEDTIME AS NEEDED FOR SLEEP. 30 tablet 1   No current facility-administered medications for this visit.    Allergies  Allergen Reactions   Silver Other (See Comments)     Causes blisters. Causes blisters.   Tape Rash    Pads used for ablation left a rash for a couple of days. Pads used for ablation left a rash for a couple of days.    Family History  Problem Relation Age of Onset   Hypertension Mother    Alcohol abuse Father    Gastric cancer Father     Social History   Socioeconomic History   Marital status: Divorced    Spouse name: Not on file   Number of children: 0   Years of education: Not on file   Highest education level: Not on file  Occupational History   Occupation: retired     Comment: Astronomer   Tobacco Use   Smoking status: Former   Smokeless tobacco: Never   Tobacco comments:    quit 30 years ago  Scientific laboratory technician Use: Never used  Substance and Sexual Activity   Alcohol use: Yes    Comment: occassional glass of wine once a month   Drug use: Not Currently    Types: Marijuana   Sexual activity: Not Currently  Other Topics Concern   Not on file  Social History Narrative   Lives alone: 3 dogs and a Neurosurgeon    Social Determinants of Health   Financial Resource Strain: Not on file  Food Insecurity: Not on file  Transportation Needs: Not on file  Physical Activity: Not on file  Stress: Not on file  Social Connections: Not on file  Intimate Partner Violence: Not on file    ROS:  Constitutional: Denies fever, malaise, fatigue, headache or abrupt weight changes.  HEENT: Denies eye pain, eye redness, ear pain, ringing in the ears,  wax buildup, runny nose, nasal congestion, bloody nose, or sore throat. Respiratory: Pt reports intermittent shortness of breath. Denies difficulty breathing, cough or sputum production.   Cardiovascular: Denies chest pain, chest tightness, palpitations or swelling in the hands or feet.  Gastrointestinal: Denies abdominal pain, bloating, constipation, diarrhea or blood in the stool.  GU: Denies frequency, urgency, pain with urination,  blood in urine, odor or discharge. Musculoskeletal: Patient reports chronic muscle and joint pain, frequent falls.  Denies decrease in range of motion, difficulty with gait, or joint swelling.  Skin: Pt reports wound to right leg. Denies redness, rashes, or ulcercations.  Neurological: Patient reports insomnia.  Denies dizziness, difficulty with memory, difficulty with speech or problems with balance and coordination.  Psych: Patient has a history of anxiety and depression.  Denies SI/HI.  No other specific complaints in a complete review of systems (except as listed in HPI above).  PE: BP 122/84 (BP Location: Left Arm, Patient Position: Sitting, Cuff Size: Large)   Pulse 72   Temp (!) 96.9 F (36.1 C) (Temporal)   Ht '5\' 3"'$  (1.6 m)   Wt 235 lb (106.6 kg)   SpO2 98%   BMI 41.63 kg/m   Wt Readings from Last 3 Encounters:  05/16/22 235 lb 8 oz (106.8 kg)  03/02/22 227 lb 4 oz (103.1 kg)  11/30/21 230 lb (104.3 kg)    General: Appears her stated age, obese, in NAD. Skin: 2 cm scabbed area to right anterior shin with surrounding redness and warmth concerning for cellulitis. HEENT: Head: normal shape and size; Eyes: sclera white, no icterus, conjunctiva pink, PERRLA and EOMs intact;  Neck: Neck supple, trachea midline. No masses, lumps present.  Cardiovascular: Normal rate and rhythm. S1,S2 noted.  No murmur, rubs or gallops noted. Trace BLE edema. No carotid bruits noted. Pulmonary/Chest: Normal effort and positive vesicular breath sounds. No respiratory  distress. No wheezes, rales or ronchi noted.  Abdomen: Normal bowel sounds. Musculoskeletal: No difficulty with gait.  Neurological: Alert and oriented.  Psychiatric: Mood and affect normal.  Tearful.. Judgment and thought content normal.    BMET    Component Value Date/Time   NA 143 05/16/2022 1011   K 4.6 05/16/2022 1011   CL 107 05/16/2022 1011   CO2 28 05/16/2022 1011   GLUCOSE 97 05/16/2022 1011   BUN 26 (H) 05/16/2022 1011   CREATININE 0.60 05/16/2022 1011   CALCIUM 9.6 05/16/2022 1011   GFRNONAA >60 05/16/2022 1011   GFRAA >60 04/23/2020 1231    Lipid Panel  No results found for: "CHOL", "TRIG", "HDL", "CHOLHDL", "VLDL", "LDLCALC"  CBC    Component Value Date/Time   WBC 6.6 02/25/2021 0446   RBC 4.61 02/25/2021 0446   HGB 13.5 02/25/2021 0446   HCT 40.2 02/25/2021 0446   PLT 270 02/25/2021 0446   MCV 87.2 02/25/2021 0446   MCH 29.3 02/25/2021 0446   MCHC 33.6 02/25/2021 0446   RDW 13.7 02/25/2021 0446   LYMPHSABS 1.6 02/25/2021 0446   MONOABS 0.5 02/25/2021 0446   EOSABS 0.2 02/25/2021 0446   BASOSABS 0.0 02/25/2021 0446    Hgb A1C No results found for: "HGBA1C"   Assessment and Plan:  Infected Wound RLE:  Rx for Keflex 500 mg 3 times daily x7 days  She does not need to return as she is moving to [place], NP

## 2022-05-17 NOTE — Assessment & Plan Note (Signed)
Controlled on metoprolol, Entresto, spironolactone and torsemide Reinforced DASH diet and exercise for weight loss

## 2022-05-17 NOTE — Assessment & Plan Note (Signed)
Continue metoprolol and Eliquis She will need to establish care with cardiology in [place]

## 2022-05-17 NOTE — Assessment & Plan Note (Signed)
Encourage weight loss as this can help reduce sleep apnea symptoms Continue CPAP 

## 2022-05-17 NOTE — Assessment & Plan Note (Signed)
Encourage regular stretching and physical activity Encourage weight loss as this can reduce joint muscle pain Continue tizanidine as needed

## 2022-05-20 ENCOUNTER — Telehealth (INDEPENDENT_AMBULATORY_CARE_PROVIDER_SITE_OTHER): Payer: Self-pay | Admitting: Psychiatry

## 2022-05-20 ENCOUNTER — Encounter: Payer: Self-pay | Admitting: Psychiatry

## 2022-05-20 DIAGNOSIS — F418 Other specified anxiety disorders: Secondary | ICD-10-CM

## 2022-05-20 DIAGNOSIS — F331 Major depressive disorder, recurrent, moderate: Secondary | ICD-10-CM

## 2022-05-20 DIAGNOSIS — G4701 Insomnia due to medical condition: Secondary | ICD-10-CM

## 2022-05-20 NOTE — Progress Notes (Signed)
Virtual Visit via Telephone Note  I connected with Mandy Jones on 05/20/22 at 11:30 AM EDT by telephone and verified that I am speaking with the correct person using two identifiers.  Location Provider Location : [facility] Patient Location : Home  Participants: Patient , Provider    I discussed the limitations, risks, security and privacy concerns of performing an evaluation and management service by telephone and the availability of in person appointments. I also discussed with the patient that there may be a patient responsible charge related to this service. The patient expressed understanding and agreed to proceed.    I discussed the assessment and treatment plan with the patient. The patient was provided an opportunity to ask questions and all were answered. The patient agreed with the plan and demonstrated an understanding of the instructions.   The patient was advised to call back or seek an in-person evaluation if the symptoms worsen or if the condition fails to improve as anticipated.    [place] MD OP Progress Note  05/20/2022 12:37 PM Sharaya Boruff [facility]  MRN:  [id number]  Chief Complaint:  Chief Complaint  Patient presents with   Follow-up   Anxiety   Depression   HPI: Mandy Jones is a 71 year old Caucasian female, currently retired, lives in [place], has a history of MDD, multiple medical problems including bilateral carpal tunnel syndrome, fibromyalgia, chronic diastolic heart failure was evaluated by telephone today.  Patient today reports she was able to taper herself off of the Cymbalta.  Denies having any worsening mood symptoms or withdrawal symptoms.  She reports she is currently compliant on the venlafaxine.  Denies any side effects.  Patient reports she has not noticed much benefit from her medications yet since she is currently going through the move from [place] to [place].  She has movers coming in soon to assist.  She is planning to go  to [place] on Monday.  All this has been extremely stressful for her.  She however reports she likes the venlafaxine, and would like to stay on this dosage.  Denies any problems with her appetite.  Reports sleep is overall okay.  Denies any suicidality, homicidality or perceptual disturbances.  Planning to find a new therapist once she moves completely to [place].  Patient denies any other concerns today.  Visit Diagnosis:    ICD-10-CM   1. MDD (major depressive disorder), recurrent episode, moderate (HCC)  F33.1     2. Insomnia due to medical condition  G47.01    mood, being in diuretics, OSA    3. Other specified anxiety disorders  F41.8    Generalized anxiety not occurring more days than not      Past Psychiatric History: Reviewed past psychiatric history from progress note on 12/21/2021.  Past Medical History:  Past Medical History:  Diagnosis Date   Arrhythmia    atrial fibrillation   Arthritis    CHF (congestive heart failure) (HCC)    Depression    Dyspnea    Dysrhythmia    Fibromyalgia 1992   GERD (gastroesophageal reflux disease)    Hepatitis    Hepatitis B 1985   Hyperlipidemia    Hypertension    Mitral valve prolapse    Pacemaker    Pacemaker 01/13/2021   Sleep apnea    CPAP   Tendon tear, ankle    rt foot   Thyroid dysfunction     Past Surgical History:  Procedure Laterality Date   ACHILLES TENDON SURGERY Right 04/06/2018  Procedure: ACHILLES TENDON REPAIR;  Surgeon: [name], DPM;  Location: [facility];  Service: Podiatry;  Laterality: Right;   CARDIOVERSION N/A 02/06/2018   Procedure: CARDIOVERSION;  Surgeon: Corey Skains, MD;  Location: [facility];  Service: Cardiovascular;  Laterality: N/A;   CARDIOVERSION N/A 03/28/2018   Procedure: CARDIOVERSION;  Surgeon: Corey Skains, MD;  Location: [facility];  Service: Cardiovascular;  Laterality: N/A;   CARDIOVERSION N/A 05/09/2018   Procedure: CARDIOVERSION;  Surgeon: Corey Skains, MD;   Location: [facility];  Service: Cardiovascular;  Laterality: N/A;   CARDIOVERSION N/A 10/23/2019   Procedure: CARDIOVERSION;  Surgeon: Corey Skains, MD;  Location: [facility];  Service: Cardiovascular;  Laterality: N/A;   CARDIOVERSION N/A 09/30/2020   Procedure: CARDIOVERSION;  Surgeon: Corey Skains, MD;  Location: [facility];  Service: Cardiovascular;  Laterality: N/A;   CARDIOVERSION N/A 11/19/2020   Procedure: CARDIOVERSION;  Surgeon: Corey Skains, MD;  Location: [facility];  Service: Cardiovascular;  Laterality: N/A;   CARDIOVERSION N/A 03/16/2021   Procedure: CARDIOVERSION;  Surgeon: Corey Skains, MD;  Location: [facility];  Service: Cardiovascular;  Laterality: N/A;   CHOLECYSTECTOMY     CONTINUOUS NERVE MONITORING N/A 08/03/2017   Procedure: LARYNGEAL NERVE MONITORING;  Surgeon: Carloyn Manner, MD;  Location: [facility];  Service: ENT;  Laterality: N/A;   EXTRACORPOREAL SHOCK WAVE LITHOTRIPSY Right 02/13/2020   Procedure: EXTRACORPOREAL SHOCK WAVE LITHOTRIPSY (ESWL);  Surgeon: Billey Co, MD;  Location: [facility];  Service: Urology;  Laterality: Right;   EYE SURGERY Bilateral    FOOT SURGERY Left 2000   JOINT REPLACEMENT     left knee   OSTECTOMY Right 04/06/2018   Procedure: OSTECTOMY-HAGLUNDS/RECTROCALCANEAL;  Surgeon: [name], DPM;  Location: [facility];  Service: Podiatry;  Laterality: Right;   SHOULDER ARTHROSCOPY Bilateral    SHOULDER ARTHROSCOPY Left    THYROIDECTOMY N/A 08/03/2017   Procedure: THYROIDECTOMY;  Surgeon: Carloyn Manner, MD;  Location: [facility];  Service: ENT;  Laterality: N/A;   TONSILLECTOMY     TUBAL LIGATION      Family Psychiatric History: Reviewed family psychiatric history from progress note on 12/21/2021.  Family History:  Family History  Problem Relation Age of Onset   Hypertension Mother    Alcohol abuse Father    Gastric cancer Father     Social History: Reviewed social history from progress note on 12/21/2021. Social History    Socioeconomic History   Marital status: Divorced    Spouse name: Not on file   Number of children: 0   Years of education: Not on file   Highest education level: Not on file  Occupational History   Occupation: retired     Comment: Astronomer   Tobacco Use   Smoking status: Former   Smokeless tobacco: Never   Tobacco comments:    quit 30 years ago  Scientific laboratory technician Use: Never used  Substance and Sexual Activity   Alcohol use: Yes    Comment: occassional glass of wine once a month   Drug use: Not Currently    Types: Marijuana   Sexual activity: Not Currently  Other Topics Concern   Not on file  Social History Narrative   Lives alone: 3 dogs and a Neurosurgeon    Social Determinants of Health   Financial Resource Strain: Not on file  Food Insecurity: Not on file  Transportation Needs: Not on file  Physical Activity: Not on file  Stress: Not on file  Social  Connections: Not on file    Allergies:  Allergies  Allergen Reactions   Silver Other (See Comments)     Causes blisters. Causes blisters.   Tape Rash    Pads used for ablation left a rash for a couple of days. Pads used for ablation left a rash for a couple of days.    Metabolic Disorder Labs: No results found for: "HGBA1C", "MPG" No results found for: "PROLACTIN" No results found for: "CHOL", "TRIG", "HDL", "CHOLHDL", "VLDL", "LDLCALC" Lab Results  Component Value Date   TSH 9.326 (H) 02/09/2020   TSH 4.115 01/30/2018    Therapeutic Level Labs: No results found for: "LITHIUM" No results found for: "VALPROATE" No results found for: "CBMZ"  Current Medications: Current Outpatient Medications  Medication Sig Dispense Refill   apixaban (ELIQUIS) 5 MG TABS tablet Take 5 mg by mouth every 12 (twelve) hours. 60 tablet 0   Ascorbic Acid (VITAMIN C) 1000 MG tablet Take 1,000 mg by mouth every evening.     B Complex-C (SUPER B COMPLEX PO) Take 1 tablet by mouth daily.     Boswellia-Glucosamine-Vit  D (OSTEO BI-FLEX ONE PER DAY) TABS Take 1 tablet by mouth daily.     Calcium-Magnesium-Vitamin D (CALCIUM MAGNESIUM PO) Take 1 tablet by mouth daily.     cephALEXin (KEFLEX) 500 MG capsule Take 1 capsule (500 mg total) by mouth 3 (three) times daily. 21 capsule 0   clonazePAM (KLONOPIN) 0.5 MG tablet TAKE 0.5-1 TABLETS (0.25-0.5 MG TOTAL) BY MOUTH DAILY AS NEEDED FOR ANXIETY. PLEASE LIMIT USE 21 tablet 0   Coenzyme Q10 (CO Q 10) 100 MG CAPS Take 100 mg by mouth daily.     famotidine (PEPCID) 20 MG tablet Take 20 mg by mouth 2 (two) times daily.     JARDIANCE 10 MG TABS tablet TAKE 1 TABLET BY MOUTH DAILY BEFORE BREAKFAST. 30 tablet 5   lamoTRIgine (LAMICTAL) 25 MG tablet Take 1 tablet (25 mg total) by mouth daily. 90 tablet 0   levothyroxine (SYNTHROID) 150 MCG tablet Take 150 mcg by mouth daily before breakfast.     melatonin 5 MG TABS Take 5 mg by mouth at bedtime as needed (sleep).     metoprolol succinate (TOPROL-XL) 25 MG 24 hr tablet Take 12.5 mg by mouth daily.     Multiple Vitamins-Minerals (PRESERVISION AREDS 2 PO) Take 1 tablet by mouth daily.     potassium chloride (KLOR-CON) 10 MEQ tablet Take 20 mEq by mouth daily.     Red Yeast Rice Extract 600 MG CAPS Take 600 mg by mouth every evening.     sacubitril-valsartan (ENTRESTO) 24-26 MG Take 1 tablet by mouth 2 (two) times daily. 60 tablet 5   spironolactone (ALDACTONE) 25 MG tablet Take 1 tablet (25 mg total) by mouth every other day. 90 tablet 3   tiZANidine (ZANAFLEX) 2 MG tablet Take by mouth.     torsemide (DEMADEX) 100 MG tablet TAKE 1/2 TABLET BY MOUTH DAILY 45 tablet 2   valACYclovir (VALTREX) 1000 MG tablet 2 tablets twice a day as needed for an episode, only 4 tablets needed per breakout. 12 tablet 1   venlafaxine XR (EFFEXOR XR) 37.5 MG 24 hr capsule Take 1 capsule (37.5 mg total) by mouth daily with breakfast. 90 capsule 0   XIIDRA 5 % SOLN Apply 1 drop to eye 2 (two) times daily.     zolpidem (AMBIEN CR) 6.25 MG CR tablet  TAKE 1 TABLET BY MOUTH AT BEDTIME AS NEEDED  FOR SLEEP. 30 tablet 1   No current facility-administered medications for this visit.     Musculoskeletal: Strength & Muscle Tone:  UTA Gait & Station:  UTA Patient leans:  UTA  Psychiatric Specialty Exam: Review of Systems  Psychiatric/Behavioral:  Positive for dysphoric mood and sleep disturbance. The patient is nervous/anxious.   All other systems reviewed and are negative.   There were no vitals taken for this visit.There is no height or weight on file to calculate BMI.  General Appearance:  UTA  Eye Contact:   UTA  Speech:  Clear and Coherent  Volume:  Normal  Mood:  Anxious and Depressed  Affect:   UTA  Thought Process:  Goal Directed and Descriptions of Associations: Intact  Orientation:  Full (Time, Place, and Person)  Thought Content: Logical   Suicidal Thoughts:  No  Homicidal Thoughts:  No  Memory:  Immediate;   Fair Recent;   Fair Remote;   Fair  Judgement:  Fair  Insight:  Fair  Psychomotor Activity:   UTA  Concentration:  Concentration: Fair and Attention Span: Fair  Recall:  [employer] of Knowledge: Fair  Language: Fair  Akathisia:  No  Handed:  Right  AIMS (if indicated): not done  Assets:  Communication Skills Desire for Improvement Housing Social Support  ADL's:  Intact  Cognition: WNL  Sleep:   Restless due to being on diurectics   Screenings: [place] Office Visit from 03/16/2022 in [place] Office Visit from 02/16/2022 in [place] Total Score 0 0      GAD-7    [place] Visit from 05/03/2022 in [place] Visit from 03/16/2022 in [place] Office Visit from 03/02/2022 in [place] Office Visit from 02/16/2022 in [place] Office Visit from 12/21/2021 in [place]  Total GAD-7 Score '9 11 7 9 3      '$ PHQ2-9    [place] Office Visit from 05/03/2022 in [place] Office Visit from 03/16/2022 in [place] Office Visit from 03/02/2022 in [place] Office Visit from 02/16/2022 in [place] Office Visit from 12/21/2021 in [place]  PHQ-2 Total Score '3 5 6 4 6  '$ PHQ-9 Total Score '14 14 16 15 14      '$ Flowsheet Row Video Visit from 05/20/2022 in [place] Office Visit from 03/16/2022 in [place] Office Visit from 02/16/2022 in [place] No Risk No Risk No Risk        Assessment and Plan: Mandy Jones is a 71 year old Caucasian female, divorced, lives in [place], has a history of MDD, other specified anxiety disorder, insomnia, fibromyalgia, multiple medical problems was evaluated by phone today.  Patient could not connect by MyChart video due to connection problems.  Discussed plan as noted below.  Plan MDD-unstable Continue venlafaxine extended release 37.5 mg p.o. daily with breakfast Lamotrigine 25 mg p.o. daily Continue CBT, patient to establish care with a therapist in [place].  Other specified anxiety disorder-unstable Currently anxiety triggered by her recent psychosocial stressors of moving to [place]. Continue venlafaxine extended release 37.5 mg p.o. daily Klonopin 0.25-0.5 mg as needed for severe anxiety attacks Patient to limit use.  Insomnia-improving Continue sleep hygiene techniques.  Sleep  problems due to being on diuretic She does have Ambien CR 6.25 mg at bedtime as needed available.  Pending labs-platelet count, sodium level.  Follow-up in clinic as needed.   I have spent at least 12 minutes non face to face with  patient today .      Collaboration of Care: Collaboration of Care: Other patient advised to establish care with a new psychiatrist, primary care once she moves to [place].  Patient/Guardian was advised Release of Information must be obtained prior to any record release in order to collaborate their care with an outside provider. Patient/Guardian was advised if they have not already done so to contact the registration department to sign all necessary forms in order for [place] to release information regarding their care.   Consent: Patient/Guardian gives verbal consent for treatment and assignment of benefits for services provided during this visit. Patient/Guardian expressed understanding and agreed to proceed.   This note was generated in part or whole with voice recognition software. Voice recognition is usually quite accurate but there are transcription errors that can and very often do occur. I apologize for any typographical errors that were not detected and corrected.      Ursula Alert, MD 05/20/2022, 12:37 PM

## 2022-06-08 ENCOUNTER — Ambulatory Visit: Payer: HMO

## 2022-06-08 ENCOUNTER — Ambulatory Visit: Payer: Medicare HMO | Admitting: Family Medicine

## 2022-06-15 ENCOUNTER — Ambulatory Visit: Payer: Medicare HMO | Admitting: Family Medicine

## 2022-07-04 ENCOUNTER — Ambulatory Visit: Payer: HMO | Admitting: Family

## 2022-07-20 ENCOUNTER — Ambulatory Visit: Payer: Medicare HMO | Admitting: Urology

## 2022-08-03 ENCOUNTER — Other Ambulatory Visit: Payer: Self-pay | Admitting: Psychiatry

## 2022-08-03 DIAGNOSIS — F331 Major depressive disorder, recurrent, moderate: Secondary | ICD-10-CM

## 2022-08-03 DIAGNOSIS — G4701 Insomnia due to medical condition: Secondary | ICD-10-CM

## 2022-11-10 ENCOUNTER — Ambulatory Visit: Payer: Medicare HMO | Admitting: Urology
